# Patient Record
Sex: Male | Born: 1955
Health system: Southern US, Community
[De-identification: ages and names within clinical notes are randomized; demographics above are authoritative.]

## PROBLEM LIST (undated history)

## (undated) DIAGNOSIS — I2510 Atherosclerotic heart disease of native coronary artery without angina pectoris: Secondary | ICD-10-CM

## (undated) DIAGNOSIS — Z973 Presence of spectacles and contact lenses: Secondary | ICD-10-CM

## (undated) DIAGNOSIS — I509 Heart failure, unspecified: Secondary | ICD-10-CM

## (undated) DIAGNOSIS — Z95810 Presence of automatic (implantable) cardiac defibrillator: Secondary | ICD-10-CM

## (undated) DIAGNOSIS — E669 Obesity, unspecified: Secondary | ICD-10-CM

## (undated) DIAGNOSIS — E039 Hypothyroidism, unspecified: Secondary | ICD-10-CM

## (undated) DIAGNOSIS — I272 Pulmonary hypertension, unspecified: Secondary | ICD-10-CM

## (undated) DIAGNOSIS — J189 Pneumonia, unspecified organism: Secondary | ICD-10-CM

## (undated) DIAGNOSIS — I513 Intracardiac thrombosis, not elsewhere classified: Secondary | ICD-10-CM

## (undated) DIAGNOSIS — I5042 Chronic combined systolic (congestive) and diastolic (congestive) heart failure: Secondary | ICD-10-CM

## (undated) DIAGNOSIS — I472 Ventricular tachycardia: Secondary | ICD-10-CM

## (undated) DIAGNOSIS — K08109 Complete loss of teeth, unspecified cause, unspecified class: Secondary | ICD-10-CM

## (undated) DIAGNOSIS — Z972 Presence of dental prosthetic device (complete) (partial): Secondary | ICD-10-CM

## (undated) DIAGNOSIS — I255 Ischemic cardiomyopathy: Secondary | ICD-10-CM

## (undated) DIAGNOSIS — E785 Hyperlipidemia, unspecified: Secondary | ICD-10-CM

## (undated) DIAGNOSIS — K429 Umbilical hernia without obstruction or gangrene: Secondary | ICD-10-CM

## (undated) DIAGNOSIS — I482 Chronic atrial fibrillation, unspecified: Secondary | ICD-10-CM

## (undated) DIAGNOSIS — I4729 Other ventricular tachycardia: Secondary | ICD-10-CM

## (undated) DIAGNOSIS — I340 Nonrheumatic mitral (valve) insufficiency: Secondary | ICD-10-CM

## (undated) DIAGNOSIS — I447 Left bundle-branch block, unspecified: Secondary | ICD-10-CM

## (undated) HISTORY — PX: MULTIPLE TOOTH EXTRACTIONS: SHX2053

## (undated) HISTORY — PX: FRACTURE SURGERY: SHX138

---
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
## 2012-05-20 HISTORY — PX: CORONARY ARTERY BYPASS GRAFT: SHX141

## 2013-03-20 DIAGNOSIS — I5042 Chronic combined systolic (congestive) and diastolic (congestive) heart failure: Secondary | ICD-10-CM

## 2013-03-20 HISTORY — PX: CORONARY ARTERY BYPASS GRAFT: SHX141

## 2013-03-20 HISTORY — PX: CARDIAC CATHETERIZATION: SHX172

## 2013-03-20 HISTORY — DX: Chronic combined systolic (congestive) and diastolic (congestive) heart failure: I50.42

## 2014-03-01 ENCOUNTER — Emergency Department (HOSPITAL_COMMUNITY): Payer: Medicaid Other

## 2014-03-01 ENCOUNTER — Encounter (HOSPITAL_COMMUNITY): Payer: Self-pay | Admitting: Emergency Medicine

## 2014-03-01 ENCOUNTER — Inpatient Hospital Stay (HOSPITAL_COMMUNITY)
Admission: EM | Admit: 2014-03-01 | Discharge: 2014-03-06 | DRG: 292 | Disposition: A | Discharge status: Home / Self Care | Payer: Medicaid Other | Attending: Cardiovascular Disease | Admitting: Cardiovascular Disease

## 2014-03-01 DIAGNOSIS — I2510 Atherosclerotic heart disease of native coronary artery without angina pectoris: Secondary | ICD-10-CM | POA: Diagnosis present

## 2014-03-01 DIAGNOSIS — Z951 Presence of aortocoronary bypass graft: Secondary | ICD-10-CM

## 2014-03-01 DIAGNOSIS — I272 Other secondary pulmonary hypertension: Secondary | ICD-10-CM | POA: Diagnosis present

## 2014-03-01 DIAGNOSIS — I255 Ischemic cardiomyopathy: Secondary | ICD-10-CM | POA: Diagnosis present

## 2014-03-01 DIAGNOSIS — I5189 Other ill-defined heart diseases: Secondary | ICD-10-CM | POA: Diagnosis present

## 2014-03-01 DIAGNOSIS — I4729 Other ventricular tachycardia: Secondary | ICD-10-CM

## 2014-03-01 DIAGNOSIS — R001 Bradycardia, unspecified: Secondary | ICD-10-CM | POA: Diagnosis not present

## 2014-03-01 DIAGNOSIS — I959 Hypotension, unspecified: Secondary | ICD-10-CM | POA: Diagnosis not present

## 2014-03-01 DIAGNOSIS — I447 Left bundle-branch block, unspecified: Secondary | ICD-10-CM | POA: Diagnosis present

## 2014-03-01 DIAGNOSIS — Z7982 Long term (current) use of aspirin: Secondary | ICD-10-CM | POA: Diagnosis not present

## 2014-03-01 DIAGNOSIS — R011 Cardiac murmur, unspecified: Secondary | ICD-10-CM | POA: Diagnosis present

## 2014-03-01 DIAGNOSIS — I5043 Acute on chronic combined systolic (congestive) and diastolic (congestive) heart failure: Principal | ICD-10-CM | POA: Diagnosis present

## 2014-03-01 DIAGNOSIS — Z7901 Long term (current) use of anticoagulants: Secondary | ICD-10-CM

## 2014-03-01 DIAGNOSIS — I472 Ventricular tachycardia: Secondary | ICD-10-CM | POA: Diagnosis present

## 2014-03-01 DIAGNOSIS — F17210 Nicotine dependence, cigarettes, uncomplicated: Secondary | ICD-10-CM | POA: Diagnosis present

## 2014-03-01 DIAGNOSIS — I513 Intracardiac thrombosis, not elsewhere classified: Secondary | ICD-10-CM

## 2014-03-01 DIAGNOSIS — E785 Hyperlipidemia, unspecified: Secondary | ICD-10-CM

## 2014-03-01 DIAGNOSIS — Z720 Tobacco use: Secondary | ICD-10-CM | POA: Diagnosis present

## 2014-03-01 DIAGNOSIS — M7989 Other specified soft tissue disorders: Secondary | ICD-10-CM | POA: Diagnosis present

## 2014-03-01 DIAGNOSIS — R0602 Shortness of breath: Secondary | ICD-10-CM

## 2014-03-01 DIAGNOSIS — I509 Heart failure, unspecified: Secondary | ICD-10-CM

## 2014-03-01 DIAGNOSIS — I236 Thrombosis of atrium, auricular appendage, and ventricle as current complications following acute myocardial infarction: Secondary | ICD-10-CM

## 2014-03-01 DIAGNOSIS — I340 Nonrheumatic mitral (valve) insufficiency: Secondary | ICD-10-CM | POA: Diagnosis present

## 2014-03-01 HISTORY — DX: Left bundle-branch block, unspecified: I44.7

## 2014-03-01 HISTORY — DX: Intracardiac thrombosis, not elsewhere classified: I51.3

## 2014-03-01 HISTORY — DX: Pulmonary hypertension, unspecified: I27.20

## 2014-03-01 HISTORY — DX: Heart failure, unspecified: I50.9

## 2014-03-01 HISTORY — DX: Obesity, unspecified: E66.9

## 2014-03-01 HISTORY — DX: Chronic combined systolic (congestive) and diastolic (congestive) heart failure: I50.42

## 2014-03-01 HISTORY — DX: Hyperlipidemia, unspecified: E78.5

## 2014-03-01 HISTORY — DX: Atherosclerotic heart disease of native coronary artery without angina pectoris: I25.10

## 2014-03-01 HISTORY — DX: Ventricular tachycardia: I47.2

## 2014-03-01 HISTORY — DX: Other disorders of bilirubin metabolism: E80.6

## 2014-03-01 HISTORY — DX: Other ventricular tachycardia: I47.29

## 2014-03-01 HISTORY — DX: Ischemic cardiomyopathy: I25.5

## 2014-03-01 HISTORY — DX: Nonrheumatic mitral (valve) insufficiency: I34.0

## 2014-03-01 HISTORY — DX: Umbilical hernia without obstruction or gangrene: K42.9

## 2014-03-01 LAB — COMPREHENSIVE METABOLIC PANEL
ALBUMIN: 2.9 g/dL — AB (ref 3.5–5.2)
ALK PHOS: 104 U/L (ref 39–117)
ALT: 21 U/L (ref 0–53)
AST: 33 U/L (ref 0–37)
Anion gap: 10 (ref 5–15)
BUN: 17 mg/dL (ref 6–23)
CHLORIDE: 100 meq/L (ref 96–112)
CO2: 26 mEq/L (ref 19–32)
Calcium: 8.9 mg/dL (ref 8.4–10.5)
Creatinine, Ser: 0.91 mg/dL (ref 0.50–1.35)
GFR calc Af Amer: >90 mL/min (ref >90)
Glucose, Bld: 196 mg/dL — ABNORMAL HIGH (ref 70–99)
Potassium: 4.4 mEq/L (ref 3.7–5.3)
SODIUM: 136 meq/L — AB (ref 137–147)
Total Bilirubin: 1.7 mg/dL — ABNORMAL HIGH (ref 0.3–1.2)
Total Protein: 7.5 g/dL (ref 6.0–8.3)

## 2014-03-01 LAB — CBC
HCT: 46.8 % (ref 39.0–52.0)
HEMATOCRIT: 49 % (ref 39.0–52.0)
HEMOGLOBIN: 15.7 g/dL (ref 13.0–17.0)
Hemoglobin: 15.3 g/dL (ref 13.0–17.0)
MCH: 28.9 pg (ref 26.0–34.0)
MCH: 29.1 pg (ref 26.0–34.0)
MCHC: 32 g/dL (ref 30.0–36.0)
MCHC: 32.7 g/dL (ref 30.0–36.0)
MCV: 90.2 fL (ref 78.0–100.0)
MCV: 89 fL (ref 78.0–100.0)
PLATELETS: 142 10*3/uL — AB (ref 150–400)
Platelets: 145 10*3/uL — ABNORMAL LOW (ref 150–400)
RBC: 5.43 MIL/uL (ref 4.22–5.81)
RBC: 5.26 MIL/uL (ref 4.22–5.81)
RDW: 17.4 % — ABNORMAL HIGH (ref 11.5–15.5)
RDW: 17.2 % — AB (ref 11.5–15.5)
WBC: 8.4 10*3/uL (ref 4.0–10.5)
WBC: 9 10*3/uL (ref 4.0–10.5)

## 2014-03-01 LAB — CREATININE, SERUM
Creatinine, Ser: 0.97 mg/dL (ref 0.50–1.35)
GFR calc non Af Amer: 89 mL/min — ABNORMAL LOW (ref >90)

## 2014-03-01 LAB — I-STAT TROPONIN, ED: Troponin i, poc: 0.04 ng/mL (ref 0.00–0.08)

## 2014-03-01 LAB — TROPONIN I

## 2014-03-01 LAB — PRO B NATRIURETIC PEPTIDE: Pro B Natriuretic peptide (BNP): 1789 pg/mL — ABNORMAL HIGH (ref 0–125)

## 2014-03-01 LAB — POTASSIUM: Potassium: 3.6 mEq/L — ABNORMAL LOW (ref 3.7–5.3)

## 2014-03-01 MED ORDER — SODIUM CHLORIDE 0.9 % IV SOLN: 250.0000 mL | INTRAVENOUS | Status: DC | PRN

## 2014-03-01 MED ORDER — HEPARIN SODIUM (PORCINE) 5000 UNIT/ML IJ SOLN
5000.0000 [IU] | Freq: Three times a day (TID) | INTRAMUSCULAR | Status: DC
  Administered 2014-03-01 – 2014-03-03 (×8): 5000 [IU] via SUBCUTANEOUS
  Filled 2014-03-01 (×10): qty 1

## 2014-03-01 MED ORDER — ATORVASTATIN CALCIUM 40 MG PO TABS
40.0000 mg | ORAL_TABLET | Freq: Every day | ORAL | Status: DC
  Administered 2014-03-01 – 2014-03-05 (×5): 40 mg via ORAL
  Filled 2014-03-01 (×7): qty 1

## 2014-03-01 MED ORDER — DEXTROSE 5 % IV SOLN
5.0000 mg/h | INTRAVENOUS | Status: DC
  Administered 2014-03-01: 8 mg/h via INTRAVENOUS
  Administered 2014-03-02: 5 mg/h via INTRAVENOUS
  Filled 2014-03-01 (×5): qty 25

## 2014-03-01 MED ORDER — ACETAMINOPHEN 325 MG PO TABS: 650.0000 mg | ORAL_TABLET | ORAL | Status: DC | PRN

## 2014-03-01 MED ORDER — SODIUM CHLORIDE 0.9 % IJ SOLN: 3.0000 mL | INTRAMUSCULAR | Status: DC | PRN

## 2014-03-01 MED ORDER — LISINOPRIL 2.5 MG PO TABS
2.5000 mg | ORAL_TABLET | Freq: Every day | ORAL | Status: DC
  Administered 2014-03-01 – 2014-03-02 (×2): 2.5 mg via ORAL
  Filled 2014-03-01 (×4): qty 1

## 2014-03-01 MED ORDER — ONDANSETRON HCL 4 MG/2ML IJ SOLN: 4.0000 mg | Freq: Four times a day (QID) | INTRAMUSCULAR | Status: DC | PRN

## 2014-03-01 MED ORDER — CARVEDILOL 3.125 MG PO TABS
3.1250 mg | ORAL_TABLET | Freq: Two times a day (BID) | ORAL | Status: DC
  Administered 2014-03-01 – 2014-03-06 (×8): 3.125 mg via ORAL
  Filled 2014-03-01 (×14): qty 1

## 2014-03-01 MED ORDER — POTASSIUM CHLORIDE CRYS ER 20 MEQ PO TBCR
20.0000 meq | EXTENDED_RELEASE_TABLET | Freq: Two times a day (BID) | ORAL | Status: DC
  Administered 2014-03-01 – 2014-03-03 (×6): 20 meq via ORAL
  Filled 2014-03-01 (×8): qty 1

## 2014-03-01 MED ORDER — ASPIRIN EC 81 MG PO TBEC
81.0000 mg | DELAYED_RELEASE_TABLET | Freq: Every day | ORAL | Status: DC
  Administered 2014-03-01 – 2014-03-06 (×5): 81 mg via ORAL
  Filled 2014-03-01 (×7): qty 1

## 2014-03-01 MED ORDER — FUROSEMIDE 10 MG/ML IJ SOLN
80.0000 mg | Freq: Once | INTRAMUSCULAR | Status: AC
  Administered 2014-03-01: 80 mg via INTRAVENOUS
  Filled 2014-03-01: qty 8

## 2014-03-01 MED ORDER — SODIUM CHLORIDE 0.9 % IJ SOLN
3.0000 mL | Freq: Two times a day (BID) | INTRAMUSCULAR | Status: DC
  Administered 2014-03-01 – 2014-03-06 (×5): 3 mL via INTRAVENOUS

## 2014-03-01 NOTE — ED Notes (Signed)
Pt to vascular.

## 2014-03-01 NOTE — ED Notes (Signed)
Pt recently moved here from [place]. Hx of CHF and CABG. Pt has increase in SOB and bilateral leg swelling for the past 2-3 weeks. Denies any pain.

## 2014-03-01 NOTE — ED Provider Notes (Addendum)
CSN: [id number]     Arrival date & time 03/01/14  [id number] History   First MD Initiated Contact with Patient 03/01/14 [phone]     Chief Complaint  Patient presents with  . Leg Swelling  . Shortness of Breath      HPI Patient underwent coronary artery bypass grafting in [place] November 2014.  He presents now with worsening exertional shortness of breath and associated orthopnea as well as bilateral lower extremity edema.  He is not currently on any medications.  Shortly [place] his [place] and has not established care with a primary care physician or cardiologist.  He states over the past several weeks his breathing has become worse and his leg swelling has become worse.  He states last night he had to sleep upright secondary to shortness of breath.  His daughter reports that he sounds labored when speaking with him on the phone.  His symptoms are moderate to severe in severity.  No fevers or chills.  No productive cough.  No history of DVT or pulmonary embolism   Past Medical History  Diagnosis Date  . CHF (congestive heart failure)   . Obesity   . Pulmonary hypertension   . Hyperbilirubinemia    Past Surgical History  Procedure Laterality Date  . Coronary artery bypass graft     No family history on file. History  Substance Use Topics  . Smoking status: Current Some Day Smoker  . Smokeless tobacco: Not on file  . Alcohol Use: No    Review of Systems  All other systems reviewed and are negative.     Allergies  Review of patient's allergies indicates no known allergies.  Home Medications   Prior to Admission medications   Medication Sig Start Date End Date Taking? Authorizing Provider  aspirin EC 81 MG tablet Take 81 mg by mouth daily.   Yes Historical Provider, MD  Multiple Vitamins-Minerals (HAIR/SKIN/NAILS PO) Take 1 tablet by mouth daily.   Yes Historical Provider, MD  VITAMIN E PO Take 1 tablet by mouth daily.   Yes Historical Provider, MD   BP  131/88  Pulse 93  Temp(Src) 97.4 F (36.3 C) (Oral)  Resp 24  Ht 5\' 10"  (1.778 m)  Wt 220 lb (99.791 kg)  BMI 31.57 kg/m2  SpO2 97% Physical Exam  Nursing note and vitals reviewed. Constitutional: He is oriented to person, place, and time. He appears well-developed and well-nourished.  HENT:  Head: Normocephalic and atraumatic.  Eyes: EOM are normal.  Neck: Normal range of motion.  Cardiovascular: Normal rate, regular rhythm, normal heart sounds and intact distal pulses.   Pulmonary/Chest: Effort normal and breath sounds normal. No respiratory distress.  Abdominal: Soft. He exhibits no distension. There is no tenderness.  Easily reducible periumbilical hernia.  Musculoskeletal: Normal range of motion.  2+ pitting edema bilaterally.  Normal pulses in bilateral feet  Neurological: He is alert and oriented to person, place, and time.  Skin: Skin is warm and dry.  Psychiatric: He has a normal mood and affect. Judgment normal.    ED Course  Procedures (including critical care time) Labs Review Labs Reviewed  CBC - Abnormal; Notable for the following:    RDW 17.4 (*)    Platelets 142 (*)    All other components within normal limits  PRO B NATRIURETIC PEPTIDE - Abnormal; Notable for the following:    Pro B Natriuretic peptide (BNP) 1789.0 (*)    All other components within normal limits  COMPREHENSIVE METABOLIC PANEL - Abnormal; Notable for the following:    Sodium 136 (*)    Glucose, Bld 196 (*)    Albumin 2.9 (*)    Total Bilirubin 1.7 (*)    All other components within normal limits  TROPONIN I  Rosezena SensorI-STAT TROPOININ, ED    Imaging Review Dg Chest 2 View  03/01/2014   CLINICAL DATA:  58 year old male with acute shortness of breath and bilateral lower extremity swelling. Initial encounter. Personal history of 2014 CABG.  EXAM: CHEST  2 VIEW  COMPARISON:  None.  FINDINGS: Cardiomegaly. Small bilateral pleural effusions. Pulmonary vascular congestion with suggestion of Kerley  B-lines. No pneumothorax or consolidation. Large lung volumes. Visualized tracheal air column is within normal limits. No acute osseous abnormality identified.  IMPRESSION: Cardiomegaly with small pleural effusions and interstitial edema.   Electronically Signed   By: Augusto GambleLee  Hall M.D.   On: 03/01/2014 09:34     EKG Interpretation   Date/Time:  Tuesday March 01 2014 08:59:17 EDT Ventricular Rate:  95 PR Interval:  182 QRS Duration: 122 QT Interval:  416 QTC Calculation: 522 R Axis:   158 Text Interpretation:  Undetermined rhythm Septal infarct , age  undetermined Lateral infarct , age undetermined T wave abnormality,  consider inferior ischemia Abnormal ECG No old tracing to compare repeat  ecg necessary given artifact Confirmed by Lyndy Russman  MD, Caryn BeeKEVIN ([id number]) on  03/01/2014 9:26:19 AM       ECG interpretation 1017   Date: 03/01/2014  Rate: 81  Rhythm: normal sinus rhythm  QRS Axis: normal  Intervals: normal  ST/T Wave abnormalities: Nonspecific ST and T wave changes  Conduction Disutrbances:LBBB  Narrative Interpretation:   Old EKG Reviewed: no prior ecg available     MDM   Final diagnoses:  Acute on chronic congestive heart failure, unspecified congestive heart failure type    Paperwork in the patient's room demonstrate echocardiogram from November 2014 demonstrating severe hypokinesis and an EF of 19%.  There is also report of cirrhosis and ascites based on abdominal ultrasound.  Patient does not drink alcohol.  Patient continues to smoke cigarettes.  I suspect this is acute on chronic congestive heart failure.  Cardiology consult.    Lyanne CoKevin M Berneice Zettlemoyer, MD 03/01/14 1014  Lyanne CoKevin M Gray Maugeri, MD 03/01/14 1051

## 2014-03-01 NOTE — Progress Notes (Signed)
*  PRELIMINARY RESULTS* Vascular Ultrasound Lower extremity venous duplex has been completed.  Preliminary findings: no evidence of DVT  Farrel DemarkJill Eunice, RDMS, RVT  03/01/2014, 11:54 AM

## 2014-03-01 NOTE — Care Management Note (Addendum)
    Page 1 of 2   03/05/2014     10:23:49 AM CARE MANAGEMENT NOTE 03/05/2014  Patient:  Joseph Carson,Joseph Carson   Account Number:  [id number]  Date Initiated:  03/01/2014  Documentation initiated by:  Fuller Mandril  Subjective/Objective Assessment:   58 yo male presents with shortness of breath, orthopnea, lower extremity edema.     Action/Plan:   furosemide (LASIX) 250 mg in dextrose 5 % 250 mL (1 mg/mL) infusion//Access for disposition needs   Anticipated DC Date:  03/06/2014   Anticipated DC Plan:  [place]  CM consult  Medication Assistance  [place] Program      Choice offered to / List presented to:             Status of service:  Completed, signed off Medicare Important Message given?   (If response is "NO", the following Medicare IM given date fields will be blank) Date Medicare IM given:   Medicare IM given by:   Date Additional Medicare IM given:   Additional Medicare IM given by:    Discharge Disposition:  HOME/SELF CARE  Per UR Regulation:  Reviewed for med. necessity/level of care/duration of stay  If discussed at [place] of Stay Meetings, dates discussed:    Comments:  03/05/14 10:10 CM met with pt in room to give him [facility] letter and list of participating pharmacies.Pt verbalized understanding of MATCH parameters.  CM also gave pt [place] pamphlet and pt verbalized understanding he is to go to the CLINIC any weekday morning from 9-10 and ask for: AN APPOINTMENT OF FOR A PCP; AN APPOINTMENT WITH A NAVIGATOR FOR INSURANCE/MEDICAID; and AN APPOINTMENT FOR FOLLOW UP CARE.  No other CM needs were communicated.  Mariane Masters, BSN, [place] [phone].  03/04/14 [place], RN, BSN, [place] [phone] Hand-off to weekend NCM to provide [facility] letter on day of discharge.  03/01/14 [place], RN, BSN, [place] [phone] 58 yo male who moved from [place] about 10 months ago to be closer to family.  He works bending tubes for neon signs. He  has not had any insurance for several months and when he ran out of his medications in Feb, he never got any more.

## 2014-03-01 NOTE — ED Notes (Signed)
Pt remains in xray.

## 2014-03-01 NOTE — H&P (Signed)
Joseph Carson is an 59 y.o. male.   Chief Complaint:  Leg swelling HPI:   The patient is a 58 yo male who moved from [place] about 10 months ago to be closer to family.  He works bending tubes for neon signs.  He has not had any insurance for several months and when he ran out of his medications in Feb, he never got any more.  Has prior history of noncompliance as well.  He has a history of CAD with CABG x 3 in November 2014 (LIMA to the LAD, SVG to the ramus intermedius, SVG to the PDA), ischemic cardiomyopathy with an EF of 19% by echo 03/22/13,  CHF, obesity, pulmonary HTN, hyperbilirubinemia, left knee surgery.    His cath report 03/24/2013 from [facility]:  left main with 70% distal stenosis, LAD 100% stenosis after the origin of a widely patent first diagonal, 100% stenosis in the proximal left circumflex , 100 percent stenosis in the proximal right coronary artery.  There was also a large, widely patent vessel arising from the right coronary sinus that may represent a right ventricular branch supplying collateral circulation to the LAD.  He presents with shortness of breath, orthopnea, lower extremity edema.  He currently denies nausea, vomiting, fever, chest pain, dizziness, cough, congestion, abdominal pain, hematochezia, melena, claudication.   Past Medical History  Diagnosis Date  . CHF (congestive heart failure)   . Obesity   . Pulmonary hypertension   . Hyperbilirubinemia     Past Surgical History  Procedure Laterality Date  . Coronary artery bypass graft      No family history on file. Social History:  reports that he has been smoking.  He does not have any smokeless tobacco history on file. He reports that he does not drink alcohol or use illicit drugs.  Allergies: No Known Allergies   (Not in a hospital admission)  Results for orders placed during the hospital encounter of 03/01/14 (from the past 48 hour(s))  CBC     Status: Abnormal   Collection Time      03/01/14  9:14 AM      Result Value Ref Range   WBC 8.4  4.0 - 10.5 K/uL   RBC 5.43  4.22 - 5.81 MIL/uL   Hemoglobin 15.7  13.0 - 17.0 g/dL   HCT 49.0  39.0 - 52.0 %   MCV 90.2  78.0 - 100.0 fL   MCH 28.9  26.0 - 34.0 pg   MCHC 32.0  30.0 - 36.0 g/dL   RDW 17.4 (*) 11.5 - 15.5 %   Platelets 142 (*) 150 - 400 K/uL  PRO B NATRIURETIC PEPTIDE     Status: Abnormal   Collection Time    03/01/14  9:14 AM      Result Value Ref Range   Pro B Natriuretic peptide (BNP) 1789.0 (*) 0 - 125 pg/mL  TROPONIN I     Status: None   Collection Time    03/01/14  9:14 AM      Result Value Ref Range   Troponin I <0.30  <0.30 ng/mL   Comment:            Due to the release kinetics of cTnI,     a negative result within the first hours     of the onset of symptoms does not rule out     myocardial infarction with certainty.     If myocardial infarction is still suspected,  repeat the test at appropriate intervals.  COMPREHENSIVE METABOLIC PANEL     Status: Abnormal   Collection Time    03/01/14  9:14 AM      Result Value Ref Range   Sodium 136 (*) 137 - 147 mEq/L   Potassium 4.4  3.7 - 5.3 mEq/L   Chloride 100  96 - 112 mEq/L   CO2 26  19 - 32 mEq/L   Glucose, Bld 196 (*) 70 - 99 mg/dL   BUN 17  6 - 23 mg/dL   Creatinine, Ser 0.91  0.50 - 1.35 mg/dL   Calcium 8.9  8.4 - 10.5 mg/dL   Total Protein 7.5  6.0 - 8.3 g/dL   Albumin 2.9 (*) 3.5 - 5.2 g/dL   AST 33  0 - 37 U/L   ALT 21  0 - 53 U/L   Alkaline Phosphatase 104  39 - 117 U/L   Total Bilirubin 1.7 (*) 0.3 - 1.2 mg/dL   GFR calc non Af Amer >90  >90 mL/min   GFR calc Af Amer >90  >90 mL/min   Comment: (NOTE)     The eGFR has been calculated using the CKD EPI equation.     This calculation has not been validated in all clinical situations.     eGFR's persistently <90 mL/min signify possible Chronic Kidney     Disease.   Anion gap 10  5 - 15  I-STAT TROPOININ, ED     Status: None   Collection Time    03/01/14  9:30 AM       Result Value Ref Range   Troponin i, poc 0.04  0.00 - 0.08 ng/mL   Comment 3            Comment: Due to the release kinetics of cTnI,     a negative result within the first hours     of the onset of symptoms does not rule out     myocardial infarction with certainty.     If myocardial infarction is still suspected,     repeat the test at appropriate intervals.   Dg Chest 2 View  03/01/2014   CLINICAL DATA:  58-year-old male with acute shortness of breath and bilateral lower extremity swelling. Initial encounter. Personal history of 2014 CABG.  EXAM: CHEST  2 VIEW  COMPARISON:  None.  FINDINGS: Cardiomegaly. Small bilateral pleural effusions. Pulmonary vascular congestion with suggestion of Kerley B-lines. No pneumothorax or consolidation. Large lung volumes. Visualized tracheal air column is within normal limits. No acute osseous abnormality identified.  IMPRESSION: Cardiomegaly with small pleural effusions and interstitial edema.   Electronically Signed   By: Lee  Hall M.D.   On: 03/01/2014 09:34    Review of Systems  Constitutional: Negative for fever and diaphoresis.  HENT: Negative for congestion and sore throat.   Respiratory: Positive for shortness of breath. Negative for cough and wheezing.   Cardiovascular: Positive for orthopnea, leg swelling and PND. Negative for chest pain, palpitations and claudication.  Gastrointestinal: Negative for nausea, vomiting, abdominal pain, blood in stool and melena.  Genitourinary: Negative for hematuria.  Musculoskeletal: Negative for myalgias.  Neurological: Negative for dizziness.  All other systems reviewed and are negative.   Blood pressure 107/68, pulse 80, temperature 97.4 F (36.3 C), temperature source Oral, resp. rate 16, height 5' 10" (1.778 m), weight 220 lb (99.791 kg), SpO2 97.00%. Physical Exam  Nursing note and vitals reviewed. Constitutional: He is oriented to person, place, and time.   He appears well-developed and  well-nourished. No distress.  HENT:  Head: Normocephalic and atraumatic.  Mouth/Throat: No oropharyngeal exudate.  Eyes: EOM are normal. Pupils are equal, round, and reactive to light. No scleral icterus.  Neck: Normal range of motion. Neck supple. JVD present.  Cardiovascular: Normal rate, regular rhythm, S1 normal and S2 normal.   No murmur heard. Pulses:      Radial pulses are 2+ on the right side, and 2+ on the left side.       Dorsalis pedis pulses are 2+ on the right side, and 2+ on the left side.  No carotid bruits.  Respiratory: Effort normal. He has wheezes (right side). He has rales.  GI: Soft. Bowel sounds are normal. He exhibits distension. There is no tenderness.  Musculoskeletal: He exhibits edema.  Anasarca  Lymphadenopathy:    He has no cervical adenopathy.  Neurological: He is alert and oriented to person, place, and time. He exhibits normal muscle tone.  Skin: Skin is warm and dry.  Psychiatric: He has a normal mood and affect.     Assessment/Plan Principal Problem:   Acute on chronic combined systolic and diastolic heart failure Active Problems:   Coronary artery disease   Ischemic cardiomyopathy ef 19%   Tobacco abuse   Pulmonary hypertension   Status post coronary artery bypass grafting x3, November 2014  Plan:  Patient will be admitted to telemetry. He is likely 30-40 pounds overweight volume. We will start him on Lasix drip. We'll add low dose ACE inhibitor 2.5 mg. Coreg 3.125 twice daily.  Continue to monitor blood pressure. We'll also add twice daily potassium. Check 2-D echocardiogram. Consider adding spironolactone if blood pressure will tolerate.    Tarri Fuller, [place] 03/01/2014, 12:26 PM   Patient seen and examined. Agree with assessment and plan.  Joseph Carson is a 58 year old Caucasian male with a history of ischemic cardiomyopathy initially documented in November 2014.  At that time, and he had an echo Doppler study, which revealed an ejection  fraction of 19% , and due to the severe CAD with 70% left main stenosis, total occlusion of the LAD after the diagonal vessel, total occlusion of the circumflex and right coronary artery he underwent CABG revascularization surgery x3 with LIMA to the LAD, vein graft to the ramus intermediate, and vein graft to the PDA.  Approximately one month after his surgery he moved to [place] and consequently did not have cardiovascular followup.  He had initially taken medications but ran out of all his medications in March.  He had not yet established care locally.  For the past 3-4 weeks he has noticed progressive increasing lower extremity edema and also has noticed exertional shortness of breath.  He denies recurrent chest tightness.  He presented to the emergency room today and is found to be in volume overload with acute on chronic systolic and probable diastolic heart failure.  Physical examination reveals mild JVD elevation at 8 cm.  He had decreased breath sounds in his lungs bilaterally.  Rhythm was regular with a rate in the 65H with 1/6 systolic murmur and possible S3 gallop.  He does have a moderate size umbilical hernia.  He has 3-4+ lower extremity edema.  Laboratory is notable for an elevated pro BNP at 1789.  Renal function is normal with a BUN of 17, creatinine 0.91.  Initial troponin is negative.  He is not anemic.  ECG reveals sinus rhythm with borderline left bundle branch block.  He has poor anterior  R-wave progression.  Joseph Carson will be admitted.  We will start him on carvedilol 3.125 twice a day, as well as low-dose ACE inhibition.  He will most likely be need additional' blockade with spironolactone, but will not start this presently since his  blood pressure is low.  A 2-D echo Doppler study will be performed.  He will need reassessment of LV function following revascularization, and  will also need future reassessment of LV function following optimization of medical therapy for his  congestive heart myopathy.  If he continues to have poor LV function on optimal medical therapy, consideration for implantable cardio defibrillator will be necessary.  We will initiate statin therapy for his ischemic heart disease.   Troy Sine, MD, [facility] 03/01/2014 1:25 PM

## 2014-03-02 DIAGNOSIS — I059 Rheumatic mitral valve disease, unspecified: Secondary | ICD-10-CM

## 2014-03-02 LAB — LIPID PANEL
Cholesterol: 119 mg/dL (ref 0–200)
HDL: 33 mg/dL — AB (ref >39)
LDL Cholesterol: 73 mg/dL (ref 0–99)
Total CHOL/HDL Ratio: 3.6 RATIO
Triglycerides: 63 mg/dL (ref <150)
VLDL: 13 mg/dL (ref 0–40)

## 2014-03-02 LAB — BASIC METABOLIC PANEL
ANION GAP: 10 (ref 5–15)
BUN: 15 mg/dL (ref 6–23)
CHLORIDE: 100 meq/L (ref 96–112)
CO2: 34 meq/L — AB (ref 19–32)
CREATININE: 1.1 mg/dL (ref 0.50–1.35)
Calcium: 9.3 mg/dL (ref 8.4–10.5)
GFR calc Af Amer: 84 mL/min — ABNORMAL LOW (ref >90)
GFR calc non Af Amer: 72 mL/min — ABNORMAL LOW (ref >90)
GLUCOSE: 87 mg/dL (ref 70–99)
Potassium: 4.3 mEq/L (ref 3.7–5.3)
Sodium: 144 mEq/L (ref 137–147)

## 2014-03-02 MED ORDER — DEXTROSE 5 % IV SOLN
2.5000 mg/h | INTRAVENOUS | Status: DC
  Administered 2014-03-03: 2.5 mg/h via INTRAVENOUS
  Filled 2014-03-02: qty 25

## 2014-03-02 MED ORDER — SPIRONOLACTONE 12.5 MG HALF TABLET
12.5000 mg | ORAL_TABLET | Freq: Every day | ORAL | Status: DC
  Administered 2014-03-02: 12.5 mg via ORAL
  Filled 2014-03-02 (×2): qty 1

## 2014-03-02 NOTE — Progress Notes (Signed)
Patient ID: Joseph Carson, male   DOB: 01/12/1956, 58 y.o.   MRN: [id number]   SUBJECTIVE: Patient [place] CAB[place] in [place]14 [place], EF 19% by echo at that time.  Has not had followup since then with cardiology.  He was admitted yesterday with CHF exacerbation (dyspnea, orthopnea).  No chest pain.    Yesterday he was started on Lasix gtt at 8 mg/hr. He has diuresed very vigorously, weight is down 15 lbs.  Breathing is much better today.   Scheduled Meds: . aspirin EC  81 mg Oral Daily  . atorvastatin  40 mg Oral q1800  . carvedilol  3.125 mg Oral BID WC  . heparin  5,000 Units Subcutaneous 3 times per day  . lisinopril  2.5 mg Oral Daily  . potassium chloride  20 mEq Oral BID  . sodium chloride  3 mL Intravenous Q12H  . spironolactone  12.5 mg Oral Daily   Continuous Infusions: . furosemide (LASIX) infusion 8 mg/hr (03/01/14 1616)   PRN Meds:.sodium chloride, acetaminophen, ondansetron (ZOFRAN) IV, sodium chloride    Filed Vitals:   03/01/14 1513 03/01/14 2115 03/02/14 0113 03/02/14 0513  BP: 129/77 104/69 102/66 104/66  Pulse: 87 65 69 71  Temp:  97.4 F (36.3 C) 97.5 F (36.4 C) 97.6 F (36.4 C)  TempSrc:  Oral Oral Oral  Resp:  18 18 18   Height: 5\' 10"  (1.778 m)     Weight: 230 lb 13.2 oz (104.7 kg)   215 lb 8 oz (97.75 kg)  SpO2: 98% 95% 96% 93%    Intake/Output Summary (Last 24 hours) at 03/02/14 0840 Last data filed at 03/02/14 0815  Gross per 24 hour  Intake    400 ml  Output  [id number] ml  Net -14045 ml    LABS: Basic Metabolic Panel:  Recent Labs  19/14/7810/13/15 0914 03/01/14 1554 03/01/14 1851 03/02/14 0400  NA 136*  --   --  144  K 4.4  --  3.6* 4.3  CL 100  --   --  100  CO2 26  --   --  34*  GLUCOSE 196*  --   --  87  BUN 17  --   --  15  CREATININE 0.91 0.97  --  1.10  CALCIUM 8.9  --   --  9.3   Liver Function Tests:  Recent Labs  03/01/14 0914  AST 33  ALT 21  ALKPHOS 104  BILITOT 1.7*  PROT 7.5  ALBUMIN 2.9*   No results found for this  basename: LIPASE, AMYLASE,  in the last 72 hours CBC:  Recent Labs  03/01/14 0914 03/01/14 1554  WBC 8.4 9.0  HGB 15.7 15.3  HCT 49.0 46.8  MCV 90.2 89.0  PLT 142* 145*   Cardiac Enzymes:  Recent Labs  03/01/14 0914  TROPONINI <0.30   BNP: No components found with this basename: POCBNP,  D-Dimer: No results found for this basename: DDIMER,  in the last 72 hours Hemoglobin A1C: No results found for this basename: HGBA1C,  in the last 72 hours Fasting Lipid Panel:  Recent Labs  03/02/14 0400  CHOL 119  HDL 33*  LDLCALC 73  TRIG 63  CHOLHDL 3.6   Thyroid Function Tests: No results found for this basename: TSH, T4TOTAL, FREET3, T3FREE, THYROIDAB,  in the last 72 hours Anemia Panel: No results found for this basename: VITAMINB12, FOLATE, FERRITIN, TIBC, IRON, RETICCTPCT,  in the last 72 hours  RADIOLOGY: Dg Chest 2 View  03/01/2014   CLINICAL DATA:  58 year old male with acute shortness of breath and bilateral lower extremity swelling. Initial encounter. Personal history of 2014 CABG.  EXAM: CHEST  2 VIEW  COMPARISON:  None.  FINDINGS: Cardiomegaly. Small bilateral pleural effusions. Pulmonary vascular congestion with suggestion of Kerley B-lines. No pneumothorax or consolidation. Large lung volumes. Visualized tracheal air column is within normal limits. No acute osseous abnormality identified.  IMPRESSION: Cardiomegaly with small pleural effusions and interstitial edema.   Electronically Signed   By: Augusto GambleLee  Hall M.D.   On: 03/01/2014 09:34    PHYSICAL EXAM General: NAD Neck: JVP 9-10 cm, no thyromegaly or thyroid nodule.  Lungs: Clear to auscultation bilaterally with normal respiratory effort. CV: Nondisplaced PMI.  Heart regular S1/S2, soft S3, no murmur.  2+ edema to knees bilaterally.   Abdomen: Soft, nontender, no hepatosplenomegaly, no distention.  Neurologic: Alert and oriented x 3.  Psych: Normal affect. Extremities: No clubbing or cyanosis.   TELEMETRY:  Reviewed telemetry pt in NSR  ASSESSMENT AND PLAN: 58 yo with CAD s/p CABG 11/14 and ischemic cardiomyopathy presented with acute on chronic systolic CHF and massive volume overload.  1. Acute on chronic systolic CHF: EF 16%19% by echo in [place] in 11/14, no followup since then. Gradually built up massive fluid accumulation.  Diuresed very well yesterday, weight down 15 lbs.   - Would decrease Lasix to 5 mg/hr for a little slower diuresis.  However, he still needs fluid off.  - Continue Coreg and lisinopril at current doses.  - Add spironolactone 12.5 mg daily.  - Needs echo. - If EF remains low, will be candidate for CRT-D as he has LBBB QRS 135 msec.  Has not been on med therapy at home, so will likely need 3 months of med tx prior to repeat echo before device implantation.  2. CAD: s/p CABG.  No chest pain, doubt ACS.  Continue ASA, add back statin.  3. Smoking: Still 2-3 cigs/day, very heavy before that.  Encouraged him to stop completely.   Marca AnconaDalton Earleen Aoun 03/02/2014 8:45 AM

## 2014-03-02 NOTE — Progress Notes (Signed)
Patient Name: Joseph SiresMichael Carson Date of Encounter: 03/02/2014     Principal Problem:   Acute on chronic combined systolic and diastolic heart failure Active Problems:   Coronary artery disease   Ischemic cardiomyopathy ef 19%   Tobacco abuse   Pulmonary hypertension   Status post coronary artery bypass grafting x3, November 2014    SUBJECTIVE  Feeling much less SOB. No CP. LE still with massive volume overload.  CURRENT MEDS . aspirin EC  81 mg Oral Daily  . atorvastatin  40 mg Oral q1800  . carvedilol  3.125 mg Oral BID WC  . heparin  5,000 Units Subcutaneous 3 times per day  . lisinopril  2.5 mg Oral Daily  . potassium chloride  20 mEq Oral BID  . sodium chloride  3 mL Intravenous Q12H  . spironolactone  12.5 mg Oral Daily    OBJECTIVE  Filed Vitals:   03/01/14 1513 03/01/14 2115 03/02/14 0113 03/02/14 0513  BP: 129/77 104/69 102/66 104/66  Pulse: 87 65 69 71  Temp:  97.4 F (36.3 C) 97.5 F (36.4 C) 97.6 F (36.4 C)  TempSrc:  Oral Oral Oral  Resp:  18 18 18   Height: 5\' 10"  (1.778 m)     Weight: 230 lb 13.2 oz (104.7 kg)   215 lb 8 oz (97.75 kg)  SpO2: 98% 95% 96% 93%    Intake/Output Summary (Last 24 hours) at 03/02/14 1319 Last data filed at 03/02/14 1247  Gross per 24 hour  Intake    640 ml  Output  [id number] ml  Net -12455 ml   Filed Weights   03/01/14 0859 03/01/14 1513 03/02/14 0513  Weight: 220 lb (99.791 kg) 230 lb 13.2 oz (104.7 kg) 215 lb 8 oz (97.75 kg)    PHYSICAL EXAM  General: NAD  Neck: JVP 9-10 cm, no thyromegaly or thyroid nodule.  Lungs: Clear to auscultation bilaterally with normal respiratory effort.  CV: Nondisplaced PMI. Heart regular S1/S2, soft S3, no murmur. 2+ edema to knees bilaterally.  Abdomen: Soft, nontender, no hepatosplenomegaly, no distention.  Neurologic: Alert and oriented x 3.  Psych: Normal affect.  Extremities: No clubbing or cyanosis.    Accessory Clinical Findings  CBC  Recent Labs  03/01/14 0914  03/01/14 1554  WBC 8.4 9.0  HGB 15.7 15.3  HCT 49.0 46.8  MCV 90.2 89.0  PLT 142* 145*   Basic Metabolic Panel  Recent Labs  03/01/14 0914 03/01/14 1554 03/01/14 1851 03/02/14 0400  NA 136*  --   --  144  K 4.4  --  3.6* 4.3  CL 100  --   --  100  CO2 26  --   --  34*  GLUCOSE 196*  --   --  87  BUN 17  --   --  15  CREATININE 0.91 0.97  --  1.10  CALCIUM 8.9  --   --  9.3   Liver Function Tests  Recent Labs  03/01/14 0914  AST 33  ALT 21  ALKPHOS 104  BILITOT 1.7*  PROT 7.5  ALBUMIN 2.9*    Cardiac Enzymes  Recent Labs  03/01/14 0914  TROPONINI <0.30   Fasting Lipid Panel  Recent Labs  03/02/14 0400  CHOL 119  HDL 33*  LDLCALC 73  TRIG 63  CHOLHDL 3.6     TELE  LBBB, sinus. Freq PVCs  Radiology/Studies  Dg Chest 2 View  03/01/2014   CLINICAL DATA:  58 year old male with acute shortness  of breath and bilateral lower extremity swelling. Initial encounter. Personal history of 2014 CABG.  EXAM: CHEST  2 VIEW  COMPARISON:  None.  FINDINGS: Cardiomegaly. Small bilateral pleural effusions. Pulmonary vascular congestion with suggestion of Kerley B-lines. No pneumothorax or consolidation. Large lung volumes. Visualized tracheal air column is within normal limits. No acute osseous abnormality identified.  IMPRESSION: Cardiomegaly with small pleural effusions and interstitial edema.       2D ECHO 03/02/2014 LV EF: 20% Study Conclusions - Left ventricle: e/E&'>21 suggestive of elevated LV filling pressures. The cavity size was severely dilated. Systolic function was severely reduced. The estimated ejection fraction was 20%. Diffuse hypokinesis. Akinesis of the entireapical myocardium. - Ventricular septum: Septal motion showed paradox. - Mitral valve: There is a mobile density on the inferior wall near the vicinity of the papillary muscle in the 2 chamber view that is most consistent with ruptured papillary muscle although the location is lower  than typical location for a papillary muscle. Consider TEE or cardiac MRI for further evaluation. There was moderate regurgitation. - Right ventricle: The cavity size was mildly dilated. Wall thickness was normal. Systolic function was moderately reduced. - Pulmonic valve: There was mild regurgitation. - Pulmonary arteries: PA peak pressure: 37 mm Hg (S). - Pericardium, extracardiac: A trivial pericardial effusion was identified posterior to the heart. Impressions: - The right ventricular systolic pressure was increased consistent with mild pulmonary hypertension.     ASSESSMENT AND PLAN  58 yo with CAD s/p CABG 11/14 and ischemic cardiomyopathy presented with acute on chronic systolic CHF and massive volume overload.   1. Acute on chronic systolic CHF: EF 16%19% by echo in [place] in 11/14, no followup since then. Gradually built up massive fluid accumulation. No medications since 06/2013 due to financial hardship.  -- Placed on Lasix gtt at 8 mg/hr with vigorous diuresis. Then decreased to Lasix to 5 mg/hr for a little slower diuresis.  -- Net neg 15L. Weight 230lbs --> 215 lbs.  -- 2D ECHO today with EF 20%; e/E&'>21 suggestive of elevated LV filling pressures. Severe LV dilation. Diffuse hypokinesis. Akinesis of the entireapical myocardium. Ventricular septum with motion paradox. Mitral valve with mod MR and possibly papillary muscle rupture. Mild RV dilation. RV systolic function mod reduced. Mild RV dilation. Mild PV regurge. PA pressure 37 mm HG. Trivial pericardial effusion posterior to heart.  -- Continue Coreg 3.125mg  BID and lisinopril 2.5 mg and spironolactone 12.5 mg qd added yesterday.  -- Possibly candidate for CRT-D as he has LBBB QRS 135 msec. Has not been on med therapy at home, so will likely need 3 months of med tx prior to repeat echo before device implantation.   Possibly MV papillary muscle rupture- 2D ECHO with "Mitral valve: There is a mobile density on the inferior wall  near the vicinity of the papillary muscle in the 2 chamber view that is most consistent with ruptured papillary muscle although the location is lower than typical location for a papillary muscle. Consider TEE or cardiac MRI for further evaluation. There was moderate regurgitation." -- Will order TEE for Friday AM with Dr. Shirlee LatchMcLean   CAD: s/p CABG. No chest pain, doubt ACS. Continue ASA, add back statin.   Smoking: Still 2-3 cigs/day, very heavy before that. Encouraged him to stop completely.   LE edema- massively volume overloaded -- Dopplers neg for DVT   Dispo- has no insurance. From [place] originally, living with daughter in [place] for now. Ran out of meds in 06/2013 and  has not followed up with cardiology at all.  Will need assistance for heart medications. Will have care management see about getting an orange card.     Billy Fischer PA-C  Pager [phone]

## 2014-03-02 NOTE — Progress Notes (Signed)
Echo Lab  2D Echocardiogram completed.  Jun Osment L Jaiyana Canale, RDCS 03/02/2014 12:21 PM

## 2014-03-02 NOTE — Progress Notes (Signed)
Patient did well last night with his Lasix drip.  He had a 15lb weight loss from admission weight (230lb) to this mornings (215lb).    No breathing issues currently.  Will continue to monitor.

## 2014-03-02 NOTE — Progress Notes (Addendum)
Paged on-call cardiology about patient blood pressure trending down.  Blood pressure is currently 84/58 manually checked by two RNs.    Dr. Adolm JosephWhitlock ordered to discontinue the lasix drip for 6 hours due to trending down blood pressure and to restart the drip at 2.315mL/hr at the end of the 6 hours.  Will continue to monitor.

## 2014-03-02 NOTE — Progress Notes (Addendum)
Patient had 9bts vtach.  No signs and symptoms of distress.  Paged cardiology.  Will continue to monitor.  Dr. Adolm JosephWhitlock ordered stat Mag and BMP labs.  Will call MD back if potassium is <4 and if magnesium is <2.

## 2014-03-03 LAB — TSH: TSH: 13.44 u[IU]/mL — ABNORMAL HIGH (ref 0.350–4.500)

## 2014-03-03 LAB — BASIC METABOLIC PANEL
Anion gap: 11 (ref 5–15)
BUN: 13 mg/dL (ref 6–23)
CO2: 34 mEq/L — ABNORMAL HIGH (ref 19–32)
Calcium: 9.3 mg/dL (ref 8.4–10.5)
Chloride: 96 mEq/L (ref 96–112)
Creatinine, Ser: 0.99 mg/dL (ref 0.50–1.35)
GFR calc Af Amer: >90 mL/min (ref >90)
GFR, EST NON AFRICAN AMERICAN: 88 mL/min — AB (ref >90)
Glucose, Bld: 90 mg/dL (ref 70–99)
Potassium: 3.6 mEq/L — ABNORMAL LOW (ref 3.7–5.3)
SODIUM: 141 meq/L (ref 137–147)

## 2014-03-03 LAB — CBC
HCT: 45 % (ref 39.0–52.0)
Hemoglobin: 14.8 g/dL (ref 13.0–17.0)
MCH: 29.1 pg (ref 26.0–34.0)
MCHC: 32.9 g/dL (ref 30.0–36.0)
MCV: 88.4 fL (ref 78.0–100.0)
PLATELETS: 166 10*3/uL (ref 150–400)
RBC: 5.09 MIL/uL (ref 4.22–5.81)
RDW: 17 % — ABNORMAL HIGH (ref 11.5–15.5)
WBC: 6.8 10*3/uL (ref 4.0–10.5)

## 2014-03-03 LAB — MAGNESIUM: MAGNESIUM: 1.4 mg/dL — AB (ref 1.5–2.5)

## 2014-03-03 MED ORDER — MAGNESIUM SULFATE 40 MG/ML IJ SOLN
2.0000 g | Freq: Once | INTRAMUSCULAR | Status: AC
  Administered 2014-03-03: 2 g via INTRAVENOUS
  Filled 2014-03-03: qty 50

## 2014-03-03 MED ORDER — POTASSIUM CHLORIDE CRYS ER 20 MEQ PO TBCR
20.0000 meq | EXTENDED_RELEASE_TABLET | Freq: Once | ORAL | Status: AC
  Administered 2014-03-03: 20 meq via ORAL
  Filled 2014-03-03: qty 1

## 2014-03-03 MED ORDER — POTASSIUM CHLORIDE CRYS ER 20 MEQ PO TBCR
20.0000 meq | EXTENDED_RELEASE_TABLET | Freq: Once | ORAL | Status: AC
  Administered 2014-03-03: 20 meq via ORAL

## 2014-03-03 MED ORDER — LISINOPRIL 2.5 MG PO TABS
2.5000 mg | ORAL_TABLET | Freq: Two times a day (BID) | ORAL | Status: DC
  Administered 2014-03-04 – 2014-03-06 (×3): 2.5 mg via ORAL
  Filled 2014-03-03 (×9): qty 1

## 2014-03-03 MED ORDER — SPIRONOLACTONE 25 MG PO TABS
25.0000 mg | ORAL_TABLET | Freq: Every day | ORAL | Status: DC
  Administered 2014-03-03 – 2014-03-06 (×3): 25 mg via ORAL
  Filled 2014-03-03 (×5): qty 1

## 2014-03-03 NOTE — Progress Notes (Signed)
Pt BP 98/60 manual, pt in NAD. Lasix gtt re-started per order. Will continue to monitor. Huel Coventryosenberger, Glendora Clouatre A, RN

## 2014-03-03 NOTE — Progress Notes (Signed)
Pt Potassium 3.6 and Magnesium 1.4 Dr. Adolm JosephWhitlock paged. New order for 2g IVPB magnesium and 20mEQ PO potassium.

## 2014-03-03 NOTE — Progress Notes (Signed)
Lab called to verify if received order for STAT labs. Phlebotomist on the way to draw labs.

## 2014-03-03 NOTE — Progress Notes (Signed)
Patient ID: Joseph SiresMichael Haston, male   DOB: 1955/06/04, 58 y.o.   MRN: [id number]   SUBJECTIVE: Patient had CABG in 11/14 in [place], EF 19% by echo at that time.  Has not had followup since then with cardiology.  He was admitted yesterday with CHF exacerbation (dyspnea, orthopnea).  No chest pain.    Patient diuresed well again on Lasix 5 mg/hr.  He is down another 16 lbs.  Creatinine stable, continues to breathe well.   Echo with EF 20%, severe LV dilation, akinetic apex, ?ruptured papillary muscle with moderate MR, RV mildly dilated with moderately decreased systolic function.   Scheduled Meds: . aspirin EC  81 mg Oral Daily  . atorvastatin  40 mg Oral q1800  . carvedilol  3.125 mg Oral BID WC  . heparin  5,000 Units Subcutaneous 3 times per day  . lisinopril  2.5 mg Oral BID  . potassium chloride  20 mEq Oral BID  . potassium chloride  20 mEq Oral Once  . sodium chloride  3 mL Intravenous Q12H  . spironolactone  25 mg Oral Daily   Continuous Infusions: . furosemide (LASIX) infusion 2.5 mg/hr (03/03/14 0544)   PRN Meds:.sodium chloride, acetaminophen, ondansetron (ZOFRAN) IV, sodium chloride    Filed Vitals:   03/02/14 2305 03/03/14 0208 03/03/14 0212 03/03/14 0545  BP: 84/58 87/58 95/65  98/60  Pulse:  69  71  Temp:  97.8 F (36.6 C)  98.4 F (36.9 C)  TempSrc:  Oral  Oral  Resp:  17  19  Height:      Weight:    199 lb 1.2 oz (90.3 kg)  SpO2:  91% 90% 94%    Intake/Output Summary (Last 24 hours) at 03/03/14 0817 Last data filed at 03/03/14 0733  Gross per 24 hour  Intake   1140 ml  Output   8175 ml  Net  -7035 ml    LABS: Basic Metabolic Panel:  Recent Labs  59/93/5710/14/15 0400 03/03/14 0253  NA 144 141  K 4.3 3.6*  CL 100 96  CO2 34* 34*  GLUCOSE 87 90  BUN 15 13  CREATININE 1.10 0.99  CALCIUM 9.3 9.3  MG  --  1.4*   Liver Function Tests:  Recent Labs  03/01/14 0914  AST 33  ALT 21  ALKPHOS 104  BILITOT 1.7*  PROT 7.5  ALBUMIN 2.9*   No results found  for this basename: LIPASE, AMYLASE,  in the last 72 hours CBC:  Recent Labs  03/01/14 1554 03/03/14 0253  WBC 9.0 6.8  HGB 15.3 14.8  HCT 46.8 45.0  MCV 89.0 88.4  PLT 145* 166   Cardiac Enzymes:  Recent Labs  03/01/14 0914  TROPONINI <0.30   BNP: No components found with this basename: POCBNP,  D-Dimer: No results found for this basename: DDIMER,  in the last 72 hours Hemoglobin A1C: No results found for this basename: HGBA1C,  in the last 72 hours Fasting Lipid Panel:  Recent Labs  03/02/14 0400  CHOL 119  HDL 33*  LDLCALC 73  TRIG 63  CHOLHDL 3.6   Thyroid Function Tests:  Recent Labs  03/03/14 0253  TSH 13.440*   Anemia Panel: No results found for this basename: VITAMINB12, FOLATE, FERRITIN, TIBC, IRON, RETICCTPCT,  in the last 72 hours  RADIOLOGY: Dg Chest 2 View  03/01/2014   CLINICAL DATA:  58 year old male with acute shortness of breath and bilateral lower extremity swelling. Initial encounter. Personal history of 2014 CABG.  EXAM: CHEST  2 VIEW  COMPARISON:  None.  FINDINGS: Cardiomegaly. Small bilateral pleural effusions. Pulmonary vascular congestion with suggestion of Kerley B-lines. No pneumothorax or consolidation. Large lung volumes. Visualized tracheal air column is within normal limits. No acute osseous abnormality identified.  IMPRESSION: Cardiomegaly with small pleural effusions and interstitial edema.   Electronically Signed   By: Augusto GambleLee  Hall M.D.   On: 03/01/2014 09:34    PHYSICAL EXAM General: NAD Neck: JVP 8-9 cm, no thyromegaly or thyroid nodule.  Lungs: Clear to auscultation bilaterally with normal respiratory effort. CV: Nondisplaced PMI.  Heart regular S1/S2, soft S3, 1/6 HSM apex.  1+ edema to knees bilaterally.   Abdomen: Soft, nontender, no hepatosplenomegaly, no distention.  Neurologic: Alert and oriented x 3.  Psych: Normal affect. Extremities: No clubbing or cyanosis.   TELEMETRY: Reviewed telemetry pt in NSR  ASSESSMENT  AND PLAN: 58 yo with CAD s/p CABG 11/14 and ischemic cardiomyopathy presented with acute on chronic systolic CHF and massive volume overload.  1. Acute on chronic systolic CHF: EF 86%19% by echo in [place] in 11/14, no followup since then. Gradually built up massive fluid accumulation.  Continues to diurese well, weight down another 16 lbs.  EF 20% on echo with severe LV dilation and ?papillary muscle rupture with moderate MR, mildly dilated RV with moderately decreased systolic function.   He still has some volume overload but much improved.  - Continue Lasix gtt for probably one more day.   - Continue Coreg at current dose.  - Can increase lisinopril to 2.5 mg bid.  - Increase spironolactone to 25 mg daily.  -Will be candidate for CRT-D as he has LBBB QRS 135 msec.  Has not been on med therapy at home, so will likely need 3 months of med tx prior to repeat echo before device implantation.  2. CAD: s/p CABG.  No chest pain, doubt ACS.  Continue ASA, added back statin.  3. Smoking: Still 2-3 cigs/day, very heavy before that.  Encouraged him to stop completely.  4. MR: Echo concerning for ruptured papillary muscle with moderate MR.  Will evaluate tomorrow by TEE.   [name] 03/03/2014 8:17 AM

## 2014-03-04 ENCOUNTER — Encounter (HOSPITAL_COMMUNITY)
Admission: EM | Disposition: A | Discharge status: Home / Self Care | Payer: Self-pay | Source: Home / Self Care | Attending: Cardiovascular Disease

## 2014-03-04 ENCOUNTER — Encounter (HOSPITAL_COMMUNITY): Payer: Self-pay

## 2014-03-04 DIAGNOSIS — I341 Nonrheumatic mitral (valve) prolapse: Secondary | ICD-10-CM

## 2014-03-04 HISTORY — PX: TEE WITHOUT CARDIOVERSION: SHX5443

## 2014-03-04 LAB — PROTIME-INR
INR: 1.19 (ref 0.00–1.49)
Prothrombin Time: 15.3 seconds — ABNORMAL HIGH (ref 11.6–15.2)

## 2014-03-04 LAB — CBC
HEMATOCRIT: 45.1 % (ref 39.0–52.0)
Hemoglobin: 14.3 g/dL (ref 13.0–17.0)
MCH: 28.3 pg (ref 26.0–34.0)
MCHC: 31.7 g/dL (ref 30.0–36.0)
MCV: 89.3 fL (ref 78.0–100.0)
Platelets: 146 10*3/uL — ABNORMAL LOW (ref 150–400)
RBC: 5.05 MIL/uL (ref 4.22–5.81)
RDW: 17.2 % — AB (ref 11.5–15.5)
WBC: 6 10*3/uL (ref 4.0–10.5)

## 2014-03-04 LAB — BASIC METABOLIC PANEL
ANION GAP: 9 (ref 5–15)
BUN: 16 mg/dL (ref 6–23)
CALCIUM: 9.4 mg/dL (ref 8.4–10.5)
CO2: 34 mEq/L — ABNORMAL HIGH (ref 19–32)
CREATININE: 1.23 mg/dL (ref 0.50–1.35)
Chloride: 97 mEq/L (ref 96–112)
GFR calc Af Amer: 73 mL/min — ABNORMAL LOW (ref >90)
GFR calc non Af Amer: 63 mL/min — ABNORMAL LOW (ref >90)
Glucose, Bld: 110 mg/dL — ABNORMAL HIGH (ref 70–99)
Potassium: 5.1 mEq/L (ref 3.7–5.3)
Sodium: 140 mEq/L (ref 137–147)

## 2014-03-04 SURGERY — ECHOCARDIOGRAM, TRANSESOPHAGEAL
Anesthesia: Moderate Sedation

## 2014-03-04 MED ORDER — WARFARIN SODIUM 7.5 MG PO TABS
7.5000 mg | ORAL_TABLET | Freq: Once | ORAL | Status: AC
  Administered 2014-03-04: 7.5 mg via ORAL
  Filled 2014-03-04: qty 1

## 2014-03-04 MED ORDER — FENTANYL CITRATE 0.05 MG/ML IJ SOLN
INTRAMUSCULAR | Status: DC | PRN
  Administered 2014-03-04 (×2): 25 ug via INTRAVENOUS

## 2014-03-04 MED ORDER — MIDAZOLAM HCL 10 MG/2ML IJ SOLN
INTRAMUSCULAR | Status: DC | PRN
  Administered 2014-03-04: 1 mg via INTRAVENOUS
  Administered 2014-03-04 (×2): 2 mg via INTRAVENOUS

## 2014-03-04 MED ORDER — FUROSEMIDE 40 MG PO TABS
40.0000 mg | ORAL_TABLET | Freq: Every day | ORAL | Status: DC
  Administered 2014-03-04 – 2014-03-06 (×3): 40 mg via ORAL
  Filled 2014-03-04 (×4): qty 1

## 2014-03-04 MED ORDER — PATIENT'S GUIDE TO USING COUMADIN BOOK
Freq: Once | Status: AC
  Administered 2014-03-04: 1
  Filled 2014-03-04: qty 1

## 2014-03-04 MED ORDER — SODIUM CHLORIDE 0.9 % IV SOLN
INTRAVENOUS | Status: DC
  Administered 2014-03-04: 18:00:00 via INTRAVENOUS

## 2014-03-04 MED ORDER — ENOXAPARIN SODIUM 100 MG/ML ~~LOC~~ SOLN
1.0000 mg/kg | Freq: Two times a day (BID) | SUBCUTANEOUS | Status: DC
  Administered 2014-03-04 – 2014-03-06 (×5): 85 mg via SUBCUTANEOUS
  Filled 2014-03-04 (×8): qty 1

## 2014-03-04 MED ORDER — BUTAMBEN-TETRACAINE-BENZOCAINE 2-2-14 % EX AERO
INHALATION_SPRAY | CUTANEOUS | Status: DC | PRN
  Administered 2014-03-04: 2 via TOPICAL

## 2014-03-04 MED ORDER — WARFARIN - PHARMACIST DOSING INPATIENT: Freq: Every day | Status: DC

## 2014-03-04 NOTE — Progress Notes (Signed)
Left message for SW clinician to give daughter, Misty StanleyLisa at return call, at [phone].

## 2014-03-04 NOTE — Progress Notes (Signed)
  Echocardiogram Echocardiogram Transesophageal has been performed.  Joseph Carson, Joseph Carson 03/04/2014, 9:09 AM

## 2014-03-04 NOTE — Progress Notes (Signed)
Dr. Shirlee LatchMclean brought Dr. Kalman Jewelsrioutri to bedside to exam US/TEE results for 2nd opinion ie. Clot vs. Papillary muscle detachment present in atrium/smoke, need for anti-coagulation.

## 2014-03-04 NOTE — Progress Notes (Signed)
Pt BP 84/56 manual, HR in 70s. Pt asymptomatic, no dizziness, chest pain or shortness of breath. Order parameters to notify physician if BP less than 85 systolic. Pt on lasix gtt at 2.715ml/hr, pt with 4,625 ml urine output since 7AM on 10/15. Dr. Adolm JosephWhitlock notified ordered to continue lasix and notify if BP less than 80 systolic. Will continue to monitor. Huel Coventryosenberger, Markiah Janeway A, RN

## 2014-03-04 NOTE — Progress Notes (Signed)
Patient ID: Joseph SiresMichael Carson, male   DOB: 09/13/1955, 58 y.o.   MRN: [id number]   SUBJECTIVE: Patient had CABG in 11/14 in [place], EF 19% by echo at that time.  Has not had followup since then with cardiology.  He was admitted with CHF exacerbation (dyspnea, orthopnea).  No chest pain.    Patient diuresed well again on Lasix 5 mg/hr.  He is down another 8 lbs.  Creatinine mildly elevated from baseline, continues to breathe well.   Echo with EF 20%, severe LV dilation, akinetic apex, ?ruptured papillary muscle with moderate MR, RV mildly dilated with moderately decreased systolic function.   Scheduled Meds: . [facility][MAR HOLD] aspirin EC  81 mg Oral Daily  . [facility][MAR HOLD] atorvastatin  40 mg Oral q1800  . [facility][MAR HOLD] carvedilol  3.125 mg Oral BID WC  . furosemide  40 mg Oral Daily  . [MAR HOLD] heparin  5,000 Units Subcutaneous 3 times per day  . [MAR HOLD] lisinopril  2.5 mg Oral BID  . [MAR HOLD] sodium chloride  3 mL Intravenous Q12H  . [facility][MAR HOLD] spironolactone  25 mg Oral Daily   Continuous Infusions:   PRN Meds:.[MAR HOLD] sodium chloride, [MAR HOLD] acetaminophen, butamben-tetracaine-benzocaine, fentaNYL, midazolam, [MAR HOLD] ondansetron (ZOFRAN) IV, [MAR HOLD] sodium chloride    Filed Vitals:   03/04/14 0221 03/04/14 0651 03/04/14 0736 03/04/14 0815  BP: 84/56 98/71 98/72  108/79  Pulse:  71 70 77  Temp:  97.7 F (36.5 C) 97.9 F (36.6 C)   TempSrc:  Oral Oral   Resp:  16 21 18   Height:      Weight:  191 lb (86.637 kg)    SpO2:  97% 96% 100%    Intake/Output Summary (Last 24 hours) at 03/04/14 0824 Last data filed at 03/04/14 0731  Gross per 24 hour  Intake   1800 ml  Output   5650 ml  Net  -3850 ml    LABS: Basic Metabolic Panel:  Recent Labs  62/13/0810/14/15 0400 03/03/14 0253 03/04/14 0350  NA 144 141 140  K 4.3 3.6* 5.1  CL 100 96 97  CO2 34* 34* 34*  GLUCOSE 87 90 110*  BUN 15 13 16   CREATININE 1.10 0.99 1.23  CALCIUM 9.3 9.3 9.4  MG  --  1.4*  --    Liver  Function Tests:  Recent Labs  03/01/14 0914  AST 33  ALT 21  ALKPHOS 104  BILITOT 1.7*  PROT 7.5  ALBUMIN 2.9*   No results found for this basename: LIPASE, AMYLASE,  in the last 72 hours CBC:  Recent Labs  03/03/14 0253 03/04/14 0350  WBC 6.8 6.0  HGB 14.8 14.3  HCT 45.0 45.1  MCV 88.4 89.3  PLT 166 146*   Cardiac Enzymes:  Recent Labs  03/01/14 0914  TROPONINI <0.30   BNP: No components found with this basename: POCBNP,  D-Dimer: No results found for this basename: DDIMER,  in the last 72 hours Hemoglobin A1C: No results found for this basename: HGBA1C,  in the last 72 hours Fasting Lipid Panel:  Recent Labs  03/02/14 0400  CHOL 119  HDL 33*  LDLCALC 73  TRIG 63  CHOLHDL 3.6   Thyroid Function Tests:  Recent Labs  03/03/14 0253  TSH 13.440*   Anemia Panel: No results found for this basename: VITAMINB12, FOLATE, FERRITIN, TIBC, IRON, RETICCTPCT,  in the last 72 hours  RADIOLOGY: Dg Chest 2 View  03/01/2014   CLINICAL DATA:  58 year old male  with acute shortness of breath and bilateral lower extremity swelling. Initial encounter. Personal history of 2014 CABG.  EXAM: CHEST  2 VIEW  COMPARISON:  None.  FINDINGS: Cardiomegaly. Small bilateral pleural effusions. Pulmonary vascular congestion with suggestion of Kerley B-lines. No pneumothorax or consolidation. Large lung volumes. Visualized tracheal air column is within normal limits. No acute osseous abnormality identified.  IMPRESSION: Cardiomegaly with small pleural effusions and interstitial edema.   Electronically Signed   By: Augusto GambleLee  Hall M.D.   On: 03/01/2014 09:34    PHYSICAL EXAM General: NAD Neck: JVP 8-9 cm, no thyromegaly or thyroid nodule.  Lungs: Clear to auscultation bilaterally with normal respiratory effort. CV: Nondisplaced PMI.  Heart regular S1/S2, soft S3, 1/6 HSM apex.  Trace ankle edema bilaterally.   Abdomen: Soft, nontender, no hepatosplenomegaly, no distention.  Neurologic:  Alert and oriented x 3.  Psych: Normal affect. Extremities: No clubbing or cyanosis.   TELEMETRY: Reviewed telemetry pt in NSR  ASSESSMENT AND PLAN: 58 yo with CAD s/p CABG 11/14 and ischemic cardiomyopathy presented with acute on chronic systolic CHF and massive volume overload.  1. Acute on chronic systolic CHF: EF 16%19% by echo in [place] in 11/14, no followup since then. Gradually built up massive fluid accumulation.  Continues to diurese well, weight down another 8 lbs.  EF 20% on echo with severe LV dilation and ?papillary muscle rupture with moderate MR, mildly dilated RV with moderately decreased systolic function.   Volume status much improved.   - Transition to po Lasix and stop KCl.  - Continue Coreg, lisinopril, spironolactone at current doses.   - Will be candidate for CRT-D as he has LBBB QRS 135 msec.  Has not been on med therapy at home, so will likely need 3 months of med tx prior to repeat echo before device implantation.  2. CAD: s/p CABG.  No chest pain, doubt ACS.  Continue ASA, added back statin.  3. Smoking: Still 2-3 cigs/day, very heavy before that.  Encouraged him to stop completely.  4. MR: Echo concerning for ruptured papillary muscle with moderate MR.  Will evaluate today by TEE. 5. Disposition: Probably home tomorrow with followup in CHF clinic next week.   [name] 03/04/2014 8:24 AM

## 2014-03-04 NOTE — Progress Notes (Signed)
Per CM, prior to pt being discharged, he MUST have the MATCH NOTE, that the Case Manager will provide, so that pt will be able to get his discharge medications. 

## 2014-03-04 NOTE — Interval H&P Note (Signed)
History and Physical Interval Note:  03/04/2014 8:23 AM  Joseph SiresMichael Avellino  has presented today for surgery, with the diagnosis of poss papillary muscle rupture   The various methods of treatment have been discussed with the patient and family. After consideration of risks, benefits and other options for treatment, the patient has consented to  Procedure(s): TRANSESOPHAGEAL ECHOCARDIOGRAM (TEE) (N/A) as a surgical intervention .  The patient's history has been reviewed, patient examined, no change in status, stable for surgery.  I have reviewed the patient's chart and labs.  Questions were answered to the patient's satisfaction.     Givanni Staron Chesapeake EnergyMcLean

## 2014-03-04 NOTE — CV Procedure (Signed)
Procedure: TEE  Indication: ?Ruptured papillary muscle  Sedation: Versed 4 mg IV, Fentanyl 50 mcg IV  Findings: Please see echo section for full report.  Mildly dilated LV with severe systolic dysfunction, EF 15-20%.  The septum and anterior walls are akinetic.  The lateral wall is hypokinetic.  I do not think that there is a papillary muscle rupture.  There is a small mobile mass attached to the basal anterior/anteroseptal wall.  This area is akinetic.  I am concerned that this may be a small thrombus.  There is mild mitral regurgitation.  The aortic valve is trileaflet without stenosis or regurgitation.  The RV is mildly dilated with moderately decreased systolic function.  Negative bubble study.   Impression: Possible basal anterior/anteroseptal small mobile thrombus in an area of akinesis.  Will need anticoagulation, will bridge to therapeutic coumadin with Lovenox.   Marca AnconaDalton Jennell Janosik 03/04/2014 9:02 AM

## 2014-03-04 NOTE — Progress Notes (Signed)
ANTICOAGULATION CONSULT NOTE - Initial Consult  Pharmacy Consult for lovenox/warfarin  Indication: LV Thrombus  No Known Allergies  Patient Measurements: Height: 5\' 10"  (177.8 cm) Weight: 191 lb (86.637 kg) IBW/kg (Calculated) : 73   Vital Signs: Temp: 97.9 F (36.6 C) (10/16 0736) Temp Source: Oral (10/16 0736) BP: 94/69 mmHg (10/16 0910) Pulse Rate: 67 (10/16 0845)  Labs:  Recent Labs  03/01/14 1554 03/02/14 0400 03/03/14 0253 03/04/14 0350  HGB 15.3  --  14.8 14.3  HCT 46.8  --  45.0 45.1  PLT 145*  --  166 146*  CREATININE 0.97 1.10 0.99 1.23    Estimated Creatinine Clearance: 67.6 ml/min (by C-G formula based on Cr of 1.23).   Medical History: Past Medical History  Diagnosis Date  . Chronic combined systolic and diastolic heart failure November 2014  . Obesity   . Pulmonary hypertension   . Hyperbilirubinemia   . Ischemic cardiomyopathy November 2014    Ejection fraction 19% by echocardiogram November 2014  . Umbilical hernia   . CHF (congestive heart failure)    Assessment: 58 year old male s/p TEE this am found to have possible LV thrombus attached to the basal anterior/anteroseptal wall. No cardioversion done. New orders received to start lovenox and bridge to warfarin. Will order baseline INR as not on any anticoagulation prior to admission.  Goal of Therapy:  INR 2-3 Anti-Xa level 0.6-1 units/ml 4hrs after LMWH dose given Monitor platelets by anticoagulation protocol: Yes   Plan:  Lovenox 1mg /kg bid (85mg  q12h) - check cbc q72h while on lovenox Warfarin 7.5mg  tonight Daily INR Follow up on education   Sheppard CoilFrank Wilson PharmD., BCPS Clinical Pharmacist Pager [phone] 03/04/2014 10:44 AM

## 2014-03-04 NOTE — H&P (View-Only) (Signed)
Patient ID: Joseph SiresMichael Carson, male   DOB: 1955/06/04, 58 y.o.   MRN: [id number]   SUBJECTIVE: Patient [place] CABG in 11/14 in [place], EF 19% by echo at that time.  Has not had followup since then with cardiology.  He was admitted yesterday with CHF exacerbation (dyspnea, orthopnea).  No chest pain.    Patient diuresed well again on Lasix 5 mg/hr.  He is down another 16 lbs.  Creatinine stable, continues to breathe well.   Echo with EF 20%, severe LV dilation, akinetic apex, ?ruptured papillary muscle with moderate MR, RV mildly dilated with moderately decreased systolic function.   Scheduled Meds: . aspirin EC  81 mg Oral Daily  . atorvastatin  40 mg Oral q1800  . carvedilol  3.125 mg Oral BID WC  . heparin  5,000 Units Subcutaneous 3 times per day  . lisinopril  2.5 mg Oral BID  . potassium chloride  20 mEq Oral BID  . potassium chloride  20 mEq Oral Once  . sodium chloride  3 mL Intravenous Q12H  . spironolactone  25 mg Oral Daily   Continuous Infusions: . furosemide (LASIX) infusion 2.5 mg/hr (03/03/14 0544)   PRN Meds:.sodium chloride, acetaminophen, ondansetron (ZOFRAN) IV, sodium chloride    Filed Vitals:   03/02/14 2305 03/03/14 0208 03/03/14 0212 03/03/14 0545  BP: 84/58 87/58 95/65  98/60  Pulse:  69  71  Temp:  97.8 F (36.6 C)  98.4 F (36.9 C)  TempSrc:  Oral  Oral  Resp:  17  19  Height:      Weight:    199 lb 1.2 oz (90.3 kg)  SpO2:  91% 90% 94%    Intake/Output Summary (Last 24 hours) at 03/03/14 0817 Last data filed at 03/03/14 0733  Gross per 24 hour  Intake   1140 ml  Output   8175 ml  Net  -7035 ml    LABS: Basic Metabolic Panel:  Recent Labs  59/93/5710/14/15 0400 03/03/14 0253  NA 144 141  K 4.3 3.6*  CL 100 96  CO2 34* 34*  GLUCOSE 87 90  BUN 15 13  CREATININE 1.10 0.99  CALCIUM 9.3 9.3  MG  --  1.4*   Liver Function Tests:  Recent Labs  03/01/14 0914  AST 33  ALT 21  ALKPHOS 104  BILITOT 1.7*  PROT 7.5  ALBUMIN 2.9*   No results found  for this basename: LIPASE, AMYLASE,  in the last 72 hours CBC:  Recent Labs  03/01/14 1554 03/03/14 0253  WBC 9.0 6.8  HGB 15.3 14.8  HCT 46.8 45.0  MCV 89.0 88.4  PLT 145* 166   Cardiac Enzymes:  Recent Labs  03/01/14 0914  TROPONINI <0.30   BNP: No components found with this basename: POCBNP,  D-Dimer: No results found for this basename: DDIMER,  in the last 72 hours Hemoglobin A1C: No results found for this basename: HGBA1C,  in the last 72 hours Fasting Lipid Panel:  Recent Labs  03/02/14 0400  CHOL 119  HDL 33*  LDLCALC 73  TRIG 63  CHOLHDL 3.6   Thyroid Function Tests:  Recent Labs  03/03/14 0253  TSH 13.440*   Anemia Panel: No results found for this basename: VITAMINB12, FOLATE, FERRITIN, TIBC, IRON, RETICCTPCT,  in the last 72 hours  RADIOLOGY: Dg Chest 2 View  03/01/2014   CLINICAL DATA:  58 year old male with acute shortness of breath and bilateral lower extremity swelling. Initial encounter. Personal history of 2014 CABG.  EXAM: CHEST  2 VIEW  COMPARISON:  None.  FINDINGS: Cardiomegaly. Small bilateral pleural effusions. Pulmonary vascular congestion with suggestion of Kerley B-lines. No pneumothorax or consolidation. Large lung volumes. Visualized tracheal air column is within normal limits. No acute osseous abnormality identified.  IMPRESSION: Cardiomegaly with small pleural effusions and interstitial edema.   Electronically Signed   By: Lee  Hall M.D.   On: 03/01/2014 09:34    PHYSICAL EXAM General: NAD Neck: JVP 8-9 cm, no thyromegaly or thyroid nodule.  Lungs: Clear to auscultation bilaterally with normal respiratory effort. CV: Nondisplaced PMI.  Heart regular S1/S2, soft S3, 1/6 HSM apex.  1+ edema to knees bilaterally.   Abdomen: Soft, nontender, no hepatosplenomegaly, no distention.  Neurologic: Alert and oriented x 3.  Psych: Normal affect. Extremities: No clubbing or cyanosis.   TELEMETRY: Reviewed telemetry pt in NSR  ASSESSMENT  AND PLAN: 58 yo with CAD s/p CABG 11/14 and ischemic cardiomyopathy presented with acute on chronic systolic CHF and massive volume overload.  1. Acute on chronic systolic CHF: EF 19% by echo in [place] in 11/14, no followup since then. Gradually built up massive fluid accumulation.  Continues to diurese well, weight down another 16 lbs.  EF 20% on echo with severe LV dilation and ?papillary muscle rupture with moderate MR, mildly dilated RV with moderately decreased systolic function.   He still has some volume overload but much improved.  - Continue Lasix gtt for probably one more day.   - Continue Coreg at current dose.  - Can increase lisinopril to 2.5 mg bid.  - Increase spironolactone to 25 mg daily.  -Will be candidate for CRT-D as he has LBBB QRS 135 msec.  Has not been on med therapy at home, so will likely need 3 months of med tx prior to repeat echo before device implantation.  2. CAD: s/p CABG.  No chest pain, doubt ACS.  Continue ASA, added back statin.  3. Smoking: Still 2-3 cigs/day, very heavy before that.  Encouraged him to stop completely.  4. MR: Echo concerning for ruptured papillary muscle with moderate MR.  Will evaluate tomorrow by TEE.   [name] 03/03/2014 8:17 AM    

## 2014-03-04 NOTE — Plan of Care (Signed)
Problem: Phase I Progression Outcomes Goal: EF % per last Echo/documented,Core Reminder form on chart Outcome: Completed/Met Date Met:  03/04/14 EF 20% per echo on 03/02/14 Goal: Up in chair, BRP Outcome: Completed/Met Date Met:  03/04/14 Pt ad lib in room, steady on feet

## 2014-03-05 DIAGNOSIS — Z7901 Long term (current) use of anticoagulants: Secondary | ICD-10-CM

## 2014-03-05 DIAGNOSIS — I213 ST elevation (STEMI) myocardial infarction of unspecified site: Secondary | ICD-10-CM

## 2014-03-05 DIAGNOSIS — Z720 Tobacco use: Secondary | ICD-10-CM

## 2014-03-05 DIAGNOSIS — I255 Ischemic cardiomyopathy: Secondary | ICD-10-CM

## 2014-03-05 DIAGNOSIS — I270 Primary pulmonary hypertension: Secondary | ICD-10-CM

## 2014-03-05 DIAGNOSIS — I513 Intracardiac thrombosis, not elsewhere classified: Secondary | ICD-10-CM

## 2014-03-05 DIAGNOSIS — Z951 Presence of aortocoronary bypass graft: Secondary | ICD-10-CM

## 2014-03-05 LAB — CBC
HEMATOCRIT: 44.8 % (ref 39.0–52.0)
Hemoglobin: 14.3 g/dL (ref 13.0–17.0)
MCH: 28.7 pg (ref 26.0–34.0)
MCHC: 31.9 g/dL (ref 30.0–36.0)
MCV: 89.8 fL (ref 78.0–100.0)
Platelets: 150 10*3/uL (ref 150–400)
RBC: 4.99 MIL/uL (ref 4.22–5.81)
RDW: 17.1 % — ABNORMAL HIGH (ref 11.5–15.5)
WBC: 5.3 10*3/uL (ref 4.0–10.5)

## 2014-03-05 LAB — MAGNESIUM: Magnesium: 1.8 mg/dL (ref 1.5–2.5)

## 2014-03-05 LAB — BASIC METABOLIC PANEL
Anion gap: 9 (ref 5–15)
BUN: 17 mg/dL (ref 6–23)
CALCIUM: 9.2 mg/dL (ref 8.4–10.5)
CO2: 34 meq/L — AB (ref 19–32)
CREATININE: 1.12 mg/dL (ref 0.50–1.35)
Chloride: 96 mEq/L (ref 96–112)
GFR calc Af Amer: 82 mL/min — ABNORMAL LOW (ref >90)
GFR calc non Af Amer: 71 mL/min — ABNORMAL LOW (ref >90)
GLUCOSE: 90 mg/dL (ref 70–99)
Potassium: 4.5 mEq/L (ref 3.7–5.3)
Sodium: 139 mEq/L (ref 137–147)

## 2014-03-05 LAB — PROTIME-INR
INR: 1.28 (ref 0.00–1.49)
Prothrombin Time: 16.1 seconds — ABNORMAL HIGH (ref 11.6–15.2)

## 2014-03-05 LAB — PRO B NATRIURETIC PEPTIDE: Pro B Natriuretic peptide (BNP): 839.8 pg/mL — ABNORMAL HIGH (ref 0–125)

## 2014-03-05 MED ORDER — WARFARIN SODIUM 7.5 MG PO TABS
7.5000 mg | ORAL_TABLET | Freq: Once | ORAL | Status: AC
  Administered 2014-03-05: 7.5 mg via ORAL
  Filled 2014-03-05: qty 1

## 2014-03-05 NOTE — Progress Notes (Addendum)
Patient ID: Joseph Carson, male   DOB: 1955-11-10, 58 y.o.   MRN: [id number]    SUBJECTIVE: Patient had CABG in 11/14 in [place], EF 19% by echo at that time.  Has not had followup since then with cardiology.  He was admitted with CHF exacerbation (dyspnea, orthopnea).  No chest pain.    Patient diuresed well again on Lasix 5 mg/hr.  He is down another 6 lbs.  Creatinine mildly elevated from baseline, but stable continues to breathe well and starting to walk yesterday.   Scheduled Meds:  . aspirin EC  81 mg Oral Daily  . atorvastatin  40 mg Oral q1800  . carvedilol  3.125 mg Oral BID WC  . enoxaparin (LOVENOX) injection  1 mg/kg Subcutaneous BID  . furosemide  40 mg Oral Daily  . lisinopril  2.5 mg Oral BID  . sodium chloride  3 mL Intravenous Q12H  . spironolactone  25 mg Oral Daily  . Warfarin - Pharmacist Dosing Inpatient   Does not apply q1800   Continuous Infusions: . sodium chloride 20 mL/hr at 03/04/14 1741   PRN Meds:.sodium chloride, acetaminophen, ondansetron (ZOFRAN) IV, sodium chloride  Filed Vitals:   03/04/14 2117 03/05/14 0320 03/05/14 0459 03/05/14 0946  BP: 94/54 97/65 96/67  94/70  Pulse:  62 67 69  Temp:   97.3 F (36.3 C)   TempSrc:   Oral   Resp:   18   Height:      Weight:   185 lb 14.4 oz (84.324 kg)   SpO2:   100%     Intake/Output Summary (Last 24 hours) at 03/05/14 0952 Last data filed at 03/05/14 0828  Gross per 24 hour  Intake   1740 ml  Output   3800 ml  Net  -2060 ml   LABS: Basic Metabolic Panel:  Recent Labs  65/78/4610/15/15 0253 03/04/14 0350 03/05/14 0447  NA 141 140 139  K 3.6* 5.1 4.5  CL 96 97 96  CO2 34* 34* 34*  GLUCOSE 90 110* 90  BUN 13 16 17   CREATININE 0.99 1.23 1.12  CALCIUM 9.3 9.4 9.2  MG 1.4*  --  1.8    Recent Labs  03/04/14 0350 03/05/14 0447  WBC 6.0 5.3  HGB 14.3 14.3  HCT 45.1 44.8  MCV 89.3 89.8  PLT 146* 150    Recent Labs  03/03/14 0253  TSH 13.440*   RADIOLOGY: Dg Chest 2 View  03/01/2014    CLINICAL DATA:  58 year old male with acute shortness of breath and bilateral lower extremity swelling. Initial encounter. Personal history of 2014 CABG.  EXAM: CHEST  2 VIEW  COMPARISON:  None.  FINDINGS: Cardiomegaly. Small bilateral pleural effusions. Pulmonary vascular congestion with suggestion of Kerley B-lines. No pneumothorax or consolidation. Large lung volumes. Visualized tracheal air column is within normal limits. No acute osseous abnormality identified.  IMPRESSION: Cardiomegaly with small pleural effusions and interstitial edema.   Electronically Signed   By: Augusto GambleLee  Hall M.D.   On: 03/01/2014 09:34   PHYSICAL EXAM General: NAD Neck: JVP 8-9 cm, no thyromegaly or thyroid nodule.  Lungs: Clear to auscultation bilaterally with normal respiratory effort. CV: Nondisplaced PMI.  Heart regular S1/S2, soft S3, 1/6 HSM apex.  Trace ankle edema bilaterally.   Abdomen: Soft, nontender, no hepatosplenomegaly, no distention.  Neurologic: Alert and oriented x 3.  Psych: Normal affect. Extremities: No clubbing or cyanosis.   TELEMETRY: Reviewed telemetry pt in NSR   ASSESSMENT AND PLAN:  58 yo with  CAD s/p CABG 11/14 and ischemic cardiomyopathy presented with acute on chronic systolic CHF and massive volume overload.   1. Acute on chronic systolic CHF: EF 16%19% by echo in [place] in 11/14, no followup since then. Gradually built up massive fluid accumulation.  Continues to diurese well, weight down another 6 lbs (total 14 lbs in 2 days). Stable Crea.   TEE done yesterday for concern of ruptured papillary muscle:  Mildly dilated LV with severe systolic dysfunction, EF 15-20%. The septum and anterior walls are akinetic. The lateral wall is hypokinetic. I do not think that there is a papillary muscle rupture. There is a small mobile mass attached to the basal anterior/anteroseptal wall. This area is akinetic. I am concerned that this may be a small thrombus. There is mild mitral regurgitation. The aortic  valve is trileaflet without stenosis or regurgitation. The RV is mildly dilated with moderately decreased systolic function. Negative bubble study.  Impression: Possible basal anterior/anteroseptal small mobile thrombus in an area of akinesis. Will need anticoagulation, will bridge to therapeutic coumadin with Lovenox.   - Transition to po Lasix and stop KCl.  - Continue Coreg, lisinopril, spironolactone at current doses.   - Will be candidate for CRT-D as he has LBBB QRS 135 msec.  Has not been on med therapy at home, so will likely need 3 months of med tx prior to repeat echo before device implantation.  - started on Coumadin and lovenox for bridging, pharmacy dosing  2. CAD: s/p CABG.  No chest pain, doubt ACS.  Continue ASA, added back statin.   3. Smoking: Still 2-3 cigs/day, very heavy before that.  Encouraged him to stop completely.   4. MR: no evidence of papillary muscle rupture on TEE moderate MR.  Will evaluate today by TEE.  5. nsVT - 2 episodes, 6 beats and 16 beats, we will arrange for lifevest prior to the discharge, no room for uptitrating BB as BP low  6. Disposition: Probably home tomorrow with followup in CHF clinic next week.   Lars MassonELSON, Whitten Andreoni H 03/05/2014 9:52 AM

## 2014-03-05 NOTE — Progress Notes (Signed)
ANTICOAGULATION CONSULT NOTE - Follow Up Consult  Pharmacy Consult for Lovenox bridge to Coumadin Indication: LV thrombus  No Known Allergies  Patient Measurements: Height: 5\' 10"  (177.8 cm) Weight: 185 lb 14.4 oz (84.324 kg) (scale c) IBW/kg (Calculated) : 73 Heparin Dosing Weight:   Vital Signs: Temp: 97.8 F (36.6 C) (10/17 1314) Temp Source: Oral (10/17 1314) BP: 89/53 mmHg (10/17 1314) Pulse Rate: 66 (10/17 1314)  Labs:  Recent Labs  03/03/14 0253 03/04/14 0350 03/04/14 1129 03/05/14 0447  HGB 14.8 14.3  --  14.3  HCT 45.0 45.1  --  44.8  PLT 166 146*  --  150  LABPROT  --   --  15.3* 16.1*  INR  --   --  1.19 1.28  CREATININE 0.99 1.23  --  1.12    Estimated Creatinine Clearance: 74.2 ml/min (by C-G formula based on Cr of 1.12).   Medications:  Scheduled:  . aspirin EC  81 mg Oral Daily  . atorvastatin  40 mg Oral q1800  . carvedilol  3.125 mg Oral BID WC  . enoxaparin (LOVENOX) injection  1 mg/kg Subcutaneous BID  . furosemide  40 mg Oral Daily  . lisinopril  2.5 mg Oral BID  . sodium chloride  3 mL Intravenous Q12H  . spironolactone  25 mg Oral Daily  . Warfarin - Pharmacist Dosing Inpatient   Does not apply q1800    Assessment: 58yo male with (+)LV thrombus, on Lovenox bridge to Coumadin.  INR 1.28 this AM and CBC stable.  No bleeding problems noted.  Expected d/c home on 10/18, will need Coumadin education today.  Goal of Therapy:  INR 2-3 Monitor platelets by anticoagulation protocol: Yes   Plan:  1-  Continue Lovenox 1mg /kg SQ bid 2-  Coumadin 7.5mg  today 3-  F/U AM  Marisue HumbleKendra Tenise Stetler, PharmD Clinical Pharmacist [facility]

## 2014-03-05 NOTE — Progress Notes (Signed)
Pt with 16 beat NSVT. Pt asymptomatic. BP 97/65. Dr. Zachery ConchFriedman with cardiology notified. New order to add Magnesium to AM lab.

## 2014-03-06 ENCOUNTER — Encounter (HOSPITAL_COMMUNITY): Payer: Self-pay | Admitting: Physician Assistant

## 2014-03-06 DIAGNOSIS — I513 Intracardiac thrombosis, not elsewhere classified: Secondary | ICD-10-CM

## 2014-03-06 DIAGNOSIS — I472 Ventricular tachycardia: Secondary | ICD-10-CM

## 2014-03-06 DIAGNOSIS — E785 Hyperlipidemia, unspecified: Secondary | ICD-10-CM

## 2014-03-06 DIAGNOSIS — I447 Left bundle-branch block, unspecified: Secondary | ICD-10-CM

## 2014-03-06 DIAGNOSIS — I340 Nonrheumatic mitral (valve) insufficiency: Secondary | ICD-10-CM

## 2014-03-06 DIAGNOSIS — I4729 Other ventricular tachycardia: Secondary | ICD-10-CM

## 2014-03-06 LAB — PROTIME-INR
INR: 1.47 (ref 0.00–1.49)
PROTHROMBIN TIME: 18 s — AB (ref 11.6–15.2)

## 2014-03-06 MED ORDER — WARFARIN SODIUM 5 MG PO TABS
ORAL_TABLET | ORAL | Status: DC
Start: 2014-03-06 — End: 2014-03-10

## 2014-03-06 MED ORDER — CARVEDILOL 3.125 MG PO TABS: 3.1250 mg | ORAL_TABLET | Freq: Two times a day (BID) | ORAL | Status: DC

## 2014-03-06 MED ORDER — WARFARIN SODIUM 5 MG PO TABS: 5.0000 mg | ORAL_TABLET | Freq: Every day | ORAL | Status: DC

## 2014-03-06 MED ORDER — ENOXAPARIN SODIUM 100 MG/ML ~~LOC~~ SOLN: 1.0000 mg/kg | Freq: Two times a day (BID) | SUBCUTANEOUS | Status: DC

## 2014-03-06 MED ORDER — FUROSEMIDE 40 MG PO TABS: 40.0000 mg | ORAL_TABLET | Freq: Every day | ORAL | Status: DC

## 2014-03-06 MED ORDER — ATORVASTATIN CALCIUM 40 MG PO TABS: 40.0000 mg | ORAL_TABLET | Freq: Every day | ORAL | Status: DC

## 2014-03-06 MED ORDER — SPIRONOLACTONE 25 MG PO TABS: 25.0000 mg | ORAL_TABLET | Freq: Every day | ORAL | Status: DC

## 2014-03-06 MED ORDER — LISINOPRIL 2.5 MG PO TABS: 2.5000 mg | ORAL_TABLET | Freq: Two times a day (BID) | ORAL | Status: DC

## 2014-03-06 NOTE — Discharge Instructions (Signed)
You have 2 sets of prescriptions.  One to take to a local pharmacy for a one month supply under the Med Assist program [facility].  The other [facility]  You must call there to establish with a primary care provider as outlined by the case management nurse during your hospital stay.   There is only one prescription for Lovenox.  This medication will not likely be continued beyond the next week. If you cannot get your medications today at the pharmacy, make sure you call back to the case management nurse today for assistance.  Information on my medicine - Coumadin   (Warfarin)  This medication education was reviewed with me or my healthcare representative as part of my discharge preparation.  The pharmacist that spoke with me during my hospital stay was:  Renaee MundaHiatt, Astryd Pearcy P, [facility]  Why was Coumadin prescribed for you? Coumadin was prescribed for you because you have a blood clot or a medical condition that can cause an increased risk of forming blood clots. Blood clots can cause serious health problems by blocking the flow of blood to the heart, lung, or brain. Coumadin can prevent harmful blood clots from forming. As a reminder your indication for Coumadin is:   Select from menu  Left Ventricular clot  What test will check on my response to Coumadin? While on Coumadin (warfarin) you will need to have an INR test regularly to ensure that your dose is keeping you in the desired range. The INR (international normalized ratio) number is calculated from the result of the laboratory test called prothrombin time (PT).  If an INR APPOINTMENT HAS NOT ALREADY BEEN MADE FOR YOU please schedule an appointment to have this lab work done by your health care provider within 7 days. Your INR goal is usually a number between:  2 to 3 or your provider may give you a more narrow range like 2-2.5.  Ask your health care provider during an office visit what your goal INR  is.  What  do you need to  know  About  COUMADIN? Take Coumadin (warfarin) exactly as prescribed by your healthcare provider about the same time each day.  DO NOT stop taking without talking to the doctor who prescribed the medication.  Stopping without other blood clot prevention medication to take the place of Coumadin may increase your risk of developing a new clot or stroke.  Get refills before you run out.  What do you do if you miss a dose? If you miss a dose, take it as soon as you remember on the same day then continue your regularly scheduled regimen the next day.  Do not take two doses of Coumadin at the same time.  Important Safety Information A possible side effect of Coumadin (Warfarin) is an increased risk of bleeding. You should call your healthcare provider right away if you experience any of the following:   Bleeding from an injury or your nose that does not stop.   Unusual colored urine (red or dark brown) or unusual colored stools (red or black).   Unusual bruising for unknown reasons.   A serious fall or if you hit your head (even if there is no bleeding).  Some foods or medicines interact with Coumadin (warfarin) and might alter your response to warfarin. To help avoid this:   Eat a balanced diet, maintaining a consistent amount of Vitamin K.   Notify your provider about major  diet changes you plan to make.   Avoid alcohol or limit your intake to 1 drink for women and 2 drinks for men per day. (1 drink is 5 oz. wine, 12 oz. beer, or 1.5 oz. liquor.)  Make sure that ANY health care provider who prescribes medication for you knows that you are taking Coumadin (warfarin).  Also make sure the healthcare provider who is monitoring your Coumadin knows when you have started a new medication including herbals and non-prescription products.  Coumadin (Warfarin)  Major Drug Interactions  Increased Warfarin Effect Decreased Warfarin Effect  Alcohol (large quantities) Antibiotics  (esp. Septra/Bactrim, Flagyl, Cipro) Amiodarone (Cordarone) Aspirin (ASA) Cimetidine (Tagamet) Megestrol (Megace) NSAIDs (ibuprofen, naproxen, etc.) Piroxicam (Feldene) Propafenone (Rythmol SR) Propranolol (Inderal) Isoniazid (INH) Posaconazole (Noxafil) Barbiturates (Phenobarbital) Carbamazepine (Tegretol) Chlordiazepoxide (Librium) Cholestyramine (Questran) Griseofulvin Oral Contraceptives Rifampin Sucralfate (Carafate) Vitamin K   Coumadin (Warfarin) Major Herbal Interactions  Increased Warfarin Effect Decreased Warfarin Effect  Garlic Ginseng Ginkgo biloba Coenzyme Q10 Green tea St. Johns wort    Coumadin (Warfarin) FOOD Interactions  Eat a consistent number of servings per week of foods HIGH in Vitamin K (1 serving =  cup)  Collards (cooked, or boiled & drained) Kale (cooked, or boiled & drained) Mustard greens (cooked, or boiled & drained) Parsley *serving size only =  cup Spinach (cooked, or boiled & drained) Swiss chard (cooked, or boiled & drained) Turnip greens (cooked, or boiled & drained)  Eat a consistent number of servings per week of foods MEDIUM-HIGH in Vitamin K (1 serving = 1 cup)  Asparagus (cooked, or boiled & drained) Broccoli (cooked, boiled & drained, or raw & chopped) Brussel sprouts (cooked, or boiled & drained) *serving size only =  cup Lettuce, raw (green leaf, endive, romaine) Spinach, raw Turnip greens, raw & chopped   These websites have more information on Coumadin (warfarin):  http://www.king-russell.com/www.coumadin.com; https://www.hines.net/www.ahrq.gov/consumer/coumadin.htm;

## 2014-03-06 NOTE — Discharge Summary (Signed)
Discharge Summary   Patient ID: Joseph Carson, MRN: [id number], DOB/AGE: Jan 25, 1956 58 y.o.  Admit date: 03/01/2014 Discharge date: 03/06/2014   Primary Care Physician:  No PCP Per Patient   Primary Cardiologist/CHF:  Dr. Marca Anconaalton McLean   Reason for Admission:  Acute on Chronic Combined Systolic and Diastolic CHF   Primary Discharge Diagnoses:  Principal Problem:   Acute on chronic combined systolic and diastolic heart failure Active Problems:   Ischemic cardiomyopathy EF 15-20%   LV (left ventricular) mural thrombus   NSVT (nonsustained ventricular tachycardia)   Coronary artery disease   Mitral regurgitation   LBBB (left bundle branch block)   Tobacco abuse   HLD (hyperlipidemia)     Wt Readings from Last 3 Encounters:  03/06/14 181 lb 11.2 oz (82.419 kg)  03/06/14 181 lb 11.2 oz (82.419 kg)    Secondary Discharge Diagnoses:   Past Medical History  Diagnosis Date  . Chronic combined systolic and diastolic heart failure November 2014  . Obesity   . Pulmonary hypertension   . Hyperbilirubinemia   . Ischemic cardiomyopathy November 2014    EF 20% by echo 02/2014  . Umbilical hernia   . Ischemic cardiomyopathy     a. echo (10/15):  EF 20%, diff HK, apical AK, mobile density inf wall near papillary muscle (?ruptured) with mod MR, mod reduced RVSF, mild PI, PASP 37 mmHg  . CAD (coronary artery disease)     a. s/p CABG x 3 03/2013 (L-LAD, S-RI, S-PDA) in [place]  . LV (left ventricular) mural thrombus 03/06/2014    a. TEE (10/15):  EF 15-20%, septum and ant walls AK, lat wall HK, no papillary muscle rupture, probable small thrombus on ant/ant-septal wall, mild MR, mod decreased RVSF, neg bubble study  . NSVT (nonsustained ventricular tachycardia) 03/06/2014    a. Life Vest placed 03/06/14  . Mitral regurgitation 03/06/2014    mild by TEE 02/2014  . LBBB (left bundle branch block) 03/06/2014  . HLD (hyperlipidemia) 03/06/2014      Allergies:   No Known Allergies     Procedures Performed This Admission:    Transesophageal Echocardiogram   Hospital Course:  Joseph SiresMichael Carson is a 58 y.o. male with a hx of CAD s/p CABG in [place] in 03/2013, ischemic CM with EF 19%, systolic CHF, tobacco abuse, HL.  He moved to [place] recently to be close to family.  He presented to the hospital 03/01/2014 with symptoms of volume overload.  He has no insurance and had been off all of his medications.  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  He was admitted and placed on a Lasix drip.  He had massive diuresis and was neg 29.6 L at DC.  His weight also decreased by 39 lbs.  CHF medications were added back to his regimen.  Titration was limited by hypotension and bradycardia.  He was noted to have an EF of 20% on his echo.  There was concern for papillary muscle rupture and mod MR.  TEE demonstrated possible LV mural thrombus but no evidence of papillary muscle rupture and only mild MR.  He was started on Lovenox and Coumadin.  He was seen by CM and he was approved for the Med Assist program.  He will need a Rx for all his medications at DC x 30 days (he can take to any pharmacy).  He has been asked to FU with [facility] to get a PCP and further medication refills through  their pharmacy.  Tele did demonstrate NSVT.  He was therefore fitted with a LifeVest prior to DC.  He will need reassessment of his EF in 90 days (after resuming medical therapy for CHF).  If EF is still less than 35%, he will need referral to EP for possible CRT-D (LBBB, QRS 135 ms).  He was seen by Dr. Marca Ancona AM of 03/06/2014 and felt to be stable for DC to home.  He will have close FU in the Coumadin Clinic this week the CHF clinic later this week.  I will give him Coumadin 10 mg today, then continue at 5 mg QD until he has a FU INR.  Of note, he is waiting on the [employer] representative.  After his Life Vest is placed, he will be ready for DC to home.   Discharge Vitals:   Blood pressure  94/59, pulse 65, temperature 97.7 F (36.5 C), temperature source Oral, resp. rate 16, height 5\' 10"  (1.778 m), weight 181 lb 11.2 oz (82.419 kg), SpO2 97.00%.   Labs:   Recent Labs  03/04/14 0350 03/05/14 0447  WBC 6.0 5.3  HGB 14.3 14.3  HCT 45.1 44.8  MCV 89.3 89.8  PLT 146* 150     Recent Labs  03/04/14 0350 03/05/14 0447  NA 140 139  K 5.1 4.5  CL 97 96  CO2 34* 34*  BUN 16 17  CREATININE 1.23 1.12  CALCIUM 9.4 9.2    No results found for this basename: CKTOTAL, CKMB, TROPONINI,  in the last 72 hours  Lab Results  Component Value Date   CHOL 119 03/02/2014   HDL 33* 03/02/2014   LDLCALC 73 03/02/2014   TRIG 63 03/02/2014    No results found for this basename: DDIMER    Lab Results  Component Value Date   TSH 13.440* 03/03/2014     Recent Labs  03/04/14 1129 03/05/14 0447 03/06/14 0330  INR 1.19 1.28 1.47     Diagnostic Procedures and Studies:  Dg Chest 2 View   03/01/2014    IMPRESSION: Cardiomegaly with small pleural effusions and interstitial edema.   Electronically Signed   By: Augusto Gamble M.D.   On: 03/01/2014 09:34     2D Echocardiogram  Echo 03/02/14:  Study Conclusions  - Left ventricle: e/E&'>21 suggestive of elevated LV filling pressures. The cavity size was severely dilated. Systolic function was severely reduced. The estimated ejection fraction was 20%. Diffuse hypokinesis. Akinesis of the entireapical myocardium. - Ventricular septum: Septal motion showed paradox. - Mitral valve: There is a mobile density on the inferior wall near the vicinity of the papillary muscle in the 2 chamber view that is most consistent with ruptured papillary muscle although the location is lower than typical location for a papillary muscle. Consider TEE or cardiac MRI for further evaluation. There was moderate regurgitation. - Right ventricle: The cavity size was mildly dilated. Wall thickness was normal. Systolic function was moderately  reduced. - Pulmonic valve: There was mild regurgitation. - Pulmonary arteries: PA peak pressure: 37 mm Hg (S). - Pericardium, extracardiac: A trivial pericardial effusion was identified posterior to the heart.  Impressions:  - The right ventricular systolic pressure was increased consistent with mild pulmonary hypertension.   TEE 03/04/14:  Findings: Please see echo section for full report. Mildly dilated LV with severe systolic dysfunction, EF 15-20%. The septum and anterior walls are akinetic. The lateral wall is hypokinetic. I do not think that there is a papillary muscle rupture. There  is a small mobile mass attached to the basal anterior/anteroseptal wall. This area is akinetic. I am concerned that this may be a small thrombus. There is mild mitral regurgitation. The aortic valve is trileaflet without stenosis or regurgitation. The RV is mildly dilated with moderately decreased systolic function. Negative bubble study.  Impression: Possible basal anterior/anteroseptal small mobile thrombus in an area of akinesis. Will need anticoagulation, will bridge to therapeutic coumadin with Lovenox.   Disposition:   Pt is being discharged home today in good condition.  Follow-up Plans & Appointments  Follow-up Information   Follow up with [facility]    . (Left a Msg )    Contact information:   [address] [place] [address] [phone]      Follow up with Marca Anconaalton McLean, MD On 03/10/2014. (at 0940 in the [facility]--gate code 0500--bring meds)    Specialty:  Cardiology   Contact information:   [address] [place] [address] [phone]       Follow up with [facility] In 2 days. (the office will call to arrange a visit to check your coumadin)    Contact information:   [address] [place] [id number]       Follow up with [facility]. (home health  nurse)    Contact information:   [address] [place] [address] [phone]       Discharge Medications    Medication List    STOP taking these medications       VITAMIN E PO      TAKE these medications       aspirin EC 81 MG tablet  Take 81 mg by mouth daily.     atorvastatin 40 MG tablet  Commonly known as:  LIPITOR  Take 1 tablet (40 mg total) by mouth daily at 6 PM.     carvedilol 3.125 MG tablet  Commonly known as:  COREG  Take 1 tablet (3.125 mg total) by mouth 2 (two) times daily with a meal.     enoxaparin 100 MG/ML injection  Commonly known as:  LOVENOX  Inject 0.85 mLs (85 mg total) into the skin every 12 (twelve) hours.     furosemide 40 MG tablet  Commonly known as:  LASIX  Take 1 tablet (40 mg total) by mouth daily.     HAIR/SKIN/NAILS PO  Take 1 tablet by mouth daily.     lisinopril 2.5 MG tablet  Commonly known as:  PRINIVIL,ZESTRIL  Take 1 tablet (2.5 mg total) by mouth 2 (two) times daily.     spironolactone 25 MG tablet  Commonly known as:  ALDACTONE  Take 1 tablet (25 mg total) by mouth daily.     warfarin 5 MG tablet  Commonly known as:  COUMADIN  Take 2 tablets (10 mg) at 4 pm on 03/06/14, then take 1 tablet daily at 4 pm (starting 03/07/14)         Outstanding Labs/Studies  1. INR Tuesday 03/08/14   Duration of Discharge Encounter: Greater than 30 minutes including physician and PA time.  Signed, Tereso NewcomerScott Hanna Ra, PA-C   03/06/2014 8:27 AM

## 2014-03-06 NOTE — Progress Notes (Signed)
Primary cardiologist:  Dr. Nicki Guadalajara  CHF:  Dr. Marca Ancona   Subjective:    Patient had CABG in 11/14 in [place], EF 19% by echo at that time. Has not had followup since then with cardiology. He was admitted 03/01/2014 with CHF exacerbation (dyspnea, orthopnea).  Diuresed with Lasix gtt initially.  Weight down 39 lbs since admit.  He is feeling better.  Denies significant dyspnea with ambulation.    Objective:   Temp:  [97.7 F (36.5 C)-97.8 F (36.6 C)] 97.7 F (36.5 C) (10/18 0550) Pulse Rate:  [62-69] 65 (10/18 0550) Resp:  [15-16] 16 (10/18 0550) BP: (85-94)/(53-70) 94/59 mmHg (10/18 0550) SpO2:  [95 %-97 %] 97 % (10/18 0550) Weight:  [181 lb 11.2 oz (82.419 kg)] 181 lb 11.2 oz (82.419 kg) (10/18 0500) Last BM Date: 03/05/14  Filed Weights   03/04/14 0651 03/05/14 0459 03/06/14 0500  Weight: 191 lb (86.637 kg) 185 lb 14.4 oz (84.324 kg) 181 lb 11.2 oz (82.419 kg)    Intake/Output Summary (Last 24 hours) at 03/06/14 0717 Last data filed at 03/06/14 0548  Gross per 24 hour  Intake    840 ml  Output   3375 ml  Net  -2535 ml    Telemetry:  Sinus brady, HR 40-50s  Exam:  General:  NAD  HEENT:  JVP ~ 4-5 cm  Lungs:  CTA no rales  Cardiac:  RRR no S3 no murmur  Abdomen:  Soft, NT  Extremities:  No edema  Lab Results:  Basic Metabolic Panel:  Recent Labs Lab 03/02/14 0400 03/03/14 0253 03/04/14 0350 03/05/14 0447  NA 144 141 140 139  K 4.3 3.6* 5.1 4.5  CL 100 96 97 96  CO2 34* 34* 34* 34*  GLUCOSE 87 90 110* 90  BUN 15 13 16 17   CREATININE 1.10 0.99 1.23 1.12  CALCIUM 9.3 9.3 9.4 9.2  MG  --  1.4*  --  1.8    Liver Function Tests:  Recent Labs Lab 03/01/14 0914  AST 33  ALT 21  ALKPHOS 104  BILITOT 1.7*  PROT 7.5  ALBUMIN 2.9*    CBC:  Recent Labs Lab 03/03/14 0253 03/04/14 0350 03/05/14 0447  WBC 6.8 6.0 5.3  HGB 14.8 14.3 14.3  HCT 45.0 45.1 44.8  MCV 88.4 89.3 89.8  PLT 166 146* 150    Cardiac  Enzymes:  Recent Labs Lab 03/01/14 0914  TROPONINI <0.30    BNP:  Recent Labs  03/01/14 0914 03/05/14 0447  PROBNP 1789.0* 839.8*    Coagulation:  Recent Labs Lab 03/04/14 1129 03/05/14 0447 03/06/14 0330  INR 1.19 1.28 1.47     Medications:   Scheduled Medications: . aspirin EC  81 mg Oral Daily  . atorvastatin  40 mg Oral q1800  . carvedilol  3.125 mg Oral BID WC  . enoxaparin (LOVENOX) injection  1 mg/kg Subcutaneous BID  . furosemide  40 mg Oral Daily  . lisinopril  2.5 mg Oral BID  . sodium chloride  3 mL Intravenous Q12H  . spironolactone  25 mg Oral Daily  . Warfarin - Pharmacist Dosing Inpatient   Does not apply q1800     Infusions: . sodium chloride 20 mL/hr at 03/04/14 1741     PRN Medications:  sodium chloride, acetaminophen, ondansetron (ZOFRAN) IV, sodium chloride   Echo 03/02/14: Study Conclusions  - Left ventricle: e/E&'>21 suggestive of elevated LV filling pressures. The cavity size was severely dilated. Systolic function was severely  reduced. The estimated ejection fraction was 20%. Diffuse hypokinesis. Akinesis of the entireapical myocardium. - Ventricular septum: Septal motion showed paradox. - Mitral valve: There is a mobile density on the inferior wall near the vicinity of the papillary muscle in the 2 chamber view that is most consistent with ruptured papillary muscle although the location is lower than typical location for a papillary muscle. Consider TEE or cardiac MRI for further evaluation. There was moderate regurgitation. - Right ventricle: The cavity size was mildly dilated. Wall thickness was normal. Systolic function was moderately reduced. - Pulmonic valve: There was mild regurgitation. - Pulmonary arteries: PA peak pressure: 37 mm Hg (S). - Pericardium, extracardiac: A trivial pericardial effusion was identified posterior to the heart.  Impressions:  - The right ventricular systolic pressure was increased  consistent with mild pulmonary hypertension.   TEE 03/04/14: Findings: Please see echo section for full report. Mildly dilated LV with severe systolic dysfunction, EF 15-20%. The septum and anterior walls are akinetic. The lateral wall is hypokinetic. I do not think that there is a papillary muscle rupture. There is a small mobile mass attached to the basal anterior/anteroseptal wall. This area is akinetic. I am concerned that this may be a small thrombus. There is mild mitral regurgitation. The aortic valve is trileaflet without stenosis or regurgitation. The RV is mildly dilated with moderately decreased systolic function. Negative bubble study.  Impression: Possible basal anterior/anteroseptal small mobile thrombus in an area of akinesis. Will need anticoagulation, will bridge to therapeutic coumadin with Lovenox.    Assessment:   1.  Acute on Chronic Combined Systolic and Diastolic CHF - down 39 lbs since admit/neg 29.6L  2.  CAD s/p CABG x 3 03/2013 (L-LAD, S-RI, S-PDA)  3.  Ischemic CM with EF 19% (Echo 03/2013) - EF 20% by Echo this admission; Will be candidate for CRT-D as he has LBBB QRS 135 msec. Has not been on med therapy at home, so will likely need 3 months of med tx prior to repeat echo before device implantation  4.  Medical Non-Compliance (financial constraints)  5.  Mitral Regurgitation - Concern for ruptured papillary muscle on TTE with mod MR but mild MR by TEE (not like papillary muscle rupture.  6.  Possible basal anterior/anteroseptal small mobile thrombus in an area of akinesis on TEE - Coumadin/Lovenox started 03/04/14  7.  NSVT - LifeVest to be arranged.  8.  Tobacco Abuse  9.  Hyperlipidemia   Plan/Discussion:    He is much improved.  CHF appears to be compensated.  Clinically, he should be ready for DC to home.  He needs Lovenox bridging to Coumadin.  His INR today is 1.47.  He has been approved for MedAssist.  I have asked his RN to check with Case  Management regarding coverage for Lovenox.  He is a little hesitant to give himself an injection.  If he can get Lovenox for a reasonable cost and he can give himself the injection, he can likely go home today.  I will ask our office [facility] [address] To see him on Tuesday 10/20 to check his INR.    He already has FU with the [facility] clinic on 10/22 - Thursday.  BP and HR will not tolerate further titration of medications.    Continue ASA, Lipitor 40, Coreg 3.125 2x/day, Lovenox 85 2x/day, Lasix 40 daily, Lisinopril 2.5 2x/day, Spironolactone 25 daily, Coumadin at DC.  Anticipate DC after seen by physician this AM.  FU with  CHF this week as planned.   Luna GlasgowSigned,  Scott Weaver, PA-C   03/06/2014 7:17 AM Pager [phone]     Patient seen with PA, agree with the above note.  He will go home with the above med list.  He looks great today, euvolemic.  Will have coumadin clinic followup, followup in CHF clinic later this week.  He will get a Lifevest given NSVT with repeat echo in 3 months on medical therapy.   Marca AnconaDalton Tamey Wanek 03/06/2014 8:51 AM

## 2014-03-07 ENCOUNTER — Encounter (HOSPITAL_COMMUNITY): Payer: Self-pay | Admitting: Cardiology

## 2014-03-08 ENCOUNTER — Ambulatory Visit (INDEPENDENT_AMBULATORY_CARE_PROVIDER_SITE_OTHER): Payer: MEDICAID | Admitting: Pharmacist

## 2014-03-08 DIAGNOSIS — I513 Intracardiac thrombosis, not elsewhere classified: Secondary | ICD-10-CM

## 2014-03-08 DIAGNOSIS — I213 ST elevation (STEMI) myocardial infarction of unspecified site: Secondary | ICD-10-CM

## 2014-03-08 LAB — POCT INR: INR: 2.5

## 2014-03-09 ENCOUNTER — Encounter: Payer: Self-pay | Admitting: Internal Medicine

## 2014-03-09 ENCOUNTER — Ambulatory Visit: Payer: Medicaid Other | Attending: Internal Medicine | Admitting: Internal Medicine

## 2014-03-09 VITALS — BP 95/60 | HR 72 | Temp 97.7°F | Resp 16 | Ht 70.0 in | Wt 189.6 lb

## 2014-03-09 DIAGNOSIS — Z720 Tobacco use: Secondary | ICD-10-CM

## 2014-03-09 DIAGNOSIS — E785 Hyperlipidemia, unspecified: Secondary | ICD-10-CM | POA: Diagnosis not present

## 2014-03-09 DIAGNOSIS — F17210 Nicotine dependence, cigarettes, uncomplicated: Secondary | ICD-10-CM | POA: Diagnosis not present

## 2014-03-09 DIAGNOSIS — Z7982 Long term (current) use of aspirin: Secondary | ICD-10-CM | POA: Diagnosis not present

## 2014-03-09 DIAGNOSIS — I5043 Acute on chronic combined systolic (congestive) and diastolic (congestive) heart failure: Secondary | ICD-10-CM | POA: Insufficient documentation

## 2014-03-09 DIAGNOSIS — F1290 Cannabis use, unspecified, uncomplicated: Secondary | ICD-10-CM | POA: Insufficient documentation

## 2014-03-09 DIAGNOSIS — I213 ST elevation (STEMI) myocardial infarction of unspecified site: Secondary | ICD-10-CM | POA: Insufficient documentation

## 2014-03-09 DIAGNOSIS — I255 Ischemic cardiomyopathy: Secondary | ICD-10-CM | POA: Insufficient documentation

## 2014-03-09 DIAGNOSIS — Z7901 Long term (current) use of anticoagulants: Secondary | ICD-10-CM | POA: Diagnosis not present

## 2014-03-09 DIAGNOSIS — I2510 Atherosclerotic heart disease of native coronary artery without angina pectoris: Secondary | ICD-10-CM | POA: Insufficient documentation

## 2014-03-09 DIAGNOSIS — R7989 Other specified abnormal findings of blood chemistry: Secondary | ICD-10-CM | POA: Diagnosis not present

## 2014-03-09 DIAGNOSIS — I10 Essential (primary) hypertension: Secondary | ICD-10-CM | POA: Insufficient documentation

## 2014-03-09 DIAGNOSIS — Z951 Presence of aortocoronary bypass graft: Secondary | ICD-10-CM | POA: Diagnosis not present

## 2014-03-09 DIAGNOSIS — Z2821 Immunization not carried out because of patient refusal: Secondary | ICD-10-CM | POA: Diagnosis not present

## 2014-03-09 DIAGNOSIS — I513 Intracardiac thrombosis, not elsewhere classified: Secondary | ICD-10-CM

## 2014-03-09 NOTE — Progress Notes (Signed)
Pt comes in per HFU- pt was [facility] for increased sob with LE swelling without PCP or cardiologist Pt was treated for CHF and d/c'd on Lasix 40 mg tab, Spironolactone 25 mg tab,coreg 12.5 mg tab Pt is compliant with taking medications daily Denies SOB or chest pain,swelling Hypotensive 81/55 recheck manual 87/58 72 Pt has external pacer- will be following Cardiologist 03/10/14 @ Heart Failure clinic 2.5 yesterday, doses alternating 2.5 mg,5 mg taB Anticoagulation therapy managed by home nurse visit Denies bleeding or feeling dizzy with low BP

## 2014-03-09 NOTE — Progress Notes (Signed)
Patient Demographics  Joseph Carson, is a 58 y.o. male  ZOX:[id number]  WJX:[id number]  DOB - [birth year]  CC:  Chief Complaint  Patient presents with  . Hospitalization Follow-up  . Congestive Heart Failure  . Coronary Artery Disease  . Hypertension       HPI: Anderson Middlebrooks is a 58 y.o. male here today to establish medical care.Patient has history of CAD status post CABG, heart failure, ischemic cardiomyopathy, LV thrombus,, hyperlipidemia, recently hospitalized with symptoms of volume overload, EMR reviewed patient was treated with Lasix drip, since prior to the hospitalization patient then out of his medications which were recently added and titrated patient has borderline low blood pressure but is asymptomatic, denies any headache dizziness chest and shortness of breath, his recent echo showed EF of 20% patient has left life vest patient also has history of LV thrombus and is on Coumadin being followed at the Coumadin clinic, denies any bleeding, patient is already scheduled to follow with heart failure clinic tomorrow. Patient denies any orthopnea or PND. His manual blood pressure with adult cuff is 95/60, patient is asymptomatic. Patient has No headache, No chest pain, No abdominal pain - No Nausea, No new weakness tingling or numbness, No Cough - SOB.  No Known Allergies Past Medical History  Diagnosis Date  . Chronic combined systolic and diastolic heart failure November 2014  . Obesity   . Pulmonary hypertension   . Hyperbilirubinemia   . Ischemic cardiomyopathy November 2014    EF 20% by echo 02/2014  . Umbilical hernia   . Ischemic cardiomyopathy     a. echo (10/15):  EF 20%, diff HK, apical AK, mobile density inf wall near papillary muscle (?ruptured) with mod MR, mod reduced RVSF, mild PI, PASP 37 mmHg  . CAD (coronary artery disease)     a. s/p CABG x 3 03/2013 (L-LAD, S-RI, S-PDA) in [place]  . LV (left ventricular) mural thrombus 03/06/2014    a. TEE (10/15):   EF 15-20%, septum and ant walls AK, lat wall HK, no papillary muscle rupture, probable small thrombus on ant/ant-septal wall, mild MR, mod decreased RVSF, neg bubble study  . NSVT (nonsustained ventricular tachycardia) 03/06/2014    a. Life Vest placed 03/06/14  . Mitral regurgitation 03/06/2014    mild by TEE 02/2014  . LBBB (left bundle branch block) 03/06/2014  . HLD (hyperlipidemia) 03/06/2014   Current Outpatient Prescriptions on File Prior to Visit  Medication Sig Dispense Refill  . aspirin EC 81 MG tablet Take 81 mg by mouth daily.      Marland Kitchen atorvastatin (LIPITOR) 40 MG tablet Take 1 tablet (40 mg total) by mouth daily at 6 PM.  30 tablet  6  . carvedilol (COREG) 3.125 MG tablet Take 1 tablet (3.125 mg total) by mouth 2 (two) times daily with a meal.  60 tablet  6  . furosemide (LASIX) 40 MG tablet Take 1 tablet (40 mg total) by mouth daily.  30 tablet  0  . lisinopril (PRINIVIL,ZESTRIL) 2.5 MG tablet Take 1 tablet (2.5 mg total) by mouth 2 (two) times daily.  60 tablet  0  . spironolactone (ALDACTONE) 25 MG tablet Take 1 tablet (25 mg total) by mouth daily.  30 tablet  0  . warfarin (COUMADIN) 5 MG tablet Take 2 tablets (10 mg) at 4 pm on 03/06/14, then take 1 tablet daily at 4 pm (starting 03/07/14)  30 tablet  0  . enoxaparin (LOVENOX) 100 MG/ML injection Inject 0.85  mLs (85 mg total) into the skin every 12 (twelve) hours.  6 Syringe  1  . Multiple Vitamins-Minerals (HAIR/SKIN/NAILS PO) Take 1 tablet by mouth daily.       No current facility-administered medications on file prior to visit.   Family History  Problem Relation Age of Onset  . Diabetes Mother     Deceased  . Thyroid disease Mother    History   Social History  . Marital Status: Single    Spouse Name: N/A    Number of Children: N/A  . Years of Education: N/A   Occupational History  . Not on file.   Social History Main Topics  . Smoking status: Current Some Day Smoker -- 0.10 packs/day for 43 years    Types:  Cigarettes  . Smokeless tobacco: Never Used     Comment: admits to smoking 1cigarette/day  . Alcohol Use: No  . Drug Use: Yes    Special: Marijuana     Comment: 03/01/2014 "used last ~ 2 months ago"  . Sexual Activity: Not Currently   Other Topics Concern  . Not on file   Social History Narrative  . No narrative on file    Review of Systems: Constitutional: Negative for fever, chills, diaphoresis, activity change, appetite change and fatigue. HENT: Negative for ear pain, nosebleeds, congestion, facial swelling, rhinorrhea, neck pain, neck stiffness and ear discharge.  Eyes: Negative for pain, discharge, redness, itching and visual disturbance. Respiratory: Negative for cough, choking, chest tightness, shortness of breath, wheezing and stridor.  Cardiovascular: Negative for chest pain, palpitations and leg swelling. Gastrointestinal: Negative for abdominal distention. Genitourinary: Negative for dysuria, urgency, frequency, hematuria, flank pain, decreased urine volume, difficulty urinating and dyspareunia.  Musculoskeletal: Negative for back pain, joint swelling, arthralgia and gait problem. Neurological: Negative for dizziness, tremors, seizures, syncope, facial asymmetry, speech difficulty, weakness, light-headedness, numbness and headaches.  Hematological: Negative for adenopathy. Does not bruise/bleed easily. Psychiatric/Behavioral: Negative for hallucinations, behavioral problems, confusion, dysphoric mood, decreased concentration and agitation.    Objective:   Filed Vitals:   03/09/14 1045  BP: 95/60  Pulse:   Temp:   Resp:     Physical Exam: Constitutional: Patient appears well-developed and well-nourished. No distress. HENT: Normocephalic, atraumatic, External right and left ear normal. Oropharynx is clear and moist.  Eyes: Conjunctivae and EOM are normal. PERRLA, no scleral icterus. Neck: Normal ROM. Neck supple. No JVD. No tracheal deviation. No  thyromegaly. CVS: RRR, S1/S2 +, no murmurs, no gallops, no carotid bruit.  Pulmonary: Effort and breath sounds normal, no stridor, rhonchi, wheezes, rales.  Abdominal: Soft. BS +, no distension, tenderness, rebound or guarding.  Musculoskeletal: Normal range of motion. No edema and no tenderness.  Neuro: Alert. Normal reflexes, muscle tone coordination. No cranial nerve deficit. Skin: Skin is warm and dry. No rash noted. Not diaphoretic. No erythema. No pallor. Psychiatric: Normal mood and affect. Behavior, judgment, thought content normal.  Lab Results  Component Value Date   WBC 5.3 03/05/2014   HGB 14.3 03/05/2014   HCT 44.8 03/05/2014   MCV 89.8 03/05/2014   PLT 150 03/05/2014   Lab Results  Component Value Date   CREATININE 1.12 03/05/2014   BUN 17 03/05/2014   NA 139 03/05/2014   K 4.5 03/05/2014   CL 96 03/05/2014   CO2 34* 03/05/2014    No results found for this basename: HGBA1C   Lipid Panel     Component Value Date/Time   CHOL 119 03/02/2014 0400   TRIG  63 03/02/2014 0400   HDL 33* 03/02/2014 0400   CHOLHDL 3.6 03/02/2014 0400   VLDL 13 03/02/2014 0400   LDLCALC 73 03/02/2014 0400       Assessment and plan:   1. Ischemic cardiomyopathy EF 15-20%/Coronary artery disease /S/P CABG/5. Acute on chronic combined systolic and diastolic heart failure  Currently patient is on aspirin statin Coreg Lasix ACE inhibitor Aldactone. Patient is scheduled to follow up at  heart failure clinic tomorrow.  3. Tobacco abuse Advise patient to quit smoking.  4. HLD (hyperlipidemia) Currently patient is on Lipitor 40 mg, will do fasting lipid panel on the next visit.   6. LV (left ventricular) mural thrombus Currently patient is on Coumadin, denies any bleeding following up with Coumadin clinic.  7. Abnormal TSH We'll do full TFT panel on  the following visit      Health Maintenance Patient declines flu shot and pneumovax  Return in about 2 months (around  05/09/2014).   Doris CheadleADVANI, Teondra [place]burg, MD

## 2014-03-09 NOTE — Discharge Planning (Signed)
[facility]ist not able to see patient, [facility] orange card information will be sent to the address provided. °

## 2014-03-10 ENCOUNTER — Encounter: Payer: Self-pay | Admitting: Licensed Clinical Social Worker

## 2014-03-10 ENCOUNTER — Encounter (HOSPITAL_COMMUNITY): Payer: Self-pay

## 2014-03-10 ENCOUNTER — Ambulatory Visit (HOSPITAL_COMMUNITY)
Admit: 2014-03-10 | Discharge: 2014-03-10 | Disposition: A | Discharge status: Home / Self Care | Payer: Medicaid Other | Attending: Internal Medicine | Admitting: Internal Medicine

## 2014-03-10 VITALS — BP 102/56 | HR 78 | Wt 187.8 lb

## 2014-03-10 DIAGNOSIS — Z7901 Long term (current) use of anticoagulants: Secondary | ICD-10-CM | POA: Insufficient documentation

## 2014-03-10 DIAGNOSIS — I4729 Other ventricular tachycardia: Secondary | ICD-10-CM

## 2014-03-10 DIAGNOSIS — Z951 Presence of aortocoronary bypass graft: Secondary | ICD-10-CM | POA: Diagnosis not present

## 2014-03-10 DIAGNOSIS — Z720 Tobacco use: Secondary | ICD-10-CM

## 2014-03-10 DIAGNOSIS — I5022 Chronic systolic (congestive) heart failure: Secondary | ICD-10-CM | POA: Diagnosis present

## 2014-03-10 DIAGNOSIS — I255 Ischemic cardiomyopathy: Secondary | ICD-10-CM | POA: Diagnosis not present

## 2014-03-10 DIAGNOSIS — E785 Hyperlipidemia, unspecified: Secondary | ICD-10-CM | POA: Diagnosis not present

## 2014-03-10 DIAGNOSIS — I8290 Acute embolism and thrombosis of unspecified vein: Secondary | ICD-10-CM | POA: Insufficient documentation

## 2014-03-10 DIAGNOSIS — Z79899 Other long term (current) drug therapy: Secondary | ICD-10-CM | POA: Insufficient documentation

## 2014-03-10 DIAGNOSIS — Z7982 Long term (current) use of aspirin: Secondary | ICD-10-CM | POA: Insufficient documentation

## 2014-03-10 DIAGNOSIS — F17210 Nicotine dependence, cigarettes, uncomplicated: Secondary | ICD-10-CM | POA: Diagnosis not present

## 2014-03-10 DIAGNOSIS — I5043 Acute on chronic combined systolic (congestive) and diastolic (congestive) heart failure: Secondary | ICD-10-CM

## 2014-03-10 DIAGNOSIS — I2510 Atherosclerotic heart disease of native coronary artery without angina pectoris: Secondary | ICD-10-CM | POA: Insufficient documentation

## 2014-03-10 DIAGNOSIS — I472 Ventricular tachycardia: Secondary | ICD-10-CM | POA: Insufficient documentation

## 2014-03-10 LAB — BASIC METABOLIC PANEL
Anion gap: 10 (ref 5–15)
BUN: 18 mg/dL (ref 6–23)
CALCIUM: 9.8 mg/dL (ref 8.4–10.5)
CHLORIDE: 97 meq/L (ref 96–112)
CO2: 27 mEq/L (ref 19–32)
Creatinine, Ser: 1.03 mg/dL (ref 0.50–1.35)
GFR calc Af Amer: >90 mL/min (ref >90)
GFR calc non Af Amer: 78 mL/min — ABNORMAL LOW (ref >90)
Glucose, Bld: 72 mg/dL (ref 70–99)
Potassium: 5.1 mEq/L (ref 3.7–5.3)
SODIUM: 134 meq/L — AB (ref 137–147)

## 2014-03-10 LAB — PRO B NATRIURETIC PEPTIDE: PRO B NATRI PEPTIDE: 961.5 pg/mL — AB (ref 0–125)

## 2014-03-10 MED ORDER — CARVEDILOL 6.25 MG PO TABS: 6.2500 mg | ORAL_TABLET | Freq: Two times a day (BID) | ORAL | Status: DC

## 2014-03-10 NOTE — Progress Notes (Signed)
CSW referred to assist patient with questions regarding social security disability. CSW met with patient and daughter in the clinic. Patient reports he can no longer work. He stated that he has been doing a little work here and there since "I had heart surgery last November" but had to quit his full time position last Fall. Patient currently has no income and no insurance. Patient reports he met with social worker from [employer] who instructed on th application process. Patient was seen yesterday ay [facility] and has a pending application for the [employer] card to bridge the healthcare gap until disability processed. CSW discussed lengthy process and support services available if needed. Patient and daughter verbalize understanding of follow up needed and will contact CSW if furthter needs arise. CSW continues to be available as needed. Raquel Sarna, [place]

## 2014-03-10 NOTE — Patient Instructions (Signed)
Labs today  INCREASE Coreg to 6.25mg  twice a day  [facility], thwy will contact you to schedule orientation  Your physician recommends that you schedule a follow-up appointment in: 2 weeks  Your physician has requested that you have an echocardiogram. Echocardiography is a painless test that uses sound waves to create images of your heart. It provides your doctor with information about the size and shape of your heart and how well your heart's chambers and valves are working. This procedure takes approximately one hour. There are no restrictions for this procedure. DUE IN 3 MONTHS  Do the following things EVERYDAY: 1) Weigh yourself in the morning before breakfast. Write it down and keep it in a log. 2) Take your medicines as prescribed 3) Eat low salt foods-Limit salt (sodium) to 2000 mg per day.  4) Stay as active as you can everyday 5) Limit all fluids for the day to less than 2 liters 6)

## 2014-03-10 NOTE — Progress Notes (Signed)
Patient ID: Sherene SiresMichael Forstner, male   DOB: September 06, 1955, 58 y.o.   MRN: [id number] PCP: Dr. Orpah CobbAdvani  58 yo with history of CAD s/p CABG and ischemic cardiomyopathy presents for hospital followup.  In 11/14, he had CABG in [place].  Echo at that time showed EF "19%."  He then left [place] and moved to [place] to live with his daughter and did not have any further medical contact until earlier this month.  He was not taking any medications.  He gained about 50 lbs over this period.  He became severely short of breath gradually along with significant orthopnea.  No chest pain.  He finally came to the ER and was admitted with acute on chronic systolic CHF and massive volume overload.  EF was 20% by echo with small LV thrombus seen on TEE.  RV was mildly dilated with moderately decreased systolic function.  Patient was diuresed with IV Lasix and started back on cardiac meds.  He lost almost 50 lbs in the hospital.    Since going home, patient has done well.  Weight has remained stable.  He is taking all his medications.  No lightheadedness.  No orthopnea/PND. No chest pain.  He is walking without exertional dyspnea and has been able to get around stores with no problems.  NSVT was seen in the hospital so he is wearing a Lifevest.    ECG: NSR, IVCD (135 msec)  Labs (10/15): K 5.1, creatinine 1.03  PMH: 1. CAD: CABG x 3 11/14 in [place], LIMA-LAD/SVG-ramus/SVG-PDA.   2. Ischemic cardiomyopathy: 11/14 echo [facility]([place]) with EF 19%.  Echo (10/15) with EF 20%, severe diffuse hypokinesis with akinesis of apex, mild MR, mildly dilated RV with moderately decreased systolic function.  TEE (10/15) with EF 15-20%, septal and anterior akinesis, basal anterior/basal anteroseptal small thrombus attached to an akinetic segment, mild MR.  3. LV thrombus: Diagnosed by TEE 10/15.  4. NSVT  SH: Smoker (down to 1 cigarette/day), [place], [place] from [place] 2015.    FH: CAD  ROS: All systems reviewed and negative  except as per HPI.   Current Outpatient Prescriptions  Medication Sig Dispense Refill  . aspirin EC 81 MG tablet Take 81 mg by mouth daily.      Marland Kitchen. atorvastatin (LIPITOR) 40 MG tablet Take 1 tablet (40 mg total) by mouth daily at 6 PM.  30 tablet  6  . carvedilol (COREG) 6.25 MG tablet Take 1 tablet (6.25 mg total) by mouth 2 (two) times daily with a meal.  60 tablet  6  . furosemide (LASIX) 40 MG tablet Take 1 tablet (40 mg total) by mouth daily.  30 tablet  0  . lisinopril (PRINIVIL,ZESTRIL) 2.5 MG tablet Take 1 tablet (2.5 mg total) by mouth 2 (two) times daily.  60 tablet  0  . Multiple Vitamins-Minerals (HAIR/SKIN/NAILS PO) Take 1 tablet by mouth daily.      Marland Kitchen. spironolactone (ALDACTONE) 25 MG tablet Take 1 tablet (25 mg total) by mouth daily.  30 tablet  0  . warfarin (COUMADIN) 5 MG tablet Take 2 tablets (10 mg) at 4 pm on 03/06/14, then take 1 tablet daily at 4 pm (starting 03/07/14) or as adirected       No current facility-administered medications for this encounter.    BP 102/56  Pulse 78  Wt 187 lb 12.8 oz (85.186 kg)  SpO2 100% General: NAD Neck: JVP 8 cm, no thyromegaly or thyroid nodule.  Lungs: Clear to  auscultation bilaterally with normal respiratory effort. CV: Nondisplaced PMI.  Heart regular S1/S2, no S3/S4, no murmur.  No peripheral edema.  No carotid bruit.  Normal pedal pulses.  Abdomen: Soft, nontender, no hepatosplenomegaly, no distention.  Skin: Intact without lesions or rashes.  Neurologic: Alert and oriented x 3.  Psych: Normal affect. Extremities: No clubbing or cyanosis.  HEENT: Normal.   Assessment/Plan: 1. CAD: No chest pain.  S/p CABG 11/14.   - Continue ASA 81 and statin.  Will likely stop ASA eventually as he is on warfarin as well now.  2. Hyperlipidemia: He is back on statin.  Goal LDL < 70.  Will check lipids/LFTs in 2 months.  3. Chronic systolic CHF: Recent admission with massive volume overload.  He was off all meds.  EF 20% with moderately  decreased RV systolic function.  Currently NYHA class II with probably mild residual volume overload after almost 50 lb diuresis.  - Continue Lasix 40 mg daily for now.  - BMET/BNP today.  - Continue current lisinopril and spironolactone.  - Carefully increase Coreg to 6.25 mg bid.  - He will need repeat echo in 3 months on medical therapy (had not been on any meds prior to last admission).  If EF < 35%, will need ICD.  Given IVCD with QRS 125 msec, would consider CRT.  4. NSVT: Noted in hospital.  Coreg started and he has a Lifevest which he will wear until followup echo and decision on ICD.  5. LV thrombus: Small thrombus noted on the akinetic basal anterior/anteroseptal wall.  Initially thought to be ruptured papillary muscle but by TEE looked like thrombus.  He is now on coumadin.  6. Smoking: He is working on cessation, down to 1/day. I encouraged him to stop entirely.  7. I am going to have him talk to the social worker for help getting insurance and will refer him to cardiac rehab.   Marca AnconaDalton Lourene Hoston 03/10/2014

## 2014-03-11 ENCOUNTER — Telehealth: Payer: Self-pay | Admitting: Cardiovascular Disease

## 2014-03-11 ENCOUNTER — Ambulatory Visit (INDEPENDENT_AMBULATORY_CARE_PROVIDER_SITE_OTHER): Payer: MEDICAID | Admitting: Interventional Cardiology

## 2014-03-11 DIAGNOSIS — I513 Intracardiac thrombosis, not elsewhere classified: Secondary | ICD-10-CM

## 2014-03-11 DIAGNOSIS — I213 ST elevation (STEMI) myocardial infarction of unspecified site: Secondary | ICD-10-CM

## 2014-03-11 LAB — POCT INR: INR: 2.5

## 2014-03-12 ENCOUNTER — Telehealth: Payer: Self-pay | Admitting: Physician Assistant

## 2014-03-12 NOTE — Telephone Encounter (Signed)
I spoke to [place] [facility].  INr on 10/20 was 2.5 and the patient took 2.5 mg of Coumadin yesterday. Today the patient's INR is 2.5 as well and he took 2.5 mg .   The instructions from the coumadin clinic are 5mg  S, M, W, F, and 2.5mg  T, TH, Sat.  I went over this with Mr. Allison Quarryrendt on the phone today.   Wilburt FinlayHAGER, Chaney Maclaren PAC

## 2014-03-16 ENCOUNTER — Ambulatory Visit (INDEPENDENT_AMBULATORY_CARE_PROVIDER_SITE_OTHER): Payer: MEDICAID | Admitting: Pharmacist

## 2014-03-16 DIAGNOSIS — I213 ST elevation (STEMI) myocardial infarction of unspecified site: Secondary | ICD-10-CM

## 2014-03-16 DIAGNOSIS — I513 Intracardiac thrombosis, not elsewhere classified: Secondary | ICD-10-CM

## 2014-03-16 LAB — POCT INR: INR: 2.2

## 2014-03-17 ENCOUNTER — Telehealth (HOSPITAL_COMMUNITY): Payer: Self-pay | Admitting: *Deleted

## 2014-03-23 ENCOUNTER — Ambulatory Visit (INDEPENDENT_AMBULATORY_CARE_PROVIDER_SITE_OTHER): Payer: MEDICAID | Admitting: Cardiovascular Disease

## 2014-03-23 DIAGNOSIS — I513 Intracardiac thrombosis, not elsewhere classified: Secondary | ICD-10-CM

## 2014-03-23 DIAGNOSIS — I213 ST elevation (STEMI) myocardial infarction of unspecified site: Secondary | ICD-10-CM

## 2014-03-23 LAB — POCT INR: INR: 1.5

## 2014-03-25 ENCOUNTER — Encounter (HOSPITAL_COMMUNITY): Payer: Self-pay

## 2014-03-25 ENCOUNTER — Ambulatory Visit: Payer: Self-pay | Attending: Internal Medicine

## 2014-03-25 ENCOUNTER — Ambulatory Visit (HOSPITAL_COMMUNITY)
Admission: RE | Admit: 2014-03-25 | Discharge: 2014-03-25 | Disposition: A | Discharge status: Home / Self Care | Payer: Medicaid Other | Source: Ambulatory Visit | Attending: Cardiology | Admitting: Cardiology

## 2014-03-25 VITALS — BP 92/58 | HR 64 | Wt 181.0 lb

## 2014-03-25 DIAGNOSIS — I2510 Atherosclerotic heart disease of native coronary artery without angina pectoris: Secondary | ICD-10-CM | POA: Diagnosis present

## 2014-03-25 DIAGNOSIS — Z951 Presence of aortocoronary bypass graft: Secondary | ICD-10-CM | POA: Diagnosis not present

## 2014-03-25 DIAGNOSIS — F17210 Nicotine dependence, cigarettes, uncomplicated: Secondary | ICD-10-CM | POA: Insufficient documentation

## 2014-03-25 DIAGNOSIS — I213 ST elevation (STEMI) myocardial infarction of unspecified site: Secondary | ICD-10-CM | POA: Insufficient documentation

## 2014-03-25 DIAGNOSIS — E785 Hyperlipidemia, unspecified: Secondary | ICD-10-CM | POA: Insufficient documentation

## 2014-03-25 DIAGNOSIS — I472 Ventricular tachycardia: Secondary | ICD-10-CM | POA: Diagnosis not present

## 2014-03-25 DIAGNOSIS — I5022 Chronic systolic (congestive) heart failure: Secondary | ICD-10-CM | POA: Diagnosis not present

## 2014-03-25 DIAGNOSIS — Z720 Tobacco use: Secondary | ICD-10-CM

## 2014-03-25 LAB — BASIC METABOLIC PANEL
ANION GAP: 14 (ref 5–15)
BUN: 26 mg/dL — ABNORMAL HIGH (ref 6–23)
CALCIUM: 10.1 mg/dL (ref 8.4–10.5)
CO2: 28 meq/L (ref 19–32)
CREATININE: 1.09 mg/dL (ref 0.50–1.35)
Chloride: 92 mEq/L — ABNORMAL LOW (ref 96–112)
GFR calc Af Amer: 85 mL/min — ABNORMAL LOW (ref >90)
GFR calc non Af Amer: 73 mL/min — ABNORMAL LOW (ref >90)
Glucose, Bld: 69 mg/dL — ABNORMAL LOW (ref 70–99)
Potassium: 5 mEq/L (ref 3.7–5.3)
Sodium: 134 mEq/L — ABNORMAL LOW (ref 137–147)

## 2014-03-25 MED ORDER — LISINOPRIL 2.5 MG PO TABS: ORAL_TABLET | ORAL | Status: DC

## 2014-03-25 NOTE — Patient Instructions (Addendum)
Labs today and again in 10 days (BMET) STOP Asprin INCREASE Lisinopril to 2.5mg  in the AM and 5 mg in the PM  Your physician has requested that you have an echocardiogram. Echocardiography is a painless test that uses sound waves to create images of your heart. It provides your doctor with information about the size and shape of your heart and how well your heart's chambers and valves are working. This procedure takes approximately one hour. There are no restrictions for this procedure.  Your physician recommends that you schedule a follow-up appointment in: 1 month with NP  Do the following things EVERYDAY: 1) Weigh yourself in the morning before breakfast. Write it down and keep it in a log. 2) Take your medicines as prescribed 3) Eat low salt foods-Limit salt (sodium) to 2000 mg per day.  4) Stay as active as you can everyday 5) Limit all fluids for the day to less than 2 liters 6)

## 2014-03-27 NOTE — Progress Notes (Signed)
Patient ID: Joseph SiresMichael Carson, male   DOB: 04-06-1956, 58 y.o.   MRN: [id number] PCP: Dr. Orpah CobbAdvani  58 yo with history of CAD s/p CABG and ischemic cardiomyopathy presents for hospital followup.  In 11/14, [place] in [place]  Echo at that time showed EF "19%."  He then left [place] and moved to [place] to live with his daughter and did not have any further medical contact until earlier this month.  He was not taking any medications.  He gained about 50 lbs over this period.  He became severely short of breath gradually along with significant orthopnea.  No chest pain.  He finally came to the ER and was admitted with acute on chronic systolic CHF and massive volume overload.  EF was 20% by echo with small LV thrombus seen on TEE.  RV was mildly dilated with moderately decreased systolic function.  Patient was diuresed with IV Lasix and started back on cardiac meds.  He lost almost 50 lbs in the hospital.    Patient continues to do well.  Weight is down 6 more lbs.  He is taking all his medications.  No lightheadedness.  No orthopnea/PND. No chest pain.  He is walking without exertional dyspnea and has been able to get around stores with no problems.  NSVT was seen in the hospital so he is wearing a Lifevest.    ECG: NSR, IVCD (135 msec)  Labs (10/15): K 5.1, creatinine 1.03, BNP 962  PMH: 1. CAD: CAB[place] x 3 11/14 [place], LIMA-LAD/SVG-ramus/SVG-PDA.   2. Ischemic cardiomyopathy: 11/14 echo [facility]([place]) with EF 19%.  Echo (10/15) with EF 20%, severe diffuse hypokinesis with akinesis of apex, mild MR, mildly dilated RV with moderately decreased systolic function.  TEE (10/15) with EF 15-20%, septal and anterior akinesis, basal anterior/basal anteroseptal small thrombus attached to an akinetic segment, mild MR.  3. LV thrombus: Diagnosed by TEE 10/15.  4. NSVT  SH: Smoker (down to 1 cigarette/day), [place], [place] from [place].    FH: CAD  ROS: All systems reviewed and negative except  as per HPI.   Current Outpatient Prescriptions  Medication Sig Dispense Refill  . atorvastatin (LIPITOR) 40 MG tablet Take 1 tablet (40 mg total) by mouth daily at 6 PM. 30 tablet 6  . carvedilol (COREG) 6.25 MG tablet Take 1 tablet (6.25 mg total) by mouth 2 (two) times daily with a meal. 60 tablet 6  . furosemide (LASIX) 40 MG tablet Take 1 tablet (40 mg total) by mouth daily. 30 tablet 0  . lisinopril (PRINIVIL,ZESTRIL) 2.5 MG tablet Take 2.5 mg in the AM and 5 mg in the PM 90 tablet 0  . Multiple Vitamins-Minerals (HAIR/SKIN/NAILS PO) Take 1 tablet by mouth daily.    Marland Kitchen. spironolactone (ALDACTONE) 25 MG tablet Take 1 tablet (25 mg total) by mouth daily. 30 tablet 0  . warfarin (COUMADIN) 5 MG tablet Take 2 tablets (10 mg) at 4 pm on 03/06/14, then take 1 tablet daily at 4 pm (starting 03/07/14) or as adirected     No current facility-administered medications for this encounter.    BP 92/58 mmHg  Pulse 64  Wt 181 lb (82.101 kg)  SpO2 97% General: NAD Neck: JVP 7 cm, no thyromegaly or thyroid nodule.  Lungs: Clear to auscultation bilaterally with normal respiratory effort. CV: Nondisplaced PMI.  Heart regular S1/S2, no S3/S4, no murmur.  No peripheral edema.  No carotid bruit.  Normal pedal pulses.  Abdomen: Soft, nontender, no hepatosplenomegaly, no distention.  Skin: Intact without lesions or rashes.  Neurologic: Alert and oriented x 3.  Psych: Normal affect. Extremities: No clubbing or cyanosis.  HEENT: Normal.   Assessment/Plan: 1. CAD: No chest pain.  S/p CABG 11/14.   - Continue statin.  Can stop ASA with stable CAD and use of warfarin.  2. Hyperlipidemia: He is back on statin.  Goal LDL < 70.  Will check lipids/LFTs in 1/15.  3. Chronic systolic CHF: Recent admission with massive volume overload.  He was off all meds.  EF 20% with moderately decreased RV systolic function.  Currently NYHA class II, looks euvolemic.  - Continue Lasix 40 mg daily for now.  - Increase  lisinopril to 2.5 qam, 5 qpm. - BMET today and repeat in 2 wks.  - Continue current spironolactone and Coreg.  - He will need repeat echo in 1/15 on medical therapy (had not been on any meds prior to last admission).  If EF < 35%, will need ICD.  Given IVCD with QRS 135 msec, would consider CRT.  4. NSVT: Noted in hospital.  Coreg started and he has a Lifevest which he will wear until followup echo and decision on ICD.  5. LV thrombus: Small thrombus noted on the akinetic basal anterior/anteroseptal wall.  Initially thought to be ruptured papillary muscle but by TEE looked like thrombus.  He is now on coumadin.  6. Smoking: He is working on cessation, down to 1/day. I encouraged him to stop entirely.   Followup 1 month with NP.  [name] 03/27/2014

## 2014-03-31 ENCOUNTER — Ambulatory Visit (INDEPENDENT_AMBULATORY_CARE_PROVIDER_SITE_OTHER): Payer: MEDICAID | Admitting: Pharmacist

## 2014-03-31 ENCOUNTER — Telehealth (HOSPITAL_COMMUNITY): Payer: Self-pay | Admitting: Vascular Surgery

## 2014-03-31 DIAGNOSIS — I513 Intracardiac thrombosis, not elsewhere classified: Secondary | ICD-10-CM

## 2014-03-31 DIAGNOSIS — I213 ST elevation (STEMI) myocardial infarction of unspecified site: Secondary | ICD-10-CM

## 2014-03-31 LAB — POCT INR: INR: 2

## 2014-03-31 NOTE — Telephone Encounter (Signed)
Will forward to Dr. Shirlee LatchMcLean to ask for his input since he recently saw patient last Friday with med changes.

## 2014-03-31 NOTE — Telephone Encounter (Signed)
[facility].Marland Kitchen.  VITALS BP 74/42 HR 56 no weight gain, coughing up a little sputum .Marland Kitchen. No crackling in lungs..Marland Kitchen

## 2014-04-01 ENCOUNTER — Telehealth (HOSPITAL_COMMUNITY): Payer: Self-pay

## 2014-04-01 ENCOUNTER — Ambulatory Visit (HOSPITAL_COMMUNITY)
Admission: RE | Admit: 2014-04-01 | Discharge: 2014-04-01 | Disposition: A | Discharge status: Home / Self Care | Payer: Medicaid Other | Source: Ambulatory Visit | Attending: Internal Medicine | Admitting: Internal Medicine

## 2014-04-01 ENCOUNTER — Ambulatory Visit (HOSPITAL_BASED_OUTPATIENT_CLINIC_OR_DEPARTMENT_OTHER): Payer: Medicaid Other | Admitting: Internal Medicine

## 2014-04-01 VITALS — BP 93/52 | HR 87 | Temp 98.3°F | Resp 24

## 2014-04-01 DIAGNOSIS — R05 Cough: Secondary | ICD-10-CM | POA: Diagnosis not present

## 2014-04-01 DIAGNOSIS — R079 Chest pain, unspecified: Secondary | ICD-10-CM | POA: Insufficient documentation

## 2014-04-01 DIAGNOSIS — R059 Cough, unspecified: Secondary | ICD-10-CM

## 2014-04-01 DIAGNOSIS — I5022 Chronic systolic (congestive) heart failure: Secondary | ICD-10-CM

## 2014-04-01 DIAGNOSIS — R042 Hemoptysis: Secondary | ICD-10-CM

## 2014-04-01 MED ORDER — BENZONATATE 100 MG PO CAPS: 100.0000 mg | ORAL_CAPSULE | Freq: Three times a day (TID) | ORAL | Status: DC | PRN

## 2014-04-01 MED ORDER — LISINOPRIL 2.5 MG PO TABS: 2.5000 mg | ORAL_TABLET | Freq: Two times a day (BID) | ORAL | Status: DC

## 2014-04-01 MED ORDER — AMOXICILLIN-POT CLAVULANATE 875-125 MG PO TABS: 1.0000 | ORAL_TABLET | Freq: Two times a day (BID) | ORAL | Status: DC

## 2014-04-01 NOTE — Telephone Encounter (Signed)
patienit's daughter called c/o patient having blood tinged sputum that he is coughing up today.  Verified aspirin was stopped, correct coumadin dose, stable INR at 2.0.  Weight at 178lb which is stable for him, short of breath, no edema noted.  Per Ulla PotashAli Cosgrove NP-C, advised to go to primary MD or urgent care/primary care facility for possible bronchitis/pneumonia/respiratory illness.  Also instructed to resume previous dose of lisinopril at 2.5mg  BID for dizziness and low BP 70/40s.  Aware and agreeable.  Joseph FilterBradley, Joseph Carson

## 2014-04-01 NOTE — Progress Notes (Signed)
Patient walked in with daughter c/o cough for 4 days  States began with brown sputum but last PM began having blood-tinged sputum Patient brought in tissues to show blood-tinged sputum States he's had some chills and fatigue, unable to sleep due to cough Denies fever, sore throat, or body aches States he smokes 1 cig/day but hasn't smoked in last 3 days External defibrillator present  BP 93/52.  P 87 R 24 T 98.3 oral SPO2 95%  Patient is audibly wheezing and SHOB Wheezes noted in both upper lobes Diminished breath sounds noted at both bases Crackles noted RML  Per PCP:  Chest x-ray Augmentin 875 mg 1 tab po bid for 10 days #20 with no refills Tessalon 100 mg 1 cap po tid prn cough #30 with no refills [facility]  Agree with RN's assessment and intervention  Doris Cheadleeepak Advani, MD

## 2014-04-01 NOTE — Patient Instructions (Signed)
Cough, Adult  A cough is a reflex that helps clear your throat and airways. It can help heal the body or may be a reaction to an irritated airway. A cough may only last 2 or 3 weeks (acute) or may last more than 8 weeks (chronic).  CAUSES Acute cough:  Viral or bacterial infections. Chronic cough:  Infections.  Allergies.  Asthma.  Post-nasal drip.  Smoking.  Heartburn or acid reflux.  Some medicines.  Chronic lung problems (COPD).  Cancer. SYMPTOMS   Cough.  Fever.  Chest pain.  Increased breathing rate.  High-pitched whistling sound when breathing (wheezing).  Colored mucus that you cough up (sputum). TREATMENT   A bacterial cough may be treated with antibiotic medicine.  A viral cough must run its course and will not respond to antibiotics.  Your caregiver may recommend other treatments if you have a chronic cough. HOME CARE INSTRUCTIONS   Only take over-the-counter or prescription medicines for pain, discomfort, or fever as directed by your caregiver. Use cough suppressants only as directed by your caregiver.  Use a cold steam vaporizer or humidifier in your bedroom or home to help loosen secretions.  Sleep in a semi-upright position if your cough is worse at night.  Rest as needed.  Stop smoking if you smoke. SEEK IMMEDIATE MEDICAL CARE IF:   You have pus in your sputum.  Your cough starts to worsen.  You cannot control your cough with suppressants and are losing sleep.  You begin coughing up blood.  You have difficulty breathing.  You develop pain which is getting worse or is uncontrolled with medicine.  You have a fever. MAKE SURE YOU:   Understand these instructions.  Will watch your condition.  Will get help right away if you are not doing well or get worse. Document Released: 11/02/2010 Document Revised: 07/29/2011 Document Reviewed: 11/02/2010 [facility] Patient Information 2015 [place], [place]. This information is not intended  to replace advice given to you by your health care provider. Make sure you discuss any questions you have with your health care provider. Hemoptysis Hemoptysis, which means coughing up blood, can be a sign of a minor problem or a serious medical condition. The blood that is coughed up may come from the lungs and airways. Coughed-up blood can also come from bleeding that occurs outside the lungs and airways. Blood can drain into the windpipe during a severe nosebleed or when blood is vomited from the stomach. Because hemoptysis can be a sign of something serious, a medical evaluation is required. For some people with hemoptysis, no definite cause is ever identified. CAUSES  The most common cause of hemoptysis is bronchitis. Some other common causes include:   A ruptured blood vessel caused by coughing or an infection.   A medical condition that causes damage to the large air passageways (bronchiectasis).   A blood clot in the lungs (pulmonary embolism).   Pneumonia.   Tuberculosis.   Breathing in a small foreign object.   Cancer. For some people with hemoptysis, no definite cause is ever identified.  HOME CARE INSTRUCTIONS  Only take over-the-counter or prescription medicines as directed by your caregiver. Do not use cough suppressants unless your caregiver approves.  If your caregiver prescribes antibiotic medicines, take them as directed. Finish them even if you start to feel better.  Do not smoke. Also avoid secondhand smoke.  Follow up with your caregiver as directed. SEEK IMMEDIATE MEDICAL CARE IF:   You cough up bloody mucus for longer than  a week.  You have a blood-producing cough that is severe or getting worse.  You have a blood-producing cough thatcomes and goes over time.  You develop problems with your breathing.   You vomit blood.  You develop bloody or black-colored stools.  You have chest pain.   You develop night sweats.  You feel faint or pass  out.   You have a fever or persistent symptoms for more than 2-3 days.  You have a fever and your symptoms suddenly get worse. MAKE SURE YOU:  Understand these instructions.  Will watch your condition.  Will get help right away if you are not doing well or get worse. Document Released: 07/15/2001 Document Revised: 04/22/2012 Document Reviewed: 02/21/2012 [facility] Patient Information 2015 [place], [place]. This information is not intended to replace advice given to you by your health care provider. Make sure you discuss any questions you have with your health care provider.

## 2014-04-05 ENCOUNTER — Other Ambulatory Visit (HOSPITAL_COMMUNITY): Payer: Self-pay

## 2014-04-11 ENCOUNTER — Ambulatory Visit (INDEPENDENT_AMBULATORY_CARE_PROVIDER_SITE_OTHER): Payer: MEDICAID | Admitting: Pharmacist

## 2014-04-11 DIAGNOSIS — I213 ST elevation (STEMI) myocardial infarction of unspecified site: Secondary | ICD-10-CM

## 2014-04-11 DIAGNOSIS — I513 Intracardiac thrombosis, not elsewhere classified: Secondary | ICD-10-CM

## 2014-04-11 LAB — POCT INR: INR: 1.7

## 2014-04-12 ENCOUNTER — Ambulatory Visit (HOSPITAL_COMMUNITY)
Admission: RE | Admit: 2014-04-12 | Discharge: 2014-04-12 | Disposition: A | Discharge status: Home / Self Care | Payer: Medicaid Other | Source: Ambulatory Visit | Attending: Cardiology | Admitting: Cardiology

## 2014-04-12 DIAGNOSIS — I5022 Chronic systolic (congestive) heart failure: Secondary | ICD-10-CM | POA: Diagnosis present

## 2014-04-12 LAB — BASIC METABOLIC PANEL
Anion gap: 13 (ref 5–15)
BUN: 24 mg/dL — AB (ref 6–23)
CHLORIDE: 94 meq/L — AB (ref 96–112)
CO2: 28 meq/L (ref 19–32)
Calcium: 9.9 mg/dL (ref 8.4–10.5)
Creatinine, Ser: 1.01 mg/dL (ref 0.50–1.35)
GFR calc Af Amer: >90 mL/min (ref >90)
GFR, EST NON AFRICAN AMERICAN: 80 mL/min — AB (ref >90)
Glucose, Bld: 91 mg/dL (ref 70–99)
POTASSIUM: 4.5 meq/L (ref 3.7–5.3)
Sodium: 135 mEq/L — ABNORMAL LOW (ref 137–147)

## 2014-04-18 ENCOUNTER — Telehealth (HOSPITAL_COMMUNITY): Payer: Self-pay | Admitting: Vascular Surgery

## 2014-04-18 NOTE — Telephone Encounter (Signed)
pt tele monitoring.Marland Kitchen. Pt flags everyday due to his low bp.. [facility] would like tochange his limits so he is not flagged everyday.. Please advise

## 2014-04-19 NOTE — Telephone Encounter (Signed)
Error

## 2014-04-20 ENCOUNTER — Ambulatory Visit (INDEPENDENT_AMBULATORY_CARE_PROVIDER_SITE_OTHER): Payer: MEDICAID | Admitting: Interventional Cardiology

## 2014-04-20 DIAGNOSIS — I213 ST elevation (STEMI) myocardial infarction of unspecified site: Secondary | ICD-10-CM

## 2014-04-20 DIAGNOSIS — I513 Intracardiac thrombosis, not elsewhere classified: Secondary | ICD-10-CM

## 2014-04-20 LAB — POCT INR: INR: 1.4

## 2014-04-20 NOTE — Telephone Encounter (Signed)
Have attempted to call Bethann Berkshirerisha on 11/30 multiple times and again today and have been unable to reach her, pt's BP usually runs 90s/50s

## 2014-04-25 ENCOUNTER — Ambulatory Visit (HOSPITAL_COMMUNITY)
Admission: RE | Admit: 2014-04-25 | Discharge: 2014-04-25 | Disposition: A | Discharge status: Home / Self Care | Payer: Medicaid Other | Source: Ambulatory Visit | Attending: Internal Medicine | Admitting: Internal Medicine

## 2014-04-25 ENCOUNTER — Encounter: Payer: Self-pay | Admitting: Licensed Clinical Social Worker

## 2014-04-25 ENCOUNTER — Encounter (HOSPITAL_COMMUNITY): Payer: Self-pay

## 2014-04-25 VITALS — BP 102/54 | HR 76 | Resp 18 | Wt 182.0 lb

## 2014-04-25 DIAGNOSIS — I240 Acute coronary thrombosis not resulting in myocardial infarction: Secondary | ICD-10-CM | POA: Diagnosis not present

## 2014-04-25 DIAGNOSIS — F17210 Nicotine dependence, cigarettes, uncomplicated: Secondary | ICD-10-CM | POA: Diagnosis not present

## 2014-04-25 DIAGNOSIS — I255 Ischemic cardiomyopathy: Secondary | ICD-10-CM | POA: Diagnosis not present

## 2014-04-25 DIAGNOSIS — I4729 Other ventricular tachycardia: Secondary | ICD-10-CM

## 2014-04-25 DIAGNOSIS — I472 Ventricular tachycardia: Secondary | ICD-10-CM | POA: Diagnosis not present

## 2014-04-25 DIAGNOSIS — Z951 Presence of aortocoronary bypass graft: Secondary | ICD-10-CM | POA: Insufficient documentation

## 2014-04-25 DIAGNOSIS — E785 Hyperlipidemia, unspecified: Secondary | ICD-10-CM | POA: Diagnosis not present

## 2014-04-25 DIAGNOSIS — I2510 Atherosclerotic heart disease of native coronary artery without angina pectoris: Secondary | ICD-10-CM | POA: Insufficient documentation

## 2014-04-25 DIAGNOSIS — Z7901 Long term (current) use of anticoagulants: Secondary | ICD-10-CM | POA: Diagnosis not present

## 2014-04-25 DIAGNOSIS — I5022 Chronic systolic (congestive) heart failure: Secondary | ICD-10-CM | POA: Insufficient documentation

## 2014-04-25 DIAGNOSIS — Z79899 Other long term (current) drug therapy: Secondary | ICD-10-CM | POA: Diagnosis not present

## 2014-04-25 DIAGNOSIS — I213 ST elevation (STEMI) myocardial infarction of unspecified site: Secondary | ICD-10-CM

## 2014-04-25 DIAGNOSIS — I513 Intracardiac thrombosis, not elsewhere classified: Secondary | ICD-10-CM

## 2014-04-25 MED ORDER — LISINOPRIL 2.5 MG PO TABS: ORAL_TABLET | ORAL | Status: DC

## 2014-04-25 MED ORDER — LISINOPRIL 5 MG PO TABS: 2.5000 mg | ORAL_TABLET | Freq: Two times a day (BID) | ORAL | Status: DC

## 2014-04-25 NOTE — Patient Instructions (Addendum)
Increase Lisinopril to 1 (2.5 mg) in am and 2 tablets (5mg ) in pm.  Follow up in 2 weeks.  Do the following things EVERYDAY: 1) Weigh yourself in the morning before breakfast. Write it down and keep it in a log. 2) Take your medicines as prescribed 3) Eat low salt foods-Limit salt (sodium) to 2000 mg per day.  4) Stay as active as you can everyday 5) Limit all fluids for the day to less than 2 liters

## 2014-04-25 NOTE — Progress Notes (Addendum)
Patient ID: Joseph Carson, male   DOB: 01-23-1956, 58 y.o.   MRN: [id number] PCP: Dr. Orpah Carson  58 yo with history of CAD s/p CABG and ischemic cardiomyopathy presents for hospital followup.  In 11[place]14, [place]  Echo at that time showed EF "19%."  He then left [place] and moved to [place] to live with his daughter and did not have any further medical contact until earlier this month.  He was not taking any medications.  He gained about 50 lbs over this period.  He became severely short of breath gradually along with significant orthopnea.  No chest pain.  He finally came to the ER and was admitted with acute on chronic systolic CHF and massive volume overload.  EF was 20% by echo with small LV thrombus seen on TEE.  RV was mildly dilated with moderately decreased systolic function.  Diuresed with IV Lasix and started back on cardiac meds.  He lost almost 50 lbs in the hospital.    He returns for follow up. Last visit lisinopril was increased to 2.5 mg in am and 5 mg in pm however he was confused about the change and continued 2.5 mg twice a day. Denies SOB/PND/Orthopnea. No chest pain. Able to walk up inclines. Weight at home 179-180 pounds.Following low salt diet.  Smoking 1 cigarette per day. He continues to wear life vest. No alarms. Medicaid pending. Taking all mediciatons. [facility] following.   Labs (10/15): K 5.1, creatinine 1.03, BNP 962 Labs 04/12/14: K 4.5 Creatinine 1.01   PMH: 1. CAD: [place] x 3 [place]14 [place], LIMA-LAD/SVG-ramus/SVG-PDA.   2. Ischemic cardiomyopathy: 11/14 echo [facility]([place]) with EF 19%.  Echo (10/15) with EF 20%, severe diffuse hypokinesis with akinesis of apex, mild MR, mildly dilated RV with moderately decreased systolic function.  TEE (10/15) with EF 15-20%, septal and anterior akinesis, basal anterior/basal anteroseptal small thrombus attached to an akinetic segment, mild MR.  3. LV thrombus: Diagnosed by TEE 10/15.  4. NSVT  SH: Smoker (down to 1 cigarette/day), [place].    FH: CAD  ROS: All systems reviewed and negative except as per HPI.   Current Outpatient Prescriptions  Medication Sig Dispense Refill  . atorvastatin (LIPITOR) 40 MG tablet Take 1 tablet (40 mg total) by mouth daily at 6 PM. 30 tablet 6  . benzonatate (TESSALON) 100 MG capsule Take 1 capsule (100 mg total) by mouth 3 (three) times daily as needed for cough. 30 capsule 0  . carvedilol (COREG) 6.25 MG tablet Take 1 tablet (6.25 mg total) by mouth 2 (two) times daily with a meal. 60 tablet 6  . furosemide (LASIX) 40 MG tablet Take 1 tablet (40 mg total) by mouth daily. 30 tablet 0  . lisinopril (PRINIVIL,ZESTRIL) 5 MG tablet Take 0.5 tablets (2.5 mg total) by mouth 2 (two) times daily. 45 tablet 3  . Multiple Vitamins-Minerals (HAIR/SKIN/NAILS PO) Take 1 tablet by mouth daily.    Marland Kitchen. spironolactone (ALDACTONE) 25 MG tablet Take 1 tablet (25 mg total) by mouth daily. 30 tablet 0  . warfarin (COUMADIN) 5 MG tablet Take 2 tablets (10 mg) at 4 pm on 03/06/14, then take 1 tablet daily at 4 pm (starting 03/07/14) or as adirected     No current facility-administered medications for this encounter.    BP 102/54 mmHg  Pulse 76  Resp 18  Wt 182 lb (82.555 kg)  SpO2 95% General: NAD Neck: JVP  5-6 cm, no thyromegaly or thyroid nodule.  Lungs: Clear to auscultation bilaterally with normal respiratory effort. CV: Nondisplaced PMI.  Heart regular S1/S2, no S3/S4, no murmur.  No peripheral edema.  No carotid bruit.  Normal pedal pulses.  Abdomen: Soft, nontender, no hepatosplenomegaly, no distention.  Skin: Intact without lesions or rashes.  Neurologic: Alert and oriented x 3.  Psych: Normal affect. Extremities: No clubbing or cyanosis.  HEENT: Normal.   Assessment/Plan: 1. CAD: No chest pain.  S/p CABG 11/14.   - Continue statin.  On BB. No aspirin as he is on coumadin. .  2. Hyperlipidemia: He is back on statin.  Goal LDL < 70.  Will check  lipids/LFTs in 1/16.  3. Chronic systolic CHF: ICM. ECHO 02/2014  EF 20% with moderately decreased RV systolic function.   NYHA class II, volume status stable. Continue Lasix 40 mg daily and 25 mg spironolactone. -Continue coreg 6.25 mg twice a day.  -Continue lisinopril 2.5 mg in am and increase pm to 5 mg.  Lisinopril bottle was relabeled to reflect the change.    - He will need repeat echo in 1/16 on medical therapy (had not been on any meds prior to last admission).  If EF < 35%, will need ICD.  Given IVCD with QRS 135 msec, would consider CRT.  4. NSVT: Noted in hospital.  On Coreg. Continue LifeVest. Repeat ECHO in January. IF EF remains low will need to refer to EP .  5. LV thrombus: Small thrombus noted on the akinetic basal anterior/anteroseptal wall.  Initially thought to be ruptured papillary muscle but by TEE looked like thrombus.  On coumadin . INR per [facility].   6. Smoking: Current. Declines smoking cessation options.    Follow up 2 weeks with BMET.   [name],[name]  04/25/2014  Patient seen and examined with Tonye BecketAmy [name], NP. We discussed all aspects of the encounter. I agree with the assessment and plan as stated above. Making slow progress. Volume status looks good. No ischemic symptoms. On coumadin for small LV thrombus. Agree with titrating lisinopril slowly as BP tolerates.  Still smoking but working on trying to cut down. Will need echo early next year.   Joseph Bensimhon,MD 3:10 PM

## 2014-04-25 NOTE — Progress Notes (Signed)
CSW referred to assist patient with insurance and disability. CSW met with patient in the clinic. Patient reports he has no insurance and currently unable to work due to health. Patient spoke about pending SSD application as well as a pending medicaid application. He states that he was hospitalized in early Fall and made applications for both during that time. He has had requests from both applications for further information and reports he has provided needed info. CSW discussed orange card application to provide immediate assistance and patient appears to already have orange card and primary care needs thru [facility]. Patient verbalizes understanding of follow up and will return call to CSW if further assistance needed with follow up. CSW will continue to be available as needed. Raquel Sarna, [place]

## 2014-04-27 ENCOUNTER — Ambulatory Visit (INDEPENDENT_AMBULATORY_CARE_PROVIDER_SITE_OTHER): Payer: MEDICAID | Admitting: *Deleted

## 2014-04-27 DIAGNOSIS — I213 ST elevation (STEMI) myocardial infarction of unspecified site: Secondary | ICD-10-CM

## 2014-04-27 DIAGNOSIS — I513 Intracardiac thrombosis, not elsewhere classified: Secondary | ICD-10-CM

## 2014-04-27 LAB — POCT INR: INR: 2.2

## 2014-05-04 ENCOUNTER — Other Ambulatory Visit (HOSPITAL_COMMUNITY): Payer: Self-pay

## 2014-05-04 ENCOUNTER — Telehealth (HOSPITAL_COMMUNITY): Payer: Self-pay | Admitting: Vascular Surgery

## 2014-05-04 NOTE — Telephone Encounter (Signed)
[facility]urse from [facility] called ... Pt is finished with his 60 days... Nurse want to know if pt needs to recert or be d/c? PLEASE ADVISE

## 2014-05-05 NOTE — Telephone Encounter (Signed)
[facility] Raynelle FanningJulie RN that she [facility].  We will start him with our paramedicine program through our HF clinic.  Susie paramedic aware and will reach out to patient for initial appointment in his home or possibly join us for his next clinic appointment on 12/22.  Ave FilterBradley, Megan Genevea

## 2014-05-10 ENCOUNTER — Encounter (HOSPITAL_COMMUNITY): Payer: Self-pay

## 2014-05-10 ENCOUNTER — Ambulatory Visit (HOSPITAL_COMMUNITY)
Admission: RE | Admit: 2014-05-10 | Discharge: 2014-05-10 | Disposition: A | Discharge status: Home / Self Care | Payer: Medicaid Other | Source: Ambulatory Visit | Attending: Internal Medicine | Admitting: Internal Medicine

## 2014-05-10 VITALS — BP 107/50 | HR 67 | Resp 18 | Wt 185.2 lb

## 2014-05-10 DIAGNOSIS — I255 Ischemic cardiomyopathy: Secondary | ICD-10-CM | POA: Insufficient documentation

## 2014-05-10 DIAGNOSIS — Z951 Presence of aortocoronary bypass graft: Secondary | ICD-10-CM | POA: Insufficient documentation

## 2014-05-10 DIAGNOSIS — I240 Acute coronary thrombosis not resulting in myocardial infarction: Secondary | ICD-10-CM | POA: Diagnosis not present

## 2014-05-10 DIAGNOSIS — Z7901 Long term (current) use of anticoagulants: Secondary | ICD-10-CM | POA: Diagnosis not present

## 2014-05-10 DIAGNOSIS — Z79899 Other long term (current) drug therapy: Secondary | ICD-10-CM | POA: Diagnosis not present

## 2014-05-10 DIAGNOSIS — I513 Intracardiac thrombosis, not elsewhere classified: Secondary | ICD-10-CM

## 2014-05-10 DIAGNOSIS — I472 Ventricular tachycardia: Secondary | ICD-10-CM | POA: Insufficient documentation

## 2014-05-10 DIAGNOSIS — I447 Left bundle-branch block, unspecified: Secondary | ICD-10-CM

## 2014-05-10 DIAGNOSIS — I213 ST elevation (STEMI) myocardial infarction of unspecified site: Secondary | ICD-10-CM

## 2014-05-10 DIAGNOSIS — I5022 Chronic systolic (congestive) heart failure: Secondary | ICD-10-CM | POA: Diagnosis present

## 2014-05-10 DIAGNOSIS — E785 Hyperlipidemia, unspecified: Secondary | ICD-10-CM | POA: Diagnosis not present

## 2014-05-10 DIAGNOSIS — I2510 Atherosclerotic heart disease of native coronary artery without angina pectoris: Secondary | ICD-10-CM | POA: Diagnosis not present

## 2014-05-10 DIAGNOSIS — F17210 Nicotine dependence, cigarettes, uncomplicated: Secondary | ICD-10-CM | POA: Diagnosis not present

## 2014-05-10 DIAGNOSIS — Z720 Tobacco use: Secondary | ICD-10-CM

## 2014-05-10 LAB — BASIC METABOLIC PANEL
ANION GAP: 6 (ref 5–15)
BUN: 23 mg/dL (ref 6–23)
CHLORIDE: 100 meq/L (ref 96–112)
CO2: 28 mmol/L (ref 19–32)
Calcium: 9.2 mg/dL (ref 8.4–10.5)
Creatinine, Ser: 1.05 mg/dL (ref 0.50–1.35)
GFR calc Af Amer: 89 mL/min — ABNORMAL LOW (ref >90)
GFR calc non Af Amer: 76 mL/min — ABNORMAL LOW (ref >90)
Glucose, Bld: 75 mg/dL (ref 70–99)
Potassium: 4.1 mmol/L (ref 3.5–5.1)
SODIUM: 134 mmol/L — AB (ref 135–145)

## 2014-05-10 NOTE — Patient Instructions (Signed)
Doing great.  Have a wonderful Christmas and a Happy New Year.  Call any issues.  Follow up in 1 month with Echo  Do the following things EVERYDAY: 1) Weigh yourself in the morning before breakfast. Write it down and keep it in a log. 2) Take your medicines as prescribed 3) Eat low salt foods-Limit salt (sodium) to 2000 mg per day.  4) Stay as active as you can everyday 5) Limit all fluids for the day to less than 2 liters 6)

## 2014-05-10 NOTE — Progress Notes (Signed)
Patient ID: Joseph Carson, male   DOB: [birth year], 58 y.o.   MRN: [id number] PCP: Dr. Orpah CobbAdvani  58 yo with history of CAD s/p CABG and ischemic cardiomyopathy. In 11[place]14, [place]  Echo at that time showed EF "19%."  [place] then left [place] and moved to [place] to live with his daughter and did not have any further medical contact until late 2015.  He was not taking any medications.  He gained about 50 lbs over this period.  He became severely short of breath gradually along with significant orthopnea.  No chest pain.  He finally came to the ER and was admitted with acute on chronic systolic CHF and massive volume overload.  EF was 20% by echo with small LV thrombus seen on TEE.  RV was mildly dilated with moderately decreased systolic function.  Diuresed with IV Lasix and started back on cardiac meds.  He lost almost 50 lbs in the hospital.    Follow up for Heart Failure: Last visit increased pm lisinopril to 5 mg which he tolerated. Overall doing well. Denies SOB, orthopnea, PND or CP. Able to walk 3/4 mile with no issues and able to go up inclines. Weight at home 187-190 lbs. Following a low salt and drinking slightly more than 2L a day. Smoking 1 cigarette a day. Wearing LifeVest with no alarms. Medicaid and SS pending. Taking medications as prescribed. Dizziness with position changes.   Labs (10/15): K 5.1, creatinine 1.03, BNP 962 Labs 04/12/14: K 4.5 Creatinine 1.01   PMH: 1. CAD: CAB[place] 3 [place]14 [place], LIMA-LAD/SVG-ramus/SVG-PDA.   2. Ischemic cardiomyopathy: 11/14 echo [facility]([place]) with EF 19%.  Echo (10/15) with EF 20%, severe diffuse hypokinesis with akinesis of apex, mild MR, mildly dilated RV with moderately decreased systolic function.  TEE (10/15) with EF 15-20%, septal and anterior akinesis, basal anterior/basal anteroseptal small thrombus attached to an akinetic segment, mild MR.  3. LV thrombus: Diagnosed by TEE 10/15.  4. NSVT  SH: Smoker (down to 1 cigarette/day), [place], [place].    FH: CAD  ROS: All systems reviewed and negative except as per HPI.   Current Outpatient Prescriptions  Medication Sig Dispense Refill  . atorvastatin (LIPITOR) 40 MG tablet Take 1 tablet (40 mg total) by mouth daily at 6 PM. 30 tablet 6  . benzonatate (TESSALON) 100 MG capsule Take 1 capsule (100 mg total) by mouth 3 (three) times daily as needed for cough. 30 capsule 0  . carvedilol (COREG) 6.25 MG tablet Take 1 tablet (6.25 mg total) by mouth 2 (two) times daily with a meal. 60 tablet 6  . furosemide (LASIX) 40 MG tablet Take 1 tablet (40 mg total) by mouth daily. 30 tablet 0  . lisinopril (PRINIVIL,ZESTRIL) 2.5 MG tablet Take 1 tablet (2.5 mg) in am and 2 tablets (5mg ) in pm 90 tablet 3  . Multiple Vitamins-Minerals (HAIR/SKIN/NAILS PO) Take 1 tablet by mouth daily.    Marland Kitchen. spironolactone (ALDACTONE) 25 MG tablet Take 1 tablet (25 mg total) by mouth daily. 30 tablet 0  . warfarin (COUMADIN) 5 MG tablet Take 2 tablets (10 mg) at 4 pm on 03/06/14, then take 1 tablet daily at 4 pm (starting 03/07/14) or as adirected     No current facility-administered medications for this encounter.    Filed Vitals:   05/10/14 0828  BP: 107/50  Pulse: 67  Resp: 18  Weight: 185 lb 4 oz (84.029  kg)  SpO2: 97%   General: NAD Neck: JVP 5-6 cm, no thyromegaly or thyroid nodule.  Lungs: Clear to auscultation bilaterally with normal respiratory effort. CV: Nondisplaced PMI.  Heart regular S1/S2, no S3/S4, no murmur.  No peripheral edema.  No carotid bruit.  Normal pedal pulses. LifeVest Abdomen: Soft, nontender, no hepatosplenomegaly, no distention.  Skin: Intact without lesions or rashes.  Neurologic: Alert and oriented x 3.  Psych: Normal affect. Extremities: No clubbing or cyanosis.  HEENT: Normal.   Assessment/Plan: 1. CAD: No chest pain.  S/p CABG 11/14.   - Continue statin.  On BB. No aspirin as he is on coumadin. .  2. Hyperlipidemia: He is back on  statin.  Goal LDL < 70.  Will check lipids/LFTs in 1/16.  3. Chronic systolic CHF: ICM. ECHO 02/2014  EF 20% with moderately decreased RV systolic function.   - NYHA class II symptoms and volume status stable. Continue Lasix 40 mg daily and 25 mg spironolactone. Check BMET today.  -Continue coreg and lisinopril at current doses. Will not titrate with dizziness and soft BP.     - He will need repeat echo in 1/16 on medical therapy (had not been on any meds prior to last admission).  If EF < 35%, will need ICD.  Given IVCD with QRS 135 msec, would consider CRT. Pending Medicaid.  4. NSVT: Noted in hospital.  On Coreg. Continue LifeVest, no alarms. Repeat ECHO in January. IF EF remains low will need to refer to EP .  5. LV thrombus: Small thrombus noted on the akinetic basal anterior/anteroseptal wall.  Initially thought to be ruptured papillary muscle but by TEE looked like thrombus.  On coumadin . INR per [facility].   6. Smoking: Current. Smoking 1 cigarette a day and is working on stopping completely.    F/U 1 month with Echo Aundria RudCosgrove, Senya Hinzman B NP-C  05/10/2014

## 2014-05-11 ENCOUNTER — Ambulatory Visit (INDEPENDENT_AMBULATORY_CARE_PROVIDER_SITE_OTHER): Payer: MEDICAID | Admitting: Internal Medicine

## 2014-05-11 DIAGNOSIS — I213 ST elevation (STEMI) myocardial infarction of unspecified site: Secondary | ICD-10-CM

## 2014-05-11 DIAGNOSIS — I513 Intracardiac thrombosis, not elsewhere classified: Secondary | ICD-10-CM

## 2014-05-11 LAB — POCT INR: INR: 2.1

## 2014-05-19 NOTE — Addendum Note (Signed)
Encounter addended by: Dolores Pattyaniel R Bensimhon, MD on: 05/19/2014  3:11 PM<BR>     Documentation filed: Follow-up Section, LOS Section, Problem List, Notes Section

## 2014-05-20 HISTORY — PX: INSERT / REPLACE / REMOVE PACEMAKER: SUR710

## 2014-05-31 ENCOUNTER — Ambulatory Visit (INDEPENDENT_AMBULATORY_CARE_PROVIDER_SITE_OTHER): Payer: Self-pay | Admitting: *Deleted

## 2014-05-31 DIAGNOSIS — Z5181 Encounter for therapeutic drug level monitoring: Secondary | ICD-10-CM

## 2014-05-31 DIAGNOSIS — I213 ST elevation (STEMI) myocardial infarction of unspecified site: Secondary | ICD-10-CM

## 2014-05-31 DIAGNOSIS — I513 Intracardiac thrombosis, not elsewhere classified: Secondary | ICD-10-CM

## 2014-05-31 LAB — POCT INR: INR: 1.7

## 2014-06-15 ENCOUNTER — Ambulatory Visit: Payer: Self-pay

## 2014-06-16 ENCOUNTER — Ambulatory Visit (HOSPITAL_COMMUNITY)
Admission: RE | Admit: 2014-06-16 | Discharge: 2014-06-16 | Disposition: A | Discharge status: Home / Self Care | Payer: Medicaid Other | Source: Ambulatory Visit | Attending: Cardiology | Admitting: Cardiology

## 2014-06-16 ENCOUNTER — Encounter (HOSPITAL_COMMUNITY): Payer: Self-pay

## 2014-06-16 ENCOUNTER — Ambulatory Visit (INDEPENDENT_AMBULATORY_CARE_PROVIDER_SITE_OTHER): Payer: Self-pay | Admitting: *Deleted

## 2014-06-16 ENCOUNTER — Ambulatory Visit (HOSPITAL_BASED_OUTPATIENT_CLINIC_OR_DEPARTMENT_OTHER)
Admission: RE | Admit: 2014-06-16 | Discharge: 2014-06-16 | Disposition: A | Discharge status: Home / Self Care | Payer: Medicaid Other | Source: Ambulatory Visit | Attending: Cardiology | Admitting: Cardiology

## 2014-06-16 VITALS — BP 114/64 | HR 65 | Resp 18 | Wt 186.5 lb

## 2014-06-16 DIAGNOSIS — I513 Intracardiac thrombosis, not elsewhere classified: Secondary | ICD-10-CM

## 2014-06-16 DIAGNOSIS — I270 Primary pulmonary hypertension: Secondary | ICD-10-CM | POA: Diagnosis not present

## 2014-06-16 DIAGNOSIS — Z5181 Encounter for therapeutic drug level monitoring: Secondary | ICD-10-CM

## 2014-06-16 DIAGNOSIS — I472 Ventricular tachycardia: Secondary | ICD-10-CM | POA: Insufficient documentation

## 2014-06-16 DIAGNOSIS — Z951 Presence of aortocoronary bypass graft: Secondary | ICD-10-CM | POA: Diagnosis not present

## 2014-06-16 DIAGNOSIS — Z720 Tobacco use: Secondary | ICD-10-CM | POA: Insufficient documentation

## 2014-06-16 DIAGNOSIS — Z7901 Long term (current) use of anticoagulants: Secondary | ICD-10-CM | POA: Insufficient documentation

## 2014-06-16 DIAGNOSIS — I5043 Acute on chronic combined systolic (congestive) and diastolic (congestive) heart failure: Secondary | ICD-10-CM

## 2014-06-16 DIAGNOSIS — I440 Atrioventricular block, first degree: Secondary | ICD-10-CM | POA: Diagnosis not present

## 2014-06-16 DIAGNOSIS — I4729 Other ventricular tachycardia: Secondary | ICD-10-CM

## 2014-06-16 DIAGNOSIS — I255 Ischemic cardiomyopathy: Secondary | ICD-10-CM | POA: Insufficient documentation

## 2014-06-16 DIAGNOSIS — E785 Hyperlipidemia, unspecified: Secondary | ICD-10-CM | POA: Insufficient documentation

## 2014-06-16 DIAGNOSIS — Z79899 Other long term (current) drug therapy: Secondary | ICD-10-CM | POA: Insufficient documentation

## 2014-06-16 DIAGNOSIS — I213 ST elevation (STEMI) myocardial infarction of unspecified site: Secondary | ICD-10-CM

## 2014-06-16 DIAGNOSIS — I5022 Chronic systolic (congestive) heart failure: Secondary | ICD-10-CM | POA: Insufficient documentation

## 2014-06-16 DIAGNOSIS — I509 Heart failure, unspecified: Secondary | ICD-10-CM

## 2014-06-16 DIAGNOSIS — I2510 Atherosclerotic heart disease of native coronary artery without angina pectoris: Secondary | ICD-10-CM | POA: Insufficient documentation

## 2014-06-16 LAB — POCT INR: INR: 1.9

## 2014-06-16 MED ORDER — CARVEDILOL 6.25 MG PO TABS: 9.3750 mg | ORAL_TABLET | Freq: Two times a day (BID) | ORAL | Status: DC

## 2014-06-16 MED ORDER — LISINOPRIL 5 MG PO TABS: 5.0000 mg | ORAL_TABLET | Freq: Two times a day (BID) | ORAL | Status: DC

## 2014-06-16 NOTE — Progress Notes (Signed)
  Echocardiogram 2D Echocardiogram has been performed.  Joseph Carson, Joseph Carson 06/16/2014, 1:30 PM

## 2014-06-16 NOTE — Progress Notes (Signed)
Patient ID: Joseph Carson, male   DOB: Mar 13, 1956, 59 y.o.   MRN: [id number] PCP: Dr. Orpah CobbAdvani  59 yo with history of CAD s/p CABG and ischemic cardiomyopathy. In 11[place]14, [place].  Echo at that time showed EF "19%."  He then left [place] and moved to [place] to live with his daughter and did not have any further medical contact until late 2015.  He was not taking any medications.  He gained about 50 lbs over this period.  He became severely short of breath gradually along with significant orthopnea.  No chest pain.  He finally came to the ER and was admitted with acute on chronic systolic CHF and massive volume overload.  EF was 20% by echo with small LV thrombus seen on TEE.  RV was mildly dilated with moderately decreased systolic function.  Diuresed with IV Lasix and started back on cardiac meds.  He lost almost 50 lbs in the hospital.    Patient has been doing well.  He can walk a mile without dyspnea.  He can climb a flight of steps with only mild dyspnea. No chest pain.  No orthopnea/PND.  Smoking 2-3 cigarettes a day. Wearing LifeVest with no alarms. Medicaid and disability pending. Taking medications as prescribed. Occasional dizziness with position changes.   Echo was done today, EF remains low at 20% with severe LV dilation.    ECG: NSR, IVCD 150 msec, 1st degree AV block  Labs (10/15): K 5.1, creatinine 1.03, BNP 962 Labs (04/12/14): K 4.5 Creatinine 1.01  Labs (12/15): K 4.1, creatinine 1.05  PMH: 1. CAD: CAB[place] x 3 [place]14 [place], LIMA-LAD/SVG-ramus/SVG-PDA.   2. Ischemic cardiomyopathy: 11/14 echo [facility]([place]) with EF 19%.  Echo (10/15) with EF 20%, severe diffuse hypokinesis with akinesis of apex, mild MR, mildly dilated RV with moderately decreased systolic function.  TEE (10/15) with EF 15-20%, septal and anterior akinesis, basal anterior/basal anteroseptal small thrombus attached to an akinetic segment, mild MR.  Echo (1/16) with EF 20%, severe LV dilation, normal RV size with moderately  decreased RV systolic function, no LV thrombus noted. 3. LV thrombus: Diagnosed by TEE 10/15. Not seen on 1/16 echo.  4. NSVT  SH: Smoker (down to 2-3 cigarette/day), [place], [place].    FH: CAD  ROS: All systems reviewed and negative except as per HPI.   Current Outpatient Prescriptions  Medication Sig Dispense Refill  . atorvastatin (LIPITOR) 40 MG tablet Take 1 tablet (40 mg total) by mouth daily at 6 PM. 30 tablet 6  . carvedilol (COREG) 6.25 MG tablet Take 1.5 tablets (9.375 mg total) by mouth 2 (two) times daily with a meal. 75 tablet 6  . furosemide (LASIX) 40 MG tablet Take 1 tablet (40 mg total) by mouth daily. 30 tablet 0  . lisinopril (PRINIVIL,ZESTRIL) 5 MG tablet Take 1 tablet (5 mg total) by mouth 2 (two) times daily. 60 tablet 3  . Multiple Vitamins-Minerals (HAIR/SKIN/NAILS PO) Take 1 tablet by mouth daily.    Marland Kitchen. spironolactone (ALDACTONE) 25 MG tablet Take 1 tablet (25 mg total) by mouth daily. 30 tablet 0  . warfarin (COUMADIN) 5 MG tablet Take 2 tablets (10 mg) at 4 pm on 03/06/14, then take 1 tablet daily at 4 pm (starting 03/07/14) or as adirected     No current facility-administered medications for this encounter.    Filed Vitals:   06/16/14 0922  BP: 114/64  Pulse: 65  Resp:  18  Weight: 186 lb 8 oz (84.596 kg)  SpO2: 95%   General: NAD Neck: JVP 5-6 cm, no thyromegaly or thyroid nodule.  Lungs: Clear to auscultation bilaterally with normal respiratory effort. CV: Nondisplaced PMI.  Heart regular S1/S2, no S3/S4, no murmur.  No peripheral edema.  No carotid bruit.  Normal pedal pulses. LifeVest Abdomen: Soft, nontender, no hepatosplenomegaly, no distention.  Skin: Intact without lesions or rashes.  Neurologic: Alert and oriented x 3.  Psych: Normal affect. Extremities: No clubbing or cyanosis.  HEENT: Normal.   Assessment/Plan: 1. CAD: No chest pain.  S/p CABG 11/14.   - Continue statin.  On BB. No  aspirin as he is on coumadin.  2. Hyperlipidemia: He is back on statin.  Goal LDL < 70.  Will check lipids/LFTs today.  3. Chronic systolic CHF: Ischemic cardiomyopathy. Echo was done today and I reviewed it.  EF remains 20% with severe LV dilation and moderate RV systolic dysfunction.  ECG shows IVCD at 150 msec.  NYHA class II symptoms and volume status stable.  - Continue Lasix 40 mg daily and 25 mg spironolactone. Check BMET today.  - Increase lisinopril to 5 mg bid.  - Increase Coreg to 9.375 mg bid.      - I will refer him to EP for ICD, would consider CRT given IVCD.  Prefer [employer] for heart failure diagnostics.  He should be getting Medicaid set up soon.  4. NSVT: Noted in hospital.  On Coreg. Continue LifeVest until ICD, no alarms.  5. LV thrombus: Small thrombus noted on the akinetic basal anterior/anteroseptal wall.  Initially thought to be ruptured papillary muscle but by TEE looked like thrombus.  He is now on coumadin.  Given persistent severely dilated LV with EF 20%, will continue coumadin.   6. Smoking: Current. Smoking 2-3 cigarettes a day and is working on stopping completely.   Followup in 6 wks  Marca Ancona 06/16/2014

## 2014-06-16 NOTE — Patient Instructions (Signed)
INCREASE Lisinopril to 5mg  twice daily.  INCREASE Carvedilol (Coreg) to 9.375 (1.5 tablets) twice daily.  We will refer you to an electrophysiologist for evaluation of possible ICD (Internal Cardiac Defibrillator) placement.  Follow up 1 week for lab work.  Follow up 6 weeks for routine appointment.  Do the following things EVERYDAY: 1) Weigh yourself in the morning before breakfast. Write it down and keep it in a log. 2) Take your medicines as prescribed 3) Eat low salt foods-Limit salt (sodium) to 2000 mg per day.  4) Stay as active as you can everyday 5) Limit all fluids for the day to less than 2 liters

## 2014-06-17 NOTE — Addendum Note (Signed)
Encounter addended by: Ihor GullyAskia M Alaysia Lightle, NT on: 06/17/2014  9:18 AM<BR>     Documentation filed: Charges VN

## 2014-06-22 ENCOUNTER — Other Ambulatory Visit (HOSPITAL_COMMUNITY): Payer: Self-pay

## 2014-06-23 ENCOUNTER — Ambulatory Visit (HOSPITAL_COMMUNITY)
Admission: RE | Admit: 2014-06-23 | Discharge: 2014-06-23 | Disposition: A | Discharge status: Home / Self Care | Payer: Medicaid Other | Source: Ambulatory Visit | Attending: Cardiology | Admitting: Cardiology

## 2014-06-23 DIAGNOSIS — I5022 Chronic systolic (congestive) heart failure: Secondary | ICD-10-CM | POA: Diagnosis present

## 2014-06-23 LAB — BASIC METABOLIC PANEL
ANION GAP: 10 (ref 5–15)
BUN: 24 mg/dL — ABNORMAL HIGH (ref 6–23)
CO2: 27 mmol/L (ref 19–32)
CREATININE: 1.11 mg/dL (ref 0.50–1.35)
Calcium: 9.8 mg/dL (ref 8.4–10.5)
Chloride: 100 mmol/L (ref 96–112)
GFR calc Af Amer: 83 mL/min — ABNORMAL LOW (ref >90)
GFR, EST NON AFRICAN AMERICAN: 71 mL/min — AB (ref >90)
Glucose, Bld: 91 mg/dL (ref 70–99)
Potassium: 4.5 mmol/L (ref 3.5–5.1)
SODIUM: 137 mmol/L (ref 135–145)

## 2014-06-30 ENCOUNTER — Ambulatory Visit (INDEPENDENT_AMBULATORY_CARE_PROVIDER_SITE_OTHER): Payer: Medicaid Other | Admitting: *Deleted

## 2014-06-30 DIAGNOSIS — I513 Intracardiac thrombosis, not elsewhere classified: Secondary | ICD-10-CM

## 2014-06-30 DIAGNOSIS — Z5181 Encounter for therapeutic drug level monitoring: Secondary | ICD-10-CM

## 2014-06-30 DIAGNOSIS — I213 ST elevation (STEMI) myocardial infarction of unspecified site: Secondary | ICD-10-CM

## 2014-06-30 LAB — POCT INR: INR: 1.7

## 2014-07-05 ENCOUNTER — Ambulatory Visit (INDEPENDENT_AMBULATORY_CARE_PROVIDER_SITE_OTHER): Payer: Medicaid Other | Admitting: Internal Medicine

## 2014-07-05 ENCOUNTER — Encounter: Payer: Self-pay | Admitting: Internal Medicine

## 2014-07-05 VITALS — BP 108/64 | HR 62 | Ht 70.0 in | Wt 190.0 lb

## 2014-07-05 DIAGNOSIS — I5022 Chronic systolic (congestive) heart failure: Secondary | ICD-10-CM

## 2014-07-05 DIAGNOSIS — I255 Ischemic cardiomyopathy: Secondary | ICD-10-CM

## 2014-07-05 DIAGNOSIS — I340 Nonrheumatic mitral (valve) insufficiency: Secondary | ICD-10-CM

## 2014-07-05 NOTE — Patient Instructions (Addendum)
   Your physician has recommended that you have a defibrillator inserted. An implantable cardioverter defibrillator (ICD) is a small device that is placed in your chest or, in rare cases, your abdomen. This device uses electrical pulses or shocks to help control life-threatening, irregular heartbeats that could lead the heart to suddenly stop beating (sudden cardiac arrest). Leads are attached to the ICD that goes into your heart. This is done in the hospital and usually requires an overnight stay. Please see the instruction sheet given to you today for more information.  Please call when you get your Medicaid and I will schedule your procedure(Bi-V ICD MDT)

## 2014-07-06 NOTE — Progress Notes (Signed)
HPI [name] is referred today by Joseph Carson for consideration for ICD implantation. He is a very pleasant 59 year old man with chronic systolic heart failure despite maximal medical therapy. He recently moved from [place] to live closer to his family. He has class III heart failure symptoms. He has left bundle branch block and first degree AV block with a QRS duration of 150 ms, and a PR interval of 230 ms. He has been treated with medical therapy with a diuretic, and ACE inhibitor, and a beta blocker. His blood pressures have been stable. He denies noncompliance. He has never had syncope. His ejection fraction is 15% by echo. His heart failure history dates back to 2014 when he underwent bypass surgery. Since then, his ejection fraction has been between 15 and 20% by echo. He was hospitalized several months ago with acute on chronic systolic heart failure and was diuresed nearly 50 pounds. No Known Allergies   Current Outpatient Prescriptions  Medication Sig Dispense Refill  . atorvastatin (LIPITOR) 40 MG tablet Take 1 tablet (40 mg total) by mouth daily at 6 PM. 30 tablet 6  . carvedilol (COREG) 6.25 MG tablet Take 1.5 tablets (9.375 mg total) by mouth 2 (two) times daily with a meal. 75 tablet 6  . furosemide (LASIX) 40 MG tablet Take 1 tablet (40 mg total) by mouth daily. 30 tablet 0  . lisinopril (PRINIVIL,ZESTRIL) 5 MG tablet Take 1 tablet (5 mg total) by mouth 2 (two) times daily. 60 tablet 3  . Multiple Vitamins-Minerals (HAIR/SKIN/NAILS PO) Take 1 tablet by mouth daily.    Marland Kitchen. spironolactone (ALDACTONE) 25 MG tablet Take 1 tablet (25 mg total) by mouth daily. 30 tablet 0  . warfarin (COUMADIN) 5 MG tablet Take as directed by the coumadin clinic     No current facility-administered medications for this visit.     Past Medical History  Diagnosis Date  . Chronic combined systolic and diastolic heart failure November 2014  . Obesity   . Pulmonary hypertension   .  Hyperbilirubinemia   . Ischemic cardiomyopathy November 2014    EF 20% by echo 02/2014  . Umbilical hernia   . Ischemic cardiomyopathy     a. echo (10/15):  EF 20%, diff HK, apical AK, mobile density inf wall near papillary muscle (?ruptured) with mod MR, mod reduced RVSF, mild PI, PASP 37 mmHg  . CAD (coronary artery disease)     a. s/p CABG x 3 03/2013 (L-LAD, S-RI, S-PDA) in [place]  . LV (left ventricular) mural thrombus 03/06/2014    a. TEE (10/15):  EF 15-20%, septum and ant walls AK, lat wall HK, no papillary muscle rupture, probable small thrombus on ant/ant-septal wall, mild MR, mod decreased RVSF, neg bubble study  . NSVT (nonsustained ventricular tachycardia) 03/06/2014    a. Life Vest placed 03/06/14  . Mitral regurgitation 03/06/2014    mild by TEE 02/2014  . LBBB (left bundle branch block) 03/06/2014  . HLD (hyperlipidemia) 03/06/2014    ROS:   All systems reviewed and negative except as noted in the HPI.   Past Surgical History  Procedure Laterality Date  . Patella fracture surgery Left 1972  . Cardiac catheterization  03/2013  . Coronary artery bypass graft  03/2013    LIMA to the LAD, SVG to the ramus, SVG to the PDA  . Tee without cardioversion N/A 03/04/2014    Procedure: TRANSESOPHAGEAL ECHOCARDIOGRAM (TEE);  Surgeon: Joseph Moralealton S McLean, MD;  Location[facility] END[facility]COPY;  Service: Cardiovascular;  Laterality: N/A;     Family History  Problem Relation Age of Onset  . Diabetes Mother     Deceased  . Thyroid disease Mother      History   Social History  . Marital Status: Single    Spouse Name: N/A  . Number of Children: N/A  . Years of Education: N/A   Occupational History  . Not on file.   Social History Main Topics  . Smoking status: Current Some Day Smoker -- 0.10 packs/day for 43 years    Types: Cigarettes  . Smokeless tobacco: Never Used     Comment: admits to smoking 1cigarette/day  . Alcohol Use: No  . Drug Use: Yes    Special: Marijuana      Comment: 03/01/2014 "used last ~ 2 months ago"  . Sexual Activity: Not Currently   Other Topics Concern  . Not on file   Social History Narrative     BP 108/64 mmHg  Pulse 62  Ht  (1.778 m)  Wt 190 lb (86.183 kg)  BMI 27.26 kg/m2  Physical Exam:  Well appearing middle-aged man, NAD HEENT: Unremarkable Neck:  7 cm JVD, no thyromegally Back:  No CVA tenderness Lungs:  Clear, with no wheezes, rales, or rhonchi. HEART:  Regular rate rhythm, no murmurs, no rubs, no clicks Abd:  soft, positive bowel sounds, no organomegally, no rebound, no guarding Ext:  2 plus pulses, no edema, no cyanosis, no clubbing Skin:  No rashes no nodules Neuro:  CN II through XII intact, motor grossly intact  EKG - normal sinus rhythm with first-degree AV block and left bundle branch block  Assess/Plan:

## 2014-07-06 NOTE — Assessment & Plan Note (Signed)
The patient has no anginal symptoms. His blood pressure and heart rate are well-controlled. No change in medical therapy.

## 2014-07-06 NOTE — Assessment & Plan Note (Signed)
I suspect this is mostly related to annular dilatation secondary to advanced heart failure. Will be followed and treated medically.

## 2014-07-06 NOTE — Assessment & Plan Note (Signed)
His symptoms are class III. We have discussed the treatment options with the patient. The risk, goals, benefits, and expectations of biventricular ICD insertion have been reviewed with the patient, and he wishes to proceed.

## 2014-07-12 ENCOUNTER — Telehealth: Payer: Self-pay | Admitting: Internal Medicine

## 2014-07-12 NOTE — Telephone Encounter (Signed)
Pt was to call back to schedule implant when he gets approved for Medicaid, he has and ready to schedule, would like a Thursday, number is [phone] R

## 2014-07-14 ENCOUNTER — Ambulatory Visit (INDEPENDENT_AMBULATORY_CARE_PROVIDER_SITE_OTHER): Payer: Medicaid Other

## 2014-07-14 DIAGNOSIS — I213 ST elevation (STEMI) myocardial infarction of unspecified site: Secondary | ICD-10-CM

## 2014-07-14 DIAGNOSIS — Z5181 Encounter for therapeutic drug level monitoring: Secondary | ICD-10-CM

## 2014-07-14 DIAGNOSIS — I513 Intracardiac thrombosis, not elsewhere classified: Secondary | ICD-10-CM

## 2014-07-14 LAB — POCT INR: INR: 2.8

## 2014-07-18 NOTE — Telephone Encounter (Signed)
F/u   Pt stated he is ready to schedule his implant. Please call pt.

## 2014-07-19 NOTE — Telephone Encounter (Signed)
Spoke with patient and he is going to have his pre-op labs 4/7 and procedure 4/14

## 2014-07-20 ENCOUNTER — Encounter: Payer: Self-pay | Admitting: *Deleted

## 2014-07-20 ENCOUNTER — Other Ambulatory Visit: Payer: Self-pay | Admitting: *Deleted

## 2014-07-20 DIAGNOSIS — I5022 Chronic systolic (congestive) heart failure: Secondary | ICD-10-CM

## 2014-07-20 NOTE — Telephone Encounter (Signed)
Scheduled for 09/01/14 at 9:30  Check in at 7:30

## 2014-07-28 ENCOUNTER — Ambulatory Visit (HOSPITAL_COMMUNITY)
Admission: RE | Admit: 2014-07-28 | Discharge: 2014-07-28 | Disposition: A | Discharge status: Home / Self Care | Payer: Medicaid Other | Source: Ambulatory Visit | Attending: Internal Medicine | Admitting: Internal Medicine

## 2014-07-28 VITALS — BP 98/42 | HR 56 | Wt 191.4 lb

## 2014-07-28 DIAGNOSIS — E785 Hyperlipidemia, unspecified: Secondary | ICD-10-CM | POA: Insufficient documentation

## 2014-07-28 DIAGNOSIS — I5022 Chronic systolic (congestive) heart failure: Secondary | ICD-10-CM | POA: Insufficient documentation

## 2014-07-28 DIAGNOSIS — I4729 Other ventricular tachycardia: Secondary | ICD-10-CM

## 2014-07-28 DIAGNOSIS — I255 Ischemic cardiomyopathy: Secondary | ICD-10-CM | POA: Diagnosis not present

## 2014-07-28 DIAGNOSIS — I240 Acute coronary thrombosis not resulting in myocardial infarction: Secondary | ICD-10-CM | POA: Insufficient documentation

## 2014-07-28 DIAGNOSIS — Z79899 Other long term (current) drug therapy: Secondary | ICD-10-CM | POA: Insufficient documentation

## 2014-07-28 DIAGNOSIS — Z7901 Long term (current) use of anticoagulants: Secondary | ICD-10-CM | POA: Insufficient documentation

## 2014-07-28 DIAGNOSIS — I2510 Atherosclerotic heart disease of native coronary artery without angina pectoris: Secondary | ICD-10-CM | POA: Insufficient documentation

## 2014-07-28 DIAGNOSIS — I472 Ventricular tachycardia: Secondary | ICD-10-CM | POA: Insufficient documentation

## 2014-07-28 DIAGNOSIS — I513 Intracardiac thrombosis, not elsewhere classified: Secondary | ICD-10-CM

## 2014-07-28 DIAGNOSIS — Z951 Presence of aortocoronary bypass graft: Secondary | ICD-10-CM | POA: Insufficient documentation

## 2014-07-28 DIAGNOSIS — I447 Left bundle-branch block, unspecified: Secondary | ICD-10-CM

## 2014-07-28 DIAGNOSIS — I213 ST elevation (STEMI) myocardial infarction of unspecified site: Secondary | ICD-10-CM

## 2014-07-28 DIAGNOSIS — F17210 Nicotine dependence, cigarettes, uncomplicated: Secondary | ICD-10-CM | POA: Diagnosis not present

## 2014-07-28 DIAGNOSIS — Z720 Tobacco use: Secondary | ICD-10-CM

## 2014-07-28 NOTE — Progress Notes (Signed)
Patient ID: Joseph Carson, male   DOB: 1955-11-04, 59 y.o.   MRN: [id number] PCP: Dr. Orpah CobbAdvani  59 yo with history of CAD s/p CABG and ischemic cardiomyopathy. In 11/14, he had CABG in [place].  Echo at that time showed EF "19%."  He then left [place] and moved to [place] to live with his daughter and did not have any further medical contact until late 2015.  He was not taking any medications.  He gained about 50 lbs over this period.  He became severely short of breath gradually along with significant orthopnea.  No chest pain.  He finally came to the ER and was admitted with acute on chronic systolic CHF and massive volume overload.  EF was 20% by echo with small LV thrombus seen on TEE.  RV was mildly dilated with moderately decreased systolic function.  Diuresed with IV Lasix and started back on cardiac meds.  He lost almost 50 lbs in the hospital.    He returns for follow up. Last visit carvedilol and lisinopril was increased. Overall feels ok. Denies SOB/PND/Orthopnea. Smoking 4 cigarettes per day. Wearing LifeVest with no alarms. Medicaid approved.  Taking medications as prescribed. Occasional dizziness with position changes. No bleeding problems.  Plans for ICD April 2016.   Echo 06/16/2014.  EF remains low at 20% with severe LV dilation.      Labs (10/15): K 5.1, creatinine 1.03, BNP 962 Labs (04/12/14): K 4.5 Creatinine 1.01  Labs (12/15): K 4.1, creatinine 1.05  PMH: 1. CAD: CABG x 3 11/14 in [place], LIMA-LAD/SVG-ramus/SVG-PDA.   2. Ischemic cardiomyopathy: 11/14 echo [facility]([place]) with EF 19%.  Echo (10/15) with EF 20%, severe diffuse hypokinesis with akinesis of apex, mild MR, mildly dilated RV with moderately decreased systolic function.  TEE (10/15) with EF 15-20%, septal and anterior akinesis, basal anterior/basal anteroseptal small thrombus attached to an akinetic segment, mild MR.  Echo (1/16) with EF 20%, severe LV dilation, normal RV size with moderately decreased RV systolic function, no LV thrombus  noted. 3. LV thrombus: Diagnosed by TEE 10/15. Not seen on 1/16 echo.  4. NSVT  SH: Smoker (down to 2-3 cigarette/day), [place], moved from [place] in 2015.    FH: CAD  ROS: All systems reviewed and negative except as per HPI.   Current Outpatient Prescriptions  Medication Sig Dispense Refill  . atorvastatin (LIPITOR) 40 MG tablet Take 1 tablet (40 mg total) by mouth daily at 6 PM. 30 tablet 6  . carvedilol (COREG) 6.25 MG tablet Take 1.5 tablets (9.375 mg total) by mouth 2 (two) times daily with a meal. 75 tablet 6  . furosemide (LASIX) 40 MG tablet Take 1 tablet (40 mg total) by mouth daily. 30 tablet 0  . lisinopril (PRINIVIL,ZESTRIL) 5 MG tablet Take 1 tablet (5 mg total) by mouth 2 (two) times daily. 60 tablet 3  . Multiple Vitamins-Minerals (HAIR/SKIN/NAILS PO) Take 1 tablet by mouth daily.    Marland Kitchen. spironolactone (ALDACTONE) 25 MG tablet Take 1 tablet (25 mg total) by mouth daily. 30 tablet 0  . warfarin (COUMADIN) 5 MG tablet Take as directed by the coumadin clinic     No current facility-administered medications for this encounter.    Filed Vitals:   07/28/14 0955  BP: 98/42  Pulse: 56  Weight: 191 lb 6.4 oz (86.818 kg)  SpO2: 98%   General: NAD Neck: JVP 5-6 cm, no thyromegaly or thyroid nodule.  Lungs: Clear to auscultation bilaterally with normal respiratory effort.  CV: Nondisplaced PMI.  Heart regular S1/S2, no S3/S4, no murmur.  No peripheral edema.  No carotid bruit.  Normal pedal pulses. LifeVest on Abdomen: Soft, nontender, no hepatosplenomegaly, no distention.  Skin: Intact without lesions or rashes.  Neurologic: Alert and oriented x 3.  Psych: Normal affect. Extremities: No clubbing or cyanosis.  HEENT: Normal.   Assessment/Plan: 1. CAD: No evidence of ischemia.  S/P CABG 11/14.   - Continue statin.  On BB. No aspirin as he is on coumadin.  2. Hyperlipidemia: He is back on statin.  Goal LDL < 70.  Will check lipids/LFTs today.   3. Chronic systolic CHF: Ischemic cardiomyopathy. Echo 05/2014 as done today and I reviewed it.  EF remains 20% with severe LV dilation and moderate RV systolic dysfunction.  ECG shows IVCD at 150 msec.  NYHA class II symptoms and volume status stable. No med titration today as BP is soft.  - Continue Lasix 40 mg daily and 25 mg spironolactone. Check BMET today.  - Continue  lisinopril to 5 mg bid.  - Continue Coreg to 9.375 mg bid.      -He was evaluated by EP with plans for ICD in April.   Prefer [employer] for heart failure diagnostics.  He should be getting Medicaid set up soon.  4. NSVT: Noted in hospital.  On Coreg. Continue LifeVest until ICD, no alarms.  5. LV thrombus: Small thrombus noted on the akinetic basal anterior/anteroseptal wall.  Initially thought to be ruptured papillary muscle but by TEE looked like thrombus.  He is now on coumadin.  Given persistent severely dilated LV with EF 20%, will continue coumadin.   6. Smoking: Current. Smoking 4 cigarettes a day. Encouraged to stop.    Follow up in 6 wks  Cherree Conerly 07/28/2014

## 2014-07-28 NOTE — Patient Instructions (Signed)
Follow up 2 months.  Do the following things EVERYDAY: 1) Weigh yourself in the morning before breakfast. Write it down and keep it in a log. 2) Take your medicines as prescribed 3) Eat low salt foods-Limit salt (sodium) to 2000 mg per day.  4) Stay as active as you can everyday 5) Limit all fluids for the day to less than 2 liters  

## 2014-08-04 ENCOUNTER — Ambulatory Visit (INDEPENDENT_AMBULATORY_CARE_PROVIDER_SITE_OTHER): Payer: Medicaid Other

## 2014-08-04 DIAGNOSIS — I213 ST elevation (STEMI) myocardial infarction of unspecified site: Secondary | ICD-10-CM

## 2014-08-04 DIAGNOSIS — I513 Intracardiac thrombosis, not elsewhere classified: Secondary | ICD-10-CM

## 2014-08-04 DIAGNOSIS — Z5181 Encounter for therapeutic drug level monitoring: Secondary | ICD-10-CM

## 2014-08-04 LAB — POCT INR: INR: 1.6

## 2014-08-18 ENCOUNTER — Ambulatory Visit (INDEPENDENT_AMBULATORY_CARE_PROVIDER_SITE_OTHER): Payer: Medicaid Other | Admitting: Surgery

## 2014-08-18 DIAGNOSIS — Z5181 Encounter for therapeutic drug level monitoring: Secondary | ICD-10-CM

## 2014-08-18 DIAGNOSIS — I213 ST elevation (STEMI) myocardial infarction of unspecified site: Secondary | ICD-10-CM

## 2014-08-18 DIAGNOSIS — I513 Intracardiac thrombosis, not elsewhere classified: Secondary | ICD-10-CM

## 2014-08-18 LAB — POCT INR: INR: 2.4

## 2014-08-25 ENCOUNTER — Other Ambulatory Visit (INDEPENDENT_AMBULATORY_CARE_PROVIDER_SITE_OTHER): Payer: Medicaid Other | Admitting: *Deleted

## 2014-08-25 ENCOUNTER — Ambulatory Visit (INDEPENDENT_AMBULATORY_CARE_PROVIDER_SITE_OTHER): Payer: Medicaid Other | Admitting: *Deleted

## 2014-08-25 DIAGNOSIS — I213 ST elevation (STEMI) myocardial infarction of unspecified site: Secondary | ICD-10-CM | POA: Diagnosis not present

## 2014-08-25 DIAGNOSIS — I513 Intracardiac thrombosis, not elsewhere classified: Secondary | ICD-10-CM

## 2014-08-25 DIAGNOSIS — Z5181 Encounter for therapeutic drug level monitoring: Secondary | ICD-10-CM | POA: Diagnosis not present

## 2014-08-25 DIAGNOSIS — I5022 Chronic systolic (congestive) heart failure: Secondary | ICD-10-CM

## 2014-08-25 LAB — BASIC METABOLIC PANEL
BUN: 30 mg/dL — AB (ref 6–23)
CHLORIDE: 101 meq/L (ref 96–112)
CO2: 29 mEq/L (ref 19–32)
Calcium: 9.6 mg/dL (ref 8.4–10.5)
Creatinine, Ser: 1.27 mg/dL (ref 0.40–1.50)
GFR: 61.68 mL/min (ref >60.00)
Glucose, Bld: 90 mg/dL (ref 70–99)
Potassium: 3.9 mEq/L (ref 3.5–5.1)
Sodium: 135 mEq/L (ref 135–145)

## 2014-08-25 LAB — CBC WITH DIFFERENTIAL/PLATELET
Basophils Absolute: 0.1 10*3/uL (ref 0.0–0.1)
Basophils Relative: 0.7 % (ref 0.0–3.0)
Eosinophils Absolute: 0.2 10*3/uL (ref 0.0–0.7)
Eosinophils Relative: 3 % (ref 0.0–5.0)
HCT: 40 % (ref 39.0–52.0)
Hemoglobin: 13.9 g/dL (ref 13.0–17.0)
LYMPHS ABS: 1.3 10*3/uL (ref 0.7–4.0)
LYMPHS PCT: 16.3 % (ref 12.0–46.0)
MCHC: 34.7 g/dL (ref 30.0–36.0)
MCV: 94.4 fl (ref 78.0–100.0)
MONOS PCT: 7.7 % (ref 3.0–12.0)
Monocytes Absolute: 0.6 10*3/uL (ref 0.1–1.0)
Neutro Abs: 5.6 10*3/uL (ref 1.4–7.7)
Neutrophils Relative %: 72.3 % (ref 43.0–77.0)
PLATELETS: 201 10*3/uL (ref 150.0–400.0)
RBC: 4.24 Mil/uL (ref 4.22–5.81)
RDW: 12.9 % (ref 11.5–15.5)
WBC: 7.7 10*3/uL (ref 4.0–10.5)

## 2014-08-25 LAB — POCT INR: INR: 2.4

## 2014-08-31 DIAGNOSIS — F17210 Nicotine dependence, cigarettes, uncomplicated: Secondary | ICD-10-CM | POA: Diagnosis not present

## 2014-08-31 DIAGNOSIS — I2510 Atherosclerotic heart disease of native coronary artery without angina pectoris: Secondary | ICD-10-CM | POA: Diagnosis not present

## 2014-08-31 DIAGNOSIS — E669 Obesity, unspecified: Secondary | ICD-10-CM | POA: Diagnosis not present

## 2014-08-31 DIAGNOSIS — Z7901 Long term (current) use of anticoagulants: Secondary | ICD-10-CM | POA: Diagnosis not present

## 2014-08-31 DIAGNOSIS — I447 Left bundle-branch block, unspecified: Secondary | ICD-10-CM | POA: Diagnosis not present

## 2014-08-31 DIAGNOSIS — I440 Atrioventricular block, first degree: Secondary | ICD-10-CM | POA: Diagnosis not present

## 2014-08-31 DIAGNOSIS — I5022 Chronic systolic (congestive) heart failure: Secondary | ICD-10-CM | POA: Diagnosis not present

## 2014-08-31 DIAGNOSIS — I255 Ischemic cardiomyopathy: Secondary | ICD-10-CM | POA: Diagnosis present

## 2014-08-31 DIAGNOSIS — I272 Other secondary pulmonary hypertension: Secondary | ICD-10-CM | POA: Diagnosis not present

## 2014-08-31 DIAGNOSIS — I340 Nonrheumatic mitral (valve) insufficiency: Secondary | ICD-10-CM | POA: Diagnosis not present

## 2014-08-31 DIAGNOSIS — Z6826 Body mass index (BMI) 26.0-26.9, adult: Secondary | ICD-10-CM | POA: Diagnosis not present

## 2014-08-31 DIAGNOSIS — Z79899 Other long term (current) drug therapy: Secondary | ICD-10-CM | POA: Diagnosis not present

## 2014-08-31 DIAGNOSIS — Z951 Presence of aortocoronary bypass graft: Secondary | ICD-10-CM | POA: Diagnosis not present

## 2014-08-31 DIAGNOSIS — E785 Hyperlipidemia, unspecified: Secondary | ICD-10-CM | POA: Diagnosis not present

## 2014-08-31 MED ORDER — MUPIROCIN 2 % EX OINT
1.0000 "application " | TOPICAL_OINTMENT | Freq: Once | CUTANEOUS | Status: AC
  Administered 2014-09-01: 1 via TOPICAL
  Filled 2014-08-31: qty 22

## 2014-08-31 MED ORDER — CHLORHEXIDINE GLUCONATE 4 % EX LIQD
60.0000 mL | Freq: Once | CUTANEOUS | Status: DC
  Filled 2014-08-31: qty 60

## 2014-08-31 MED ORDER — CEFAZOLIN SODIUM-DEXTROSE 2-3 GM-% IV SOLR: 2.0000 g | INTRAVENOUS | Status: DC

## 2014-08-31 MED ORDER — SODIUM CHLORIDE 0.9 % IV SOLN: 250.0000 mL | INTRAVENOUS | Status: DC

## 2014-08-31 MED ORDER — SODIUM CHLORIDE 0.9 % IJ SOLN: 3.0000 mL | Freq: Two times a day (BID) | INTRAMUSCULAR | Status: DC

## 2014-08-31 MED ORDER — SODIUM CHLORIDE 0.9 % IV SOLN
INTRAVENOUS | Status: DC
  Administered 2014-09-01: 08:00:00 via INTRAVENOUS

## 2014-08-31 MED ORDER — SODIUM CHLORIDE 0.9 % IJ SOLN: 3.0000 mL | INTRAMUSCULAR | Status: DC | PRN

## 2014-08-31 MED ORDER — SODIUM CHLORIDE 0.9 % IR SOLN
80.0000 mg | Status: DC
  Filled 2014-08-31: qty 2

## 2014-09-01 ENCOUNTER — Encounter (HOSPITAL_COMMUNITY)
Admission: RE | Disposition: A | Discharge status: Home / Self Care | Payer: Self-pay | Source: Ambulatory Visit | Attending: Internal Medicine

## 2014-09-01 ENCOUNTER — Encounter (HOSPITAL_COMMUNITY): Payer: Self-pay | Admitting: General Practice

## 2014-09-01 ENCOUNTER — Ambulatory Visit (HOSPITAL_COMMUNITY)
Admission: RE | Admit: 2014-09-01 | Discharge: 2014-09-02 | Disposition: A | Discharge status: Home / Self Care | Payer: Medicaid Other | Source: Ambulatory Visit | Attending: Internal Medicine | Admitting: Internal Medicine

## 2014-09-01 DIAGNOSIS — I2510 Atherosclerotic heart disease of native coronary artery without angina pectoris: Secondary | ICD-10-CM | POA: Diagnosis not present

## 2014-09-01 DIAGNOSIS — F17210 Nicotine dependence, cigarettes, uncomplicated: Secondary | ICD-10-CM | POA: Insufficient documentation

## 2014-09-01 DIAGNOSIS — E785 Hyperlipidemia, unspecified: Secondary | ICD-10-CM | POA: Insufficient documentation

## 2014-09-01 DIAGNOSIS — Z79899 Other long term (current) drug therapy: Secondary | ICD-10-CM | POA: Insufficient documentation

## 2014-09-01 DIAGNOSIS — Z951 Presence of aortocoronary bypass graft: Secondary | ICD-10-CM | POA: Insufficient documentation

## 2014-09-01 DIAGNOSIS — Z6826 Body mass index (BMI) 26.0-26.9, adult: Secondary | ICD-10-CM | POA: Insufficient documentation

## 2014-09-01 DIAGNOSIS — E669 Obesity, unspecified: Secondary | ICD-10-CM | POA: Insufficient documentation

## 2014-09-01 DIAGNOSIS — I340 Nonrheumatic mitral (valve) insufficiency: Secondary | ICD-10-CM | POA: Insufficient documentation

## 2014-09-01 DIAGNOSIS — Z95810 Presence of automatic (implantable) cardiac defibrillator: Secondary | ICD-10-CM | POA: Diagnosis present

## 2014-09-01 DIAGNOSIS — I509 Heart failure, unspecified: Secondary | ICD-10-CM | POA: Diagnosis not present

## 2014-09-01 DIAGNOSIS — I272 Other secondary pulmonary hypertension: Secondary | ICD-10-CM | POA: Insufficient documentation

## 2014-09-01 DIAGNOSIS — I447 Left bundle-branch block, unspecified: Secondary | ICD-10-CM | POA: Insufficient documentation

## 2014-09-01 DIAGNOSIS — I255 Ischemic cardiomyopathy: Secondary | ICD-10-CM | POA: Insufficient documentation

## 2014-09-01 DIAGNOSIS — I5022 Chronic systolic (congestive) heart failure: Secondary | ICD-10-CM | POA: Insufficient documentation

## 2014-09-01 DIAGNOSIS — I440 Atrioventricular block, first degree: Secondary | ICD-10-CM | POA: Insufficient documentation

## 2014-09-01 DIAGNOSIS — Z7901 Long term (current) use of anticoagulants: Secondary | ICD-10-CM | POA: Insufficient documentation

## 2014-09-01 HISTORY — DX: Presence of automatic (implantable) cardiac defibrillator: Z95.810

## 2014-09-01 HISTORY — PX: BI-VENTRICULAR IMPLANTABLE CARDIOVERTER DEFIBRILLATOR: SHX5459

## 2014-09-01 LAB — PROTIME-INR
INR: 1.57 — ABNORMAL HIGH (ref 0.00–1.49)
Prothrombin Time: 18.9 seconds — ABNORMAL HIGH (ref 11.6–15.2)

## 2014-09-01 LAB — SURGICAL PCR SCREEN
MRSA, PCR: NEGATIVE
Staphylococcus aureus: NEGATIVE

## 2014-09-01 SURGERY — BI-VENTRICULAR IMPLANTABLE CARDIOVERTER DEFIBRILLATOR  (CRT-D)

## 2014-09-01 MED ORDER — WARFARIN SODIUM 5 MG PO TABS
5.0000 mg | ORAL_TABLET | ORAL | Status: DC
  Filled 2014-09-01: qty 1

## 2014-09-01 MED ORDER — ATORVASTATIN CALCIUM 40 MG PO TABS
40.0000 mg | ORAL_TABLET | Freq: Every day | ORAL | Status: DC
  Administered 2014-09-01: 40 mg via ORAL
  Filled 2014-09-01 (×2): qty 1

## 2014-09-01 MED ORDER — LIDOCAINE HCL (PF) 1 % IJ SOLN
INTRAMUSCULAR | Status: AC
  Filled 2014-09-01: qty 60

## 2014-09-01 MED ORDER — WARFARIN SODIUM 7.5 MG PO TABS
7.5000 mg | ORAL_TABLET | ORAL | Status: AC
  Administered 2014-09-01: 7.5 mg via ORAL
  Filled 2014-09-01: qty 1

## 2014-09-01 MED ORDER — MIDAZOLAM HCL 5 MG/5ML IJ SOLN
INTRAMUSCULAR | Status: AC
Start: 2014-09-01 — End: 2014-09-01
  Filled 2014-09-01: qty 5

## 2014-09-01 MED ORDER — HEPARIN (PORCINE) IN NACL 2-0.9 UNIT/ML-% IJ SOLN
INTRAMUSCULAR | Status: AC
  Filled 2014-09-01: qty 500

## 2014-09-01 MED ORDER — LISINOPRIL 5 MG PO TABS
5.0000 mg | ORAL_TABLET | Freq: Two times a day (BID) | ORAL | Status: DC
  Administered 2014-09-01 – 2014-09-02 (×2): 5 mg via ORAL
  Filled 2014-09-01 (×3): qty 1

## 2014-09-01 MED ORDER — MIDAZOLAM HCL 5 MG/5ML IJ SOLN
INTRAMUSCULAR | Status: AC
  Filled 2014-09-01: qty 5

## 2014-09-01 MED ORDER — WARFARIN - PHYSICIAN DOSING INPATIENT
Freq: Every day | Status: DC
  Administered 2014-09-01: 18:00:00

## 2014-09-01 MED ORDER — CEFAZOLIN SODIUM 1-5 GM-% IV SOLN
1.0000 g | Freq: Four times a day (QID) | INTRAVENOUS | Status: AC
  Administered 2014-09-01 – 2014-09-02 (×3): 1 g via INTRAVENOUS
  Filled 2014-09-01 (×3): qty 50

## 2014-09-01 MED ORDER — FENTANYL CITRATE 0.05 MG/ML IJ SOLN
INTRAMUSCULAR | Status: AC
  Filled 2014-09-01: qty 2

## 2014-09-01 MED ORDER — MUPIROCIN 2 % EX OINT
TOPICAL_OINTMENT | CUTANEOUS | Status: AC
  Administered 2014-09-01: 1 via TOPICAL
  Filled 2014-09-01: qty 22

## 2014-09-01 MED ORDER — CEFAZOLIN SODIUM-DEXTROSE 2-3 GM-% IV SOLR
INTRAVENOUS | Status: AC
Start: 2014-09-01 — End: 2014-09-01
  Filled 2014-09-01: qty 50

## 2014-09-01 MED ORDER — FUROSEMIDE 40 MG PO TABS
40.0000 mg | ORAL_TABLET | Freq: Every day | ORAL | Status: DC
  Administered 2014-09-01 – 2014-09-02 (×2): 40 mg via ORAL
  Filled 2014-09-01 (×2): qty 1

## 2014-09-01 MED ORDER — CARVEDILOL 6.25 MG PO TABS
9.3750 mg | ORAL_TABLET | Freq: Two times a day (BID) | ORAL | Status: DC
  Administered 2014-09-01 – 2014-09-02 (×2): 9.375 mg via ORAL
  Filled 2014-09-01 (×4): qty 1

## 2014-09-01 MED ORDER — ACETAMINOPHEN 325 MG PO TABS
325.0000 mg | ORAL_TABLET | ORAL | Status: DC | PRN
Start: 2014-09-01 — End: 2014-09-02

## 2014-09-01 MED ORDER — ONDANSETRON HCL 4 MG/2ML IJ SOLN: 4.0000 mg | Freq: Four times a day (QID) | INTRAMUSCULAR | Status: DC | PRN

## 2014-09-01 MED ORDER — SPIRONOLACTONE 25 MG PO TABS
25.0000 mg | ORAL_TABLET | Freq: Every day | ORAL | Status: DC
  Administered 2014-09-01 – 2014-09-02 (×2): 25 mg via ORAL
  Filled 2014-09-01 (×2): qty 1

## 2014-09-01 MED ORDER — WARFARIN SODIUM 5 MG PO TABS: 5.0000 mg | ORAL_TABLET | Freq: Every day | ORAL | Status: DC

## 2014-09-01 NOTE — Interval H&P Note (Signed)
History and Physical Interval Note:  09/01/2014 8:57 AM  Joseph Carson  has presented today for surgery, with the diagnosis of cm  The various methods of treatment have been discussed with the patient and family. After consideration of risks, benefits and other options for treatment, the patient has consented to  Procedure(s): BI-VENTRICULAR IMPLANTABLE CARDIOVERTER DEFIBRILLATOR  (CRT-D) (N/A) as a surgical intervention .  The patient's history has been reviewed, patient examined, no change in status, stable for surgery.  I have reviewed the patient's chart and labs.  Questions were answered to the patient's satisfaction.     Leonia ReevesGregg Cai Flott,M.D.

## 2014-09-01 NOTE — H&P (Signed)
  ICD Criteria  Current LVEF:15% ;Obtained > or = 1 month ago and < or = 3 months ago.  NYHA Functional Classification: Class III  Heart Failure History:  Yes, Duration of heart failure since onset is > 9 months  Non-Ischemic Dilated Cardiomyopathy History:  No.  Atrial Fibrillation/Atrial Flutter:  No.  Ventricular Tachycardia History:  No.  Cardiac Arrest History:  No  History of Syndromes with Risk of Sudden Death:  No.  Previous ICD:  No.  Electrophysiology Study: No.  Prior MI: Yes, Most recent MI timeframe is > 40 days.  PPM: No.  OSA:  No  Patient Life Expectancy of >=1 year: Yes.  Anticoagulation Therapy:  Patient is NOT on anticoagulation therapy.   Beta Blocker Therapy:  Yes.   Ace Inhibitor/ARB Therapy:  Yes.

## 2014-09-01 NOTE — H&P (Signed)
HPI [name] is referred today by Dr. Shirlee LatchMcLean for consideration for ICD implantation. He is a very pleasant 59 year old man with chronic systolic heart failure despite maximal medical therapy. He recently moved from [place] to live closer to his family. He has class III heart failure symptoms. He has left bundle branch block and first degree AV block with a QRS duration of 150 ms, and a PR interval of 230 ms. He has been treated with medical therapy with a diuretic, and ACE inhibitor, and a beta blocker. His blood pressures have been stable. He denies noncompliance. He has never had syncope. His ejection fraction is 15% by echo. His heart failure history dates back to 2014 when he underwent bypass surgery. Since then, his ejection fraction has been between 15 and 20% by echo. He was hospitalized several months ago with acute on chronic systolic heart failure and was diuresed nearly 50 pounds. No Known Allergies   Current Outpatient Prescriptions  Medication Sig Dispense Refill  . atorvastatin (LIPITOR) 40 MG tablet Take 1 tablet (40 mg total) by mouth daily at 6 PM. 30 tablet 6  . carvedilol (COREG) 6.25 MG tablet Take 1.5 tablets (9.375 mg total) by mouth 2 (two) times daily with a meal. 75 tablet 6  . furosemide (LASIX) 40 MG tablet Take 1 tablet (40 mg total) by mouth daily. 30 tablet 0  . lisinopril (PRINIVIL,ZESTRIL) 5 MG tablet Take 1 tablet (5 mg total) by mouth 2 (two) times daily. 60 tablet 3  . Multiple Vitamins-Minerals (HAIR/SKIN/NAILS PO) Take 1 tablet by mouth daily.    Marland Kitchen. spironolactone (ALDACTONE) 25 MG tablet Take 1 tablet (25 mg total) by mouth daily. 30 tablet 0  . warfarin (COUMADIN) 5 MG tablet Take as directed by the coumadin clinic     No current facility-administered medications for this visit.     Past Medical History  Diagnosis Date  . Chronic combined systolic and diastolic heart failure November 2014  . Obesity    . Pulmonary hypertension   . Hyperbilirubinemia   . Ischemic cardiomyopathy November 2014    EF 20% by echo 02/2014  . Umbilical hernia   . Ischemic cardiomyopathy     a. echo (10/15): EF 20%, diff HK, apical AK, mobile density inf wall near papillary muscle (?ruptured) with mod MR, mod reduced RVSF, mild PI, PASP 37 mmHg  . CAD (coronary artery disease)     a. s/p CABG x 3 03/2013 (L-LAD, S-RI, S-PDA) in [place]  . LV (left ventricular) mural thrombus 03/06/2014    a. TEE (10/15): EF 15-20%, septum and ant walls AK, lat wall HK, no papillary muscle rupture, probable small thrombus on ant/ant-septal wall, mild MR, mod decreased RVSF, neg bubble study  . NSVT (nonsustained ventricular tachycardia) 03/06/2014    a. Life Vest placed 03/06/14  . Mitral regurgitation 03/06/2014    mild by TEE 02/2014  . LBBB (left bundle branch block) 03/06/2014  . HLD (hyperlipidemia) 03/06/2014    ROS:  All systems reviewed and negative except as noted in the HPI.   Past Surgical History  Procedure Laterality Date  . Patella fracture surgery Left 1972  . Cardiac catheterization  03/2013  . Coronary artery bypass graft  03/2013    LIMA to the LAD, SVG to the ramus, SVG to the PDA  . Tee without cardioversion N/A 03/04/2014    Procedure: TRANSESOPHAGEAL ECHOCARDIOGRAM (TEE); Surgeon: Laurey Moralealton S McLean, MD; [facility]; Service: Cardiovascular; Laterality: N/A;     Family  History  Problem Relation Age of Onset  . Diabetes Mother     Deceased  . Thyroid disease Mother      History   Social History  . Marital Status: Single    Spouse Name: N/A  . Number of Children: N/A  . Years of Education: N/A   Occupational History  . Not on file.   Social History Main Topics  . Smoking status: Current Some Day Smoker -- 0.10 packs/day for 43 years     Types: Cigarettes  . Smokeless tobacco: Never Used     Comment: admits to smoking 1cigarette/day  . Alcohol Use: No  . Drug Use: Yes    Special: Marijuana     Comment: 03/01/2014 "used last ~ 2 months ago"  . Sexual Activity: Not Currently   Other Topics Concern  . Not on file   Social History Narrative     BP 108/64 mmHg  Pulse 62  Ht  (1.778 m)  Wt 190 lb (86.183 kg)  BMI 27.26 kg/m2  Physical Exam:  Well appearing middle-aged man, NAD HEENT: Unremarkable Neck: 7 cm JVD, no thyromegally Back: No CVA tenderness Lungs: Clear, with no wheezes, rales, or rhonchi. HEART: Regular rate rhythm, no murmurs, no rubs, no clicks Abd: soft, positive bowel sounds, no organomegally, no rebound, no guarding Ext: 2 plus pulses, no edema, no cyanosis, no clubbing Skin: No rashes no nodules Neuro: CN II through XII intact, motor grossly intact  EKG - normal sinus rhythm with first-degree AV block and left bundle branch block  Assess/Plan:            Chronic systolic CHF (congestive heart failure) - Joseph Maw, MD at 07/06/2014 8:29 AM     Status: Written Related Problem: Chronic systolic CHF (congestive heart failure)   Expand All Collapse All   His symptoms are class III. We have discussed the treatment options with the patient. The risk, goals, benefits, and expectations of biventricular ICD insertion have been reviewed with the patient, and he wishes to proceed.            Ischemic cardiomyopathy EF 15-20% - Joseph Maw, MD at 07/06/2014 8:29 AM     Status: Written Related Problem: Ischemic cardiomyopathy EF 15-20%   Expand All Collapse All   The patient has no anginal symptoms. His blood pressure and heart rate are well-controlled. No change in medical therapy.            Mitral regurgitation - Joseph Maw, MD at 07/06/2014 8:30 AM     Status: Written Related Problem: Mitral regurgitation    Expand All Collapse All   I suspect this is mostly related to annular dilatation secondary to advanced heart failure. Will be followed and treated medically       Leandria Thier,M.D.

## 2014-09-01 NOTE — Discharge Instructions (Addendum)
Supplemental Discharge Instructions for  Pacemaker/Defibrillator Patients  Activity No heavy lifting or vigorous activity with your left arm for 6 to 8 weeks.  Do not raise your left arm above your head for one week.  Gradually raise your affected arm as drawn below.           __       09-05-14                   09-06-14                  09-07-14               09-08-14  NO DRIVING for   1 week  ; you may begin driving on  [id number]   .  WOUND CARE - Keep the wound area clean and dry.  Do not get this area wet for one week. No showers for one week; you may shower on   09-08-14  . - The tape/steri-strips on your wound will fall off; do not pull them off.  No bandage is needed on the site.  DO  NOT apply any creams, oils, or ointments to the wound area. - If you notice any drainage or discharge from the wound, any swelling or bruising at the site, or you develop a fever > 101? F after you are discharged home, call the office at once.  Special Instructions - You are still able to use cellular telephones; use the ear opposite the side where you have your pacemaker/defibrillator.  Avoid carrying your cellular phone near your device. - When traveling through airports, show security personnel your identification card to avoid being screened in the metal detectors.  Ask the security personnel to use the hand wand. - Avoid arc welding equipment, MRI testing (magnetic resonance imaging), TENS units (transcutaneous nerve stimulators).  Call the office for questions about other devices. - Avoid electrical appliances that are in poor condition or are not properly grounded. - Microwave ovens are safe to be near or to operate.  Additional information for defibrillator patients should your device go off: - If your device goes off ONCE and you feel fine afterward, notify the device clinic nurses. - If your device goes off ONCE and you do not feel well afterward, call 911. - If your device goes off TWICE,  call 911. - If your device goes off THREE times in one day, call 911.  DO NOT DRIVE YOURSELF OR A FAMILY MEMBER WITH A DEFIBRILLATOR TO THE HOSPITAL--CALL 911.  Information on my medicine - Coumadin   (Warfarin)  This medication education was reviewed with me or my healthcare representative as part of my discharge preparation.   Why was Coumadin prescribed for you? Coumadin was prescribed for you because you have a blood clot or a medical condition that can cause an increased risk of forming blood clots. Blood clots can cause serious health problems by blocking the flow of blood to the heart, lung, or brain. Coumadin can prevent harmful blood clots from forming. As a reminder your indication for Coumadin is:   Blood Clotting Disorder, left ventricular mural thrombus  What test will check on my response to Coumadin? While on Coumadin (warfarin) you will need to have an INR test regularly to ensure that your dose is keeping you in the desired range. The INR (international normalized ratio) number is calculated from the result of the laboratory test called prothrombin time (PT).  If an  INR APPOINTMENT HAS NOT ALREADY BEEN MADE FOR YOU please schedule an appointment to have this lab work done by your health care provider within 7 days. Your INR goal is usually a number between:  2 to 3 or your provider may give you a more narrow range like 2-2.5.  Ask your health care provider during an office visit what your goal INR is.  What  do you need to  know  About  COUMADIN? Take Coumadin (warfarin) exactly as prescribed by your healthcare provider about the same time each day.  DO NOT stop taking without talking to the doctor who prescribed the medication.  Stopping without other blood clot prevention medication to take the place of Coumadin may increase your risk of developing a new clot or stroke.  Get refills before you run out.  What do you do if you miss a dose? If you miss a dose, take it as soon  as you remember on the same day then continue your regularly scheduled regimen the next day.  Do not take two doses of Coumadin at the same time.  Important Safety Information A possible side effect of Coumadin (Warfarin) is an increased risk of bleeding. You should call your healthcare provider right away if you experience any of the following: ? Bleeding from an injury or your nose that does not stop. ? Unusual colored urine (red or dark brown) or unusual colored stools (red or black). ? Unusual bruising for unknown reasons. ? A serious fall or if you hit your head (even if there is no bleeding).  Some foods or medicines interact with Coumadin (warfarin) and might alter your response to warfarin. To help avoid this: ? Eat a balanced diet, maintaining a consistent amount of Vitamin K. ? Notify your provider about major diet changes you plan to make. ? Avoid alcohol or limit your intake to 1 drink for women and 2 drinks for men per day. (1 drink is 5 oz. wine, 12 oz. beer, or 1.5 oz. liquor.)  Make sure that ANY health care provider who prescribes medication for you knows that you are taking Coumadin (warfarin).  Also make sure the healthcare provider who is monitoring your Coumadin knows when you have started a new medication including herbals and non-prescription products.  Coumadin (Warfarin)  Major Drug Interactions  Increased Warfarin Effect Decreased Warfarin Effect  Alcohol (large quantities) Antibiotics (esp. Septra/Bactrim, Flagyl, Cipro) Amiodarone (Cordarone) Aspirin (ASA) Cimetidine (Tagamet) Megestrol (Megace) NSAIDs (ibuprofen, naproxen, etc.) Piroxicam (Feldene) Propafenone (Rythmol SR) Propranolol (Inderal) Isoniazid (INH) Posaconazole (Noxafil) Barbiturates (Phenobarbital) Carbamazepine (Tegretol) Chlordiazepoxide (Librium) Cholestyramine (Questran) Griseofulvin Oral Contraceptives Rifampin Sucralfate (Carafate) Vitamin K   Coumadin (Warfarin) Major Herbal  Interactions  Increased Warfarin Effect Decreased Warfarin Effect  Garlic Ginseng Ginkgo biloba Coenzyme Q10 Green tea St. Johns wort    Coumadin (Warfarin) FOOD Interactions  Eat a consistent number of servings per week of foods HIGH in Vitamin K (1 serving =  cup)  Collards (cooked, or boiled & drained) Kale (cooked, or boiled & drained) Mustard greens (cooked, or boiled & drained) Parsley *serving size only =  cup Spinach (cooked, or boiled & drained) Swiss chard (cooked, or boiled & drained) Turnip greens (cooked, or boiled & drained)  Eat a consistent number of servings per week of foods MEDIUM-HIGH in Vitamin K (1 serving = 1 cup)  Asparagus (cooked, or boiled & drained) Broccoli (cooked, boiled & drained, or raw & chopped) Brussel sprouts (cooked, or boiled & drained) *serving size only =  cup Lettuce, raw (green leaf, endive, romaine) Spinach, raw Turnip greens, raw & chopped   These websites have more information on Coumadin (warfarin):  FailFactory.se; VeganReport.com.au;

## 2014-09-01 NOTE — Progress Notes (Signed)
Orthopedic Tech Progress Note Patient Details:  Sherene SiresMichael Spark 1955-11-17 [id number] Patient already has arm sling. Patient ID: Sherene SiresMichael Mayall, male   DOB: 1955-11-17, 59 y.o.   MRN: [id number]   Jennye MoccasinHughes, Syaire Saber Craig 09/01/2014, 4:28 PM

## 2014-09-01 NOTE — CV Procedure (Signed)
SURGEON:  Lewayne Bunting, MD      PREPROCEDURE DIAGNOSES:   1.Ischemic cardiomyopathy EF 15%.   2. New York Heart Association class III, heart failure chronically.   3. Left bundle-branch block.      POSTPROCEDURE DIAGNOSES:   1. Ischemic cardiomyopathy.   2. New York Heart Association class III heart failure chronically.   3. Left bundle-branch block.      PROCEDURES:    1. Biventricular ICD implantation.  2. Defibrillation threshold testing 3. Venography of the coronary sinus 4. Venography of the left upper extremity    INTRODUCTION:  Joseph Carson is a 59 y.o. male with an ischemic CM (EF 15%), NYHA Class III CHF, and LBBB QRS morophology. At this time, he meets MADIT II/ SCD-HeFT criteria for ICD implantation for primary prevention of sudden death.  Given LBBB, the patient may also be expected to benefit from resynchronization therapy. The patient has been treated with an optimal medical regimen but continues to have a depressed ejection fraction and NYHA Class III CHF symptoms.  he therefore  presents today for a biventricular ICD implantation.      DESCRIPTION OF PROCEDURE:  Informed written consent was obtained and the   patient was brought to the electrophysiology lab in the fasting state. The patient was adequately sedated with intravenous Versed and Fentanyl as outlined in the nursing report.  The patient's left chest was prepped and draped in the usual sterile fashion by the EP lab staff.  The skin overlying the left deltopectoral region was infiltrated with lidocaine for local analgesia.  A 6-cm incision was made over the left deltopectoral region.  A left subcutaneous defibrillator pocket was fashioned using a combination of sharp and blunt dissection.  Electrocautery was used to assure hemostasis.   Left Upper extremity Venography:  After initial difficulty in obtaining venous access, a venogram of the left upper extremity was performed which revealed a moderate sized left  axillary vein which emptied into a moderate sized left subclavian vein.  The veins were displaced caudally.  RA/RV Lead Placement: The left axillary vein was cannulated with fluoroscopic visualization.  Through the left axillary vein, a Medtronic [id number] (serial # [id number]  ) right atrial lead and a Medtronic [id number] (serial number [id number]) right ventricular defibrillator lead were advanced with fluoroscopic visualization into the right atrial appendage and right ventricular apical septal positions respectively.  Initial atrial lead P-waves measured 2.2 mV with an impedance of 725 ohms and a threshold of 0.9 volts at 0.5 milliseconds.  The right ventricular lead R-wave measured 8 mV with impedance of 476 ohms and a threshold of 0.6 volts at 0.5 milliseconds.   LV Lead Placement:  A Medtronic guide was advanced through the left axillary vein into the low lateral right atrium. A 6 french hexapolar EP catheter was introduced through the Medtronic guide and used to cannulate the coronary sinus. A coronary sinus nonselective venogram was performed by hand injection of nonionic contrast. This demonstrated a bifurcated coronary sinus with a sheperds crook lateral vein.  A 0.014 angioplasty guide wire was introduced through the Medtronic Guide and advanced into the lateral vein. A Medtronic(serial number [id number]) lead was advanced through the coronary sinus lateral vein and into the distal lateral branch. This was approximately two-thirds from the base to the apex in a very lateral  position. In this location with 4- RV coil bipolar configuration, the left ventricular lead R-waves measured 3 mV with impedance of 680 ohms and a  threshold of 1 volt at 0.5 Milliseconds with no diaphragmatic stimulation observed when pacing at 10 volts output. The medtronic guide was therefore removed.  All three leads were secured to the pectoralis fascia using #2 silk suture over the suture sleeves. The pocket then irrigated with copious  gentamicin solution.   Device Placement: The leads were then  connected to a [employer]onic Viva Quad (serial  Number [id number]) biventricular ICD.  The defibrillator was placed into the  pocket.  The pocket was then closed in 2 layers with 2.0 Vicryl suture  for the subcutaneous and subcuticular layers.  Steri-Strips and a  sterile dressing were then applied.   DFT Testing: Defibrillation Threshold testing was then performed. Ventricular fibrillation was induced with a T shock.  Adequate sensing of ventricular  fibrillation was observed with minimal dropout with a programmed sensitivity of 1.610mV.  The patient was successfully defibrillated to sinus rhythm with a single 15 joules shock delivered from the device with an impedance of 63 ohms in a duration of 4.5 seconds.  The patient remained in sinus rhythm thereafter.  There were no early apparent complications.   CONCLUSIONS:   1. Ischemic cardiomyopathy with Left bundle-branch block and chronic New York Heart Association class III heart failure.   2. Successful biventricular ICD implantation.   3. DFT less than or equal to 20 joules.   4. No early apparent complications.   Lewayne BuntingGregg Taylor, MD  11:25 AM 09/01/2014

## 2014-09-01 NOTE — Discharge Summary (Signed)
ELECTROPHYSIOLOGY PROCEDURE DISCHARGE SUMMARY    Patient ID: Joseph SiresMichael Rickenbach,  MRN: [id number], DOB/AGE: November 14, 1955 59 y.o.  Admit date: 09/01/2014 Discharge date: 09/02/2014  Primary Care Physician: Doris CheadleADVANI, DEEPAK, MD Primary Cardiologist: Shirlee LatchMcLean Electrophysiologist: Ladona Ridgelaylor  Primary Discharge Diagnosis:  Ischemic cardiomyopathy, congestive heart failure, and LBBB status post CRTD implant this admission  Secondary Discharge Diagnosis:  1.  CAD s/p CABG 2.  Obesity 3.  Pulmonary hypertension 4.  Hyperlipidemia 5.  NSVT  No Known Allergies   Procedures This Admission:  1.  Implantation of a MDT CRTD on 09-01-14 by Dr Ladona Ridgelaylor.  See op note for full details.  DFT's were successful at 20 J.  There were no immediate post procedure complications. 2.  CXR on 09-02-14 demonstrated no pneumothorax status post device implantation.   Brief HPI: Joseph SiresMichael Madonia is a 59 y.o. male was referred to electrophysiology in the outpatient setting for consideration of CRTD implantation.  Past medical history includes ischemic cardiomyopathy, congestive heart failure, and LBBB.  The patient has persistent LV dysfunction despite guideline directed therapy.  Risks, benefits, and alternatives to ICD implantation were reviewed with the patient who wished to proceed.   Hospital Course:  The patient was admitted and underwent implantation of a MDT CRTD with details as outlined above. He was monitored on telemetry overnight which demonstrated pacing and sensing appropriately .  Left chest was without hematoma or ecchymosis.  Old blood on steri-strips.  The device was interrogated and found to be functioning normally.  CXR was obtained and demonstrated no pneumothorax status post device implantation.  Wound care, arm mobility, and restrictions were reviewed with the patient.  The patient was examined and considered stable for discharge to home.   The patient's discharge medications include an ACE-I (Lisinopril)  and beta blocker (Carvedilol).   Physical Exam: Filed Vitals:   09/01/14 2100 09/02/14 0225 09/02/14 0500 09/02/14 0530  BP: 101/58 95/54  110/45  Pulse: 57 51  50  Temp: 97.6 F (36.4 C) 97.7 F (36.5 C)  97.8 F (36.6 C)  TempSrc: Oral Oral  Oral  Resp: 16 17  18   Height:      Weight:   192 lb 14.4 oz (87.499 kg)   SpO2:  95%  96%    GEN- The patient is well appearing, alert and oriented x 3 today.   HEENT: normocephalic, atraumatic; sclera clear, conjunctiva pink; hearing intact; oropharynx clear;  Lungs- Clear to ausculation bilaterally, normal work of breathing.  No wheezes, rales, rhonchi Heart- Regular rate and rhythm, no murmurs, rubs or gallops, PMI not laterally displaced GI- soft, non-tender, non-distended, bowel sounds present, no hepatosplenomegaly Extremities- no clubbing, cyanosis, or edema; DP/PT/radial pulses 2+ bilaterally Skin- warm and dry, no rash or lesion, left chest without hematoma/ecchymosis, small amount of old blood on steri strips  Psych- euthymic mood, full affect    Labs:   Lab Results  Component Value Date   WBC 7.7 08/25/2014   HGB 13.9 08/25/2014   HCT 40.0 08/25/2014   MCV 94.4 08/25/2014   PLT 201.0 08/25/2014   No results for input(s): NA, K, CL, CO2, BUN, CREATININE, CALCIUM, PROT, BILITOT, ALKPHOS, ALT, AST, GLUCOSE in the last 168 hours.  Invalid input(s): LABALBU  Discharge Medications:    Medication List    TAKE these medications        acetaminophen 325 MG tablet  Commonly known as:  TYLENOL  Take 1-2 tablets (325-650 mg total) by mouth every 4 (four) hours as  needed for mild pain.     atorvastatin 40 MG tablet  Commonly known as:  LIPITOR  Take 1 tablet (40 mg total) by mouth daily at 6 PM.     carvedilol 6.25 MG tablet  Commonly known as:  COREG  Take 1.5 tablets (9.375 mg total) by mouth 2 (two) times daily with a meal.     furosemide 40 MG tablet  Commonly known as:  LASIX  Take 1 tablet (40 mg total) by  mouth daily.     HAIR/SKIN/NAILS PO  Take 1 tablet by mouth daily.     lisinopril 5 MG tablet  Commonly known as:  PRINIVIL,ZESTRIL  Take 1 tablet (5 mg total) by mouth 2 (two) times daily.     spironolactone 25 MG tablet  Commonly known as:  ALDACTONE  Take 1 tablet (25 mg total) by mouth daily.     warfarin 5 MG tablet  Commonly known as:  COUMADIN  Take 5-7.5 mg by mouth daily at 6 PM. 1.5 tablets on Sunday and Thursday and 1 tablet all other days.        Disposition:   Follow-up Information    Follow up with [facility] On 09/08/2014.   Why:  at 10AM for wound check; coumadin clinic visit at 11:15AM   Contact information:   [address] [place] [address]       Follow up with Marily Lente, NP On 10/19/2014.   Specialty:  Nurse Practitioner   Why:  at 1:30PM   Contact information:   [address] [place] [address] [phone]       Duration of Discharge Encounter: Greater than 30 minutes including physician time.  Signed, Gypsy Balsam, NP 09/02/2014 8:09 AM   EP Attending  Patient seen and examined. Agree with above.   Leonia Reeves.D.

## 2014-09-02 ENCOUNTER — Ambulatory Visit (HOSPITAL_COMMUNITY): Payer: Medicaid Other

## 2014-09-02 DIAGNOSIS — I255 Ischemic cardiomyopathy: Secondary | ICD-10-CM | POA: Diagnosis not present

## 2014-09-02 DIAGNOSIS — I447 Left bundle-branch block, unspecified: Secondary | ICD-10-CM | POA: Diagnosis not present

## 2014-09-02 DIAGNOSIS — I2510 Atherosclerotic heart disease of native coronary artery without angina pectoris: Secondary | ICD-10-CM | POA: Diagnosis not present

## 2014-09-02 DIAGNOSIS — I5022 Chronic systolic (congestive) heart failure: Secondary | ICD-10-CM | POA: Diagnosis not present

## 2014-09-02 LAB — PROTIME-INR
INR: 1.94 — ABNORMAL HIGH (ref 0.00–1.49)
PROTHROMBIN TIME: 22.4 s — AB (ref 11.6–15.2)

## 2014-09-02 MED ORDER — ACETAMINOPHEN 325 MG PO TABS: 325.0000 mg | ORAL_TABLET | ORAL | Status: AC | PRN

## 2014-09-02 MED ORDER — LISINOPRIL 5 MG PO TABS: 5.0000 mg | ORAL_TABLET | Freq: Two times a day (BID) | ORAL | Status: DC

## 2014-09-02 NOTE — Plan of Care (Signed)
Problem: Discharge Progression Outcomes Goal: Pain controlled with appropriate interventions Outcome: Not Applicable Date Met:  75/88/32 Denies pain

## 2014-09-02 NOTE — Progress Notes (Signed)
Showed ICD video and used teach back for understanding.  Pt stated understanding of video info and written discharge instructions, including f/u appointment, progression of activity, and site care.  Denies pain.

## 2014-09-02 NOTE — Progress Notes (Signed)
At 1215 pt d/c off floor via w/c by NT to awaiting transport after d/c instructions given by charge nurse.  Amanda PeaNellie Coretha Creswell, Charity fundraiserN.

## 2014-09-07 ENCOUNTER — Ambulatory Visit: Payer: Medicaid Other

## 2014-09-08 ENCOUNTER — Ambulatory Visit (INDEPENDENT_AMBULATORY_CARE_PROVIDER_SITE_OTHER): Payer: Medicaid Other | Admitting: *Deleted

## 2014-09-08 ENCOUNTER — Encounter: Payer: Self-pay | Admitting: Internal Medicine

## 2014-09-08 DIAGNOSIS — Z95810 Presence of automatic (implantable) cardiac defibrillator: Secondary | ICD-10-CM | POA: Diagnosis not present

## 2014-09-08 DIAGNOSIS — I513 Intracardiac thrombosis, not elsewhere classified: Secondary | ICD-10-CM

## 2014-09-08 DIAGNOSIS — I5022 Chronic systolic (congestive) heart failure: Secondary | ICD-10-CM | POA: Diagnosis not present

## 2014-09-08 DIAGNOSIS — I447 Left bundle-branch block, unspecified: Secondary | ICD-10-CM

## 2014-09-08 DIAGNOSIS — Z5181 Encounter for therapeutic drug level monitoring: Secondary | ICD-10-CM | POA: Diagnosis not present

## 2014-09-08 DIAGNOSIS — I255 Ischemic cardiomyopathy: Secondary | ICD-10-CM | POA: Diagnosis not present

## 2014-09-08 DIAGNOSIS — I213 ST elevation (STEMI) myocardial infarction of unspecified site: Secondary | ICD-10-CM | POA: Diagnosis not present

## 2014-09-08 LAB — MDC_IDC_ENUM_SESS_TYPE_INCLINIC
Battery Voltage: 3.03 V
Brady Statistic AP VS Percent: 0.43 %
Brady Statistic AS VP Percent: 84.28 %
Brady Statistic AS VS Percent: 3.7 %
Brady Statistic RV Percent Paced: 94.83 %
Date Time Interrogation Session: 20160421110158
HighPow Impedance: 190 Ohm
HighPow Impedance: 69 Ohm
Lead Channel Impedance Value: 418 Ohm
Lead Channel Impedance Value: 532 Ohm
Lead Channel Pacing Threshold Amplitude: 0.5 V
Lead Channel Pacing Threshold Pulse Width: 0.4 ms
Lead Channel Sensing Intrinsic Amplitude: 0.5 mV
Lead Channel Sensing Intrinsic Amplitude: 0.75 mV
Lead Channel Sensing Intrinsic Amplitude: 10.875 mV
Lead Channel Sensing Intrinsic Amplitude: 15.125 mV
Lead Channel Setting Pacing Pulse Width: 0.03 ms
Lead Channel Setting Sensing Sensitivity: 0.3 mV
MDC IDC MSMT BATTERY REMAINING LONGEVITY: 76 mo
MDC IDC SET LEADCHNL LV PACING AMPLITUDE: 3 V
MDC IDC SET LEADCHNL LV PACING PULSEWIDTH: 0.8 ms
MDC IDC SET LEADCHNL RA PACING AMPLITUDE: 3.5 V
MDC IDC SET LEADCHNL RV PACING AMPLITUDE: 0.5 V
MDC IDC SET ZONE DETECTION INTERVAL: 350 ms
MDC IDC SET ZONE DETECTION INTERVAL: 360 ms
MDC IDC SET ZONE DETECTION INTERVAL: 450 ms
MDC IDC STAT BRADY AP VP PERCENT: 11.6 %
MDC IDC STAT BRADY RA PERCENT PACED: 12.03 %
Zone Setting Detection Interval: 300 ms

## 2014-09-08 LAB — POCT INR: INR: 2.9

## 2014-09-08 NOTE — Progress Notes (Signed)
CRT-D wound check appointment. Steri-strips removed. Wound without redness or edema. Incision edges approximated, wound well healed. Normal device function. Thresholds, sensing, and impedances consistent with implant measurements. Device programmed at appropriate extra safety margin until 3 month visit---RV output intentionally programmed per GT. Histogram distribution appropriate for patient and level of activity. No mode switches or ventricular arrhythmias noted. Patient educated about wound care, arm mobility, lifting restrictions, shock plan. ROV w/ AS 10/19/14.

## 2014-09-29 ENCOUNTER — Ambulatory Visit (INDEPENDENT_AMBULATORY_CARE_PROVIDER_SITE_OTHER): Payer: Medicaid Other | Admitting: *Deleted

## 2014-09-29 DIAGNOSIS — I513 Intracardiac thrombosis, not elsewhere classified: Secondary | ICD-10-CM

## 2014-09-29 DIAGNOSIS — I213 ST elevation (STEMI) myocardial infarction of unspecified site: Secondary | ICD-10-CM

## 2014-09-29 DIAGNOSIS — Z5181 Encounter for therapeutic drug level monitoring: Secondary | ICD-10-CM | POA: Diagnosis not present

## 2014-09-29 LAB — POCT INR: INR: 2.2

## 2014-10-18 ENCOUNTER — Encounter: Payer: Self-pay | Admitting: Nurse Practitioner

## 2014-10-19 ENCOUNTER — Encounter: Payer: Self-pay | Admitting: Nurse Practitioner

## 2014-10-19 ENCOUNTER — Ambulatory Visit (INDEPENDENT_AMBULATORY_CARE_PROVIDER_SITE_OTHER): Payer: Medicaid Other | Admitting: Nurse Practitioner

## 2014-10-19 VITALS — BP 92/64 | HR 58 | Ht 70.0 in | Wt 199.0 lb

## 2014-10-19 DIAGNOSIS — I213 ST elevation (STEMI) myocardial infarction of unspecified site: Secondary | ICD-10-CM

## 2014-10-19 DIAGNOSIS — Z720 Tobacco use: Secondary | ICD-10-CM | POA: Diagnosis not present

## 2014-10-19 DIAGNOSIS — I255 Ischemic cardiomyopathy: Secondary | ICD-10-CM | POA: Diagnosis not present

## 2014-10-19 DIAGNOSIS — I5022 Chronic systolic (congestive) heart failure: Secondary | ICD-10-CM

## 2014-10-19 DIAGNOSIS — I447 Left bundle-branch block, unspecified: Secondary | ICD-10-CM

## 2014-10-19 DIAGNOSIS — I513 Intracardiac thrombosis, not elsewhere classified: Secondary | ICD-10-CM

## 2014-10-19 LAB — BASIC METABOLIC PANEL
BUN: 30 mg/dL — ABNORMAL HIGH (ref 6–23)
CALCIUM: 10 mg/dL (ref 8.4–10.5)
CO2: 31 mEq/L (ref 19–32)
Chloride: 99 mEq/L (ref 96–112)
Creatinine, Ser: 1.17 mg/dL (ref 0.40–1.50)
GFR: 67.77 mL/min (ref >60.00)
GLUCOSE: 82 mg/dL (ref 70–99)
POTASSIUM: 4.2 meq/L (ref 3.5–5.1)
Sodium: 134 mEq/L — ABNORMAL LOW (ref 135–145)

## 2014-10-19 NOTE — Progress Notes (Signed)
Electrophysiology Office Note Date: 10/19/2014  ID:  Joseph Carson, DOB 1955-09-28, MRN [id number]  PCP: Doris CheadleADVANI, DEEPAK, MD Primary Cardiologist: Shirlee LatchMcLean Electrophysiologist: Ladona Ridgelaylor  CC: 6 week CRTD follow up  Joseph SiresMichael Carson is a 59 y.o. male is seen today for Dr Ladona Ridgelaylor.  He presents today for routine electrophysiology followup.  He underwent MDT CRTD implantation 08/2014 for ICM, CHF, LBBB. Since last being seen in our clinic, the patient reports doing very well. He denies chest pain, palpitations, dyspnea, PND, orthopnea, nausea, vomiting, dizziness, syncope, edema, weight gain, or early satiety.  He has not had ICD shocks. He remains active caring for his 59 year old grandson and has no real functional limitations. He feels that his energy level is improved post CRTD implant.   Device History: MDT CRTD implanted 08/2014 for ischemic cardiomyopathy, LBBB, and CHF History of appropriate therapy: No History of AAD therapy: No   Past Medical History  Diagnosis Date  . Chronic combined systolic and diastolic heart failure November 2014  . Obesity   . Pulmonary hypertension   . Hyperbilirubinemia   . Umbilical hernia   . Ischemic cardiomyopathy     a. echo (10/15):  EF 20%, diff HK, apical AK, mobile density inf wall near papillary muscle (?ruptured) with mod MR, mod reduced RVSF, mild PI, PASP 37 mmHg b. s/p CRTD   . CAD (coronary artery disease)     a. s/p CABG x 3 03/2013 (L-LAD, S-RI, S-PDA) in [place]  . LV (left ventricular) mural thrombus      a. TEE (10/15):  EF 15-20%, septum and ant walls AK, lat wall HK, no papillary muscle rupture, probable small thrombus on ant/ant-septal wall, mild MR, mod decreased RVSF, neg bubble study  . NSVT (nonsustained ventricular tachycardia)         . Mitral regurgitation      mild by TEE 02/2014  . LBBB (left bundle branch block)    . HLD (hyperlipidemia)     Past Surgical History  Procedure Laterality Date  . Patella fracture surgery Left  1972  . Cardiac catheterization  03/2013  . Coronary artery bypass graft  03/2013    LIMA to the LAD, SVG to the ramus, SVG to the PDA  . Tee without cardioversion N/A 03/04/2014    Procedure: TRANSESOPHAGEAL ECHOCARDIOGRAM (TEE);  Surgeon: Laurey Moralealton S McLean, MD;  [facility];  Service: Cardiovascular;  Laterality: N/A;  . Bi-ventricular implantable cardioverter defibrillator N/A 09/01/2014    MDT CRTD implanted by Dr Ladona Ridgelaylor    Current Outpatient Prescriptions  Medication Sig Dispense Refill  . acetaminophen (TYLENOL) 325 MG tablet Take 1-2 tablets (325-650 mg total) by mouth every 4 (four) hours as needed for mild pain.    Marland Kitchen. atorvastatin (LIPITOR) 40 MG tablet Take 1 tablet (40 mg total) by mouth daily at 6 PM. 30 tablet 6  . carvedilol (COREG) 6.25 MG tablet Take 1.5 tablets (9.375 mg total) by mouth 2 (two) times daily with a meal. 75 tablet 6  . furosemide (LASIX) 40 MG tablet Take 1 tablet (40 mg total) by mouth daily. 30 tablet 0  . lisinopril (PRINIVIL,ZESTRIL) 5 MG tablet Take 1 tablet (5 mg total) by mouth 2 (two) times daily. 60 tablet 3  . Multiple Vitamins-Minerals (HAIR/SKIN/NAILS PO) Take 1 tablet by mouth daily.    Marland Kitchen. spironolactone (ALDACTONE) 25 MG tablet Take 1 tablet (25 mg total) by mouth daily. 30 tablet 0  . warfarin (COUMADIN) 5 MG tablet Take 5-7.5 mg  by mouth daily at 6 PM. 1.5 tablets on Sunday and Thursday and 1 tablet all other days.     No current facility-administered medications for this visit.    Allergies:   Review of patient's allergies indicates no known allergies.   Social History: History   Social History  . Marital Status: Single    Spouse Name: N/A  . Number of Children: N/A  . Years of Education: N/A   Occupational History  . Not on file.   Social History Main Topics  . Smoking status: Current Some Day Smoker -- 0.10 packs/day for 43 years    Types: Cigarettes  . Smokeless tobacco: Never Used     Comment: admits to smoking  1cigarette/day  . Alcohol Use: No  . Drug Use: Yes    Special: Marijuana     Comment: 03/01/2014 "used last ~ 2 months ago"  . Sexual Activity: Not Currently   Other Topics Concern  . Not on file   Social History Narrative    Family History: Family History  Problem Relation Age of Onset  . Diabetes Mother     Deceased  . Thyroid disease Mother     Review of Systems: All other systems reviewed and are otherwise negative except as noted above.   Physical Exam: VS:  BP 92/64 mmHg  Pulse 58  Ht  (1.778 m)  Wt 199 lb (90.266 kg)  BMI 28.55 kg/m2 , BMI Body mass index is 28.55 kg/(m^2).  GEN- The patient is well appearing, alert and oriented x 3 today.   HEENT: normocephalic, atraumatic; sclera clear, conjunctiva pink; hearing intact; oropharynx clear; neck supple, no JVP Lymph- no cervical lymphadenopathy Lungs- Clear to ausculation bilaterally, normal work of breathing.  No wheezes, rales, rhonchi Heart- Regular rate and rhythm (paced) GI- soft, non-tender, non-distended, bowel sounds present Extremities- no clubbing, cyanosis, or edema; DP/PT/radial pulses 2+ bilaterally MS- no significant deformity or atrophy Skin- warm and dry, no rash or lesion; ICD pocket well healed Psych- euthymic mood, full affect Neuro- strength and sensation are intact  ICD interrogation- reviewed in detail today,  See PACEART report  EKG:  EKG is ordered today. The ekg ordered today shows sinus rhythm with ventricular pacing, QRS 154  Recent Labs: 03/01/2014: ALT 21 03/03/2014: TSH 13.440* 03/05/2014: Magnesium 1.8 03/10/2014: Pro B Natriuretic peptide (BNP) 961.5* 08/25/2014: BUN 30*; Creatinine 1.27; Hemoglobin 13.9; Platelets 201.0; Potassium 3.9; Sodium 135   Wt Readings from Last 3 Encounters:  10/19/14 199 lb (90.266 kg)  09/02/14 192 lb 14.4 oz (87.499 kg)  07/28/14 191 lb 6.4 oz (86.818 kg)     Other studies Reviewed: Additional studies/ records that were reviewed  today include: hospital records  Assessment and Plan:  1.  Chronic systolic dysfunction Normal ICD function See Pace Art report No changes today Euvolemic on exam Will need repeat echo 6 months post CRTD implant (02/2015) BMET today  2.  CAD No recent ischemic symptoms Continue statin/BB  3.  LV thrombus Continue Warfarin  4.  Tobacco abuse Cessation advised - he is down to 1/2 ppd   Current medicines are reviewed at length with the patient today.   The patient does not have concerns regarding his medicines.  The following changes were made today:  none  Labs/ tests ordered today include: BMET  Disposition:   Follow up with Dr Ladona Ridgel in 6 weeks, follow up with Dr Shirlee Latch in HF clinic next available (was due 09/2014).  Will need repeat echo  6 months post CRTD implant (02/2015)   Signed, Gypsy Balsam, NP 10/19/2014 2:00 PM  [facility] [address] [place] [address] [phone] (office) [phone] (fax)

## 2014-10-19 NOTE — Patient Instructions (Signed)
Medication Instructions:  Your physician recommends that you continue on your current medications as directed. Please refer to the Current Medication list given to you today.   Labwork: Bmet today   Testing/Procedures: None   Follow-Up: Your physician recommends that you schedule a follow-up appointment in: 6 weeks with Dr.Taylor  Your physician recommends that you schedule a follow-up appointment next available with the CHF clinic   Any Other Special Instructions Will Be Listed Below (If Applicable).

## 2014-10-23 LAB — CUP PACEART INCLINIC DEVICE CHECK
Lead Channel Setting Pacing Amplitude: 3 V
Lead Channel Setting Pacing Pulse Width: 0.03 ms
Lead Channel Setting Sensing Sensitivity: 0.3 mV
MDC IDC SESS DTM: 20160605111802
MDC IDC SET LEADCHNL LV PACING PULSEWIDTH: 0.8 ms
MDC IDC SET LEADCHNL RA PACING AMPLITUDE: 3.5 V
MDC IDC SET LEADCHNL RV PACING AMPLITUDE: 0.5 V
Zone Setting Detection Interval: 300 ms
Zone Setting Detection Interval: 350 ms
Zone Setting Detection Interval: 360 ms
Zone Setting Detection Interval: 450 ms

## 2014-10-26 ENCOUNTER — Other Ambulatory Visit: Payer: Self-pay

## 2014-10-26 MED ORDER — ATORVASTATIN CALCIUM 40 MG PO TABS: 40.0000 mg | ORAL_TABLET | Freq: Every day | ORAL | Status: DC

## 2014-10-27 ENCOUNTER — Ambulatory Visit (INDEPENDENT_AMBULATORY_CARE_PROVIDER_SITE_OTHER): Payer: Medicaid Other | Admitting: *Deleted

## 2014-10-27 ENCOUNTER — Other Ambulatory Visit: Payer: Self-pay

## 2014-10-27 DIAGNOSIS — I213 ST elevation (STEMI) myocardial infarction of unspecified site: Secondary | ICD-10-CM | POA: Diagnosis not present

## 2014-10-27 DIAGNOSIS — I513 Intracardiac thrombosis, not elsewhere classified: Secondary | ICD-10-CM

## 2014-10-27 DIAGNOSIS — Z5181 Encounter for therapeutic drug level monitoring: Secondary | ICD-10-CM | POA: Diagnosis not present

## 2014-10-27 LAB — POCT INR: INR: 2.7

## 2014-10-27 MED ORDER — SPIRONOLACTONE 25 MG PO TABS: 25.0000 mg | ORAL_TABLET | Freq: Every day | ORAL | Status: DC

## 2014-10-27 MED ORDER — WARFARIN SODIUM 5 MG PO TABS: ORAL_TABLET | ORAL | Status: DC

## 2014-10-27 MED ORDER — ATORVASTATIN CALCIUM 40 MG PO TABS: 40.0000 mg | ORAL_TABLET | Freq: Every day | ORAL | Status: DC

## 2014-10-27 MED ORDER — FUROSEMIDE 40 MG PO TABS: 40.0000 mg | ORAL_TABLET | Freq: Every day | ORAL | Status: DC

## 2014-10-27 NOTE — Telephone Encounter (Signed)
Per note 6.1.16 

## 2014-11-07 ENCOUNTER — Encounter (HOSPITAL_COMMUNITY): Payer: Self-pay

## 2014-11-07 ENCOUNTER — Ambulatory Visit (HOSPITAL_COMMUNITY)
Admission: RE | Admit: 2014-11-07 | Discharge: 2014-11-07 | Disposition: A | Discharge status: Home / Self Care | Payer: Medicaid Other | Source: Ambulatory Visit | Attending: Cardiology | Admitting: Cardiology

## 2014-11-07 VITALS — BP 110/62 | HR 62 | Wt 203.5 lb

## 2014-11-07 DIAGNOSIS — I5022 Chronic systolic (congestive) heart failure: Secondary | ICD-10-CM | POA: Insufficient documentation

## 2014-11-07 DIAGNOSIS — I82890 Acute embolism and thrombosis of other specified veins: Secondary | ICD-10-CM | POA: Diagnosis not present

## 2014-11-07 DIAGNOSIS — I471 Supraventricular tachycardia: Secondary | ICD-10-CM | POA: Diagnosis not present

## 2014-11-07 DIAGNOSIS — E785 Hyperlipidemia, unspecified: Secondary | ICD-10-CM | POA: Diagnosis not present

## 2014-11-07 DIAGNOSIS — I2510 Atherosclerotic heart disease of native coronary artery without angina pectoris: Secondary | ICD-10-CM | POA: Diagnosis not present

## 2014-11-07 DIAGNOSIS — Z7901 Long term (current) use of anticoagulants: Secondary | ICD-10-CM | POA: Insufficient documentation

## 2014-11-07 DIAGNOSIS — I5043 Acute on chronic combined systolic (congestive) and diastolic (congestive) heart failure: Secondary | ICD-10-CM

## 2014-11-07 DIAGNOSIS — Z951 Presence of aortocoronary bypass graft: Secondary | ICD-10-CM | POA: Diagnosis not present

## 2014-11-07 DIAGNOSIS — Z720 Tobacco use: Secondary | ICD-10-CM | POA: Diagnosis not present

## 2014-11-07 LAB — BASIC METABOLIC PANEL
Anion gap: 8 (ref 5–15)
BUN: 23 mg/dL — AB (ref 6–20)
CALCIUM: 9.8 mg/dL (ref 8.9–10.3)
CO2: 28 mmol/L (ref 22–32)
Chloride: 103 mmol/L (ref 101–111)
Creatinine, Ser: 1.13 mg/dL (ref 0.61–1.24)
GFR calc Af Amer: >60 mL/min (ref >60)
Glucose, Bld: 92 mg/dL (ref 65–99)
Potassium: 4.3 mmol/L (ref 3.5–5.1)
Sodium: 139 mmol/L (ref 135–145)

## 2014-11-07 LAB — LIPID PANEL
CHOL/HDL RATIO: 2.6 ratio
Cholesterol: 126 mg/dL (ref 0–200)
HDL: 49 mg/dL (ref >40)
LDL CALC: 62 mg/dL (ref 0–99)
Triglycerides: 76 mg/dL (ref <150)
VLDL: 15 mg/dL (ref 0–40)

## 2014-11-07 MED ORDER — CARVEDILOL 6.25 MG PO TABS: 12.5000 mg | ORAL_TABLET | Freq: Two times a day (BID) | ORAL | Status: DC

## 2014-11-07 NOTE — Progress Notes (Signed)
Patient ID: Joseph Carson, male   DOB: 25-May-1955, 59 y.o.   MRN: [id number] PCP: Dr. Orpah Cobb  59 yo with history of CAD s/p CABG and ischemic cardiomyopathy. In 11/14, he had CABG in [place].  Echo at that time showed EF "19%."  He then left [place] and moved to [place] to live with his daughter and did not have any further medical contact until late 2015.  He was not taking any medications.  He gained about 50 lbs over this period.  He became severely short of breath gradually along with significant orthopnea.  No chest pain.  He finally came to the ER and was admitted with acute on chronic systolic CHF and massive volume overload.  EF was 20% by echo with small LV thrombus seen on TEE.  RV was mildly dilated with moderately decreased systolic function.  Diuresed with IV Lasix and started back on cardiac meds.  He lost almost 50 lbs in the hospital.    He returns for follow up. Overall feels good. Denies SOB/PND/Orthopnea. Can walk about a mile prior to getting SOB. Smoking 1/2 pack a day.   Taking medications as prescribed. Occasional dizziness with position changes. No bleeding problems. Said he had some hair loss from several of his medication but is taking OTC hair and nails supplement and it has filled back in. Successful Medtronic CRT-D implantation 08/2014, feels like energy level has improved. Last check 10/19/2014. Has not had any ICD shocks.  Echo 06/16/2014:  EF remains low at 20% with severe LV dilation.    Labs (10/15): K 5.1, creatinine 1.03, BNP 962 Labs (04/12/14): K 4.5 Creatinine 1.01  Labs (12/15): K 4.1, creatinine 1.05 Labs (08/25/14): K 3.9, Creatinine 1.27 Labs (10/18/13): K 4.2, Creatinine 1.17  PMH: 1. CAD: CABG x 3 11/14 in [place], LIMA-LAD/SVG-ramus/SVG-PDA.   2. Ischemic cardiomyopathy: 11/14 echo [facility]) with EF 19%.  Echo (10/15) with EF 20%, severe diffuse hypokinesis with akinesis of apex, mild MR, mildly dilated RV with moderately decreased systolic function.  TEE (10/15) with EF 15-20%,  septal and anterior akinesis, basal anterior/basal anteroseptal small thrombus attached to an akinetic segment, mild MR.   - Echo (1/16) with EF 20%, severe LV dilation, normal RV size with moderately decreased RV systolic function, no LV thrombus noted. - Medtronic CRT-D 4/16.  3. LV thrombus: Diagnosed by TEE 10/15. Not seen on 1/16 echo.  4. NSVT  SH: Smoker (back up to 1/2 ppd from 1/4 ppd last visit), lives with daughter in [place], moved from [place] in 2015.    FH: CAD  ROS: All systems reviewed and negative except as per HPI.   Current Outpatient Prescriptions  Medication Sig Dispense Refill  . acetaminophen (TYLENOL) 325 MG tablet Take 1-2 tablets (325-650 mg total) by mouth every 4 (four) hours as needed for mild pain.    Marland Kitchen atorvastatin (LIPITOR) 40 MG tablet Take 1 tablet (40 mg total) by mouth daily at 6 PM. 30 tablet 6  . carvedilol (COREG) 6.25 MG tablet Take 1.5 tablets (9.375 mg total) by mouth 2 (two) times daily with a meal. 75 tablet 6  . furosemide (LASIX) 40 MG tablet Take 1 tablet (40 mg total) by mouth daily. 30 tablet 6  . lisinopril (PRINIVIL,ZESTRIL) 5 MG tablet Take 1 tablet (5 mg total) by mouth 2 (two) times daily. 60 tablet 3  . Multiple Vitamins-Minerals (HAIR/SKIN/NAILS PO) Take 1 tablet by mouth daily.    Marland Kitchen spironolactone (ALDACTONE) 25 MG tablet Take 1 tablet (  25 mg total) by mouth daily. 30 tablet 6  . warfarin (COUMADIN) 5 MG tablet Take as directed by coumadin clinic 40 tablet 3   No current facility-administered medications for this encounter.    There were no vitals filed for this visit. General: NAD Neck: JVP 5-6 cm, no thyromegaly or thyroid nodule.  Lungs: Scattered light wheezes that clear with coughing. Normal respiratory effort. CV: Nondisplaced PMI.  Heart regular S1/S2, no S3/S4, no murmur.  No peripheral edema.  No carotid bruit.  Normal pedal pulses. LifeVest on Abdomen: Soft, nontender, no hepatosplenomegaly, no distention.  Skin:  Intact without lesions or rashes.  Neurologic: Alert and oriented x 3.  Psych: Normal affect. Extremities: No clubbing or cyanosis.  HEENT: Normal.   Assessment/Plan: 1. CAD: No chest pain.  S/P CABG 11/14.   - Continue statin.  On BB. No aspirin as he is on coumadin.  2. Hyperlipidemia: Cont statin.  Goal LDL < 70.  Check lipids today.  3. Chronic systolic CHF: Ischemic cardiomyopathy. Echo 05/2014 with EF 20%, severe LV dilation, and moderate RV systolic dysfunction. Now s/p Medtronic CRT-D.  NYHA class II symptoms and volume status stable.  - Continue Lasix 40 mg daily and 25 mg spironolactone.   - Continue lisinopril 5 mg bid.  - Increase Coreg to 12.5 mg bid.      4. NSVT: Noted in hospital.  On Coreg. ICD in place. 5. LV thrombus: Small thrombus noted on the akinetic basal anterior/anteroseptal wall.  Initially thought to be ruptured papillary muscle but by TEE looked like thrombus.  He is now on coumadin, check CBC today.  Given persistent severely dilated LV with EF 20%, will continue coumadin.   6. Smoking: Current. Smoking 1/2 ppd. Stressed the importance of stopping. Going to try nicotine patch, then medication if that doesn't work.  Follow up in 3 months. Labs today.  Jennifer Quintos [facility] 11/07/2014   Patient seen with PA, agree with the above note.  Doing well s/p CRT-D placement.  NYHA class II symptoms, not volume overloaded.  Increase Coreg to 12.5 mg bid.  Check lipids today.   Marca Ancona 11/07/2014

## 2014-11-07 NOTE — Patient Instructions (Signed)
INCREASE Carvedilol to 12.5mg  (2 tablets) twice a day  LABS today (lipids cbc bmet)  FOLLOW UP in 3 MONTHS.

## 2014-11-08 ENCOUNTER — Encounter: Payer: Self-pay | Admitting: Internal Medicine

## 2014-11-09 ENCOUNTER — Encounter (HOSPITAL_COMMUNITY): Payer: Self-pay | Admitting: *Deleted

## 2014-11-17 DIAGNOSIS — Z0271 Encounter for disability determination: Secondary | ICD-10-CM

## 2014-11-24 ENCOUNTER — Encounter: Payer: Self-pay | Admitting: Internal Medicine

## 2014-11-24 ENCOUNTER — Ambulatory Visit (INDEPENDENT_AMBULATORY_CARE_PROVIDER_SITE_OTHER): Payer: Medicaid Other | Admitting: Internal Medicine

## 2014-11-24 VITALS — BP 104/64 | HR 60 | Ht 70.0 in | Wt 207.8 lb

## 2014-11-24 DIAGNOSIS — I255 Ischemic cardiomyopathy: Secondary | ICD-10-CM | POA: Diagnosis not present

## 2014-11-24 DIAGNOSIS — I5022 Chronic systolic (congestive) heart failure: Secondary | ICD-10-CM

## 2014-11-24 DIAGNOSIS — Z95810 Presence of automatic (implantable) cardiac defibrillator: Secondary | ICD-10-CM

## 2014-11-24 DIAGNOSIS — I447 Left bundle-branch block, unspecified: Secondary | ICD-10-CM | POA: Diagnosis not present

## 2014-11-24 LAB — CUP PACEART INCLINIC DEVICE CHECK
Battery Remaining Longevity: 95 mo
Battery Voltage: 3.03 V
Brady Statistic AP VS Percent: 1.87 %
Brady Statistic AS VP Percent: 71.65 %
Brady Statistic AS VS Percent: 3.85 %
Brady Statistic RA Percent Paced: 24.5 %
Brady Statistic RV Percent Paced: 90.25 %
HIGH POWER IMPEDANCE MEASURED VALUE: 171 Ohm
HighPow Impedance: 78 Ohm
Lead Channel Impedance Value: 418 Ohm
Lead Channel Impedance Value: 532 Ohm
Lead Channel Pacing Threshold Amplitude: 0.75 V
Lead Channel Pacing Threshold Pulse Width: 0.4 ms
Lead Channel Sensing Intrinsic Amplitude: 0.75 mV
Lead Channel Sensing Intrinsic Amplitude: 11.125 mV
Lead Channel Sensing Intrinsic Amplitude: 12.625 mV
Lead Channel Setting Pacing Amplitude: 2.5 V
Lead Channel Setting Sensing Sensitivity: 0.3 mV
MDC IDC MSMT LEADCHNL RA SENSING INTR AMPL: 0.75 mV
MDC IDC SESS DTM: 20160707095049
MDC IDC SET LEADCHNL LV PACING PULSEWIDTH: 0.8 ms
MDC IDC SET LEADCHNL RA PACING AMPLITUDE: 1.5 V
MDC IDC SET LEADCHNL RV PACING AMPLITUDE: 0.5 V
MDC IDC SET LEADCHNL RV PACING PULSEWIDTH: 0.03 ms
MDC IDC SET ZONE DETECTION INTERVAL: 360 ms
MDC IDC SET ZONE DETECTION INTERVAL: 450 ms
MDC IDC STAT BRADY AP VP PERCENT: 22.63 %
Zone Setting Detection Interval: 300 ms
Zone Setting Detection Interval: 350 ms

## 2014-11-24 NOTE — Progress Notes (Signed)
HPI Mr. Allison Quarryrendt returns today for followup. He has longstanding chronic systolic heart failure, and LBBB and underwent Biv ICD implant 3 months ago. He has done well. He has not been quite as active due to the summer heat. He has had no edema, syncope or ICD shocks. No chest pain. His CHF is class 2.  No Known Allergies   Current Outpatient Prescriptions  Medication Sig Dispense Refill  . acetaminophen (TYLENOL) 325 MG tablet Take 1-2 tablets (325-650 mg total) by mouth every 4 (four) hours as needed for mild pain.    Marland Kitchen. atorvastatin (LIPITOR) 40 MG tablet Take 1 tablet (40 mg total) by mouth daily at 6 PM. 30 tablet 6  . carvedilol (COREG) 6.25 MG tablet Take 2 tablets (12.5 mg total) by mouth 2 (two) times daily with a meal. 120 tablet 6  . furosemide (LASIX) 40 MG tablet Take 1 tablet (40 mg total) by mouth daily. 30 tablet 6  . lisinopril (PRINIVIL,ZESTRIL) 5 MG tablet Take 1 tablet (5 mg total) by mouth 2 (two) times daily. 60 tablet 3  . Multiple Vitamins-Minerals (HAIR/SKIN/NAILS PO) Take 1 tablet by mouth daily.    Marland Kitchen. spironolactone (ALDACTONE) 25 MG tablet Take 1 tablet (25 mg total) by mouth daily. 30 tablet 6  . warfarin (COUMADIN) 5 MG tablet Take as directed by coumadin clinic 40 tablet 3   No current facility-administered medications for this visit.     Past Medical History  Diagnosis Date  . Chronic combined systolic and diastolic heart failure November 2014  . Obesity   . Pulmonary hypertension   . Hyperbilirubinemia   . Umbilical hernia   . Ischemic cardiomyopathy     a. echo (10/15):  EF 20%, diff HK, apical AK, mobile density inf wall near papillary muscle (?ruptured) with mod MR, mod reduced RVSF, mild PI, PASP 37 mmHg b. s/p CRTD   . CAD (coronary artery disease)     a. s/p CABG x 3 03/2013 (L-LAD, S-RI, S-PDA) in [place]  . LV (left ventricular) mural thrombus      a. TEE (10/15):  EF 15-20%, septum and ant walls AK, lat wall HK, no papillary muscle rupture,  probable small thrombus on ant/ant-septal wall, mild MR, mod decreased RVSF, neg bubble study  . NSVT (nonsustained ventricular tachycardia)         . Mitral regurgitation      mild by TEE 02/2014  . LBBB (left bundle branch block)    . HLD (hyperlipidemia)      ROS:   All systems reviewed and negative except as noted in the HPI.   Past Surgical History  Procedure Laterality Date  . Patella fracture surgery Left 1972  . Cardiac catheterization  03/2013  . Coronary artery bypass graft  03/2013    LIMA to the LAD, SVG to the ramus, SVG to the PDA  . Tee without cardioversion N/A 03/04/2014    Procedure: TRANSESOPHAGEAL ECHOCARDIOGRAM (TEE);  Surgeon: Laurey Moralealton S McLean, MD;  [facility];  Service: Cardiovascular;  Laterality: N/A;  . Bi-ventricular implantable cardioverter defibrillator N/A 09/01/2014    MDT CRTD implanted by Dr Ladona Ridgelaylor     Family History  Problem Relation Age of Onset  . Diabetes Mother     Deceased  . Thyroid disease Mother      History   Social History  . Marital Status: Single    Spouse Name: N/A  . Number of Children: N/A  . Years of  Education: N/A   Occupational History  . Not on file.   Social History Main Topics  . Smoking status: Current Some Day Smoker -- 0.10 packs/day for 43 years    Types: Cigarettes  . Smokeless tobacco: Never Used     Comment: admits to smoking 1cigarette/day  . Alcohol Use: No  . Drug Use: Yes    Special: Marijuana     Comment: 03/01/2014 "used last ~ 2 months ago"  . Sexual Activity: Not Currently   Other Topics Concern  . Not on file   Social History Narrative     There were no vitals taken for this visit.  Physical Exam:  Well appearing middle-aged man, NAD HEENT: Unremarkable Neck:  7 cm JVD, no thyromegally Back:  No CVA tenderness Lungs:  Clear, with no wheezes, rales, or rhonchi. HEART:  Regular rate rhythm, no murmurs, no rubs, no clicks Abd:  soft, positive bowel sounds, no  organomegally, no rebound, no guarding Ext:  2 plus pulses, no edema, no cyanosis, no clubbing Skin:  No rashes no nodules Neuro:  CN II through XII intact, motor grossly intact  EKG - normal sinus rhythm with ventricular pacing  Assess/Plan:

## 2014-11-24 NOTE — Assessment & Plan Note (Signed)
He denies anginal symptoms. No change in meds.  

## 2014-11-24 NOTE — Patient Instructions (Signed)
Medication Instructions:  Your physician recommends that you continue on your current medications as directed. Please refer to the Current Medication list given to you today.  Labwork: None ordered  Testing/Procedures: None ordered  Follow-Up: Remote monitoring is used to monitor your Pacemaker of ICD from home. This monitoring reduces the number of office visits required to check your device to one time per year. It allows us to keep an eye on the functioning of your device to ensure it is working properly. You are scheduled for a device check from home on 02/23/15. You may send your transmission at any time that day. If you have a wireless device, the transmission will be sent automatically. After your physician reviews your transmission, you will receive a postcard with your next transmission date.  Your physician wants you to follow-up in: 9 months with Dr. Ladona Ridgelaylor.  You will receive a reminder letter in the mail two months in advance. If you don't receive a letter, please call our office to schedule the follow-up appointment.   Thank you for choosing [facility]!!

## 2014-11-24 NOTE — Assessment & Plan Note (Signed)
His medtronic BiV ICD is working normally. He is pacing subthreshold in the RV as at implant we saw that his QRS widened with RV apical pacing. Will recheck in several months.

## 2014-11-24 NOTE — Assessment & Plan Note (Signed)
He has severe LV dysfunction His symptoms are class 2. He will continue his current meds. I have asked the patient to maintain a low sodium diet and increase his physical activity.

## 2014-11-30 ENCOUNTER — Encounter: Payer: Self-pay | Admitting: Cardiology

## 2014-12-01 ENCOUNTER — Ambulatory Visit (INDEPENDENT_AMBULATORY_CARE_PROVIDER_SITE_OTHER): Payer: Medicaid Other | Admitting: Pharmacist

## 2014-12-01 DIAGNOSIS — I213 ST elevation (STEMI) myocardial infarction of unspecified site: Secondary | ICD-10-CM | POA: Diagnosis not present

## 2014-12-01 DIAGNOSIS — I513 Intracardiac thrombosis, not elsewhere classified: Secondary | ICD-10-CM

## 2014-12-01 DIAGNOSIS — Z5181 Encounter for therapeutic drug level monitoring: Secondary | ICD-10-CM

## 2014-12-01 LAB — POCT INR: INR: 3.7

## 2014-12-29 ENCOUNTER — Ambulatory Visit (INDEPENDENT_AMBULATORY_CARE_PROVIDER_SITE_OTHER): Payer: Medicaid Other | Admitting: *Deleted

## 2014-12-29 DIAGNOSIS — Z5181 Encounter for therapeutic drug level monitoring: Secondary | ICD-10-CM

## 2014-12-29 DIAGNOSIS — I213 ST elevation (STEMI) myocardial infarction of unspecified site: Secondary | ICD-10-CM | POA: Diagnosis not present

## 2014-12-29 DIAGNOSIS — I513 Intracardiac thrombosis, not elsewhere classified: Secondary | ICD-10-CM

## 2014-12-29 LAB — POCT INR: INR: 2.8

## 2015-01-03 ENCOUNTER — Telehealth (HOSPITAL_COMMUNITY): Payer: Self-pay | Admitting: Vascular Surgery

## 2015-01-03 ENCOUNTER — Other Ambulatory Visit (HOSPITAL_COMMUNITY): Payer: Self-pay | Admitting: *Deleted

## 2015-01-03 DIAGNOSIS — I5022 Chronic systolic (congestive) heart failure: Secondary | ICD-10-CM

## 2015-01-03 MED ORDER — LISINOPRIL 5 MG PO TABS: 5.0000 mg | ORAL_TABLET | Freq: Two times a day (BID) | ORAL | Status: DC

## 2015-01-03 NOTE — Telephone Encounter (Signed)
Pt need refill Lisinpril sent to [facility].. Please advise

## 2015-01-03 NOTE — Telephone Encounter (Signed)
Open in error

## 2015-01-26 ENCOUNTER — Ambulatory Visit (INDEPENDENT_AMBULATORY_CARE_PROVIDER_SITE_OTHER): Payer: Medicaid Other

## 2015-01-26 DIAGNOSIS — I213 ST elevation (STEMI) myocardial infarction of unspecified site: Secondary | ICD-10-CM | POA: Diagnosis not present

## 2015-01-26 DIAGNOSIS — Z5181 Encounter for therapeutic drug level monitoring: Secondary | ICD-10-CM | POA: Diagnosis not present

## 2015-01-26 DIAGNOSIS — I513 Intracardiac thrombosis, not elsewhere classified: Secondary | ICD-10-CM

## 2015-01-26 LAB — POCT INR: INR: 3.8

## 2015-02-17 ENCOUNTER — Ambulatory Visit (INDEPENDENT_AMBULATORY_CARE_PROVIDER_SITE_OTHER): Payer: Medicaid Other

## 2015-02-17 DIAGNOSIS — Z5181 Encounter for therapeutic drug level monitoring: Secondary | ICD-10-CM

## 2015-02-17 DIAGNOSIS — I213 ST elevation (STEMI) myocardial infarction of unspecified site: Secondary | ICD-10-CM | POA: Diagnosis not present

## 2015-02-17 DIAGNOSIS — I513 Intracardiac thrombosis, not elsewhere classified: Secondary | ICD-10-CM

## 2015-02-17 LAB — POCT INR: INR: 2.2

## 2015-02-23 ENCOUNTER — Ambulatory Visit (INDEPENDENT_AMBULATORY_CARE_PROVIDER_SITE_OTHER): Payer: Medicaid Other | Admitting: *Deleted

## 2015-02-23 DIAGNOSIS — I255 Ischemic cardiomyopathy: Secondary | ICD-10-CM

## 2015-02-23 DIAGNOSIS — I5022 Chronic systolic (congestive) heart failure: Secondary | ICD-10-CM | POA: Diagnosis not present

## 2015-02-23 NOTE — Progress Notes (Signed)
Remote ICD transmission.   

## 2015-02-24 LAB — CUP PACEART REMOTE DEVICE CHECK
Battery Voltage: 3 V
Brady Statistic AP VP Percent: 26.26 %
Brady Statistic AS VP Percent: 66.67 %
Brady Statistic AS VS Percent: 5.89 %
Brady Statistic RA Percent Paced: 27.44 %
Brady Statistic RV Percent Paced: 87.86 %
Date Time Interrogation Session: 20161006120945
HIGH POWER IMPEDANCE MEASURED VALUE: 70 Ohm
Lead Channel Impedance Value: 342 Ohm
Lead Channel Impedance Value: 361 Ohm
Lead Channel Impedance Value: 399 Ohm
Lead Channel Impedance Value: 456 Ohm
Lead Channel Impedance Value: 532 Ohm
Lead Channel Impedance Value: 703 Ohm
Lead Channel Impedance Value: 703 Ohm
Lead Channel Impedance Value: 703 Ohm
Lead Channel Impedance Value: 779 Ohm
Lead Channel Pacing Threshold Amplitude: 0.625 V
Lead Channel Pacing Threshold Pulse Width: 0.4 ms
Lead Channel Sensing Intrinsic Amplitude: 0.75 mV
Lead Channel Setting Pacing Amplitude: 0.5 V
Lead Channel Setting Pacing Amplitude: 1.5 V
Lead Channel Setting Pacing Amplitude: 2.5 V
Lead Channel Setting Pacing Pulse Width: 0.03 ms
Lead Channel Setting Pacing Pulse Width: 0.8 ms
Lead Channel Setting Sensing Sensitivity: 0.3 mV
MDC IDC MSMT BATTERY REMAINING LONGEVITY: 88 mo
MDC IDC MSMT LEADCHNL LV IMPEDANCE VALUE: 456 Ohm
MDC IDC MSMT LEADCHNL LV IMPEDANCE VALUE: 456 Ohm
MDC IDC MSMT LEADCHNL LV IMPEDANCE VALUE: 779 Ohm
MDC IDC MSMT LEADCHNL RA IMPEDANCE VALUE: 475 Ohm
MDC IDC MSMT LEADCHNL RA SENSING INTR AMPL: 0.75 mV
MDC IDC MSMT LEADCHNL RV SENSING INTR AMPL: 17.75 mV
MDC IDC MSMT LEADCHNL RV SENSING INTR AMPL: 17.75 mV
MDC IDC SET ZONE DETECTION INTERVAL: 300 ms
MDC IDC SET ZONE DETECTION INTERVAL: 350 ms
MDC IDC STAT BRADY AP VS PERCENT: 1.18 %
Zone Setting Detection Interval: 360 ms
Zone Setting Detection Interval: 450 ms

## 2015-03-01 ENCOUNTER — Encounter: Payer: Self-pay | Admitting: Cardiology

## 2015-03-09 IMAGING — DX DG CHEST 2V
2 series · 2 of 2 positions shown · non-contrast
Comparison: 03/01/2014

CLINICAL DATA: Episodic chest pain for 5 days, productive cough.

EXAM:
CHEST  2 VIEW

[chest pa]
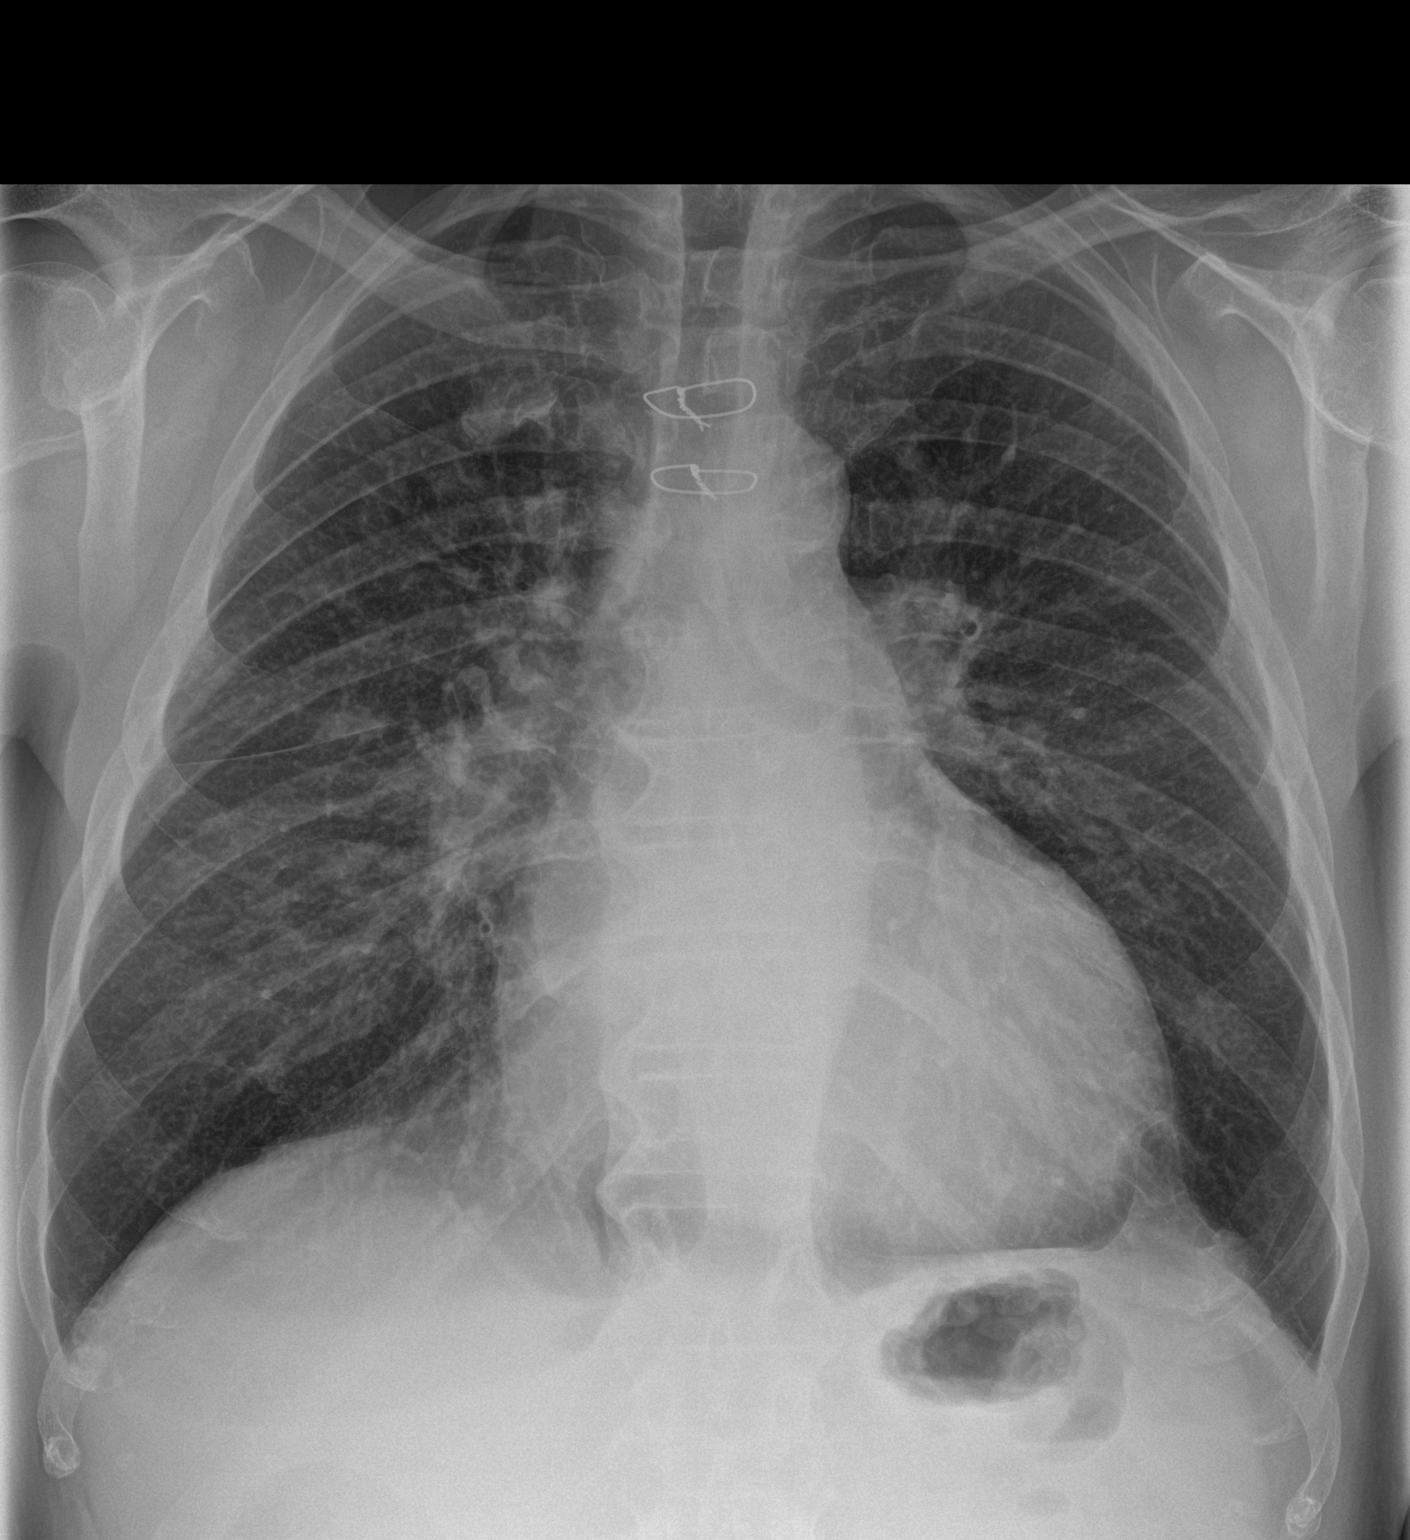

[chest lat]
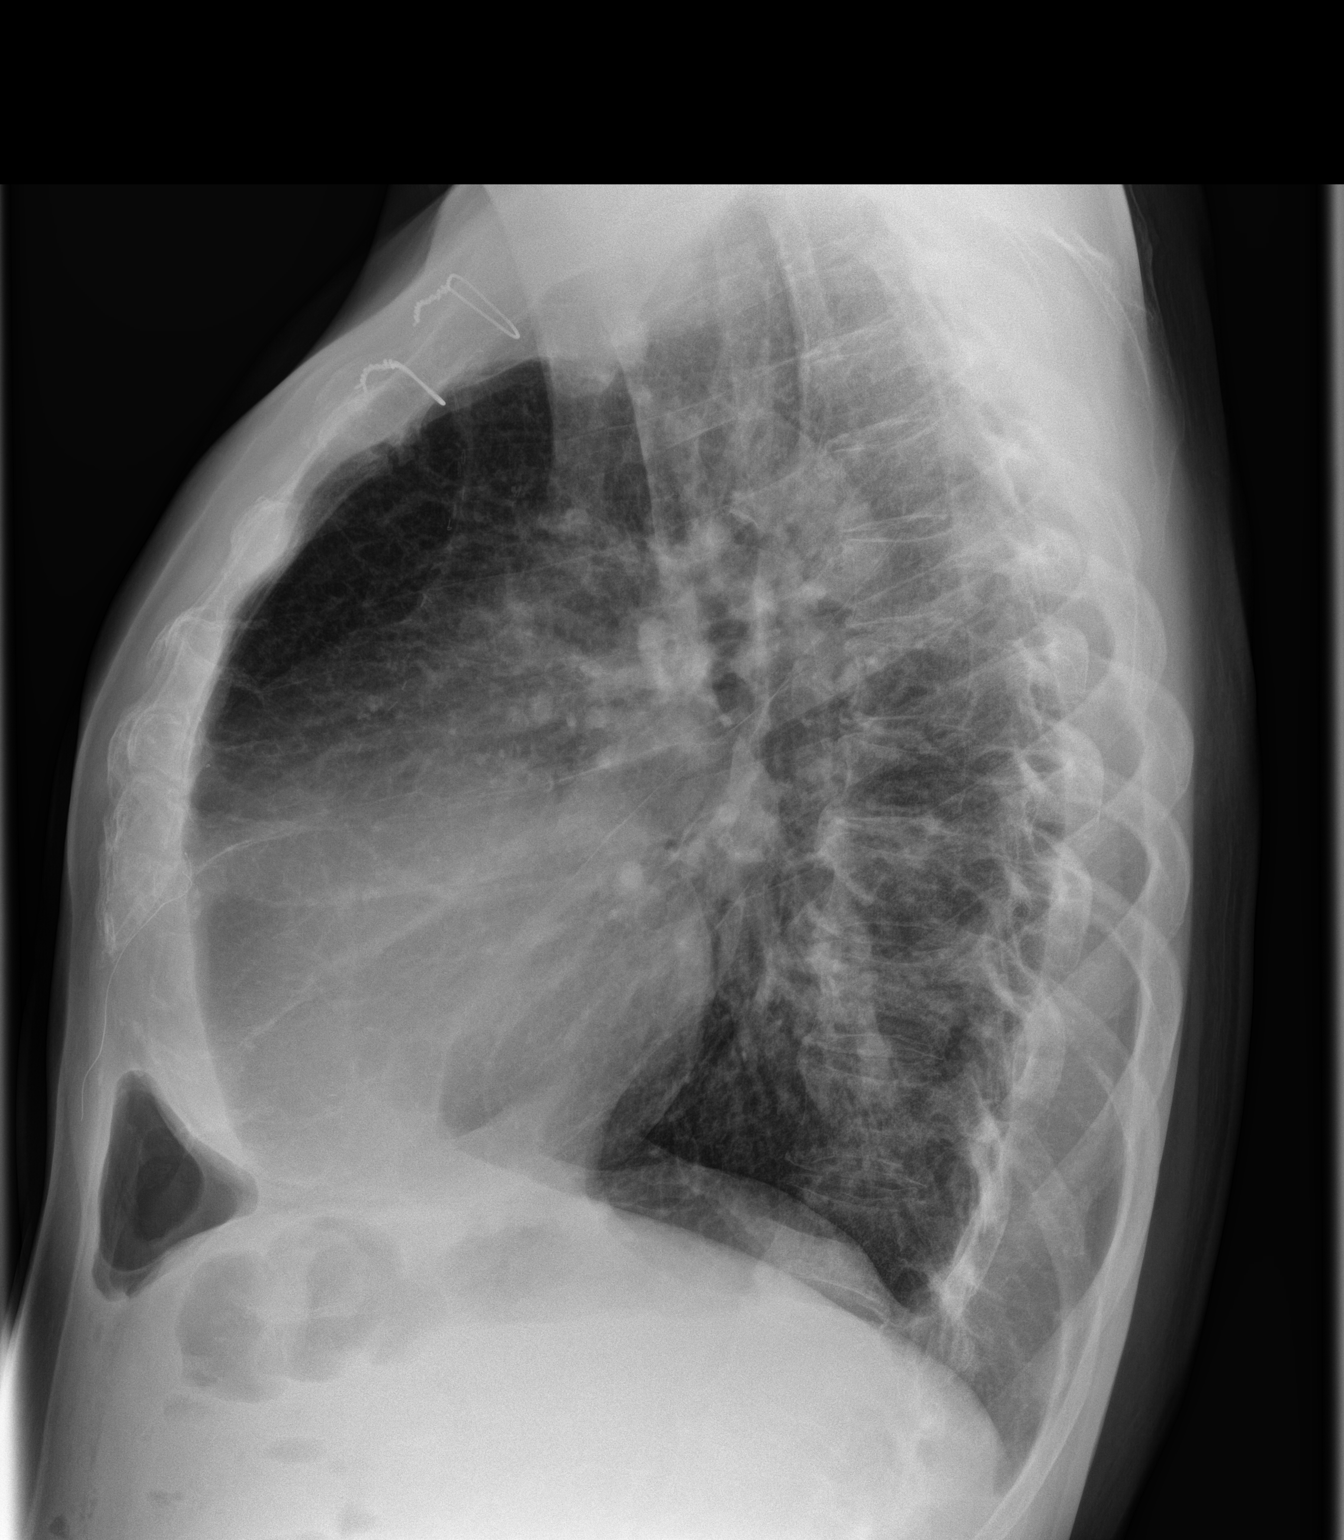

[2 of 2 positions shown; findings below may reference images not displayed]

FINDINGS: Cardiomegaly reidentified with evidence of median sternotomy. Mildly
prominent interstitial markings with Kerley B-lines peripherally are
again noted. No alveolar consolidation. Hyperinflation may suggest
emphysema. No pleural effusion.
IMPRESSION: Borderline interstitial edema superimposed on emphysema.

## 2015-03-13 ENCOUNTER — Encounter: Payer: Self-pay | Admitting: Internal Medicine

## 2015-03-15 ENCOUNTER — Encounter: Payer: Self-pay | Admitting: Cardiology

## 2015-03-17 ENCOUNTER — Ambulatory Visit (INDEPENDENT_AMBULATORY_CARE_PROVIDER_SITE_OTHER): Payer: Medicaid Other | Admitting: *Deleted

## 2015-03-17 DIAGNOSIS — Z5181 Encounter for therapeutic drug level monitoring: Secondary | ICD-10-CM

## 2015-03-17 DIAGNOSIS — I213 ST elevation (STEMI) myocardial infarction of unspecified site: Secondary | ICD-10-CM | POA: Diagnosis not present

## 2015-03-17 DIAGNOSIS — I513 Intracardiac thrombosis, not elsewhere classified: Secondary | ICD-10-CM

## 2015-03-17 LAB — POCT INR: INR: 2.5

## 2015-04-07 ENCOUNTER — Other Ambulatory Visit: Payer: Self-pay | Admitting: Internal Medicine

## 2015-04-07 ENCOUNTER — Other Ambulatory Visit: Payer: Self-pay | Admitting: *Deleted

## 2015-04-07 MED ORDER — WARFARIN SODIUM 5 MG PO TABS: ORAL_TABLET | ORAL | Status: DC

## 2015-04-07 NOTE — Telephone Encounter (Signed)
Request fulfilled-Please refer to Warfarin refill for details

## 2015-04-07 NOTE — Telephone Encounter (Signed)
Pt requesting a refill on Warfarin, please advise

## 2015-04-12 ENCOUNTER — Ambulatory Visit (INDEPENDENT_AMBULATORY_CARE_PROVIDER_SITE_OTHER): Payer: Medicaid Other | Admitting: *Deleted

## 2015-04-12 DIAGNOSIS — Z5181 Encounter for therapeutic drug level monitoring: Secondary | ICD-10-CM | POA: Diagnosis not present

## 2015-04-12 DIAGNOSIS — I213 ST elevation (STEMI) myocardial infarction of unspecified site: Secondary | ICD-10-CM | POA: Diagnosis not present

## 2015-04-12 DIAGNOSIS — I513 Intracardiac thrombosis, not elsewhere classified: Secondary | ICD-10-CM

## 2015-04-12 LAB — POCT INR: INR: 2.9

## 2015-04-24 IMAGING — CR DG CHEST 1V
1 series · 1 of 1 positions shown · non-contrast
Comparison: PA and lateral chest x-ray April 01, 2014

CLINICAL DATA: History of CHF, coronary artery disease, previous
bypass; smoker.

EXAM:
CHEST - 1 VIEW

[dxr chest 1 viewap or pa]
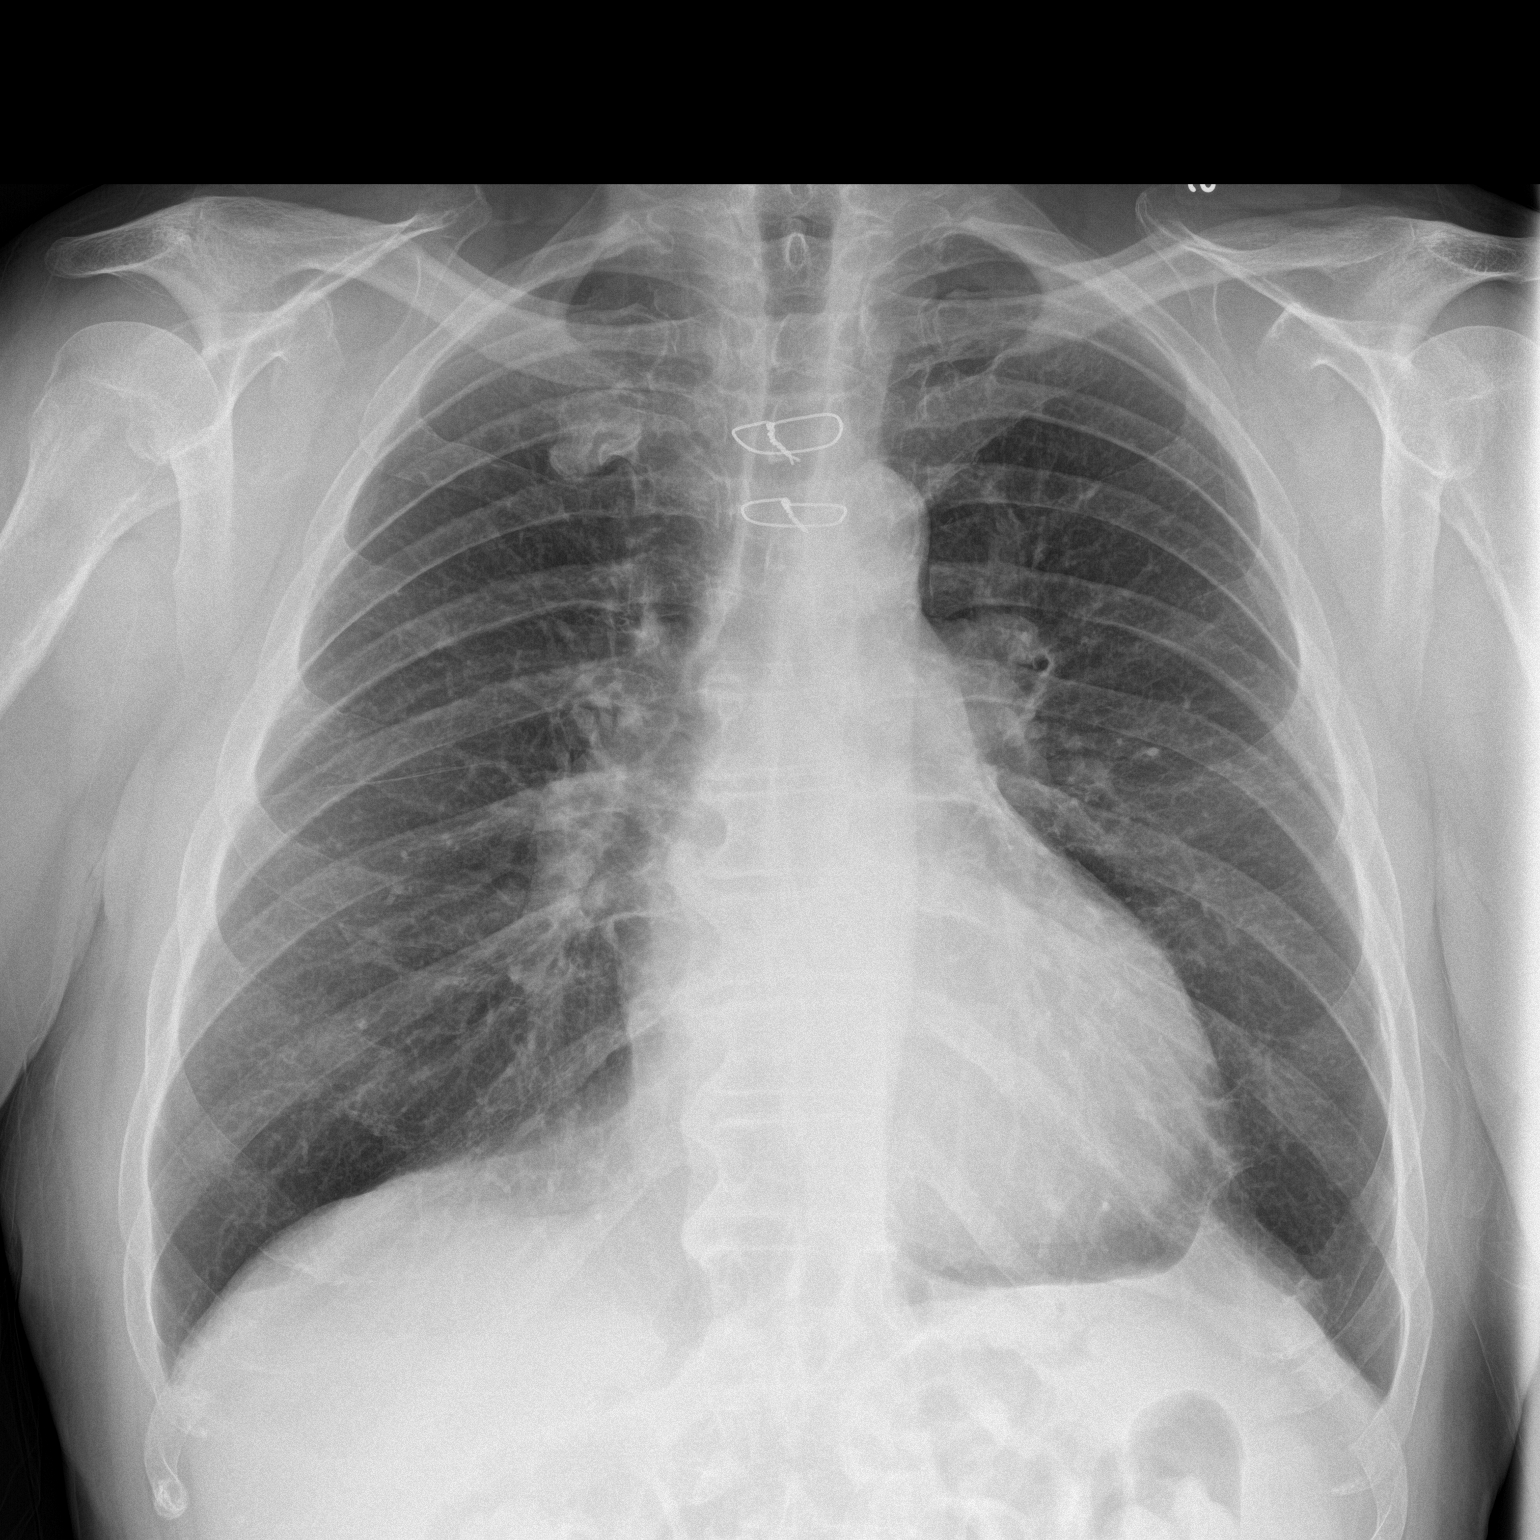

[1 of 1 positions shown; findings below may reference images not displayed]

FINDINGS: The lungs are mildly hyperinflated. There is no focal infiltrate.
The cardiac silhouette is normal in size. The pulmonary vascularity
is not engorged. There is no pleural effusion. The mediastinum is
normal in width. There are 2 sternal wires demonstrated. The bony
thorax is unremarkable.
IMPRESSION: COPD.  There is no active cardiopulmonary disease.

## 2015-05-03 ENCOUNTER — Other Ambulatory Visit (HOSPITAL_COMMUNITY): Payer: Self-pay | Admitting: *Deleted

## 2015-05-03 DIAGNOSIS — I5022 Chronic systolic (congestive) heart failure: Secondary | ICD-10-CM

## 2015-05-03 MED ORDER — LISINOPRIL 5 MG PO TABS: 5.0000 mg | ORAL_TABLET | Freq: Two times a day (BID) | ORAL | Status: DC

## 2015-05-25 ENCOUNTER — Ambulatory Visit (INDEPENDENT_AMBULATORY_CARE_PROVIDER_SITE_OTHER): Payer: Medicaid Other | Admitting: *Deleted

## 2015-05-25 DIAGNOSIS — I5022 Chronic systolic (congestive) heart failure: Secondary | ICD-10-CM | POA: Diagnosis not present

## 2015-05-25 DIAGNOSIS — I255 Ischemic cardiomyopathy: Secondary | ICD-10-CM

## 2015-05-26 ENCOUNTER — Other Ambulatory Visit: Payer: Self-pay | Admitting: *Deleted

## 2015-05-26 ENCOUNTER — Ambulatory Visit (INDEPENDENT_AMBULATORY_CARE_PROVIDER_SITE_OTHER): Payer: Medicaid Other | Admitting: *Deleted

## 2015-05-26 DIAGNOSIS — I5043 Acute on chronic combined systolic (congestive) and diastolic (congestive) heart failure: Secondary | ICD-10-CM

## 2015-05-26 DIAGNOSIS — I213 ST elevation (STEMI) myocardial infarction of unspecified site: Secondary | ICD-10-CM | POA: Diagnosis not present

## 2015-05-26 DIAGNOSIS — I513 Intracardiac thrombosis, not elsewhere classified: Secondary | ICD-10-CM

## 2015-05-26 DIAGNOSIS — Z5181 Encounter for therapeutic drug level monitoring: Secondary | ICD-10-CM

## 2015-05-26 LAB — POCT INR: INR: 2

## 2015-05-26 NOTE — Telephone Encounter (Signed)
Please review for refill, looks like these meds are followed in chf clinic. Thanks.

## 2015-05-26 NOTE — Telephone Encounter (Signed)
Please review for refill.  

## 2015-05-26 NOTE — Telephone Encounter (Signed)
Can these be refilled under Dr Ladona Ridgelaylor? Looks like these medications are followed by the chf clinic however I have sent this request twice today to the chf clinic today and it was denied both times. Please advise. Thanks, MI

## 2015-05-26 NOTE — Progress Notes (Signed)
Remote ICD transmission.   

## 2015-05-31 NOTE — Telephone Encounter (Signed)
Please advise 

## 2015-06-01 NOTE — Telephone Encounter (Signed)
Please advise on requested refills. It looks like they were ordered under Dr Ladona Ridgelaylor inadvertently at one point in time, but are actually managed in the chf clinic. Thanks, MI

## 2015-06-05 LAB — CUP PACEART REMOTE DEVICE CHECK
Battery Remaining Longevity: 88 mo
Battery Voltage: 2.99 V
Brady Statistic AP VP Percent: 24.61 %
HighPow Impedance: 74 Ohm
Implantable Lead Implant Date: 20160414
Implantable Lead Implant Date: 20160414
Implantable Lead Location: 753858
Implantable Lead Location: 753860
Implantable Lead Model: 4398
Implantable Lead Model: 5076
Lead Channel Impedance Value: 418 Ohm
Lead Channel Impedance Value: 456 Ohm
Lead Channel Impedance Value: 760 Ohm
Lead Channel Impedance Value: 760 Ohm
Lead Channel Impedance Value: 817 Ohm
Lead Channel Pacing Threshold Pulse Width: 0.4 ms
Lead Channel Sensing Intrinsic Amplitude: 0.75 mV
Lead Channel Sensing Intrinsic Amplitude: 0.75 mV
Lead Channel Setting Pacing Amplitude: 1.75 V
Lead Channel Setting Pacing Pulse Width: 0.03 ms
Lead Channel Setting Sensing Sensitivity: 0.3 mV
MDC IDC LEAD IMPLANT DT: 20160414
MDC IDC LEAD LOCATION: 753859
MDC IDC LEAD MODEL: 6935
MDC IDC MSMT LEADCHNL LV IMPEDANCE VALUE: 456 Ohm
MDC IDC MSMT LEADCHNL LV IMPEDANCE VALUE: 475 Ohm
MDC IDC MSMT LEADCHNL LV IMPEDANCE VALUE: 513 Ohm
MDC IDC MSMT LEADCHNL LV IMPEDANCE VALUE: 551 Ohm
MDC IDC MSMT LEADCHNL LV IMPEDANCE VALUE: 779 Ohm
MDC IDC MSMT LEADCHNL LV IMPEDANCE VALUE: 817 Ohm
MDC IDC MSMT LEADCHNL RA IMPEDANCE VALUE: 513 Ohm
MDC IDC MSMT LEADCHNL RA PACING THRESHOLD AMPLITUDE: 0.875 V
MDC IDC MSMT LEADCHNL RV IMPEDANCE VALUE: 342 Ohm
MDC IDC MSMT LEADCHNL RV SENSING INTR AMPL: 11 mV
MDC IDC MSMT LEADCHNL RV SENSING INTR AMPL: 11 mV
MDC IDC SESS DTM: 20170105062203
MDC IDC SET LEADCHNL LV PACING AMPLITUDE: 2.5 V
MDC IDC SET LEADCHNL LV PACING PULSEWIDTH: 0.8 ms
MDC IDC SET LEADCHNL RV PACING AMPLITUDE: 0.5 V
MDC IDC STAT BRADY AP VS PERCENT: 0.59 %
MDC IDC STAT BRADY AS VP PERCENT: 69.3 %
MDC IDC STAT BRADY AS VS PERCENT: 5.49 %
MDC IDC STAT BRADY RA PERCENT PACED: 25.2 %
MDC IDC STAT BRADY RV PERCENT PACED: 89.37 %

## 2015-06-05 NOTE — Telephone Encounter (Signed)
Please advise. Thanks, MI 

## 2015-06-06 ENCOUNTER — Other Ambulatory Visit: Payer: Self-pay | Admitting: Internal Medicine

## 2015-06-06 ENCOUNTER — Other Ambulatory Visit: Payer: Self-pay | Admitting: Physician Assistant

## 2015-06-06 ENCOUNTER — Other Ambulatory Visit: Payer: Self-pay | Admitting: Cardiology

## 2015-06-07 ENCOUNTER — Encounter: Payer: Self-pay | Admitting: Cardiology

## 2015-06-21 ENCOUNTER — Encounter: Payer: Self-pay | Admitting: Cardiology

## 2015-07-04 ENCOUNTER — Other Ambulatory Visit: Payer: Self-pay | Admitting: Internal Medicine

## 2015-07-07 ENCOUNTER — Ambulatory Visit (INDEPENDENT_AMBULATORY_CARE_PROVIDER_SITE_OTHER): Payer: Medicaid Other | Admitting: *Deleted

## 2015-07-07 DIAGNOSIS — Z5181 Encounter for therapeutic drug level monitoring: Secondary | ICD-10-CM

## 2015-07-07 DIAGNOSIS — I513 Intracardiac thrombosis, not elsewhere classified: Secondary | ICD-10-CM

## 2015-07-07 DIAGNOSIS — I213 ST elevation (STEMI) myocardial infarction of unspecified site: Secondary | ICD-10-CM

## 2015-07-07 LAB — POCT INR: INR: 2.3

## 2015-08-01 ENCOUNTER — Other Ambulatory Visit: Payer: Self-pay | Admitting: Cardiology

## 2015-08-01 ENCOUNTER — Other Ambulatory Visit: Payer: Self-pay | Admitting: Internal Medicine

## 2015-08-22 ENCOUNTER — Emergency Department (HOSPITAL_COMMUNITY): Payer: Medicaid Other

## 2015-08-22 ENCOUNTER — Observation Stay (HOSPITAL_COMMUNITY)
Admission: EM | Admit: 2015-08-22 | Discharge: 2015-08-23 | Disposition: A | Discharge status: Home / Self Care | Payer: Medicaid Other | Attending: Internal Medicine | Admitting: Internal Medicine

## 2015-08-22 ENCOUNTER — Ambulatory Visit (INDEPENDENT_AMBULATORY_CARE_PROVIDER_SITE_OTHER): Payer: Medicaid Other | Admitting: Pharmacist

## 2015-08-22 ENCOUNTER — Encounter (HOSPITAL_COMMUNITY): Payer: Self-pay | Admitting: Emergency Medicine

## 2015-08-22 DIAGNOSIS — Z5181 Encounter for therapeutic drug level monitoring: Secondary | ICD-10-CM | POA: Diagnosis not present

## 2015-08-22 DIAGNOSIS — Z95810 Presence of automatic (implantable) cardiac defibrillator: Secondary | ICD-10-CM

## 2015-08-22 DIAGNOSIS — I110 Hypertensive heart disease with heart failure: Secondary | ICD-10-CM | POA: Diagnosis not present

## 2015-08-22 DIAGNOSIS — T45515A Adverse effect of anticoagulants, initial encounter: Secondary | ICD-10-CM | POA: Insufficient documentation

## 2015-08-22 DIAGNOSIS — I4891 Unspecified atrial fibrillation: Secondary | ICD-10-CM

## 2015-08-22 DIAGNOSIS — I213 ST elevation (STEMI) myocardial infarction of unspecified site: Secondary | ICD-10-CM

## 2015-08-22 DIAGNOSIS — Z955 Presence of coronary angioplasty implant and graft: Secondary | ICD-10-CM | POA: Diagnosis not present

## 2015-08-22 DIAGNOSIS — Z950 Presence of cardiac pacemaker: Secondary | ICD-10-CM | POA: Insufficient documentation

## 2015-08-22 DIAGNOSIS — R791 Abnormal coagulation profile: Secondary | ICD-10-CM | POA: Diagnosis not present

## 2015-08-22 DIAGNOSIS — I255 Ischemic cardiomyopathy: Secondary | ICD-10-CM | POA: Diagnosis not present

## 2015-08-22 DIAGNOSIS — D6832 Hemorrhagic disorder due to extrinsic circulating anticoagulants: Secondary | ICD-10-CM | POA: Diagnosis present

## 2015-08-22 DIAGNOSIS — N179 Acute kidney failure, unspecified: Secondary | ICD-10-CM | POA: Insufficient documentation

## 2015-08-22 DIAGNOSIS — Z951 Presence of aortocoronary bypass graft: Secondary | ICD-10-CM

## 2015-08-22 DIAGNOSIS — I2510 Atherosclerotic heart disease of native coronary artery without angina pectoris: Secondary | ICD-10-CM | POA: Insufficient documentation

## 2015-08-22 DIAGNOSIS — F17200 Nicotine dependence, unspecified, uncomplicated: Secondary | ICD-10-CM | POA: Diagnosis not present

## 2015-08-22 DIAGNOSIS — Z7901 Long term (current) use of anticoagulants: Secondary | ICD-10-CM | POA: Diagnosis not present

## 2015-08-22 DIAGNOSIS — J189 Pneumonia, unspecified organism: Secondary | ICD-10-CM | POA: Diagnosis present

## 2015-08-22 DIAGNOSIS — I5022 Chronic systolic (congestive) heart failure: Secondary | ICD-10-CM | POA: Diagnosis present

## 2015-08-22 DIAGNOSIS — I513 Intracardiac thrombosis, not elsewhere classified: Secondary | ICD-10-CM

## 2015-08-22 HISTORY — DX: Pneumonia, unspecified organism: J18.9

## 2015-08-22 HISTORY — DX: Atherosclerotic heart disease of native coronary artery without angina pectoris: I25.10

## 2015-08-22 HISTORY — DX: Chronic atrial fibrillation, unspecified: I48.20

## 2015-08-22 LAB — BASIC METABOLIC PANEL
Anion gap: 13 (ref 5–15)
BUN: 32 mg/dL — ABNORMAL HIGH (ref 6–20)
CO2: 21 mmol/L — ABNORMAL LOW (ref 22–32)
Calcium: 9.2 mg/dL (ref 8.9–10.3)
Chloride: 101 mmol/L (ref 101–111)
Creatinine, Ser: 1.51 mg/dL — ABNORMAL HIGH (ref 0.61–1.24)
GFR calc Af Amer: 56 mL/min — ABNORMAL LOW (ref >60)
GFR calc non Af Amer: 49 mL/min — ABNORMAL LOW (ref >60)
Glucose, Bld: 104 mg/dL — ABNORMAL HIGH (ref 65–99)
Potassium: 4.7 mmol/L (ref 3.5–5.1)
Sodium: 135 mmol/L (ref 135–145)

## 2015-08-22 LAB — URINALYSIS, ROUTINE W REFLEX MICROSCOPIC
Glucose, UA: NEGATIVE mg/dL
Hgb urine dipstick: NEGATIVE
Ketones, ur: 15 mg/dL — AB
Leukocytes, UA: NEGATIVE
Nitrite: NEGATIVE
Protein, ur: NEGATIVE mg/dL
Specific Gravity, Urine: 1.023 (ref 1.005–1.030)
pH: 5 (ref 5.0–8.0)

## 2015-08-22 LAB — I-STAT CHEM 8, ED
BUN: 35 mg/dL — ABNORMAL HIGH (ref 6–20)
Calcium, Ion: 1.11 mmol/L — ABNORMAL LOW (ref 1.13–1.30)
Chloride: 99 mmol/L — ABNORMAL LOW (ref 101–111)
Creatinine, Ser: 1.3 mg/dL — ABNORMAL HIGH (ref 0.61–1.24)
Glucose, Bld: 104 mg/dL — ABNORMAL HIGH (ref 65–99)
HCT: 44 % (ref 39.0–52.0)
Hemoglobin: 15 g/dL (ref 13.0–17.0)
Potassium: 4.5 mmol/L (ref 3.5–5.1)
Sodium: 134 mmol/L — ABNORMAL LOW (ref 135–145)
TCO2: 23 mmol/L (ref 0–100)

## 2015-08-22 LAB — CBC WITH DIFFERENTIAL/PLATELET
Basophils Absolute: 0 10*3/uL (ref 0.0–0.1)
Basophils Relative: 0 %
Eosinophils Absolute: 0.1 10*3/uL (ref 0.0–0.7)
Eosinophils Relative: 1 %
HCT: 39.9 % (ref 39.0–52.0)
Hemoglobin: 13.9 g/dL (ref 13.0–17.0)
Lymphocytes Relative: 12 %
Lymphs Abs: 1.6 10*3/uL (ref 0.7–4.0)
MCH: 30.9 pg (ref 26.0–34.0)
MCHC: 34.8 g/dL (ref 30.0–36.0)
MCV: 88.7 fL (ref 78.0–100.0)
Monocytes Absolute: 1.1 10*3/uL — ABNORMAL HIGH (ref 0.1–1.0)
Monocytes Relative: 9 %
Neutro Abs: 10 10*3/uL — ABNORMAL HIGH (ref 1.7–7.7)
Neutrophils Relative %: 78 %
Platelets: 271 10*3/uL (ref 150–400)
RBC: 4.5 MIL/uL (ref 4.22–5.81)
RDW: 13.6 % (ref 11.5–15.5)
WBC: 12.8 10*3/uL — ABNORMAL HIGH (ref 4.0–10.5)

## 2015-08-22 LAB — I-STAT CG4 LACTIC ACID, ED: Lactic Acid, Venous: 1.74 mmol/L (ref 0.5–2.0)

## 2015-08-22 LAB — INFLUENZA PANEL BY PCR (TYPE A & B)
H1N1 flu by pcr: NOT DETECTED
INFLBPCR: NEGATIVE
Influenza A By PCR: NEGATIVE

## 2015-08-22 LAB — PROTIME-INR
INR: 9.14 — AB (ref 0.00–1.49)
INR: 9.59 (ref 0.00–1.49)
PROTHROMBIN TIME: 70.7 s — AB (ref 11.6–15.2)
Prothrombin Time: 73.3 seconds — ABNORMAL HIGH (ref 11.6–15.2)

## 2015-08-22 LAB — STREP PNEUMONIAE URINARY ANTIGEN: Strep Pneumo Urinary Antigen: NEGATIVE

## 2015-08-22 LAB — POCT INR: INR: >8

## 2015-08-22 LAB — PROCALCITONIN: Procalcitonin: <0.1 ng/mL

## 2015-08-22 MED ORDER — IPRATROPIUM-ALBUTEROL 0.5-2.5 (3) MG/3ML IN SOLN
3.0000 mL | Freq: Once | RESPIRATORY_TRACT | Status: AC
Start: 2015-08-22 — End: 2015-08-22
  Administered 2015-08-22: 3 mL via RESPIRATORY_TRACT
  Filled 2015-08-22: qty 3

## 2015-08-22 MED ORDER — ATORVASTATIN CALCIUM 40 MG PO TABS
40.0000 mg | ORAL_TABLET | Freq: Every day | ORAL | Status: DC
  Administered 2015-08-22: 40 mg via ORAL
  Filled 2015-08-22: qty 1

## 2015-08-22 MED ORDER — ONDANSETRON HCL 4 MG/2ML IJ SOLN: 4.0000 mg | Freq: Four times a day (QID) | INTRAMUSCULAR | Status: DC | PRN

## 2015-08-22 MED ORDER — DEXTROSE 5 % IV SOLN
1.0000 g | Freq: Once | INTRAVENOUS | Status: AC
  Administered 2015-08-22: 1 g via INTRAVENOUS
  Filled 2015-08-22: qty 10

## 2015-08-22 MED ORDER — GUAIFENESIN 100 MG/5ML PO SOLN
5.0000 mL | ORAL | Status: DC | PRN
  Administered 2015-08-23: 100 mg via ORAL
  Filled 2015-08-22: qty 5

## 2015-08-22 MED ORDER — IPRATROPIUM-ALBUTEROL 0.5-2.5 (3) MG/3ML IN SOLN: 3.0000 mL | RESPIRATORY_TRACT | Status: DC | PRN

## 2015-08-22 MED ORDER — SODIUM CHLORIDE 0.9 % IV SOLN: INTRAVENOUS | Status: DC

## 2015-08-22 MED ORDER — DEXTROSE 5 % IV SOLN
500.0000 mg | INTRAVENOUS | Status: DC
  Filled 2015-08-22: qty 500

## 2015-08-22 MED ORDER — SODIUM CHLORIDE 0.9 % IV BOLUS (SEPSIS)
1000.0000 mL | Freq: Once | INTRAVENOUS | Status: AC
  Administered 2015-08-22: 1000 mL via INTRAVENOUS

## 2015-08-22 MED ORDER — SODIUM CHLORIDE 0.9 % IV BOLUS (SEPSIS)
500.0000 mL | Freq: Once | INTRAVENOUS | Status: AC
  Administered 2015-08-22: 500 mL via INTRAVENOUS

## 2015-08-22 MED ORDER — SODIUM CHLORIDE 0.9 % IV SOLN
INTRAVENOUS | Status: DC
  Administered 2015-08-22: 15:00:00 via INTRAVENOUS

## 2015-08-22 MED ORDER — SODIUM CHLORIDE 0.9 % IV SOLN
INTRAVENOUS | Status: DC
  Administered 2015-08-22: 16:00:00 via INTRAVENOUS

## 2015-08-22 MED ORDER — ONDANSETRON HCL 4 MG PO TABS: 4.0000 mg | ORAL_TABLET | Freq: Four times a day (QID) | ORAL | Status: DC | PRN

## 2015-08-22 MED ORDER — CEFTRIAXONE SODIUM 1 G IJ SOLR
1.0000 g | INTRAMUSCULAR | Status: DC
  Filled 2015-08-22: qty 10

## 2015-08-22 MED ORDER — DEXTROSE 5 % IV SOLN
500.0000 mg | Freq: Once | INTRAVENOUS | Status: AC
  Administered 2015-08-22: 500 mg via INTRAVENOUS
  Filled 2015-08-22: qty 500

## 2015-08-22 MED ORDER — PHYTONADIONE 5 MG PO TABS
2.5000 mg | ORAL_TABLET | Freq: Once | ORAL | Status: AC
  Administered 2015-08-22: 2.5 mg via ORAL
  Filled 2015-08-22: qty 1

## 2015-08-22 MED ORDER — DEXTROSE 5 % IV SOLN: 1.0000 g | INTRAVENOUS | Status: DC

## 2015-08-22 MED ORDER — DEXTROSE 5 % IV SOLN: 500.0000 mg | INTRAVENOUS | Status: DC

## 2015-08-22 MED ORDER — WARFARIN - PHARMACIST DOSING INPATIENT: Freq: Every day | Status: DC

## 2015-08-22 NOTE — ED Notes (Signed)
Dr Yetta BarreJones MD at bedside.

## 2015-08-22 NOTE — ED Notes (Signed)
Attempted to call report

## 2015-08-22 NOTE — ED Notes (Signed)
Dr. Yetta BarreJones, admitting, made aware of INR

## 2015-08-22 NOTE — H&P (Signed)
Date: 08/22/2015               Patient Name:  Joseph Carson MRN: [id number]  DOB: 1956/05/11 Age / Sex: 60 y.o., male   PCP: No primary care provider on file.         Medical Service: Internal Medicine Teaching Service         Attending Physician: Dr. Raeford RazorStephen Kohut, MD    First Contact: Smitty KnudsenSarah Street, MS4 Pager: [phone]  Second Contact: Dr. Beckie Saltsivet Pager: [phone]       After Hours (After 5p/  First Contact Pager: [phone]  weekends / holidays): Second Contact Pager: [phone]   Chief Complaint: Cough  History of Present Illness: [name] is a 60 y.o. male w/ PMHx of CAD s/p CABG, systolic CHF w/ EF of 20% w/ AICD, atrial fibrillation on Coumadin, HTN, presents to the ED w/ complaints of productive cough and subjective fevers. Patient states he has had these symptoms for the past 7 days and things are not improving. He also admits to recent SOB, abdominal discomfort and diarrhea. He does admit to recent sick contacts (children). Patient denies dizziness, lightheadedness, vomiting, chest pain, PND, or orthopnea. Patient also states he has had very little po intake for the past week or so as well due to lack of appetite. He went to the Coumadin clinic today and was told his INR was 8.   In the ED, patient was noted to be with mildly decreased SpO2 to 94%, CXR with scattered infiltrates. No fever on exam, mild leukocytosis, normal lactic acid. Also with mild AKI.   Patient smokes and has for many many years, does not drink, no recreational drug use.    Meds: Current Facility-Administered Medications  Medication Dose Route Frequency Provider Last Rate Last Dose  . 0.9 %  sodium chloride infusion   Intravenous Continuous Raeford RazorStephen Kohut, MD      . azithromycin [facility](ZITHROMAX) 500 mg in dextrose 5 % 250 mL IVPB  500 mg Intravenous Once Raeford RazorStephen Kohut, MD      . cefTRIAXone (ROCEPHIN) 1 g in dextrose 5 % 50 mL IVPB  1 g Intravenous Once Raeford RazorStephen Kohut, MD       Current  Outpatient Prescriptions  Medication Sig Dispense Refill  . acetaminophen (TYLENOL) 325 MG tablet Take 650 mg by mouth every 6 (six) hours as needed for mild pain.    Marland Kitchen. atorvastatin (LIPITOR) 40 MG tablet Take 40 mg by mouth daily.    . Biotin w/ Vitamins C & E (HAIR/SKIN/NAILS PO) Take 1 tablet by mouth daily.    . carvedilol (COREG) 6.25 MG tablet Take 6.25 mg by mouth 2 (two) times daily with a meal.    . furosemide (LASIX) 40 MG tablet Take 40 mg by mouth daily.    Marland Kitchen. lisinopril (PRINIVIL,ZESTRIL) 5 MG tablet Take 5 mg by mouth 2 (two) times daily.    . Pseudoephedrine-APAP-DM (DAYQUIL PO) Take 15 mLs by mouth every 8 (eight) hours as needed (body pains).    Marland Kitchen. spironolactone (ALDACTONE) 25 MG tablet Take 25 mg by mouth daily.    Marland Kitchen. warfarin (COUMADIN) 5 MG tablet Take 5 mg by mouth See admin instructions. Pt takes 5mg  everyday except sundays - pt takes 7.5mg  Sundays      Allergies: Allergies as of 08/22/2015  . (No Known Allergies)   Past Medical History  Diagnosis Date  . Pacemaker   . Coronary artery disease    History reviewed. No pertinent  past surgical history. History reviewed. No pertinent family history. Social History   Social History  . Marital Status: Single    Spouse Name: N/A  . Number of Children: N/A  . Years of Education: N/A   Occupational History  . Not on file.   Social History Main Topics  . Smoking status: Current Every Day Smoker  . Smokeless tobacco: Not on file  . Alcohol Use: No  . Drug Use: No  . Sexual Activity: Not on file   Other Topics Concern  . Not on file   Social History Narrative  . No narrative on file    Review of Systems: General: Positive for fatigue, subjective fever, and decreased appetite. Denies diaphoresis.  Respiratory: Positive for SOB, cough, Denies wheezing.   Cardiovascular: Denies chest pain and palpitations.  Gastrointestinal: Positive for nausea, abdominal pain, and diarrhea. Denies vomiting, constipation,  blood in stool and abdominal distention.  Genitourinary: Denies dysuria, urgency, frequency, hematuria, and flank pain. Endocrine: Denies hot or cold intolerance, polyuria, and polydipsia. Musculoskeletal: Denies myalgias, back pain, joint swelling, arthralgias and gait problem.  Skin: Denies pallor, rash and wounds.  Neurological: Denies dizziness, seizures, syncope, weakness, lightheadedness, numbness and headaches.  Psychiatric/Behavioral: Denies mood changes, confusion, nervousness, sleep disturbance and agitation.   Physical Exam: Blood pressure 109/67, pulse 65, temperature 97.8 F (36.6 C), temperature source Oral, resp. rate 18, SpO2 93 %.  General: White male, alert, cooperative, NAD. HEENT: PERRL, EOMI. Moist mucus membranes Neck: Full range of motion without pain, supple, no lymphadenopathy or carotid bruits Lungs: Air entry equal bilaterally, rhonchi and wheezes throughout. No rales.  Heart: RRR, no murmurs, gallops, or rubs Abdomen: Soft, non-tender, non-distended, BS + Extremities: No cyanosis, clubbing, or edema Neurologic: Alert & oriented x3, cranial nerves II-XII intact, strength grossly intact, sensation intact to light touch   Lab results: Basic Metabolic Panel:  Recent Labs  16/10/96 1214 08/22/15 1215  NA 135 134*  K 4.7 4.5  CL 101 99*  CO2 21*  --   GLUCOSE 104* 104*  BUN 32* 35*  CREATININE 1.51* 1.30*  CALCIUM 9.2  --    CBC:  Recent Labs  08/22/15 1214 08/22/15 1215  WBC 12.8*  --   NEUTROABS 10.0*  --   HGB 13.9 15.0  HCT 39.9 44.0  MCV 88.7  --   PLT 271  --    Urinalysis:  Recent Labs  08/22/15 1422  COLORURINE AMBER*  LABSPEC 1.023  PHURINE 5.0  GLUCOSEU NEGATIVE  HGBUR NEGATIVE  BILIRUBINUR SMALL*  KETONESUR 15*  PROTEINUR NEGATIVE  NITRITE NEGATIVE  LEUKOCYTESUR NEGATIVE    Imaging results:  Dg Chest 2 View  08/22/2015  CLINICAL DATA:  Short of breath for 2 weeks. Cardiac surgery 3 years ago. EXAM: CHEST  2 VIEW  COMPARISON:  None. FINDINGS: Normal heart size. Left subclavian AICD device. Ill-defined patchy and nodular densities in the lingula. Lungs otherwise clear. No pneumothorax. IMPRESSION: Patchy and nodular opacities are present in the lingula. Followup PA and lateral chest X-ray is recommended in 3-4 weeks following trial of antibiotic therapy to ensure resolution and exclude underlying malignancy. Electronically Signed   By: Jolaine Click M.D.   On: 08/22/2015 12:51    Other results: EKG: V-paced  Assessment & Plan by Problem:  60 y.o. male w/ PMHx of CAD s/p CABG (several years ago), systolic CHF w/ EF of 20% w/ AICD, atrial fibrillation on Coumadin, HTN, admitted for CAP.   Community Acquired Pneumonia: Patient with  SOB, subjective fevers, chills, productive cough, abdominal discomfort, mild nausea, and diarrhea. Has had sick contacts. Symptoms present for the past 1 week. On exam, mildly hypotensive (baseline low BP), leukocytosis, no fever. CXR significant for patchy infiltrates. Lungs with scattered wheezes and rhonchi. Looks comfortable on exam, SpO2 94% on RA. Started on Rocephin and Azithromycin in the ED. Given NS, total of 1.5L. Flu negative.  -Admit to med-surg -Continue Rocephin + Azithro -Urine legionella, strep pneumo Ag -PCT; suspect this is likely a viral etiology given GI symptoms -Given NS in ED, hold for now given low EF (20%) -Duonebs q4h prn -Given long h/o smoking and wheezes on exam, will likely need pulmonary outpatient  Atrial Fibrillation with Supratherapeutic INR: On Coumadin. INR 9.4 today, likely from limited po intake given recent illness. No evidence of bleeding. Does have old ecchymosis on left upper abdomen. No epistaxis, hematuria, or melena.  -Will give 2.5 mg po Vitamin K.  -Hold Coumadin. Consult pharmacy to follow -Repeat INR in AM  AKI: Pre-renal, related to illness. Poor po intake, low BP. Diarrhea.  -Holding medications as below.  -IVF as mentioned.  Caution given low EF  CHF: Patient with ischemic cardiomyopathy (s/p CABG x3), EF 20%. Has another chart that needs to be merged. Follows with Dr. Shirlee Latch and Ladona Ridgel. ICD in place.  -Holding Coreg, ACEI, Spironolactone, and Lasix for now given BP in the 70-80's in the ED. Can likely restart in the AM.  -Continue Lipitor  DVT/PE PPx: Coumadin as above  Dispo: Disposition is deferred at this time, awaiting improvement of current medical problems. Anticipated discharge in approximately 1-2 day(s).   The patient does not have a current PCP (No primary care provider on file.) and does need an [facility] hospital follow-up appointment after discharge.  The patient does not have transportation limitations that hinder transportation to clinic appointments.  Signed: Courtney Paris, MD 08/22/2015, 3:05 PM

## 2015-08-22 NOTE — H&P (Signed)
Date: 08/22/2015               Patient Name:  Joseph Carson MRN: [id number]  DOB: June 16, 1955 Age / Sex: 60 y.o., male   PCP: No primary care provider on file.              Medical Service: Internal Medicine Teaching Service              Attending Physician: Dr. Rogelia Boga    First Contact: Smitty Knudsen, MS4 Pager: [phone]  Second Contact: Dr. Beckie Salts Pager: [phone]  Third Contact Dr.  Pager: [phone]       After Hours (After 5p/  First Contact Pager: [phone]  weekends / holidays): Second Contact Pager: [phone]   Chief Complaint: Productive cough  History of Present Illness: Joseph Carson is a 60 y.o man with a history of a-fib, CHF, s/p CABG~2013, who presents with a 5 day history of productive cough, shortness of breath, diarrhea and intermittent low-grade fever.  He also had an INR reading of 8 at the coumadin clinic on 4/3.  With regard to the cough, it usually produces white sputum and is worse at night.  He has coughed so much that his diaphragm and ribs are sore from all the coughing, but he denies any other chest pain other than what he feels when he coughs.  His grandchildren were sick a couple of weeks ago, and then he started feeling sick with an illness similar to theirs 5 days ago. The kids eventually got better, but his cough has persisted leading him to come to the hospital today.  He has had intermittent low-grade fevers that range from 101 to 103.   The diarrhea started about 3 days ago, and is completely liquid, and occurs about two times a day.  He has not had much appetite but he has been able to drink fluids.  He also has felt so badly that he has not wanted to smoke cigarettes at all this week.  He denies hemoptysis, dyspnea at rest, orthopnea, weight gain, edema, n/v, headaches, or epistaxis.    With regard to his supratherapeutic warfarin, he did not take his dose today.  He has not ever had a problem with this before, and attributes his INR of 8 to his decreased PO  intake over the past week.    In the ED his INR was 9 and a chest X-ray showed ill-defined patchy infiltrates and nodular densities in the lingula.  He was started on IV azithromycin and ceftriaxone and given a 500 mL fluid bolus.  He also received 1 dose of Duoneb.     Meds: Current Facility-Administered Medications  Medication Dose Route Frequency Provider Last Rate Last Dose  . 0.9 %  sodium chloride infusion   Intravenous Continuous Raeford Razor, MD      . azithromycin [facility]) 500 mg in dextrose 5 % 250 mL IVPB  500 mg Intravenous Once Raeford Razor, MD      . cefTRIAXone (ROCEPHIN) 1 g in dextrose 5 % 50 mL IVPB  1 g Intravenous Once Raeford Razor, MD       Current Outpatient Prescriptions  Medication Sig Dispense Refill  . acetaminophen (TYLENOL) 325 MG tablet Take 650 mg by mouth every 6 (six) hours as needed for mild pain.    Marland Kitchen atorvastatin (LIPITOR) 40 MG tablet Take 40 mg by mouth daily.    . Biotin w/ Vitamins C & E (HAIR/SKIN/NAILS PO) Take 1 tablet by mouth daily.    Marland Kitchen  carvedilol (COREG) 6.25 MG tablet Take 6.25 mg by mouth 2 (two) times daily with a meal.    . furosemide (LASIX) 40 MG tablet Take 40 mg by mouth daily.    Marland Kitchen. lisinopril (PRINIVIL,ZESTRIL) 5 MG tablet Take 5 mg by mouth 2 (two) times daily.    . Pseudoephedrine-APAP-DM (DAYQUIL PO) Take 15 mLs by mouth every 8 (eight) hours as needed (body pains).    Marland Kitchen. spironolactone (ALDACTONE) 25 MG tablet Take 25 mg by mouth daily.    Marland Kitchen. warfarin (COUMADIN) 5 MG tablet Take 5 mg by mouth See admin instructions. Pt takes 5mg  everyday except sundays - pt takes 7.5mg  Sundays      Allergies: Allergies as of 08/22/2015  . (No Known Allergies)   Past Medical History  Diagnosis Date  . Pacemaker   . Coronary artery disease    History reviewed. No pertinent past surgical history. History reviewed. No pertinent family history. Social History   Social History  . Marital Status: Single    Spouse Name: N/A  . Number of  Children: N/A  . Years of Education: N/A   Occupational History  . Not on file.   Social History Main Topics  . Smoking status: Current Every Day Smoker  . Smokeless tobacco: Not on file  . Alcohol Use: No  . Drug Use: No  . Sexual Activity: Not on file   Other Topics Concern  . Not on file   Social History Narrative  . No narrative on file    Review of Systems: Pertinent items noted in HPI and remainder of comprehensive ROS otherwise negative.  Physical Exam: Blood pressure 109/67, pulse 65, temperature 97.8 F (36.6 C), temperature source Oral, resp. rate 18, SpO2 93 %. General appearance: alert, cooperative, no distress and moderately obese Head: Normocephalic, without obvious abnormality Neck: no adenopathy and supple, symmetrical, trachea midline Lungs: rhonchi diffuse and wheezes diffusely throughout Chest wall: no tenderness Heart: regular rate and rhythm, S1, S2 normal, no murmur, click, rub or gallop and no LE edema Abdomen: soft, non-tender; bowel sounds normal; no masses,  no organomegaly and large contusion over the RUQ Pulses: 2+ and symmetric Skin: Skin color, texture, turgor normal. No rashes or lesions or no petichiae, bleeding or bruises other than abdominal bruise Lymph nodes: Cervical, supraclavicular, and axillary nodes normal.  Lab results:  @LABTEST @  Imaging results:  Dg Chest 2 View  08/22/2015  CLINICAL DATA:  Short of breath for 2 weeks. Cardiac surgery 3 years ago. EXAM: CHEST  2 VIEW COMPARISON:  None. FINDINGS: Normal heart size. Left subclavian AICD device. Ill-defined patchy and nodular densities in the lingula. Lungs otherwise clear. No pneumothorax. IMPRESSION: Patchy and nodular opacities are present in the lingula. Followup PA and lateral chest X-ray is recommended in 3-4 weeks following trial of antibiotic therapy to ensure resolution and exclude underlying malignancy. Electronically Signed   By: Jolaine ClickArthur  Hoss M.D.   On: 08/22/2015 12:51     Other results:   Assessment & Plan by Problem: Principal Problem:   CAP (community acquired pneumonia) Active Problems:   Warfarin-induced coagulopathy (HCC)   Heart failure, chronic systolic [facility](HCC)  Mr. Allison Quarryrendt is a 60 y.o. Man with a history of a-fib, CHF, s/p CABG 2013, who presents in stable condition with productive cough, dyspnea on exertion, diffuse rales and wheezes, and patchy pulmonary infiltrates in the setting of sick contacts, consistent with community acquired pneumonia.    Community Acquired Pneumonia Pt complains of 5-day history of productive  cough, diarrhea, shortness of breath and fatigue.  Lung exam is significant for diffuse rales and wheezes. Vitals significant for mild hypotension, likely due to decreased PO intake.  Currently afebrile.  Given known sick contacts and lack of high fever and consolidation on CXR, this is likely a viral etiology, but we cannot rule out atypical bacterial causes of this community acquired pneumonia.  Will admit to observation unit and will start IV empiric antibiotics and fluids.  Likely will be discharged tomorrow.   --Received IV fluid bolus in ED (1500 mL).  Will continue MIVF, NS at 100 ml/h for 24 hours. --Received 1 g IV ceftriaxone and 500 mg IV azithromycin in ED --Schedule ceftriaxone 1g, IV for 4/4 at 1400 --Schedule azithromycin 500 mg, IV for 4/4 at 1400 --Check legionella and strep pneumo urinary antigen --Influenza panel --Check CBC and BMET in AM --Droplet precautions --Oxygen via [place] if O2 sat <92%  Supratherapeutic warfarin Patient reports having an INR of 8 yesterday in coumadin clinic.  Attributes to decreased PO intake.  Denies any current bleeding or bruising.  INR in ED was 9. --Hold warfarin until INR is therapeutic   --Pharmacy consult --Vitamin K, 2.5 mg, daily --Recheck INR in AM --Continue to check for signs of bleeding  History of CHF Patient currently denies weight gain, edema, or orthopnea.  Exam is  not concerning for volume overload.   --Continue home atorvastatin 40 mg daily --Follow daily weights, I/O  FEN/GI -Heart healthy diet -MIVF NS 100 ml/h for 24 hours  Anticoagulation: Not indicated at this time   This is a Psychologist, occupational Note.  The care of the patient was discussed with Dr. Ilsa Iha and the assessment and plan was formulated with their assistance.  Please see their note for official documentation of the patient encounter.   Signed: Dayton Scrape Reymond Maynez, Med Student 08/22/2015, 3:09 PM

## 2015-08-22 NOTE — ED Notes (Signed)
Pt sts fever, cough, and diarrhea x 1 week; pt sts generalized weakness and be hypotensive

## 2015-08-22 NOTE — ED Notes (Signed)
Patient given a mask to wear. 

## 2015-08-22 NOTE — ED Provider Notes (Signed)
CSN: [id number]     Arrival date & time 08/22/15  1053 History   First MD Initiated Contact with Patient 08/22/15 1110     Chief Complaint  Patient presents with  . Fever  . Cough  . Hypotension     (Consider location/radiation/quality/duration/timing/severity/associated sxs/prior Treatment) HPI   60yM with generalized fatigue. Onset about a week ago. Progressively worsening. Associated with nonbloody diarrhea. No urinary complaints. Cough productive for whitish sputum. Upper abdominal soreness with coughing. Mild dyspnea. Subjective fever. Multiple grandchildren with recent respiratory symptoms.   Past Medical History  Diagnosis Date  . Pacemaker   . Coronary artery disease    History reviewed. No pertinent past surgical history. History reviewed. No pertinent family history. Social History  Substance Use Topics  . Smoking status: Current Every Day Smoker  . Smokeless tobacco: None  . Alcohol Use: No    Review of Systems  All systems reviewed and negative, other than as noted in HPI.   Allergies  Review of patient's allergies indicates no known allergies.  Home Medications   Prior to Admission medications   Not on File   BP 81/57 mmHg  Pulse 73  Temp(Src) 97.8 F (36.6 C) (Oral)  Resp 18  SpO2 95% Physical Exam  Constitutional: He appears well-developed and well-nourished. No distress.  HENT:  Head: Normocephalic and atraumatic.  Eyes: Conjunctivae are normal. Right eye exhibits no discharge. Left eye exhibits no discharge.  Neck: Neck supple.  Cardiovascular: Normal rate, regular rhythm and normal heart sounds.  Exam reveals no gallop and no friction rub.   No murmur heard. Pulmonary/Chest: Effort normal. He has no wheezes. He exhibits no tenderness.  Abdominal: Soft. He exhibits no distension. There is no tenderness.  Musculoskeletal: He exhibits edema. He exhibits no tenderness.  Mild LE edema, R>L which pt reports is chronic  Neurological: He is alert.   Skin: Skin is warm and dry.  Psychiatric: He has a normal mood and affect. His behavior is normal. Thought content normal.  Nursing note and vitals reviewed.   ED Course  Procedures (including critical care time) Labs Review Labs Reviewed - No data to display  Imaging Review Dg Chest 2 View  08/22/2015  CLINICAL DATA:  Short of breath for 2 weeks. Cardiac surgery 3 years ago. EXAM: CHEST  2 VIEW COMPARISON:  None. FINDINGS: Normal heart size. Left subclavian AICD device. Ill-defined patchy and nodular densities in the lingula. Lungs otherwise clear. No pneumothorax. IMPRESSION: Patchy and nodular opacities are present in the lingula. Followup PA and lateral chest X-ray is recommended in 3-4 weeks following trial of antibiotic therapy to ensure resolution and exclude underlying malignancy. Electronically Signed   By: Jolaine Click M.D.   On: 08/22/2015 12:51   I have personally reviewed and evaluated these images and lab results as part of my medical decision-making.   EKG Interpretation   Date/Time:  Tuesday August 22 2015 11:45:06 EDT Ventricular Rate:  65 PR Interval:  240 QRS Duration: 141 QT Interval:  477 QTC Calculation: 496 R Axis:   14 Text Interpretation:  Ventricular-paced rhythm No further analysis  attempted due to paced rhythm ED PHYSICIAN INTERPRETATION AVAILABLE IN  [facility] Confirmed by TEST, Record ([id number]) on 08/23/2015 6:56:46 AM      MDM   Final diagnoses:  CAP (community acquired pneumonia)    60yM with cough, dyspnea, subjective fever and generalized weakness. All likely 2/2 CAP. Hypotensive on arrival, but doesn't appear toxic. Alert and appropriate. BP responding  to IVF. Rocephin/azithromycin ordered.  While in ED he reports coumadin clinic called him to let him know that his INR 8 or 9. No overt bleeding. H/H fine. Will repeat.   Raeford RazorStephen Dionna Wiedemann, MD 08/28/15 [phone]

## 2015-08-22 NOTE — ED Provider Notes (Signed)
Went to evaluate patient after vitals noted. At this time, still not in room.   Raeford RazorStephen Zyeir Dymek, MD 08/22/15 1120

## 2015-08-22 NOTE — Progress Notes (Signed)
ANTICOAGULATION CONSULT NOTE - Initial Consult  Pharmacy Consult for Warfarin Indication: atrial fibrillation  No Known Allergies  Patient Measurements:   Heparin Dosing Weight:   Vital Signs: Temp: 97.8 F (36.6 C) (04/04 1101) Temp Source: Oral (04/04 1101) BP: 112/64 mmHg (04/04 1645) Pulse Rate: 62 (04/04 1645)  Labs:  Recent Labs  08/22/15 1214 08/22/15 1215 08/22/15 1435  HGB 13.9 15.0  --   HCT 39.9 44.0  --   PLT 271  --   --   LABPROT  --   --  73.3*  INR  --   --  9.59*  CREATININE 1.51* 1.30*  --     CrCl cannot be calculated (Unknown ideal weight.).   Medical History: Past Medical History  Diagnosis Date  . Pacemaker   . Coronary artery disease     Assessment: 60-yo male with history of A-Fib, Heart Failure presents after 5 days of productive cough, shortness of breath, diarrhea and low-grade fever.  Pharmacy consulted to dose warfarin for his A-Fib.  INR at presentation is SUPRAtherapeutic (9.59) with CBC wnl and no signs of bleeding.  PTA warfarin dose is 5 mg daily, except for 7.5 mg on Sundays.  He has not taken his dose today and admits decreased PO intake over the previous week due to illness.  Goal of Therapy:  INR 2-3 Monitor platelets by anticoagulation protocol: Yes   Plan:  --HOLD dose of warfarin on 4/4 --Give Vitamin K 2.5 mg PO x 1 --Monitor INR and CBC daily  Kathlynn Gratedam Winson Eichorn 08/22/2015,5:02 PM

## 2015-08-23 DIAGNOSIS — J189 Pneumonia, unspecified organism: Secondary | ICD-10-CM | POA: Diagnosis not present

## 2015-08-23 LAB — CBC
HCT: 33.9 % — ABNORMAL LOW (ref 39.0–52.0)
Hemoglobin: 11.1 g/dL — ABNORMAL LOW (ref 13.0–17.0)
MCH: 29.4 pg (ref 26.0–34.0)
MCHC: 32.7 g/dL (ref 30.0–36.0)
MCV: 89.9 fL (ref 78.0–100.0)
Platelets: 262 10*3/uL (ref 150–400)
RBC: 3.77 MIL/uL — AB (ref 4.22–5.81)
RDW: 13.5 % (ref 11.5–15.5)
WBC: 11.3 10*3/uL — ABNORMAL HIGH (ref 4.0–10.5)

## 2015-08-23 LAB — BASIC METABOLIC PANEL
ANION GAP: 9 (ref 5–15)
BUN: 22 mg/dL — ABNORMAL HIGH (ref 6–20)
CALCIUM: 8.4 mg/dL — AB (ref 8.9–10.3)
CO2: 24 mmol/L (ref 22–32)
Chloride: 102 mmol/L (ref 101–111)
Creatinine, Ser: 1.23 mg/dL (ref 0.61–1.24)
Glucose, Bld: 97 mg/dL (ref 65–99)
Potassium: 4.6 mmol/L (ref 3.5–5.1)
Sodium: 135 mmol/L (ref 135–145)

## 2015-08-23 LAB — PROTIME-INR
INR: 5.1 (ref 0.00–1.49)
Prothrombin Time: 45.3 seconds — ABNORMAL HIGH (ref 11.6–15.2)

## 2015-08-23 MED ORDER — WARFARIN SODIUM 5 MG PO TABS: 5.0000 mg | ORAL_TABLET | ORAL | Status: DC

## 2015-08-23 NOTE — Discharge Summary (Signed)
Name: Joseph Carson MRN: [id number] DOB: [birth year] 60 y.o. PCP: No Pcp Per Patient  Date of Admission: 08/22/2015 11:06 AM Date of Discharge: 08/23/2015 Attending Physician: Burns Spain, MD  Discharge Diagnosis:  Principal Problem:   CAP (community acquired pneumonia) Active Problems:   Warfarin-induced coagulopathy (HCC)   Heart failure, chronic systolic (HCC)  Discharge Medications:   Medication List    STOP taking these medications        acetaminophen 325 MG tablet  Commonly known as:  TYLENOL      TAKE these medications        atorvastatin 40 MG tablet  Commonly known as:  LIPITOR  Take 40 mg by mouth daily.     carvedilol 6.25 MG tablet  Commonly known as:  COREG  Take 6.25 mg by mouth 2 (two) times daily with a meal.     DAYQUIL PO  Take 15 mLs by mouth every 8 (eight) hours as needed (body pains).     furosemide 40 MG tablet  Commonly known as:  LASIX  Take 40 mg by mouth daily.     HAIR/SKIN/NAILS PO  Take 1 tablet by mouth daily.     lisinopril 5 MG tablet  Commonly known as:  PRINIVIL,ZESTRIL  Take 5 mg by mouth 2 (two) times daily.     spironolactone 25 MG tablet  Commonly known as:  ALDACTONE  Take 25 mg by mouth daily.     warfarin 5 MG tablet  Commonly known as:  COUMADIN  Take 1 tablet (5 mg total) by mouth See admin instructions. Pt takes  everyday except sundays - pt takes 7.5mg  Sundays  Start taking on:  08/28/2015        Disposition and follow-up:   [name] was discharged from [facility] in Good condition.  At the hospital follow up visit please address:  1.  SOB, cough: Presumed viral infection. Still improving? NEEDS PFT's given smoking history and wheeze.   CHF: Restarted home medications? EF 20%.   Low BP: Baseline low. In the 90's. Asymptomatic.   2.  Labs / imaging needed at time of follow-up: BMP (AKI during admission)  3.  Pending labs/ test needing follow-up:  None  Follow-up Appointments: Follow-up Information    Follow up with Gara Kroner, MD On 08/28/2015.   Specialty:  Internal Medicine   Why:  2:45 PM   Contact information:   [address] [place] [address] [phone]       Discharge Instructions: Discharge Instructions    Diet - low sodium heart healthy    Complete by:  As directed      Increase activity slowly    Complete by:  As directed            Procedures Performed:  Dg Chest 2 View  08/22/2015  CLINICAL DATA:  Short of breath for 2 weeks. Cardiac surgery 3 years ago. EXAM: CHEST  2 VIEW COMPARISON:  None. FINDINGS: Normal heart size. Left subclavian AICD device. Ill-defined patchy and nodular densities in the lingula. Lungs otherwise clear. No pneumothorax. IMPRESSION: Patchy and nodular opacities are present in the lingula. Followup PA and lateral chest X-ray is recommended in 3-4 weeks following trial of antibiotic therapy to ensure resolution and exclude underlying malignancy. Electronically Signed   By: Jolaine Click M.D.   On: 08/22/2015 12:51   Admission HPI: [name] is a 60 y.o. male w/ PMHx of CAD s/p  CABG, systolic CHF w/ EF of 20% w/ AICD, atrial fibrillation on Coumadin, HTN, presents to the ED w/ complaints of productive cough and subjective fevers. Patient states he has had these symptoms for the past 7 days and things are not improving. He also admits to recent SOB, abdominal discomfort and diarrhea. He does admit to recent sick contacts (children). Patient denies dizziness, lightheadedness, vomiting, chest pain, PND, or orthopnea. Patient also states he has had very little po intake for the past week or so as well due to lack of appetite. He went to the Coumadin clinic today and was told his INR was 8.   In the ED, patient was noted to be with mildly decreased SpO2 to 94%, CXR with scattered infiltrates. No fever on exam, mild leukocytosis, normal lactic acid. Also with mild AKI.   Patient  smokes and has for many many years, does not drink, no recreational drug use.   Hospital Course by problem list: [name] is a 60 y.o. Man with a history of CHF s/p CABG in 2013, tobacco abuse, and A-fib, who presented to the [facility] ED on 08/22/2015 with a 5-day history of productive cough, intermittent fever, shortness of breath, and history of sick contacts suggestive of pneumonia. He complained of diarrhea that accompanied these other symptoms He also reported an INR of 8.0 from his coumadin clinic appointment on 08/21/2015.   Suspected Viral Pneumonia Patient's vitals were stable on admission, and his exam on admission was significant for diffuse course rales and expiratory wheezes. A CXR done in the ED showed patchy infiltrates and 1-2 pulmonary nodules. A CBC showed a mild leukocytosis. He was admitted to the floor for IV antibiotics and further testing. He was started on empiric CAP therapy including ceftriaxone 1 g, IV, and azithromycin 500 mg IV and given a bolus of 1500 mL of NS. Further work-up was negative for flu, Legionella, and pneumococcus. His procalcitonin was <0.1, suggesting the likelihood of a bacterial cause of pneumonia was small. Antibiotics were discontinued after the initial dose. On 08/23/2015, the patient reported feeling better, vitals remained stable, and his WBC count decreased. He was discharged from the hospital with return precautions.   Supratherapeutic warfarin Patient attributed his high INR to his decreased PO intake over the past few days. In the ED his INR was 9.6. Warfarin was held and he was given one 2.5 mg dose of vitamin K. On the morning of 4/5, his INR had decreased to 5.1. He was told by the warfarin clinic to continue to hold warfarin until his return appointment on Monday, 4/10.   Congestive heart failure Patient denied weight gain or edema. No signs of CHF exacerbation were apparent on exam. Given his decreased PO intake and slightly  decreased BP on admission, likely due to being volume down, furosemide and spironolactone were held. Atorvastatin was continued. He will restart all home medications at discharge.   Discharge Vitals:   BP 116/58 mmHg  Pulse 67  Temp(Src) 97.8 F (36.6 C) (Oral)  Resp 18  Ht  (1.778 m)  Wt 210 lb 4.8 oz (95.391 kg)  BMI 30.17 kg/m2  SpO2 98%  Discharge Labs:  Results for orders placed or performed during the hospital encounter of 08/22/15 (from the past 24 hour(s))  Urinalysis, Routine w reflex microscopic (not at [facility])     Status: Abnormal   Collection Time: 08/22/15  2:22 PM  Result Value Ref Range   Color, Urine AMBER (A) YELLOW  APPearance HAZY (A) CLEAR   Specific Gravity, Urine 1.023 1.005 - 1.030   pH 5.0 5.0 - 8.0   Glucose, UA NEGATIVE NEGATIVE mg/dL   Hgb urine dipstick NEGATIVE NEGATIVE   Bilirubin Urine SMALL (A) NEGATIVE   Ketones, ur 15 (A) NEGATIVE mg/dL   Protein, ur NEGATIVE NEGATIVE mg/dL   Nitrite NEGATIVE NEGATIVE   Leukocytes, UA NEGATIVE NEGATIVE  Strep pneumoniae urinary antigen     Status: None   Collection Time: 08/22/15  2:22 PM  Result Value Ref Range   Strep Pneumo Urinary Antigen NEGATIVE NEGATIVE  Protime-INR     Status: Abnormal   Collection Time: 08/22/15  2:35 PM  Result Value Ref Range   Prothrombin Time 73.3 (H) 11.6 - 15.2 seconds   INR 9.59 (HH) 0.00 - 1.49  I-Stat CG4 Lactic Acid, ED     Status: None   Collection Time: 08/22/15  4:25 PM  Result Value Ref Range   Lactic Acid, Venous 1.74 0.5 - 2.0 mmol/L  Influenza panel by PCR (type A & B, H1N1)     Status: None   Collection Time: 08/22/15  4:52 PM  Result Value Ref Range   Influenza A By PCR NEGATIVE NEGATIVE   Influenza B By PCR NEGATIVE NEGATIVE   H1N1 flu by pcr NOT DETECTED NOT DETECTED  Procalcitonin - Baseline     Status: None   Collection Time: 08/22/15  8:43 PM  Result Value Ref Range   Procalcitonin <0.10 ng/mL  Basic metabolic panel     Status: Abnormal    Collection Time: 08/23/15  3:33 AM  Result Value Ref Range   Sodium 135 135 - 145 mmol/L   Potassium 4.6 3.5 - 5.1 mmol/L   Chloride 102 101 - 111 mmol/L   CO2 24 22 - 32 mmol/L   Glucose, Bld 97 65 - 99 mg/dL   BUN 22 (H) 6 - 20 mg/dL   Creatinine, Ser 2.951.23 0.61 - 1.24 mg/dL   Calcium 8.4 (L) 8.9 - 10.3 mg/dL   GFR calc non Af Amer >60 >60 mL/min   GFR calc Af Amer >60 >60 mL/min   Anion gap 9 5 - 15  CBC     Status: Abnormal   Collection Time: 08/23/15  3:33 AM  Result Value Ref Range   WBC 11.3 (H) 4.0 - 10.5 K/uL   RBC 3.77 (L) 4.22 - 5.81 MIL/uL   Hemoglobin 11.1 (L) 13.0 - 17.0 g/dL   HCT 62.133.9 (L) 30.839.0 - 65.752.0 %   MCV 89.9 78.0 - 100.0 fL   MCH 29.4 26.0 - 34.0 pg   MCHC 32.7 30.0 - 36.0 g/dL   RDW 84.613.5 96.211.5 - 95.215.5 %   Platelets 262 150 - 400 K/uL  Protime-INR     Status: Abnormal   Collection Time: 08/23/15  3:33 AM  Result Value Ref Range   Prothrombin Time 45.3 (H) 11.6 - 15.2 seconds   INR 5.10 (HH) 0.00 - 1.49    Signed: Courtney ParisEden W Nimisha Rathel, MD 08/23/2015, 2:00 PM    Services Ordered on Discharge: None Equipment Ordered on Discharge: None

## 2015-08-23 NOTE — Progress Notes (Signed)
ANTICOAGULATION CONSULT NOTE - Follow Up Consult  Pharmacy Consult for Coumadin Indication: atrial fibrillation  No Known Allergies  Patient Measurements: Height: 5\' 10"  (177.8 cm) Weight: 210 lb 4.8 oz (95.391 kg) (scale c) IBW/kg (Calculated) : 73 Heparin Dosing Weight:   Vital Signs: Temp: 98.4 F (36.9 C) (04/05 0432) Temp Source: Oral (04/05 0432) BP: 113/59 mmHg (04/05 0432) Pulse Rate: 69 (04/05 0432)  Labs:  Recent Labs  08/22/15 1214 08/22/15 1215 08/22/15 1435 08/23/15 0333  HGB 13.9 15.0  --  11.1*  HCT 39.9 44.0  --  33.9*  PLT 271  --   --  262  LABPROT  --   --  73.3* 45.3*  INR  --   --  9.59* 5.10*  CREATININE 1.51* 1.30*  --  1.23    Estimated Creatinine Clearance: 74.1 mL/min (by C-G formula based on Cr of 1.23).   Medications:  Scheduled:  . atorvastatin  40 mg Oral q1800  . Warfarin - Pharmacist Dosing Inpatient   Does not apply q1800    Assessment: 60yo male with URI, admitted with supratherapeutic INR and received Vit K 2.5mg  po.  INR this AM much improved but remains elevated.  Hg 11.1, pltc wnl, no bleeding.  Goal of Therapy:  INR 2-3 Monitor platelets by anticoagulation protocol: Yes   Plan:  No Coumadin today Continue daily INR Watch for s/s of bleeding  Keri Tavella P 08/23/2015,10:11 AM

## 2015-08-23 NOTE — Progress Notes (Signed)
Subjective: Doing well today, no distress. Breathing improved. Still with cough.   Objective: Vital signs in last 24 hours: Filed Vitals:   08/22/15 1815 08/22/15 1854 08/22/15 2257 08/23/15 0432  BP: 104/59 121/61 102/50 113/59  Pulse: 59 58 60 69  Temp:  98.1 F (36.7 C) 98.8 F (37.1 C) 98.4 F (36.9 C)  TempSrc:  Oral Oral Oral  Resp: 17 18 16 18   Height:  5\' 10"  (1.778 m)    Weight:  213 lb 3.2 oz (96.707 kg)  210 lb 4.8 oz (95.391 kg)  SpO2: 95% 98%  95%   Weight change:   Intake/Output Summary (Last 24 hours) at 08/23/15 1033 Last data filed at 08/23/15 0848  Gross per 24 hour  Intake   1860 ml  Output    900 ml  Net    960 ml   General: White male, alert, cooperative, NAD. HEENT: PERRL, EOMI. Moist mucus membranes Neck: Full range of motion without pain, supple, no lymphadenopathy or carotid bruits Lungs: Air entry equal bilaterally, rhonchi and wheezes throughout. No rales.  Heart: RRR, no murmurs, gallops, or rubs Abdomen: Soft, non-tender, non-distended, BS + Extremities: No cyanosis, clubbing, or edema Neurologic: Alert & oriented x3, cranial nerves II-XII intact, strength grossly intact, sensation intact to light touch  Lab Results: Basic Metabolic Panel:  Recent Labs Lab 08/22/15 1214 08/22/15 1215 08/23/15 0333  NA 135 134* 135  K 4.7 4.5 4.6  CL 101 99* 102  CO2 21*  --  24  GLUCOSE 104* 104* 97  BUN 32* 35* 22*  CREATININE 1.51* 1.30* 1.23  CALCIUM 9.2  --  8.4*   CBC:  Recent Labs Lab 08/22/15 1214 08/22/15 1215 08/23/15 0333  WBC 12.8*  --  11.3*  NEUTROABS 10.0*  --   --   HGB 13.9 15.0 11.1*  HCT 39.9 44.0 33.9*  MCV 88.7  --  89.9  PLT 271  --  262   Coagulation:  Recent Labs Lab 08/22/15 1435 08/23/15 0333  LABPROT 73.3* 45.3*  INR 9.59* 5.10*   Urinalysis:  Recent Labs Lab 08/22/15 1422  COLORURINE AMBER*  LABSPEC 1.023  PHURINE 5.0  GLUCOSEU NEGATIVE  HGBUR NEGATIVE  BILIRUBINUR SMALL*  KETONESUR 15*    PROTEINUR NEGATIVE  NITRITE NEGATIVE  LEUKOCYTESUR NEGATIVE   Studies/Results: Dg Chest 2 View  08/22/2015  CLINICAL DATA:  Short of breath for 2 weeks. Cardiac surgery 3 years ago. EXAM: CHEST  2 VIEW COMPARISON:  None. FINDINGS: Normal heart size. Left subclavian AICD device. Ill-defined patchy and nodular densities in the lingula. Lungs otherwise clear. No pneumothorax. IMPRESSION: Patchy and nodular opacities are present in the lingula. Followup PA and lateral chest X-ray is recommended in 3-4 weeks following trial of antibiotic therapy to ensure resolution and exclude underlying malignancy. Electronically Signed   By: Jolaine ClickArthur  Hoss M.D.   On: 08/22/2015 12:51   Medications: I have reviewed the patient's current medications. Scheduled Meds: . atorvastatin  40 mg Oral q1800  . Warfarin - Pharmacist Dosing Inpatient   Does not apply q1800   Continuous Infusions:  PRN Meds:.guaiFENesin, ipratropium-albuterol, ondansetron **OR** ondansetron (ZOFRAN) IV   Assessment/Plan: 60 y.o. male w/ PMHx of CAD s/p CABG (several years ago), systolic CHF w/ EF of 20% w/ AICD, atrial fibrillation on Coumadin, HTN, admitted for cough and SOB.   Viral Pneumonia: Patient with small scattered ?infiltrates vs nodules on CXR, SOB, and productive cough. No clear cut pneumonia. Lungs with scattered rhonchi and  wheezes. Looks comfortable on exam. SpO2 95% on RA. Procalcitonin 0, only mild leukocytosis. Patient with baseline low BP. Do not suspect this is a bacterial pneumonia. Strep pnuemo Ag negative.  -Ambulate with pulse ox -D/c ABx -Urine legionella Qg pending -Duonebs q4h prn -Given long h/o smoking and wheezes on exam, will likely need pulmonary outpatient and PFT's  Atrial Fibrillation with Supratherapeutic INR: On Coumadin. INR decreased to 5.10 today after 2.5 Vitamin K. No signs of bleeding.  -Hold Coumadin. Patient to follow up with Coumadin clinic.   AKI: Improved.  -Resume home meds on discharge.    CHF: Patient with ischemic cardiomyopathy (s/p CABG x3), EF 20%. Has another chart that needs to be merged. Follows with Dr. Shirlee Latch and Ladona Ridgel. ICD in place.  -Restart home medications on discharge.  -Continue Lipitor  DVT/PE PPx: Coumadin as above  Dispo: Anticipated discharge today.  The patient does not have a current PCP (No Pcp Per Patient) and does need an [facility] hospital follow-up appointment after discharge.  The patient does not have transportation limitations that hinder transportation to clinic appointments.  .Services Needed at time of discharge: Y = Yes, Blank = No PT:   OT:   RN:   Equipment:   Other:     LOS: 1 day   Courtney Paris, MD 08/23/2015, 10:33 AM

## 2015-08-23 NOTE — Discharge Instructions (Signed)
[name], you are being discharged from the hospital with a diagnosis of viral pneumonia.  Your cough will likely continue for 6-8 weeks, but you should start to feel better, otherwise.  Please refer to the following instructions.  1) Please restart all of your home medications, with the exception of coumadin.   2) Follow the instructions of the coumadin clinic on when to restart coumadin. 3) You have an appointment at [facility] on Monday, April 10, at 2:45 PM with Dr. Danella Pentonruong. 4) Please return to the emergency room if you experience any of the following symptoms:  Fever >101, shortness of breath at rest, bleeding.

## 2015-08-23 NOTE — Progress Notes (Signed)
Pt 02 sat 95% prior to ambulating and 93% with ambulation on RA.  Joseph Carson, Charity fundraiserN.

## 2015-08-23 NOTE — Progress Notes (Signed)
Pt placed on telemetry upon arrival to floor. No order for tele at this time. Night shift RN called MD for tele order. MD says no need for telemetry. Will continue to monitor.

## 2015-08-23 NOTE — Progress Notes (Signed)
  Date: 08/23/2015  Patient name: Joseph Carson  Medical record number: [id number]  Date of birth: 03/15/1956   I have seen and evaluated Joseph Carson and discussed their care with the Residency Team. Mr Allison Quarryrendt is a [age] yo male with known CAD s/p CABG in [place], LV thrombus on warfarin, HF with EF 20% in Jan 2016 s/p MDT CRTD  2/2 ischemic hear dz, and tobacco use (started in teen's max 3 PPD, now 3-6 cigs / day). He was admitted for a 5 day h/o productive cough and fevers. His grandkids were ill and he thinks he caught it from them. He has dyspnea and abd / rib tenderness from coughing. He initially was felt to have a bacterial CAP and was admitted for IV ABX.   PMHx, Fam Hx, and/or Soc Hx : Moved from [place] to [place] to live with daughter. His car is not working so now relies on daughter for transportation. Mother had thyroid Dz and DM.  Filed Vitals:   08/22/15 2257 08/23/15 0432  BP: 102/50 113/59  Pulse: 60 69  Temp: 98.8 F (37.1 C) 98.4 F (36.9 C)  Resp: 16 18   Gen : NAD, able to speak in full sentences. Using ABD muscles slightly.  HRRR L good air flow but diffuse wheezing exp only ABD obese, + BS  CXR : the team and I independently reviewed the images and verified our reading with the official radiologist's interpretation.  EKG : I indep reviewed his EKG which shows a ventricular paced rhythm   Assessment and Plan: I have seen and evaluated the patient as outlined above. I agree with the formulated Assessment and Plan as detailed in the residents' note, with the following changes:   1. Viral lower resp infxn - The CXR was officially read as PNA but the images are not impressive. Procalcitonin was 0. Strep pneumo negative. Legionella pending. Afebrile here, only mild leukocytosis. If caught from grandkids, then also supports viral etiology. Pt is stable to stop ABXand cont supportive tx. Educated that the cough might require weeks (6-8) to resolve esp in light of his  extensive tobacco hx.   2. CAD - ASA had been stopped in light of stable dz and use of warfarin but that provides no anti-plt effects. Once INR stable, would rec ASA 81 QD.  3. Tobacco use - he is working on cessation. PFT's as outpt once stable.  [facility] and encouraged him to call.  Burns SpainElizabeth A Cathy Ropp, MD 4/5/201710:34 AM

## 2015-08-24 ENCOUNTER — Encounter (HOSPITAL_COMMUNITY): Payer: Self-pay | Admitting: Emergency Medicine

## 2015-08-24 LAB — LEGIONELLA PNEUMOPHILA SEROGP 1 UR AG: L. PNEUMOPHILA SEROGP 1 UR AG: NEGATIVE

## 2015-08-24 NOTE — Progress Notes (Signed)
Late pt received all d/c instructions, explained and given to pt. Verbalized understanding.  D/cc off floor yesterday via w/c to awaiting transport.  Amanda PeaNellie Zaylia Riolo, RN

## 2015-08-25 ENCOUNTER — Telehealth: Payer: Self-pay | Admitting: General Practice

## 2015-08-25 NOTE — Telephone Encounter (Signed)
APPT. REMINDER CALL, LMTCB °

## 2015-08-28 ENCOUNTER — Ambulatory Visit (INDEPENDENT_AMBULATORY_CARE_PROVIDER_SITE_OTHER): Payer: Medicaid Other

## 2015-08-28 ENCOUNTER — Encounter: Payer: Self-pay | Admitting: Internal Medicine

## 2015-08-28 ENCOUNTER — Ambulatory Visit (INDEPENDENT_AMBULATORY_CARE_PROVIDER_SITE_OTHER): Payer: Medicaid Other | Admitting: Internal Medicine

## 2015-08-28 VITALS — BP 81/44 | HR 80 | Temp 98.2°F | Ht 70.0 in | Wt 216.7 lb

## 2015-08-28 DIAGNOSIS — I5022 Chronic systolic (congestive) heart failure: Secondary | ICD-10-CM

## 2015-08-28 DIAGNOSIS — I513 Intracardiac thrombosis, not elsewhere classified: Secondary | ICD-10-CM

## 2015-08-28 DIAGNOSIS — Z7901 Long term (current) use of anticoagulants: Secondary | ICD-10-CM

## 2015-08-28 DIAGNOSIS — F17210 Nicotine dependence, cigarettes, uncomplicated: Secondary | ICD-10-CM | POA: Diagnosis not present

## 2015-08-28 DIAGNOSIS — I213 ST elevation (STEMI) myocardial infarction of unspecified site: Secondary | ICD-10-CM

## 2015-08-28 DIAGNOSIS — Z720 Tobacco use: Secondary | ICD-10-CM

## 2015-08-28 DIAGNOSIS — I951 Orthostatic hypotension: Secondary | ICD-10-CM

## 2015-08-28 DIAGNOSIS — J189 Pneumonia, unspecified organism: Secondary | ICD-10-CM | POA: Diagnosis not present

## 2015-08-28 DIAGNOSIS — Z5181 Encounter for therapeutic drug level monitoring: Secondary | ICD-10-CM | POA: Diagnosis not present

## 2015-08-28 LAB — POCT INR: INR: 1.2

## 2015-08-28 NOTE — Progress Notes (Signed)
Subjective:   Patient ID: Joseph Carson male   DOB: 1955-12-24 60 y.o.   MRN: [id number]  HPI: [name] is a 60 y.o. with past medical history as outlined below who presents to clinic for hospital f/u. Pt was admitted 4/4-5 for CAP. Pt denies SOB, wheezing, and fevers. He has pleurtic chest pain from cough, cough is improving. He has a cardiology appt today at 4 and is in a rush to make it to this appointment. Briefly discussed preventative care with him during this visit.   Please see problem list for status of the pt's chronic medical problems.  Past Medical History  Diagnosis Date  . Chronic combined systolic and diastolic heart failure [facility]) November 2014  . Obesity   . Pulmonary hypertension (HCC)   . Hyperbilirubinemia   . Umbilical hernia   . Ischemic cardiomyopathy     a. echo (10/15):  EF 20%, diff HK, apical AK, mobile density inf wall near papillary muscle (?ruptured) with mod MR, mod reduced RVSF, mild PI, PASP 37 mmHg b. s/p CRTD   . CAD (coronary artery disease)     a. s/p CABG x 3 03/2013 (L-LAD, S-RI, S-PDA) in [place]  . LV (left ventricular) mural thrombus (HCC)      a. TEE (10/15):  EF 15-20%, septum and ant walls AK, lat wall HK, no papillary muscle rupture, probable small thrombus on ant/ant-septal wall, mild MR, mod decreased RVSF, neg bubble study  . NSVT (nonsustained ventricular tachycardia) (HCC)         . Mitral regurgitation      mild by TEE 02/2014  . LBBB (left bundle branch block)    . HLD (hyperlipidemia)    . Pacemaker   . Coronary artery disease   . CAP (community acquired pneumonia) 08/22/2015  . AICD (automatic cardioverter/defibrillator) present   . CHF (congestive heart failure) (HCC)   . Atrial fibrillation, chronic (HCC)    Current Outpatient Prescriptions  Medication Sig Dispense Refill  . acetaminophen (TYLENOL) 325 MG tablet Take 1-2 tablets (325-650 mg total) by mouth every 4 (four) hours as needed for mild pain.      Marland Kitchen atorvastatin (LIPITOR) 40 MG tablet TAKE 1 TABLET BY MOUTH EVERY DAY AT 6PM. 30 tablet 3  . atorvastatin (LIPITOR) 40 MG tablet Take 40 mg by mouth daily.    . Biotin w/ Vitamins C & E (HAIR/SKIN/NAILS PO) Take 1 tablet by mouth daily.    . carvedilol (COREG) 6.25 MG tablet TAKE 2 TABLETS BY MOUTH TWICE DAILY WITH A MEAL 120 tablet 11  . carvedilol (COREG) 6.25 MG tablet Take 6.25 mg by mouth 2 (two) times daily with a meal.    . furosemide (LASIX) 40 MG tablet Take 1 tablet (40 mg total) by mouth daily. 30 tablet 3  . furosemide (LASIX) 40 MG tablet Take 40 mg by mouth daily.    Marland Kitchen lisinopril (PRINIVIL,ZESTRIL) 5 MG tablet Take 1 tablet (5 mg total) by mouth 2 (two) times daily. 60 tablet 3  . lisinopril (PRINIVIL,ZESTRIL) 5 MG tablet Take 5 mg by mouth 2 (two) times daily.    . Multiple Vitamins-Minerals (HAIR/SKIN/NAILS PO) Take 1 tablet by mouth daily.    . Pseudoephedrine-APAP-DM (DAYQUIL PO) Take 15 mLs by mouth every 8 (eight) hours as needed (body pains).    Marland Kitchen spironolactone (ALDACTONE) 25 MG tablet Take 1 tablet (25 mg total) by mouth daily. 30 tablet 3  . spironolactone (ALDACTONE) 25 MG tablet Take 25  mg by mouth daily.    Marland Kitchen. warfarin (COUMADIN) 5 MG tablet TAKE 1 TABLET BY MOUTH EVERY DAY EXCEPT TAKE 1 & 1/2 TABLETS BY MOUTH ON SUNDAYS OR AS DIRECTED BY COUMADIN CLINIC. 40 tablet 3  . warfarin (COUMADIN) 5 MG tablet Take 1 tablet (5 mg total) by mouth See admin instructions. Pt takes 5mg  everyday except sundays - pt takes 7.5mg  Sundays     No current facility-administered medications for this visit.   Family History  Problem Relation Age of Onset  . Diabetes Mother     Deceased  . Thyroid disease Mother    Social History   Social History  . Marital Status: Divorced    Spouse Name: N/A  . Number of Children: N/A  . Years of Education: N/A   Social History Main Topics  . Smoking status: Current Every Day Smoker -- 0.30 packs/day for 45 years    Types: Cigarettes  .  Smokeless tobacco: Never Used     Comment: DOWN TO 1 CIGARETTE A DAY  . Alcohol Use: No  . Drug Use: No     Comment: 03/01/2014 "used last ~ 2 months ago"  . Sexual Activity: Not Currently   Other Topics Concern  . None   Social History Narrative   ** Merged History Encounter **       Review of Systems: Review of Systems  Constitutional: Negative for fever and chills.  Eyes: Negative for blurred vision.  Respiratory: Positive for cough (improving). Negative for shortness of breath.   Cardiovascular: Positive for chest pain (pleuritic chest pain when coughing).  Gastrointestinal: Negative for blood in stool.  Neurological: Negative for dizziness.       Chronic orthostatic hypotension since starting coumadin years ago.     Objective:  Physical Exam: Filed Vitals:   08/28/15 1512  BP: 81/44  Pulse: 80  Temp: 98.2 F (36.8 C)  TempSrc: Oral  Height: 5\' 10"  (1.778 m)  Weight: 216 lb 11.2 oz (98.294 kg)  SpO2: 98%   Physical Exam  Constitutional: He appears well-developed and well-nourished. No distress.  HENT:  Head: Normocephalic and atraumatic.  Nose: Nose normal.  Cardiovascular: Normal rate, regular rhythm and normal heart sounds.  Exam reveals no gallop and no friction rub.   No murmur heard. Pulmonary/Chest: Effort normal and breath sounds normal. No respiratory distress. He has no wheezes. He has no rales.  Abdominal: Soft. Bowel sounds are normal. He exhibits no distension and no mass. There is no tenderness. There is no rebound and no guarding.  Skin: Skin is warm and dry. No rash noted. He is not diaphoretic. No erythema. No pallor.   Assessment & Plan:   Please see problem based assessment and plan.

## 2015-08-28 NOTE — Assessment & Plan Note (Signed)
Smoked 1/2 PPD since he was 60 y/o. Is in the process of quitting, last took 2 drags of his ciggarette today.   - will continue to monitor.

## 2015-08-28 NOTE — Assessment & Plan Note (Signed)
Pt has EF of 20% w/ biventricular ICD. His BP is 81/44 today and he denies dizziness or light headedness. He has chronic orthostatic hypotension that he states he has had since starting on coumadin. He is eating well and tolerating po intake. Likely can decreased his lisinopril 5mg  BID to 2.5mg  BID which he is probably on for afterload reduction. Will defer to cardiology as he is currently asymptomatic.

## 2015-08-28 NOTE — Assessment & Plan Note (Signed)
Resolving. Pt admitted for viral CAP last week. Feeling better, cough is improving.

## 2015-09-04 ENCOUNTER — Other Ambulatory Visit: Payer: Self-pay | Admitting: Internal Medicine

## 2015-09-05 ENCOUNTER — Ambulatory Visit (INDEPENDENT_AMBULATORY_CARE_PROVIDER_SITE_OTHER): Payer: Medicaid Other | Admitting: *Deleted

## 2015-09-05 DIAGNOSIS — I213 ST elevation (STEMI) myocardial infarction of unspecified site: Secondary | ICD-10-CM | POA: Diagnosis not present

## 2015-09-05 DIAGNOSIS — Z5181 Encounter for therapeutic drug level monitoring: Secondary | ICD-10-CM | POA: Diagnosis not present

## 2015-09-05 DIAGNOSIS — I513 Intracardiac thrombosis, not elsewhere classified: Secondary | ICD-10-CM

## 2015-09-05 LAB — POCT INR: INR: 2.1

## 2015-09-13 NOTE — Progress Notes (Signed)
Internal Medicine Clinic Attending  Case discussed with Dr. Truong at the time of the visit.  We reviewed the resident's history and exam and pertinent patient test results.  I agree with the assessment, diagnosis, and plan of care documented in the resident's note.  

## 2015-09-19 ENCOUNTER — Ambulatory Visit (INDEPENDENT_AMBULATORY_CARE_PROVIDER_SITE_OTHER): Payer: Medicaid Other | Admitting: *Deleted

## 2015-09-19 DIAGNOSIS — I213 ST elevation (STEMI) myocardial infarction of unspecified site: Secondary | ICD-10-CM

## 2015-09-19 DIAGNOSIS — I513 Intracardiac thrombosis, not elsewhere classified: Secondary | ICD-10-CM

## 2015-09-19 DIAGNOSIS — Z5181 Encounter for therapeutic drug level monitoring: Secondary | ICD-10-CM

## 2015-09-19 LAB — POCT INR: INR: 2

## 2015-09-29 ENCOUNTER — Ambulatory Visit: Payer: Self-pay | Admitting: Internal Medicine

## 2015-09-29 ENCOUNTER — Encounter: Payer: Medicaid Other | Admitting: Internal Medicine

## 2015-10-03 ENCOUNTER — Other Ambulatory Visit: Payer: Self-pay | Admitting: Internal Medicine

## 2015-10-04 ENCOUNTER — Encounter: Payer: Self-pay | Admitting: Internal Medicine

## 2015-10-04 ENCOUNTER — Other Ambulatory Visit: Payer: Self-pay | Admitting: *Deleted

## 2015-10-04 ENCOUNTER — Ambulatory Visit (INDEPENDENT_AMBULATORY_CARE_PROVIDER_SITE_OTHER): Payer: Medicaid Other | Admitting: Pharmacist

## 2015-10-04 ENCOUNTER — Ambulatory Visit (INDEPENDENT_AMBULATORY_CARE_PROVIDER_SITE_OTHER): Payer: Medicaid Other | Admitting: Internal Medicine

## 2015-10-04 VITALS — BP 104/62 | HR 89 | Ht 70.0 in | Wt 218.1 lb

## 2015-10-04 VITALS — BP 106/58 | HR 62 | Temp 98.1°F | Wt 220.6 lb

## 2015-10-04 DIAGNOSIS — I513 Intracardiac thrombosis, not elsewhere classified: Secondary | ICD-10-CM

## 2015-10-04 DIAGNOSIS — F17200 Nicotine dependence, unspecified, uncomplicated: Secondary | ICD-10-CM | POA: Diagnosis present

## 2015-10-04 DIAGNOSIS — I213 ST elevation (STEMI) myocardial infarction of unspecified site: Secondary | ICD-10-CM | POA: Diagnosis not present

## 2015-10-04 DIAGNOSIS — Z1211 Encounter for screening for malignant neoplasm of colon: Secondary | ICD-10-CM | POA: Insufficient documentation

## 2015-10-04 DIAGNOSIS — Z5181 Encounter for therapeutic drug level monitoring: Secondary | ICD-10-CM | POA: Diagnosis not present

## 2015-10-04 DIAGNOSIS — I255 Ischemic cardiomyopathy: Secondary | ICD-10-CM | POA: Diagnosis not present

## 2015-10-04 DIAGNOSIS — Z720 Tobacco use: Secondary | ICD-10-CM

## 2015-10-04 LAB — POCT INR: INR: 2.4

## 2015-10-04 MED ORDER — SPIRONOLACTONE 25 MG PO TABS: 25.0000 mg | ORAL_TABLET | Freq: Every day | ORAL | Status: DC

## 2015-10-04 MED ORDER — NICOTINE 7 MG/24HR TD PT24: 7.0000 mg | MEDICATED_PATCH | TRANSDERMAL | Status: DC

## 2015-10-04 MED ORDER — NICOTINE POLACRILEX 4 MG MT LOZG: 4.0000 mg | LOZENGE | OROMUCOSAL | Status: DC | PRN

## 2015-10-04 MED ORDER — ATORVASTATIN CALCIUM 40 MG PO TABS: 40.0000 mg | ORAL_TABLET | Freq: Every day | ORAL | Status: DC

## 2015-10-04 NOTE — Patient Instructions (Signed)
Mr. Samule Dryrendt,  Great to see you today.  Congratulations on quitting smoking! Please use the patch and lozenge.  We are providing stool cards today for colon cancer screening.  We'll see you in 6 months.

## 2015-10-04 NOTE — Patient Instructions (Signed)
Medication Instructions:  Your physician recommends that you continue on your current medications as directed. Please refer to the Current Medication list given to you today.   Labwork: None ordered   Testing/Procedures: None ordered   Follow-Up: Your physician wants you to follow-up in: 12 months with Dr Taylor You will receive a reminder letter in the mail two months in advance. If you don't receive a letter, please call our office to schedule the follow-up appointment.  Remote monitoring is used to monitor your  ICD from home. This monitoring reduces the number of office visits required to check your device to one time per year. It allows us to keep an eye on the functioning of your device to ensure it is working properly. You are scheduled for a device check from home on 01/03/16. You may send your transmission at any time that day. If you have a wireless device, the transmission will be sent automatically. After your physician reviews your transmission, you will receive a postcard with your next transmission date.     Any Other Special Instructions Will Be Listed Below (If Applicable).     If you need a refill on your cardiac medications before your next appointment, please call your pharmacy.   

## 2015-10-04 NOTE — Progress Notes (Signed)
   Subjective:    Patient ID: Stann OreMichael Roland Talarico, male    DOB: Dec 18, 1955, 60 y.o.   MRN: [id number]  HPI  Mr Allison Quarryrendt is a 60 year old man with a PMH of ischemic cardiomyopathy (EF 20%) with biventricular AICD, atrial fibrillation with h/o mural thrombus (on warfarin) who comes to the clinic for a general wellness visit.   Specifically, the patient continues to smoke 1/2 ppd. He has tried quitting multiple times without success. He would prefer to quit "cold [place]," but is open to other options if they are effective. He would prefer a nicotine lozenge rather than gum given his dentures.  His last colonoscopy was >10 years ago. He is interested in colon cancer screening today and would prefer stool cards to colonoscopy.  Review of Systems  Constitutional: Negative for fever and fatigue.  Respiratory: Negative for cough and shortness of breath.   Cardiovascular: Negative for chest pain, palpitations and leg swelling.  Neurological: Negative for dizziness, light-headedness and headaches.  Psychiatric/Behavioral: Negative for dysphoric mood. The patient is not nervous/anxious.        Objective:   Physical Exam  Constitutional: He appears well-developed. No distress.  Cardiovascular: Normal rate, regular rhythm and normal heart sounds.   Pulmonary/Chest: Effort normal and breath sounds normal. No respiratory distress. He has no wheezes.  Abdominal: Soft. Bowel sounds are normal. He exhibits no distension. There is no tenderness.  Musculoskeletal: He exhibits no edema.  Neurological: He is alert.  Psychiatric: He has a normal mood and affect. His behavior is normal.  Vitals reviewed.         Assessment & Plan:   Please see problem based assessment and plan for details.

## 2015-10-05 LAB — CUP PACEART INCLINIC DEVICE CHECK
Battery Remaining Longevity: 85 mo
Brady Statistic AP VP Percent: 23.29 %
Brady Statistic AP VS Percent: 0.71 %
Brady Statistic AS VS Percent: 5.45 %
HIGH POWER IMPEDANCE MEASURED VALUE: 79 Ohm
Implantable Lead Implant Date: 20160414
Implantable Lead Location: 753858
Implantable Lead Location: 753859
Implantable Lead Location: 753860
Implantable Lead Model: 4398
Lead Channel Impedance Value: 532 Ohm
Lead Channel Impedance Value: 532 Ohm
Lead Channel Impedance Value: 589 Ohm
Lead Channel Impedance Value: 817 Ohm
Lead Channel Impedance Value: 874 Ohm
Lead Channel Impedance Value: 893 Ohm
Lead Channel Pacing Threshold Amplitude: 0.75 V
Lead Channel Pacing Threshold Amplitude: 1.25 V
Lead Channel Pacing Threshold Pulse Width: 0.8 ms
Lead Channel Sensing Intrinsic Amplitude: 0.875 mV
Lead Channel Sensing Intrinsic Amplitude: 12.5 mV
Lead Channel Setting Pacing Amplitude: 0.5 V
Lead Channel Setting Pacing Amplitude: 1.75 V
Lead Channel Setting Pacing Pulse Width: 0.03 ms
Lead Channel Setting Pacing Pulse Width: 0.8 ms
Lead Channel Setting Sensing Sensitivity: 0.3 mV
MDC IDC LEAD IMPLANT DT: 20160414
MDC IDC LEAD IMPLANT DT: 20160414
MDC IDC LEAD MODEL: 6935
MDC IDC MSMT BATTERY VOLTAGE: 2.99 V
MDC IDC MSMT LEADCHNL LV IMPEDANCE VALUE: 456 Ohm
MDC IDC MSMT LEADCHNL LV IMPEDANCE VALUE: 513 Ohm
MDC IDC MSMT LEADCHNL LV IMPEDANCE VALUE: 532 Ohm
MDC IDC MSMT LEADCHNL LV IMPEDANCE VALUE: 836 Ohm
MDC IDC MSMT LEADCHNL LV IMPEDANCE VALUE: 836 Ohm
MDC IDC MSMT LEADCHNL RA PACING THRESHOLD PULSEWIDTH: 0.4 ms
MDC IDC MSMT LEADCHNL RV IMPEDANCE VALUE: 399 Ohm
MDC IDC MSMT LEADCHNL RV IMPEDANCE VALUE: 513 Ohm
MDC IDC MSMT LEADCHNL RV SENSING INTR AMPL: 11.625 mV
MDC IDC SESS DTM: 20170517175322
MDC IDC SET LEADCHNL LV PACING AMPLITUDE: 2.5 V
MDC IDC STAT BRADY AS VP PERCENT: 70.55 %
MDC IDC STAT BRADY RA PERCENT PACED: 24 %
MDC IDC STAT BRADY RV PERCENT PACED: 89.48 %

## 2015-10-05 NOTE — Assessment & Plan Note (Signed)
A: Patient is appropriate for screening. Prefers stool cards at this time.  P: Stool cards provided.

## 2015-10-05 NOTE — Progress Notes (Signed)
Internal Medicine Clinic Attending  Case discussed with Dr. Ford at the time of the visit.  We reviewed the resident's history and exam and pertinent patient test results.  I agree with the assessment, diagnosis, and plan of care documented in the resident's note.  

## 2015-10-05 NOTE — Assessment & Plan Note (Signed)
A: Patient interested in quitting today. He was counseled for 10 minutes on smoking cessation strategies and the benefits of smoking cessation, especially to help prevent additional insults to his heart. Because of his dentures, he prefers lozenges to gum  P: 7 mg nicotine patch + nicotine lozenge

## 2015-10-06 NOTE — Progress Notes (Signed)
HPI Joseph Carson returns today for followup. He has longstanding chronic systolic heart failure, and LBBB and underwent Biv ICD implant over 12 months ago. He has done well.  He has had no edema, syncope or ICD shocks. No chest pain. His CHF is class 2.  No Known Allergies   Current Outpatient Prescriptions  Medication Sig Dispense Refill  . acetaminophen (TYLENOL) 325 MG tablet Take 1-2 tablets (325-650 mg total) by mouth every 4 (four) hours as needed for mild pain.    . Biotin w/ Vitamins C & E (HAIR/SKIN/NAILS PO) Take 1 tablet by mouth daily.    . carvedilol (COREG) 6.25 MG tablet Take 6.25 mg by mouth 2 (two) times daily with a meal.    . furosemide (LASIX) 40 MG tablet Take 40 mg by mouth daily.    Marland Kitchen lisinopril (PRINIVIL,ZESTRIL) 5 MG tablet TAKE 1 TABLET TWICE DAILY. 60 tablet 3  . Multiple Vitamins-Minerals (HAIR/SKIN/NAILS PO) Take 1 tablet by mouth daily.    Marland Kitchen spironolactone (ALDACTONE) 25 MG tablet Take 1 tablet (25 mg total) by mouth daily. 90 tablet 3  . warfarin (COUMADIN) 5 MG tablet Take 1 tablet (5 mg total) by mouth See admin instructions. Pt takes  everyday except sundays - pt takes 7.5mg  Sundays    . atorvastatin (LIPITOR) 40 MG tablet Take 1 tablet (40 mg total) by mouth daily. 90 tablet 3  . nicotine (NICODERM CQ - DOSED IN MG/24 HR) 7 mg/24hr patch Place 1 patch (7 mg total) onto the skin daily. 30 patch 5  . nicotine polacrilex ([employer] NICOTINE POLACRILEX) 4 MG lozenge Take 1 lozenge (4 mg total) by mouth as needed for smoking cessation. 100 tablet 0   No current facility-administered medications for this visit.     Past Medical History  Diagnosis Date  . Chronic combined systolic and diastolic heart failure [facility]) November 2014  . Obesity   . Pulmonary hypertension (HCC)   . Hyperbilirubinemia   . Umbilical hernia   . Ischemic cardiomyopathy     a. echo (10/15):  EF 20%, diff HK, apical AK, mobile density inf wall near papillary muscle (?ruptured) with  mod MR, mod reduced RVSF, mild PI, PASP 37 mmHg b. s/p CRTD   . CAD (coronary artery disease)     a. s/p CABG x 3 03/2013 (L-LAD, S-RI, S-PDA) in [place]  . LV (left ventricular) mural thrombus (HCC)      a. TEE (10/15):  EF 15-20%, septum and ant walls AK, lat wall HK, no papillary muscle rupture, probable small thrombus on ant/ant-septal wall, mild MR, mod decreased RVSF, neg bubble study  . NSVT (nonsustained ventricular tachycardia) (HCC)         . Mitral regurgitation      mild by TEE 02/2014  . LBBB (left bundle branch block)    . HLD (hyperlipidemia)    . Pacemaker   . Coronary artery disease   . CAP (community acquired pneumonia) 08/22/2015  . AICD (automatic cardioverter/defibrillator) present   . CHF (congestive heart failure) (HCC)   . Atrial fibrillation, chronic (HCC)     ROS:   All systems reviewed and negative except as noted in the HPI.   Past Surgical History  Procedure Laterality Date  . Cardiac catheterization  03/2013  . Coronary artery bypass graft  03/2013    LIMA to the LAD, SVG to the ramus, SVG to the PDA  . Tee without cardioversion N/A 03/04/2014    Procedure:  TRANSESOPHAGEAL ECHOCARDIOGRAM (TEE);  Surgeon: Laurey Moralealton S McLean, MD;  [facility];  Service: Cardiovascular;  Laterality: N/A;  . Bi-ventricular implantable cardioverter defibrillator N/A 09/01/2014    MDT CRTD implanted by Dr Ladona Ridgelaylor  . Coronary artery bypass graft  2014    CABG "X3"  . Patella fracture surgery Left 1972  . Fracture surgery    . Cardiac catheterization  2014  . Cardiac defibrillator placement  2016  . Insert / replace / remove pacemaker  2016     Family History  Problem Relation Age of Onset  . Diabetes Mother     Deceased  . Thyroid disease Mother      Social History   Social History  . Marital Status: Divorced    Spouse Name: N/A  . Number of Children: N/A  . Years of Education: N/A   Occupational History  . Not on file.   Social History Main Topics   . Smoking status: Current Every Day Smoker -- 0.50 packs/day for 45 years    Types: Cigarettes  . Smokeless tobacco: Never Used     Comment: DOWN TO 1 CIGARETTE A DAY  . Alcohol Use: No  . Drug Use: No     Comment: 03/01/2014 "used last ~ 2 months ago"  . Sexual Activity: Not Currently   Other Topics Concern  . Not on file   Social History Narrative   ** Merged History Encounter **         BP 104/62 mmHg  Pulse 89  Ht 5\' 10"  (1.778 m)  Wt 218 lb 1.9 oz (98.939 kg)  BMI 31.30 kg/m2  SpO2 96%  Physical Exam:  Well appearing middle-aged man, NAD HEENT: Unremarkable Neck:  7 cm JVD, no thyromegally Back:  No CVA tenderness Lungs:  Clear, with no wheezes, rales, or rhonchi. HEART:  Regular rate rhythm, no murmurs, no rubs, no clicks Abd:  soft, positive bowel sounds, no organomegally, no rebound, no guarding Ext:  2 plus pulses, no edema, no cyanosis, no clubbing Skin:  No rashes no nodules Neuro:  CN II through XII intact, motor grossly intact  ICD - his device is working normally. Will recheck in several months   Assess/Plan: 1. Chronic systolic heart failure - he will continue his current meds. 2. ICD - his BiV ICD is working normally. Will recheck in several months. 3. CAD - he denies anginal symptoms. Will follow.   Leonia ReevesGregg Gladyse Corvin,M.D.

## 2015-10-18 ENCOUNTER — Other Ambulatory Visit (INDEPENDENT_AMBULATORY_CARE_PROVIDER_SITE_OTHER): Payer: Medicaid Other

## 2015-10-18 DIAGNOSIS — Z1211 Encounter for screening for malignant neoplasm of colon: Secondary | ICD-10-CM

## 2015-10-18 LAB — POC HEMOCCULT BLD/STL (HOME/3-CARD/SCREEN)
FECAL OCCULT BLD: NEGATIVE
FECAL OCCULT BLD: POSITIVE
Fecal Occult Blood, POC: NEGATIVE

## 2015-10-20 ENCOUNTER — Telehealth: Payer: Self-pay | Admitting: Internal Medicine

## 2015-10-20 NOTE — Telephone Encounter (Signed)
I spoke with the patient regarding his recently positive home FOBT. I informed him that the next step would typically be a colonoscopy. He denied any unintended weight loss, blood in stool, and changed in stool caliber, he said he would like an appointment in a month in our clinic after he has thought about getting a colonoscopy. I informed him that a colonoscopy could potentially treat a cancer early before it becomes unmanageable and deadly.

## 2015-10-27 ENCOUNTER — Ambulatory Visit (INDEPENDENT_AMBULATORY_CARE_PROVIDER_SITE_OTHER): Payer: Medicaid Other

## 2015-10-27 DIAGNOSIS — I213 ST elevation (STEMI) myocardial infarction of unspecified site: Secondary | ICD-10-CM | POA: Diagnosis not present

## 2015-10-27 DIAGNOSIS — Z5181 Encounter for therapeutic drug level monitoring: Secondary | ICD-10-CM

## 2015-10-27 DIAGNOSIS — I513 Intracardiac thrombosis, not elsewhere classified: Secondary | ICD-10-CM

## 2015-10-27 LAB — POCT INR: INR: 1.8

## 2015-11-24 ENCOUNTER — Ambulatory Visit (INDEPENDENT_AMBULATORY_CARE_PROVIDER_SITE_OTHER): Payer: Medicaid Other | Admitting: *Deleted

## 2015-11-24 DIAGNOSIS — I513 Intracardiac thrombosis, not elsewhere classified: Secondary | ICD-10-CM

## 2015-11-24 DIAGNOSIS — Z5181 Encounter for therapeutic drug level monitoring: Secondary | ICD-10-CM

## 2015-11-24 DIAGNOSIS — I213 ST elevation (STEMI) myocardial infarction of unspecified site: Secondary | ICD-10-CM

## 2015-11-24 LAB — POCT INR: INR: 2.5

## 2015-11-30 ENCOUNTER — Encounter: Payer: Self-pay | Admitting: Internal Medicine

## 2015-12-04 ENCOUNTER — Other Ambulatory Visit: Payer: Self-pay | Admitting: Internal Medicine

## 2015-12-26 ENCOUNTER — Ambulatory Visit (INDEPENDENT_AMBULATORY_CARE_PROVIDER_SITE_OTHER): Payer: Medicaid Other | Admitting: *Deleted

## 2015-12-26 DIAGNOSIS — Z5181 Encounter for therapeutic drug level monitoring: Secondary | ICD-10-CM | POA: Diagnosis not present

## 2015-12-26 DIAGNOSIS — I213 ST elevation (STEMI) myocardial infarction of unspecified site: Secondary | ICD-10-CM | POA: Diagnosis not present

## 2015-12-26 DIAGNOSIS — I513 Intracardiac thrombosis, not elsewhere classified: Secondary | ICD-10-CM

## 2015-12-26 LAB — POCT INR: INR: 3

## 2016-01-03 ENCOUNTER — Encounter: Payer: Medicaid Other | Admitting: *Deleted

## 2016-01-03 ENCOUNTER — Telehealth: Payer: Self-pay | Admitting: Cardiology

## 2016-01-03 NOTE — Telephone Encounter (Signed)
Spoke with pt and reminded pt of remote transmission that is due today. Pt verbalized understanding.   

## 2016-01-05 ENCOUNTER — Encounter: Payer: Self-pay | Admitting: Cardiology

## 2016-01-05 NOTE — Progress Notes (Signed)
Letter  

## 2016-01-16 ENCOUNTER — Ambulatory Visit (INDEPENDENT_AMBULATORY_CARE_PROVIDER_SITE_OTHER): Payer: Medicaid Other | Admitting: *Deleted

## 2016-01-16 DIAGNOSIS — I255 Ischemic cardiomyopathy: Secondary | ICD-10-CM | POA: Diagnosis not present

## 2016-01-16 DIAGNOSIS — Z95810 Presence of automatic (implantable) cardiac defibrillator: Secondary | ICD-10-CM

## 2016-01-17 NOTE — Progress Notes (Signed)
Remote ICD transmission.   

## 2016-01-18 ENCOUNTER — Encounter: Payer: Self-pay | Admitting: Cardiology

## 2016-01-24 ENCOUNTER — Ambulatory Visit (INDEPENDENT_AMBULATORY_CARE_PROVIDER_SITE_OTHER): Payer: Medicaid Other | Admitting: *Deleted

## 2016-01-24 DIAGNOSIS — Z5181 Encounter for therapeutic drug level monitoring: Secondary | ICD-10-CM

## 2016-01-24 DIAGNOSIS — I213 ST elevation (STEMI) myocardial infarction of unspecified site: Secondary | ICD-10-CM | POA: Diagnosis not present

## 2016-01-24 DIAGNOSIS — I513 Intracardiac thrombosis, not elsewhere classified: Secondary | ICD-10-CM

## 2016-01-24 LAB — POCT INR: INR: 2.4

## 2016-01-29 LAB — CUP PACEART REMOTE DEVICE CHECK
Battery Remaining Longevity: 79 mo
Brady Statistic AP VP Percent: 16.39 %
Brady Statistic AS VP Percent: 76.32 %
Brady Statistic AS VS Percent: 7.04 %
Brady Statistic RV Percent Paced: 90.09 %
Date Time Interrogation Session: 20170829111438
HighPow Impedance: 85 Ohm
Implantable Lead Implant Date: 20160414
Implantable Lead Implant Date: 20160414
Implantable Lead Location: 753859
Implantable Lead Model: 4398
Implantable Lead Model: 5076
Implantable Lead Model: 6935
Lead Channel Impedance Value: 418 Ohm
Lead Channel Impedance Value: 475 Ohm
Lead Channel Impedance Value: 475 Ohm
Lead Channel Impedance Value: 513 Ohm
Lead Channel Impedance Value: 513 Ohm
Lead Channel Impedance Value: 760 Ohm
Lead Channel Impedance Value: 779 Ohm
Lead Channel Pacing Threshold Pulse Width: 0.4 ms
Lead Channel Sensing Intrinsic Amplitude: 0.375 mV
Lead Channel Sensing Intrinsic Amplitude: 23 mV
Lead Channel Sensing Intrinsic Amplitude: 23 mV
Lead Channel Setting Pacing Amplitude: 1.5 V
Lead Channel Setting Pacing Amplitude: 2.5 V
Lead Channel Setting Pacing Pulse Width: 0.03 ms
Lead Channel Setting Pacing Pulse Width: 0.8 ms
MDC IDC LEAD IMPLANT DT: 20160414
MDC IDC LEAD LOCATION: 753858
MDC IDC LEAD LOCATION: 753860
MDC IDC MSMT BATTERY VOLTAGE: 2.98 V
MDC IDC MSMT LEADCHNL LV IMPEDANCE VALUE: 532 Ohm
MDC IDC MSMT LEADCHNL LV IMPEDANCE VALUE: 817 Ohm
MDC IDC MSMT LEADCHNL LV IMPEDANCE VALUE: 874 Ohm
MDC IDC MSMT LEADCHNL LV IMPEDANCE VALUE: 874 Ohm
MDC IDC MSMT LEADCHNL RA IMPEDANCE VALUE: 513 Ohm
MDC IDC MSMT LEADCHNL RA PACING THRESHOLD AMPLITUDE: 0.75 V
MDC IDC MSMT LEADCHNL RA SENSING INTR AMPL: 0.375 mV
MDC IDC MSMT LEADCHNL RV IMPEDANCE VALUE: 361 Ohm
MDC IDC SET LEADCHNL RV PACING AMPLITUDE: 0.5 V
MDC IDC SET LEADCHNL RV SENSING SENSITIVITY: 0.3 mV
MDC IDC STAT BRADY AP VS PERCENT: 0.25 %
MDC IDC STAT BRADY RA PERCENT PACED: 16.64 %

## 2016-02-02 ENCOUNTER — Encounter: Payer: Self-pay | Admitting: Cardiology

## 2016-02-05 ENCOUNTER — Other Ambulatory Visit: Payer: Self-pay | Admitting: Internal Medicine

## 2016-02-05 ENCOUNTER — Other Ambulatory Visit: Payer: Self-pay | Admitting: Cardiology

## 2016-03-06 ENCOUNTER — Ambulatory Visit (INDEPENDENT_AMBULATORY_CARE_PROVIDER_SITE_OTHER): Payer: Medicaid Other | Admitting: Pharmacist

## 2016-03-06 DIAGNOSIS — I513 Intracardiac thrombosis, not elsewhere classified: Secondary | ICD-10-CM | POA: Diagnosis not present

## 2016-03-06 DIAGNOSIS — I213 ST elevation (STEMI) myocardial infarction of unspecified site: Secondary | ICD-10-CM | POA: Diagnosis not present

## 2016-03-06 DIAGNOSIS — Z5181 Encounter for therapeutic drug level monitoring: Secondary | ICD-10-CM

## 2016-03-06 LAB — POCT INR: INR: 2.3

## 2016-04-02 ENCOUNTER — Telehealth: Payer: Self-pay | Admitting: Licensed Clinical Social Worker

## 2016-04-02 NOTE — Telephone Encounter (Signed)
CSW received call from patient requesting assistance with insurance options [place]. Patient reports [place] SSD and his income exceeds the medicaid limit. Patient concerned about insurance and obtaining medications going forward. CSW encouraged patient to follow up with medicare to determine when he will be eligible for medicare. Patient also instructed to apply for AHA through the website for insurance options until Medicare option is available. Patient also instructed to pick up discount card for medications and follow up with MD office for possible samples if needed. Patient verbalizes understanding of follow up needed and will return call to CSW if needed. Lasandra BeechJackie Floria Brandau, LCSW [phone]

## 2016-04-15 ENCOUNTER — Telehealth: Payer: Self-pay | Admitting: Licensed Clinical Social Worker

## 2016-04-15 NOTE — Telephone Encounter (Signed)
Patient called to inform CSW that he applied for AHA and the cost was $1700 monthly which is more than his monthly income. Patient overwhelmed with concern about future medical needs and affording costs. Patient will meet with CSW tomorrow in the office to discuss further options. Lasandra BeechJackie Rahn Lacuesta, LCSW [phone]

## 2016-04-16 ENCOUNTER — Ambulatory Visit (INDEPENDENT_AMBULATORY_CARE_PROVIDER_SITE_OTHER): Payer: Medicaid Other | Admitting: *Deleted

## 2016-04-16 ENCOUNTER — Encounter: Payer: Self-pay | Admitting: Licensed Clinical Social Worker

## 2016-04-16 DIAGNOSIS — Z5181 Encounter for therapeutic drug level monitoring: Secondary | ICD-10-CM | POA: Diagnosis not present

## 2016-04-16 LAB — POCT INR: INR: 2.5

## 2016-04-16 NOTE — Progress Notes (Signed)
Patient requested to meet with CSW to discuss options for insurance. Patient reports [facility] although monthly premiums exceeded his monthly [facility]. Patient very frustrated as his medicaid has been terminated due to over income limits and the AHA he is unable to afford. Patient reports he should become eligible for Medicare in April 2018. CSW discussed Medicaid deductible program and [facility]. CSW left message for [facility]. Patient verbalizes understanding of options and will follow up with medicaid worker as well. CSW continues to be available for assistance. Lasandra BeechJackie Ka Flammer, LCSW [phone]

## 2016-04-17 ENCOUNTER — Ambulatory Visit (INDEPENDENT_AMBULATORY_CARE_PROVIDER_SITE_OTHER): Payer: Medicaid Other | Admitting: *Deleted

## 2016-04-17 DIAGNOSIS — I255 Ischemic cardiomyopathy: Secondary | ICD-10-CM

## 2016-04-17 NOTE — Progress Notes (Signed)
Remote ICD transmission.   

## 2016-04-26 ENCOUNTER — Encounter: Payer: Self-pay | Admitting: Cardiology

## 2016-05-10 ENCOUNTER — Encounter: Payer: Self-pay | Admitting: Cardiology

## 2016-05-21 LAB — CUP PACEART REMOTE DEVICE CHECK
Brady Statistic AS VP Percent: 70.16 %
Brady Statistic AS VS Percent: 6.47 %
HighPow Impedance: 82 Ohm
Implantable Lead Implant Date: 20160414
Implantable Lead Implant Date: 20160414
Implantable Lead Location: 753858
Implantable Lead Location: 753859
Implantable Lead Location: 753860
Implantable Lead Model: 5076
Implantable Lead Model: 6935
Lead Channel Impedance Value: 456 Ohm
Lead Channel Impedance Value: 456 Ohm
Lead Channel Impedance Value: 475 Ohm
Lead Channel Impedance Value: 513 Ohm
Lead Channel Impedance Value: 513 Ohm
Lead Channel Impedance Value: 817 Ohm
Lead Channel Sensing Intrinsic Amplitude: 0.5 mV
Lead Channel Sensing Intrinsic Amplitude: 14 mV
Lead Channel Setting Pacing Amplitude: 0.5 V
Lead Channel Setting Pacing Amplitude: 2.5 V
MDC IDC LEAD IMPLANT DT: 20160414
MDC IDC LEAD MODEL: 4398
MDC IDC MSMT BATTERY REMAINING LONGEVITY: 75 mo
MDC IDC MSMT BATTERY VOLTAGE: 2.98 V
MDC IDC MSMT LEADCHNL LV IMPEDANCE VALUE: 532 Ohm
MDC IDC MSMT LEADCHNL LV IMPEDANCE VALUE: 551 Ohm
MDC IDC MSMT LEADCHNL LV IMPEDANCE VALUE: 779 Ohm
MDC IDC MSMT LEADCHNL LV IMPEDANCE VALUE: 817 Ohm
MDC IDC MSMT LEADCHNL LV IMPEDANCE VALUE: 874 Ohm
MDC IDC MSMT LEADCHNL LV IMPEDANCE VALUE: 874 Ohm
MDC IDC MSMT LEADCHNL RA PACING THRESHOLD AMPLITUDE: 0.625 V
MDC IDC MSMT LEADCHNL RA PACING THRESHOLD PULSEWIDTH: 0.4 ms
MDC IDC MSMT LEADCHNL RA SENSING INTR AMPL: 0.5 mV
MDC IDC MSMT LEADCHNL RV IMPEDANCE VALUE: 418 Ohm
MDC IDC MSMT LEADCHNL RV SENSING INTR AMPL: 14 mV
MDC IDC PG IMPLANT DT: 20160414
MDC IDC SESS DTM: 20171127162917
MDC IDC SET LEADCHNL LV PACING PULSEWIDTH: 0.8 ms
MDC IDC SET LEADCHNL RA PACING AMPLITUDE: 1.5 V
MDC IDC SET LEADCHNL RV PACING PULSEWIDTH: 0.03 ms
MDC IDC SET LEADCHNL RV SENSING SENSITIVITY: 0.3 mV
MDC IDC STAT BRADY AP VP PERCENT: 23.06 %
MDC IDC STAT BRADY AP VS PERCENT: 0.3 %
MDC IDC STAT BRADY RA PERCENT PACED: 23.36 %
MDC IDC STAT BRADY RV PERCENT PACED: 89.8 %

## 2016-05-29 ENCOUNTER — Ambulatory Visit (INDEPENDENT_AMBULATORY_CARE_PROVIDER_SITE_OTHER): Payer: Self-pay | Admitting: *Deleted

## 2016-05-29 DIAGNOSIS — Z5181 Encounter for therapeutic drug level monitoring: Secondary | ICD-10-CM

## 2016-05-29 DIAGNOSIS — I513 Intracardiac thrombosis, not elsewhere classified: Secondary | ICD-10-CM

## 2016-05-29 LAB — POCT INR: INR: 2.4

## 2016-07-08 ENCOUNTER — Other Ambulatory Visit: Payer: Self-pay | Admitting: Internal Medicine

## 2016-07-10 ENCOUNTER — Ambulatory Visit (INDEPENDENT_AMBULATORY_CARE_PROVIDER_SITE_OTHER): Payer: Self-pay | Admitting: *Deleted

## 2016-07-10 DIAGNOSIS — Z5181 Encounter for therapeutic drug level monitoring: Secondary | ICD-10-CM

## 2016-07-10 DIAGNOSIS — I513 Intracardiac thrombosis, not elsewhere classified: Secondary | ICD-10-CM

## 2016-07-10 LAB — POCT INR: INR: 3

## 2016-07-17 ENCOUNTER — Telehealth: Payer: Self-pay | Admitting: Cardiology

## 2016-07-17 ENCOUNTER — Ambulatory Visit (INDEPENDENT_AMBULATORY_CARE_PROVIDER_SITE_OTHER): Payer: Self-pay | Admitting: *Deleted

## 2016-07-17 DIAGNOSIS — I255 Ischemic cardiomyopathy: Secondary | ICD-10-CM

## 2016-07-17 NOTE — Telephone Encounter (Signed)
Spoke with pt and reminded pt of remote transmission that is due today. Pt verbalized understanding.   

## 2016-07-18 ENCOUNTER — Encounter: Payer: Self-pay | Admitting: Cardiology

## 2016-07-18 NOTE — Progress Notes (Signed)
Remote ICD transmission.   

## 2016-07-19 LAB — CUP PACEART REMOTE DEVICE CHECK
Battery Remaining Longevity: 68 mo
Battery Voltage: 2.97 V
Brady Statistic AP VP Percent: 23.77 %
Brady Statistic AP VS Percent: 0.53 %
Brady Statistic AS VS Percent: 7.13 %
HighPow Impedance: 83 Ohm
Implantable Lead Implant Date: 20160414
Implantable Lead Implant Date: 20160414
Implantable Lead Location: 753859
Implantable Lead Location: 753860
Implantable Lead Model: 4398
Implantable Lead Model: 5076
Implantable Pulse Generator Implant Date: 20160414
Lead Channel Impedance Value: 456 Ohm
Lead Channel Impedance Value: 475 Ohm
Lead Channel Impedance Value: 513 Ohm
Lead Channel Impedance Value: 513 Ohm
Lead Channel Impedance Value: 779 Ohm
Lead Channel Impedance Value: 817 Ohm
Lead Channel Impedance Value: 836 Ohm
Lead Channel Impedance Value: 874 Ohm
Lead Channel Impedance Value: 874 Ohm
Lead Channel Pacing Threshold Pulse Width: 0.4 ms
Lead Channel Sensing Intrinsic Amplitude: 0.875 mV
Lead Channel Sensing Intrinsic Amplitude: 0.875 mV
Lead Channel Setting Pacing Amplitude: 0.5 V
Lead Channel Setting Pacing Amplitude: 1.5 V
Lead Channel Setting Pacing Pulse Width: 0.03 ms
Lead Channel Setting Sensing Sensitivity: 0.3 mV
MDC IDC LEAD IMPLANT DT: 20160414
MDC IDC LEAD LOCATION: 753858
MDC IDC MSMT LEADCHNL LV IMPEDANCE VALUE: 456 Ohm
MDC IDC MSMT LEADCHNL LV IMPEDANCE VALUE: 513 Ohm
MDC IDC MSMT LEADCHNL LV IMPEDANCE VALUE: 532 Ohm
MDC IDC MSMT LEADCHNL RA PACING THRESHOLD AMPLITUDE: 0.625 V
MDC IDC MSMT LEADCHNL RV IMPEDANCE VALUE: 342 Ohm
MDC IDC MSMT LEADCHNL RV SENSING INTR AMPL: 11.5 mV
MDC IDC MSMT LEADCHNL RV SENSING INTR AMPL: 11.5 mV
MDC IDC SESS DTM: 20180301023325
MDC IDC SET LEADCHNL LV PACING AMPLITUDE: 2.5 V
MDC IDC SET LEADCHNL LV PACING PULSEWIDTH: 0.8 ms
MDC IDC STAT BRADY AS VP PERCENT: 68.58 %
MDC IDC STAT BRADY RA PERCENT PACED: 22.96 %
MDC IDC STAT BRADY RV PERCENT PACED: 87.06 %

## 2016-08-01 ENCOUNTER — Encounter: Payer: Self-pay | Admitting: Cardiology

## 2016-08-05 ENCOUNTER — Other Ambulatory Visit: Payer: Self-pay | Admitting: Internal Medicine

## 2016-08-05 ENCOUNTER — Other Ambulatory Visit: Payer: Self-pay | Admitting: Cardiology

## 2016-08-21 ENCOUNTER — Ambulatory Visit (INDEPENDENT_AMBULATORY_CARE_PROVIDER_SITE_OTHER): Payer: Medicare Other | Admitting: *Deleted

## 2016-08-21 DIAGNOSIS — Z5181 Encounter for therapeutic drug level monitoring: Secondary | ICD-10-CM | POA: Diagnosis not present

## 2016-08-21 DIAGNOSIS — I513 Intracardiac thrombosis, not elsewhere classified: Secondary | ICD-10-CM

## 2016-08-21 DIAGNOSIS — I255 Ischemic cardiomyopathy: Secondary | ICD-10-CM

## 2016-08-21 LAB — POCT INR: INR: 1.7

## 2016-09-11 ENCOUNTER — Ambulatory Visit (INDEPENDENT_AMBULATORY_CARE_PROVIDER_SITE_OTHER): Payer: Medicare Other | Admitting: *Deleted

## 2016-09-11 DIAGNOSIS — Z5181 Encounter for therapeutic drug level monitoring: Secondary | ICD-10-CM | POA: Diagnosis not present

## 2016-09-11 DIAGNOSIS — I255 Ischemic cardiomyopathy: Secondary | ICD-10-CM | POA: Diagnosis not present

## 2016-09-11 DIAGNOSIS — I513 Intracardiac thrombosis, not elsewhere classified: Secondary | ICD-10-CM

## 2016-09-11 LAB — POCT INR: INR: 2.3

## 2016-10-09 ENCOUNTER — Other Ambulatory Visit: Payer: Self-pay | Admitting: Internal Medicine

## 2016-10-11 ENCOUNTER — Ambulatory Visit (INDEPENDENT_AMBULATORY_CARE_PROVIDER_SITE_OTHER): Payer: Medicare Other | Admitting: Pharmacist

## 2016-10-11 DIAGNOSIS — Z5181 Encounter for therapeutic drug level monitoring: Secondary | ICD-10-CM | POA: Diagnosis not present

## 2016-10-11 DIAGNOSIS — I513 Intracardiac thrombosis, not elsewhere classified: Secondary | ICD-10-CM | POA: Diagnosis not present

## 2016-10-11 DIAGNOSIS — I255 Ischemic cardiomyopathy: Secondary | ICD-10-CM

## 2016-10-11 LAB — POCT INR: INR: 3.5

## 2016-11-06 ENCOUNTER — Ambulatory Visit (INDEPENDENT_AMBULATORY_CARE_PROVIDER_SITE_OTHER): Payer: Medicare Other

## 2016-11-06 DIAGNOSIS — I513 Intracardiac thrombosis, not elsewhere classified: Secondary | ICD-10-CM

## 2016-11-06 DIAGNOSIS — Z5181 Encounter for therapeutic drug level monitoring: Secondary | ICD-10-CM

## 2016-11-06 DIAGNOSIS — I255 Ischemic cardiomyopathy: Secondary | ICD-10-CM

## 2016-11-06 LAB — POCT INR: INR: 4.1

## 2016-11-18 ENCOUNTER — Encounter: Payer: Self-pay | Admitting: Internal Medicine

## 2016-11-18 ENCOUNTER — Ambulatory Visit (INDEPENDENT_AMBULATORY_CARE_PROVIDER_SITE_OTHER): Payer: Medicare Other | Admitting: *Deleted

## 2016-11-18 ENCOUNTER — Ambulatory Visit (INDEPENDENT_AMBULATORY_CARE_PROVIDER_SITE_OTHER): Payer: Medicare Other | Admitting: Internal Medicine

## 2016-11-18 VITALS — BP 100/64 | HR 65 | Ht 70.0 in | Wt 224.4 lb

## 2016-11-18 DIAGNOSIS — I240 Acute coronary thrombosis not resulting in myocardial infarction: Secondary | ICD-10-CM

## 2016-11-18 DIAGNOSIS — I255 Ischemic cardiomyopathy: Secondary | ICD-10-CM

## 2016-11-18 DIAGNOSIS — Z5181 Encounter for therapeutic drug level monitoring: Secondary | ICD-10-CM

## 2016-11-18 DIAGNOSIS — I513 Intracardiac thrombosis, not elsewhere classified: Secondary | ICD-10-CM | POA: Diagnosis not present

## 2016-11-18 DIAGNOSIS — I5022 Chronic systolic (congestive) heart failure: Secondary | ICD-10-CM

## 2016-11-18 DIAGNOSIS — I2589 Other forms of chronic ischemic heart disease: Secondary | ICD-10-CM

## 2016-11-18 LAB — PROTIME-INR
INR: >10 (ref 0.8–1.2)
INR: 6.69
PROTHROMBIN TIME: 60.4 s — AB (ref 11.4–15.2)
Prothrombin Time: 108 s — ABNORMAL HIGH (ref 9.1–12.0)

## 2016-11-18 LAB — POCT INR: INR: 8

## 2016-11-18 NOTE — Patient Instructions (Addendum)
Medication Instructions:  Your physician recommends that you continue on your current medications as directed. Please refer to the Current Medication list given to you today.  Labwork: None ordered.  Testing/Procedures: None ordered.  Follow-Up: Your physician wants you to follow-up in: one year with Dr. Ladona Ridgelaylor.  You will receive a reminder letter in the mail two months in advance. If you don't receive a letter, please call our office to schedule the follow-up appointment.  Device check will be 02/17/2017.  Any Other Special Instructions Will Be Listed Below (If Applicable).  If you need a refill on your cardiac medications before your next appointment, please call your pharmacy.

## 2016-11-18 NOTE — Progress Notes (Signed)
HPI Joseph Carson returns today for followup. He has longstanding chronic systolic heart failure, and LBBB and underwent Biv ICD implant 2 years ago. In the interim he has done well.  He has had no edema, syncope or ICD shocks. No chest pain. His CHF is class 2. He is still smoking but down to a half pack a day. No Known Allergies   Current Outpatient Prescriptions  Medication Sig Dispense Refill  . acetaminophen (TYLENOL) 325 MG tablet Take 1-2 tablets (325-650 mg total) by mouth every 4 (four) hours as needed for mild pain.    Marland Kitchen atorvastatin (LIPITOR) 40 MG tablet Take 1 tablet (40 mg total) by mouth daily. 90 tablet 3  . carvedilol (COREG) 6.25 MG tablet TAKE 2 TABLETS TWICE DAILY WITH MEAL. 120 tablet 1  . furosemide (LASIX) 40 MG tablet TAKE 1 TABLET ONCE DAILY. 30 tablet 10  . Multiple Vitamins-Minerals (HAIR/SKIN/NAILS PO) Take 1 tablet by mouth daily.    Marland Kitchen spironolactone (ALDACTONE) 25 MG tablet TAKE 1 TABLET ONCE DAILY. 30 tablet 1  . warfarin (COUMADIN) 5 MG tablet TAKE 1 TABLET DAILY, EXCEPT TAKE 1&1/2 TABLETS ON SUNDAY (OR AS DIRECTED BY COUMADIN CLINIC) 40 tablet 3   No current facility-administered medications for this visit.      Past Medical History:  Diagnosis Date  . AICD (automatic cardioverter/defibrillator) present   . Atrial fibrillation, chronic (HCC)   . CAD (coronary artery disease)    a. s/p CABG x 3 03/2013 (L-LAD, S-RI, S-PDA) in [place]  . CAP (community acquired pneumonia) 08/22/2015  . CHF (congestive heart failure) (HCC)   . Chronic combined systolic and diastolic heart failure [facility]) November 2014  . Coronary artery disease   . HLD (hyperlipidemia)    . Hyperbilirubinemia   . Ischemic cardiomyopathy    a. echo (10/15):  EF 20%, diff HK, apical AK, mobile density inf wall near papillary muscle (?ruptured) with mod MR, mod reduced RVSF, mild PI, PASP 37 mmHg b. s/p CRTD   . LBBB (left bundle branch block)    . LV (left ventricular) mural thrombus     a. TEE (10/15):  EF 15-20%, septum and ant walls AK, lat wall HK, no papillary muscle rupture, probable small thrombus on ant/ant-septal wall, mild MR, mod decreased RVSF, neg bubble study  . Mitral regurgitation     mild by TEE 02/2014  . NSVT (nonsustained ventricular tachycardia) (HCC)        . Obesity   . Pacemaker   . Pulmonary hypertension (HCC)   . Umbilical hernia     ROS:   All systems reviewed and negative except as noted in the HPI.   Past Surgical History:  Procedure Laterality Date  . BI-VENTRICULAR IMPLANTABLE CARDIOVERTER DEFIBRILLATOR N/A 09/01/2014   MDT CRTD implanted by Dr Ladona Ridgel  . CARDIAC CATHETERIZATION  03/2013  . CARDIAC CATHETERIZATION  2014  . CARDIAC DEFIBRILLATOR PLACEMENT  2016  . CORONARY ARTERY BYPASS GRAFT  03/2013   LIMA to the LAD, SVG to the ramus, SVG to the PDA  . CORONARY ARTERY BYPASS GRAFT  2014   CABG "X3"  . FRACTURE SURGERY    . INSERT / REPLACE / REMOVE PACEMAKER  2016  . PATELLA FRACTURE SURGERY Left 1972  . TEE WITHOUT CARDIOVERSION N/A 03/04/2014   Procedure: TRANSESOPHAGEAL ECHOCARDIOGRAM (TEE);  Surgeon: Laurey Morale, MD;  Location: [facility] ENDOSCOPY;  Service: Cardiovascular;  Laterality: N/A;     Family History  Problem Relation Age  of Onset  . Diabetes Mother        Deceased  . Thyroid disease Mother      Social History   Social History  . Marital status: Divorced    Spouse name: N/A  . Number of children: N/A  . Years of education: N/A   Occupational History  . Not on file.   Social History Main Topics  . Smoking status: Current Every Day Smoker    Packs/day: 0.50    Years: 45.00    Types: Cigarettes  . Smokeless tobacco: Never Used     Comment: DOWN TO 1 CIGARETTE A DAY  . Alcohol use No  . Drug use: No     Comment: 03/01/2014 "used last ~ 2 months ago"  . Sexual activity: Not Currently   Other Topics Concern  . Not on file   Social History Narrative   ** Merged History Encounter **          BP 100/64   Pulse 65   Ht 5\' 10"  (1.778 m)   Wt 224 lb 6.4 oz (101.8 kg)   BMI 32.20 kg/m   Physical Exam:  Well appearing middle-aged man, NAD HEENT: Unremarkable Neck:  6 cm JVD, no thyromegally Back:  No CVA tenderness Lungs:  Clear, with no wheezes, rales, or rhonchi.well healed ICD incision HEART:  Regular rate rhythm, no murmurs, no rubs, no clicks Abd:  soft, positive bowel sounds, no organomegally, no rebound, no guarding Ext:  2 plus pulses, no edema, no cyanosis, no clubbing Skin:  No rashes no nodules Neuro:  CN II through XII intact, motor grossly intact  ICD - his device is working normally. Will recheck in several months   Assess/Plan: 1. Chronic systolic heart failure - he will continue his current meds. 2. ICD - his BiV ICD is working normally. Will recheck in several months. 3. CAD - he denies anginal symptoms. Will follow.  4. Tobacco abuse - I have encouraged the patient to stop smoking.   Leonia ReevesGregg Carson,M.D.

## 2016-11-19 LAB — CUP PACEART INCLINIC DEVICE CHECK
Battery Remaining Longevity: 58 mo
Battery Voltage: 2.97 V
Brady Statistic AP VP Percent: 22.21 %
Brady Statistic AP VS Percent: 0.42 %
Brady Statistic AS VS Percent: 7.22 %
Brady Statistic RA Percent Paced: 21.45 %
HIGH POWER IMPEDANCE MEASURED VALUE: 72 Ohm
Implantable Lead Implant Date: 20160414
Implantable Lead Implant Date: 20160414
Implantable Lead Location: 753859
Implantable Lead Location: 753860
Implantable Lead Model: 4398
Implantable Lead Model: 5076
Implantable Lead Model: 6935
Lead Channel Impedance Value: 361 Ohm
Lead Channel Impedance Value: 418 Ohm
Lead Channel Impedance Value: 418 Ohm
Lead Channel Impedance Value: 456 Ohm
Lead Channel Impedance Value: 475 Ohm
Lead Channel Impedance Value: 475 Ohm
Lead Channel Impedance Value: 532 Ohm
Lead Channel Impedance Value: 760 Ohm
Lead Channel Impedance Value: 779 Ohm
Lead Channel Impedance Value: 817 Ohm
Lead Channel Pacing Threshold Amplitude: 0.75 V
Lead Channel Sensing Intrinsic Amplitude: 0.5 mV
Lead Channel Sensing Intrinsic Amplitude: 0.75 mV
Lead Channel Sensing Intrinsic Amplitude: 13.375 mV
Lead Channel Setting Pacing Amplitude: 0.5 V
Lead Channel Setting Pacing Amplitude: 1.5 V
Lead Channel Setting Pacing Amplitude: 2.5 V
Lead Channel Setting Pacing Pulse Width: 0.03 ms
Lead Channel Setting Sensing Sensitivity: 0.3 mV
MDC IDC LEAD IMPLANT DT: 20160414
MDC IDC LEAD LOCATION: 753858
MDC IDC MSMT LEADCHNL LV IMPEDANCE VALUE: 475 Ohm
MDC IDC MSMT LEADCHNL LV IMPEDANCE VALUE: 779 Ohm
MDC IDC MSMT LEADCHNL LV IMPEDANCE VALUE: 779 Ohm
MDC IDC MSMT LEADCHNL RA PACING THRESHOLD PULSEWIDTH: 0.4 ms
MDC IDC MSMT LEADCHNL RV SENSING INTR AMPL: 18.125 mV
MDC IDC PG IMPLANT DT: 20160414
MDC IDC SESS DTM: 20180702124243
MDC IDC SET LEADCHNL LV PACING PULSEWIDTH: 0.8 ms
MDC IDC STAT BRADY AS VP PERCENT: 70.16 %
MDC IDC STAT BRADY RV PERCENT PACED: 87.78 %

## 2016-11-22 ENCOUNTER — Ambulatory Visit (INDEPENDENT_AMBULATORY_CARE_PROVIDER_SITE_OTHER): Payer: Medicare Other | Admitting: Pharmacist

## 2016-11-22 DIAGNOSIS — Z5181 Encounter for therapeutic drug level monitoring: Secondary | ICD-10-CM

## 2016-11-22 DIAGNOSIS — I255 Ischemic cardiomyopathy: Secondary | ICD-10-CM

## 2016-11-22 DIAGNOSIS — I513 Intracardiac thrombosis, not elsewhere classified: Secondary | ICD-10-CM

## 2016-11-22 LAB — POCT INR: INR: 1.6

## 2016-11-29 ENCOUNTER — Ambulatory Visit (INDEPENDENT_AMBULATORY_CARE_PROVIDER_SITE_OTHER): Payer: Medicare Other

## 2016-11-29 DIAGNOSIS — Z5181 Encounter for therapeutic drug level monitoring: Secondary | ICD-10-CM | POA: Diagnosis not present

## 2016-11-29 DIAGNOSIS — I513 Intracardiac thrombosis, not elsewhere classified: Secondary | ICD-10-CM | POA: Diagnosis not present

## 2016-11-29 DIAGNOSIS — I255 Ischemic cardiomyopathy: Secondary | ICD-10-CM

## 2016-11-29 LAB — POCT INR: INR: 2.4

## 2016-12-05 ENCOUNTER — Other Ambulatory Visit: Payer: Self-pay | Admitting: Internal Medicine

## 2016-12-09 ENCOUNTER — Ambulatory Visit (INDEPENDENT_AMBULATORY_CARE_PROVIDER_SITE_OTHER): Payer: Medicare Other | Admitting: *Deleted

## 2016-12-09 DIAGNOSIS — Z5181 Encounter for therapeutic drug level monitoring: Secondary | ICD-10-CM | POA: Diagnosis not present

## 2016-12-09 DIAGNOSIS — I513 Intracardiac thrombosis, not elsewhere classified: Secondary | ICD-10-CM | POA: Diagnosis not present

## 2016-12-09 DIAGNOSIS — I255 Ischemic cardiomyopathy: Secondary | ICD-10-CM

## 2016-12-09 LAB — POCT INR: INR: 2.5

## 2016-12-25 ENCOUNTER — Other Ambulatory Visit: Payer: Self-pay | Admitting: Internal Medicine

## 2016-12-25 MED ORDER — ATORVASTATIN CALCIUM 40 MG PO TABS: 40.0000 mg | ORAL_TABLET | Freq: Every day | ORAL | 3 refills | Status: DC

## 2016-12-25 MED ORDER — SPIRONOLACTONE 25 MG PO TABS: 25.0000 mg | ORAL_TABLET | Freq: Every day | ORAL | 3 refills | Status: DC

## 2016-12-25 MED ORDER — CARVEDILOL 6.25 MG PO TABS: 12.5000 mg | ORAL_TABLET | Freq: Two times a day (BID) | ORAL | 3 refills | Status: DC

## 2017-01-03 ENCOUNTER — Ambulatory Visit (INDEPENDENT_AMBULATORY_CARE_PROVIDER_SITE_OTHER): Payer: Medicare Other

## 2017-01-03 DIAGNOSIS — I255 Ischemic cardiomyopathy: Secondary | ICD-10-CM

## 2017-01-03 DIAGNOSIS — Z5181 Encounter for therapeutic drug level monitoring: Secondary | ICD-10-CM

## 2017-01-03 DIAGNOSIS — I513 Intracardiac thrombosis, not elsewhere classified: Secondary | ICD-10-CM | POA: Diagnosis not present

## 2017-01-03 LAB — POCT INR: INR: 2.3

## 2017-01-31 ENCOUNTER — Ambulatory Visit (INDEPENDENT_AMBULATORY_CARE_PROVIDER_SITE_OTHER): Payer: Medicare Other

## 2017-01-31 DIAGNOSIS — I240 Acute coronary thrombosis not resulting in myocardial infarction: Secondary | ICD-10-CM

## 2017-01-31 DIAGNOSIS — Z5181 Encounter for therapeutic drug level monitoring: Secondary | ICD-10-CM | POA: Diagnosis not present

## 2017-01-31 DIAGNOSIS — I513 Intracardiac thrombosis, not elsewhere classified: Secondary | ICD-10-CM | POA: Diagnosis not present

## 2017-01-31 LAB — POCT INR: INR: 4.2

## 2017-02-11 ENCOUNTER — Other Ambulatory Visit: Payer: Self-pay | Admitting: Cardiology

## 2017-02-14 ENCOUNTER — Ambulatory Visit (INDEPENDENT_AMBULATORY_CARE_PROVIDER_SITE_OTHER): Payer: Medicare Other | Admitting: *Deleted

## 2017-02-14 DIAGNOSIS — Z95810 Presence of automatic (implantable) cardiac defibrillator: Secondary | ICD-10-CM | POA: Diagnosis not present

## 2017-02-14 DIAGNOSIS — I513 Intracardiac thrombosis, not elsewhere classified: Secondary | ICD-10-CM

## 2017-02-14 DIAGNOSIS — Z5181 Encounter for therapeutic drug level monitoring: Secondary | ICD-10-CM

## 2017-02-14 LAB — POCT INR: INR: 2

## 2017-02-17 ENCOUNTER — Ambulatory Visit (INDEPENDENT_AMBULATORY_CARE_PROVIDER_SITE_OTHER): Payer: Medicare Other | Admitting: *Deleted

## 2017-02-17 DIAGNOSIS — I5022 Chronic systolic (congestive) heart failure: Secondary | ICD-10-CM

## 2017-02-17 DIAGNOSIS — I255 Ischemic cardiomyopathy: Secondary | ICD-10-CM | POA: Diagnosis not present

## 2017-02-17 NOTE — Progress Notes (Signed)
Remote ICD transmission.   

## 2017-02-19 ENCOUNTER — Encounter: Payer: Self-pay | Admitting: Cardiology

## 2017-02-20 LAB — CUP PACEART REMOTE DEVICE CHECK
Battery Remaining Longevity: 56 mo
Battery Voltage: 2.97 V
Brady Statistic AP VS Percent: 0.15 %
Brady Statistic AS VP Percent: 72.23 %
Brady Statistic AS VS Percent: 8.67 %
Brady Statistic RA Percent Paced: 17.54 %
HighPow Impedance: 89 Ohm
Implantable Lead Implant Date: 20160414
Implantable Lead Location: 753859
Implantable Lead Location: 753860
Implantable Lead Model: 5076
Implantable Lead Model: 6935
Implantable Pulse Generator Implant Date: 20160414
Lead Channel Impedance Value: 418 Ohm
Lead Channel Impedance Value: 513 Ohm
Lead Channel Impedance Value: 513 Ohm
Lead Channel Impedance Value: 817 Ohm
Lead Channel Impedance Value: 817 Ohm
Lead Channel Impedance Value: 836 Ohm
Lead Channel Pacing Threshold Amplitude: 0.75 V
Lead Channel Sensing Intrinsic Amplitude: 0.75 mV
Lead Channel Sensing Intrinsic Amplitude: 0.75 mV
Lead Channel Sensing Intrinsic Amplitude: 14.5 mV
Lead Channel Setting Pacing Amplitude: 2.5 V
Lead Channel Setting Sensing Sensitivity: 0.3 mV
MDC IDC LEAD IMPLANT DT: 20160414
MDC IDC LEAD IMPLANT DT: 20160414
MDC IDC LEAD LOCATION: 753858
MDC IDC MSMT LEADCHNL LV IMPEDANCE VALUE: 513 Ohm
MDC IDC MSMT LEADCHNL LV IMPEDANCE VALUE: 513 Ohm
MDC IDC MSMT LEADCHNL LV IMPEDANCE VALUE: 760 Ohm
MDC IDC MSMT LEADCHNL LV IMPEDANCE VALUE: 779 Ohm
MDC IDC MSMT LEADCHNL RA IMPEDANCE VALUE: 532 Ohm
MDC IDC MSMT LEADCHNL RA PACING THRESHOLD PULSEWIDTH: 0.4 ms
MDC IDC MSMT LEADCHNL RV IMPEDANCE VALUE: 418 Ohm
MDC IDC MSMT LEADCHNL RV IMPEDANCE VALUE: 456 Ohm
MDC IDC MSMT LEADCHNL RV SENSING INTR AMPL: 14.5 mV
MDC IDC SESS DTM: 20181001041804
MDC IDC SET LEADCHNL LV PACING PULSEWIDTH: 0.8 ms
MDC IDC SET LEADCHNL RA PACING AMPLITUDE: 1.5 V
MDC IDC SET LEADCHNL RV PACING AMPLITUDE: 0.5 V
MDC IDC SET LEADCHNL RV PACING PULSEWIDTH: 0.03 ms
MDC IDC STAT BRADY AP VP PERCENT: 18.96 %
MDC IDC STAT BRADY RV PERCENT PACED: 81.93 %

## 2017-02-28 ENCOUNTER — Ambulatory Visit (INDEPENDENT_AMBULATORY_CARE_PROVIDER_SITE_OTHER): Payer: Medicare Other | Admitting: *Deleted

## 2017-02-28 DIAGNOSIS — I513 Intracardiac thrombosis, not elsewhere classified: Secondary | ICD-10-CM | POA: Diagnosis not present

## 2017-02-28 DIAGNOSIS — Z5181 Encounter for therapeutic drug level monitoring: Secondary | ICD-10-CM | POA: Diagnosis not present

## 2017-02-28 DIAGNOSIS — I240 Acute coronary thrombosis not resulting in myocardial infarction: Secondary | ICD-10-CM

## 2017-02-28 LAB — POCT INR: INR: 3.3

## 2017-03-07 ENCOUNTER — Encounter: Payer: Self-pay | Admitting: Cardiology

## 2017-03-14 ENCOUNTER — Ambulatory Visit (INDEPENDENT_AMBULATORY_CARE_PROVIDER_SITE_OTHER): Payer: Medicare Other

## 2017-03-14 DIAGNOSIS — I513 Intracardiac thrombosis, not elsewhere classified: Secondary | ICD-10-CM

## 2017-03-14 DIAGNOSIS — Z5181 Encounter for therapeutic drug level monitoring: Secondary | ICD-10-CM

## 2017-03-14 DIAGNOSIS — I236 Thrombosis of atrium, auricular appendage, and ventricle as current complications following acute myocardial infarction: Secondary | ICD-10-CM

## 2017-03-14 LAB — POCT INR: INR: 4.4

## 2017-03-28 ENCOUNTER — Ambulatory Visit (INDEPENDENT_AMBULATORY_CARE_PROVIDER_SITE_OTHER): Payer: Medicare Other | Admitting: Pharmacist Clinician (PhC)/ Clinical Pharmacy Specialist

## 2017-03-28 DIAGNOSIS — I513 Intracardiac thrombosis, not elsewhere classified: Secondary | ICD-10-CM | POA: Diagnosis not present

## 2017-03-28 DIAGNOSIS — Z5181 Encounter for therapeutic drug level monitoring: Secondary | ICD-10-CM | POA: Diagnosis not present

## 2017-03-28 LAB — POCT INR: INR: 2.1

## 2017-04-18 ENCOUNTER — Ambulatory Visit (INDEPENDENT_AMBULATORY_CARE_PROVIDER_SITE_OTHER): Payer: Medicare Other | Admitting: *Deleted

## 2017-04-18 DIAGNOSIS — Z5181 Encounter for therapeutic drug level monitoring: Secondary | ICD-10-CM | POA: Diagnosis not present

## 2017-04-18 DIAGNOSIS — I240 Acute coronary thrombosis not resulting in myocardial infarction: Secondary | ICD-10-CM

## 2017-04-18 DIAGNOSIS — I513 Intracardiac thrombosis, not elsewhere classified: Secondary | ICD-10-CM

## 2017-04-18 LAB — POCT INR: INR: 2.9

## 2017-04-18 NOTE — Patient Instructions (Signed)
Continue taking 1 tablet daily, except a 1/2 tablet on Sundays. Call coumadin clinic if placed on any new medications or scheduled for any procedures - [phone]. Recheck in 4 weeks.

## 2017-05-16 ENCOUNTER — Ambulatory Visit (INDEPENDENT_AMBULATORY_CARE_PROVIDER_SITE_OTHER): Payer: Medicare Other | Admitting: *Deleted

## 2017-05-16 DIAGNOSIS — I513 Intracardiac thrombosis, not elsewhere classified: Secondary | ICD-10-CM | POA: Diagnosis not present

## 2017-05-16 DIAGNOSIS — Z5181 Encounter for therapeutic drug level monitoring: Secondary | ICD-10-CM | POA: Diagnosis not present

## 2017-05-16 LAB — POCT INR: INR: 2.2

## 2017-05-16 NOTE — Patient Instructions (Signed)
Description   Continue taking 1 tablet daily, except a 1/2 tablet on Sundays. Call coumadin clinic if placed on any new medications or scheduled for any procedures - [phone]. Recheck in 5 weeks.

## 2017-05-19 ENCOUNTER — Ambulatory Visit (INDEPENDENT_AMBULATORY_CARE_PROVIDER_SITE_OTHER): Payer: Medicare Other | Admitting: *Deleted

## 2017-05-19 DIAGNOSIS — I5022 Chronic systolic (congestive) heart failure: Secondary | ICD-10-CM

## 2017-05-19 DIAGNOSIS — I255 Ischemic cardiomyopathy: Secondary | ICD-10-CM | POA: Diagnosis not present

## 2017-05-19 NOTE — Progress Notes (Signed)
Remote ICD transmission.   

## 2017-05-21 LAB — CUP PACEART REMOTE DEVICE CHECK
Brady Statistic AP VP Percent: 21.88 %
Brady Statistic AP VS Percent: 0.18 %
Brady Statistic AS VP Percent: 68.26 %
Brady Statistic AS VS Percent: 9.68 %
HIGH POWER IMPEDANCE MEASURED VALUE: 93 Ohm
Implantable Lead Implant Date: 20160414
Implantable Lead Location: 753858
Implantable Lead Model: 6935
Lead Channel Impedance Value: 361 Ohm
Lead Channel Impedance Value: 475 Ohm
Lead Channel Impedance Value: 475 Ohm
Lead Channel Impedance Value: 513 Ohm
Lead Channel Impedance Value: 513 Ohm
Lead Channel Impedance Value: 779 Ohm
Lead Channel Sensing Intrinsic Amplitude: 0.75 mV
Lead Channel Sensing Intrinsic Amplitude: 11.125 mV
Lead Channel Sensing Intrinsic Amplitude: 11.125 mV
Lead Channel Setting Pacing Amplitude: 0.5 V
Lead Channel Setting Pacing Amplitude: 2.5 V
Lead Channel Setting Pacing Pulse Width: 0.03 ms
Lead Channel Setting Pacing Pulse Width: 0.8 ms
MDC IDC LEAD IMPLANT DT: 20160414
MDC IDC LEAD IMPLANT DT: 20160414
MDC IDC LEAD LOCATION: 753859
MDC IDC LEAD LOCATION: 753860
MDC IDC MSMT BATTERY REMAINING LONGEVITY: 50 mo
MDC IDC MSMT BATTERY VOLTAGE: 2.97 V
MDC IDC MSMT LEADCHNL LV IMPEDANCE VALUE: 456 Ohm
MDC IDC MSMT LEADCHNL LV IMPEDANCE VALUE: 532 Ohm
MDC IDC MSMT LEADCHNL LV IMPEDANCE VALUE: 779 Ohm
MDC IDC MSMT LEADCHNL LV IMPEDANCE VALUE: 817 Ohm
MDC IDC MSMT LEADCHNL LV IMPEDANCE VALUE: 836 Ohm
MDC IDC MSMT LEADCHNL LV IMPEDANCE VALUE: 836 Ohm
MDC IDC MSMT LEADCHNL RA IMPEDANCE VALUE: 475 Ohm
MDC IDC MSMT LEADCHNL RA PACING THRESHOLD AMPLITUDE: 0.625 V
MDC IDC MSMT LEADCHNL RA PACING THRESHOLD PULSEWIDTH: 0.4 ms
MDC IDC MSMT LEADCHNL RA SENSING INTR AMPL: 0.75 mV
MDC IDC PG IMPLANT DT: 20160414
MDC IDC SESS DTM: 20181231093725
MDC IDC SET LEADCHNL RA PACING AMPLITUDE: 1.5 V
MDC IDC SET LEADCHNL RV SENSING SENSITIVITY: 0.3 mV
MDC IDC STAT BRADY RA PERCENT PACED: 20.12 %
MDC IDC STAT BRADY RV PERCENT PACED: 80.4 %

## 2017-05-23 ENCOUNTER — Encounter: Payer: Self-pay | Admitting: Cardiology

## 2017-05-23 NOTE — Progress Notes (Signed)
Letter  

## 2017-06-20 ENCOUNTER — Ambulatory Visit (INDEPENDENT_AMBULATORY_CARE_PROVIDER_SITE_OTHER): Payer: Medicare Other | Admitting: *Deleted

## 2017-06-20 DIAGNOSIS — Z5181 Encounter for therapeutic drug level monitoring: Secondary | ICD-10-CM

## 2017-06-20 DIAGNOSIS — I513 Intracardiac thrombosis, not elsewhere classified: Secondary | ICD-10-CM | POA: Diagnosis not present

## 2017-06-20 LAB — POCT INR: INR: 3.1

## 2017-06-20 NOTE — Patient Instructions (Signed)
Description   Today only take 1/2 tablet, then continue taking 1 tablet daily, except a 1/2 tablet on Sundays. Call coumadin clinic if placed on any new medications or scheduled for any procedures - [phone]. Recheck in 4 weeks.

## 2017-07-16 ENCOUNTER — Other Ambulatory Visit: Payer: Self-pay | Admitting: Cardiology

## 2017-07-18 ENCOUNTER — Ambulatory Visit (INDEPENDENT_AMBULATORY_CARE_PROVIDER_SITE_OTHER): Payer: Medicare Other | Admitting: *Deleted

## 2017-07-18 DIAGNOSIS — Z5181 Encounter for therapeutic drug level monitoring: Secondary | ICD-10-CM | POA: Diagnosis not present

## 2017-07-18 DIAGNOSIS — I513 Intracardiac thrombosis, not elsewhere classified: Secondary | ICD-10-CM

## 2017-07-18 LAB — POCT INR: INR: 2.3

## 2017-07-18 NOTE — Patient Instructions (Signed)
Description   Continue taking 1 tablet daily, except a 1/2 tablet on Sundays. Call coumadin clinic if placed on any new medications or scheduled for any procedures - [phone]. Recheck in 5 weeks.      

## 2017-08-18 ENCOUNTER — Ambulatory Visit (INDEPENDENT_AMBULATORY_CARE_PROVIDER_SITE_OTHER): Payer: Medicare Other | Admitting: *Deleted

## 2017-08-18 DIAGNOSIS — I255 Ischemic cardiomyopathy: Secondary | ICD-10-CM | POA: Diagnosis not present

## 2017-08-18 NOTE — Progress Notes (Signed)
Remote ICD transmission.   

## 2017-08-19 ENCOUNTER — Encounter: Payer: Self-pay | Admitting: Cardiology

## 2017-08-20 LAB — CUP PACEART REMOTE DEVICE CHECK
Battery Remaining Longevity: 45 mo
Battery Voltage: 2.96 V
Brady Statistic AP VP Percent: 28.53 %
Brady Statistic AS VP Percent: 62.6 %
Brady Statistic AS VS Percent: 8.7 %
Brady Statistic RV Percent Paced: 79.56 %
Date Time Interrogation Session: 20190401083625
HIGH POWER IMPEDANCE MEASURED VALUE: 87 Ohm
Implantable Lead Implant Date: 20160414
Implantable Lead Location: 753858
Implantable Lead Location: 753859
Implantable Lead Location: 753860
Implantable Lead Model: 4398
Implantable Lead Model: 5076
Lead Channel Impedance Value: 361 Ohm
Lead Channel Impedance Value: 456 Ohm
Lead Channel Impedance Value: 475 Ohm
Lead Channel Impedance Value: 513 Ohm
Lead Channel Impedance Value: 779 Ohm
Lead Channel Impedance Value: 779 Ohm
Lead Channel Impedance Value: 779 Ohm
Lead Channel Impedance Value: 817 Ohm
Lead Channel Pacing Threshold Pulse Width: 0.4 ms
Lead Channel Sensing Intrinsic Amplitude: 0.5 mV
Lead Channel Sensing Intrinsic Amplitude: 12 mV
Lead Channel Sensing Intrinsic Amplitude: 12 mV
Lead Channel Setting Pacing Amplitude: 0.5 V
Lead Channel Setting Pacing Amplitude: 1.5 V
Lead Channel Setting Pacing Amplitude: 2.5 V
Lead Channel Setting Pacing Pulse Width: 0.03 ms
Lead Channel Setting Pacing Pulse Width: 0.8 ms
MDC IDC LEAD IMPLANT DT: 20160414
MDC IDC LEAD IMPLANT DT: 20160414
MDC IDC MSMT LEADCHNL LV IMPEDANCE VALUE: 456 Ohm
MDC IDC MSMT LEADCHNL LV IMPEDANCE VALUE: 475 Ohm
MDC IDC MSMT LEADCHNL LV IMPEDANCE VALUE: 513 Ohm
MDC IDC MSMT LEADCHNL LV IMPEDANCE VALUE: 779 Ohm
MDC IDC MSMT LEADCHNL RA IMPEDANCE VALUE: 513 Ohm
MDC IDC MSMT LEADCHNL RA PACING THRESHOLD AMPLITUDE: 0.75 V
MDC IDC MSMT LEADCHNL RA SENSING INTR AMPL: 0.5 mV
MDC IDC PG IMPLANT DT: 20160414
MDC IDC SET LEADCHNL RV SENSING SENSITIVITY: 0.3 mV
MDC IDC STAT BRADY AP VS PERCENT: 0.17 %
MDC IDC STAT BRADY RA PERCENT PACED: 25.66 %

## 2017-08-22 ENCOUNTER — Ambulatory Visit (INDEPENDENT_AMBULATORY_CARE_PROVIDER_SITE_OTHER): Payer: Medicare Other | Admitting: *Deleted

## 2017-08-22 DIAGNOSIS — Z5181 Encounter for therapeutic drug level monitoring: Secondary | ICD-10-CM | POA: Diagnosis not present

## 2017-08-22 DIAGNOSIS — I513 Intracardiac thrombosis, not elsewhere classified: Secondary | ICD-10-CM | POA: Diagnosis not present

## 2017-08-22 LAB — POCT INR: INR: 3.1

## 2017-08-22 NOTE — Patient Instructions (Signed)
Description   Today only take 1/2 tablet, then Continue taking 1 tablet daily, except a 1/2 tablet on Sundays. Call coumadin clinic if placed on any new medications or scheduled for any procedures - [phone]. Recheck in 4  weeks.

## 2017-09-25 ENCOUNTER — Ambulatory Visit (INDEPENDENT_AMBULATORY_CARE_PROVIDER_SITE_OTHER): Payer: Medicare Other | Admitting: Pharmacist

## 2017-09-25 DIAGNOSIS — Z5181 Encounter for therapeutic drug level monitoring: Secondary | ICD-10-CM | POA: Diagnosis not present

## 2017-09-25 DIAGNOSIS — I513 Intracardiac thrombosis, not elsewhere classified: Secondary | ICD-10-CM

## 2017-09-25 LAB — POCT INR: INR: 3.3

## 2017-09-25 NOTE — Patient Instructions (Signed)
Description   Skip your Coumadin today, then continue taking 1 tablet daily, except 1/2 tablet on Sundays. Call coumadin clinic if placed on any new medications or scheduled for any procedures - [phone]. Recheck in 3 weeks.

## 2017-10-17 ENCOUNTER — Ambulatory Visit (INDEPENDENT_AMBULATORY_CARE_PROVIDER_SITE_OTHER): Payer: Medicare Other | Admitting: Pharmacist

## 2017-10-17 DIAGNOSIS — Z5181 Encounter for therapeutic drug level monitoring: Secondary | ICD-10-CM | POA: Diagnosis not present

## 2017-10-17 DIAGNOSIS — I513 Intracardiac thrombosis, not elsewhere classified: Secondary | ICD-10-CM

## 2017-10-17 LAB — POCT INR: INR: 2.9 (ref 2.0–3.0)

## 2017-10-17 NOTE — Patient Instructions (Signed)
Description   Continue taking 1 tablet daily, except 1/2 tablet on Sundays. Call coumadin clinic if placed on any new medications or scheduled for any procedures - [phone]. Recheck in 4 weeks.

## 2017-11-25 ENCOUNTER — Ambulatory Visit (INDEPENDENT_AMBULATORY_CARE_PROVIDER_SITE_OTHER): Payer: Medicare Other | Admitting: *Deleted

## 2017-11-25 DIAGNOSIS — I240 Acute coronary thrombosis not resulting in myocardial infarction: Secondary | ICD-10-CM

## 2017-11-25 DIAGNOSIS — I513 Intracardiac thrombosis, not elsewhere classified: Secondary | ICD-10-CM | POA: Diagnosis not present

## 2017-11-25 DIAGNOSIS — Z5181 Encounter for therapeutic drug level monitoring: Secondary | ICD-10-CM | POA: Diagnosis not present

## 2017-11-25 LAB — PROTIME-INR
INR: 8.9 (ref 0.8–1.2)
Prothrombin Time: 83.7 s — ABNORMAL HIGH (ref 9.1–12.0)

## 2017-11-25 LAB — POCT INR: INR: 8 — AB (ref 2.0–3.0)

## 2017-11-25 NOTE — Patient Instructions (Signed)
Description   Has not taken coumadin today Stat Venipuncture Monitor for any bleeding and go to ER if seen Do good serving of greens when gets home Spoke with pt and instructed to hold coumadin today July 9th no coumadin on July 10th no coumadin on July 11th then made appt to have INR checked on Friday July 12th  Do good serving of greens each day and monitor for any bleeding and go to ER if seen   Call coumadin clinic if placed on any new medications or scheduled for any procedures - [phone].

## 2017-11-26 ENCOUNTER — Ambulatory Visit (INDEPENDENT_AMBULATORY_CARE_PROVIDER_SITE_OTHER): Payer: Medicare Other | Admitting: *Deleted

## 2017-11-26 DIAGNOSIS — I255 Ischemic cardiomyopathy: Secondary | ICD-10-CM | POA: Diagnosis not present

## 2017-11-26 DIAGNOSIS — I5022 Chronic systolic (congestive) heart failure: Secondary | ICD-10-CM

## 2017-11-27 ENCOUNTER — Emergency Department (HOSPITAL_COMMUNITY)
Admission: EM | Admit: 2017-11-27 | Discharge: 2017-11-27 | Disposition: A | Discharge status: Home / Self Care | Payer: Medicare Other | Attending: Emergency Medicine | Admitting: Emergency Medicine

## 2017-11-27 ENCOUNTER — Emergency Department (HOSPITAL_COMMUNITY): Payer: Medicare Other

## 2017-11-27 ENCOUNTER — Encounter (HOSPITAL_COMMUNITY): Payer: Self-pay | Admitting: *Deleted

## 2017-11-27 ENCOUNTER — Other Ambulatory Visit: Payer: Self-pay

## 2017-11-27 DIAGNOSIS — I110 Hypertensive heart disease with heart failure: Secondary | ICD-10-CM | POA: Insufficient documentation

## 2017-11-27 DIAGNOSIS — I2510 Atherosclerotic heart disease of native coronary artery without angina pectoris: Secondary | ICD-10-CM | POA: Insufficient documentation

## 2017-11-27 DIAGNOSIS — R0602 Shortness of breath: Secondary | ICD-10-CM | POA: Diagnosis not present

## 2017-11-27 DIAGNOSIS — F17210 Nicotine dependence, cigarettes, uncomplicated: Secondary | ICD-10-CM | POA: Diagnosis not present

## 2017-11-27 DIAGNOSIS — Z79899 Other long term (current) drug therapy: Secondary | ICD-10-CM | POA: Insufficient documentation

## 2017-11-27 DIAGNOSIS — I5042 Chronic combined systolic (congestive) and diastolic (congestive) heart failure: Secondary | ICD-10-CM | POA: Insufficient documentation

## 2017-11-27 LAB — CBC
HCT: 46.5 % (ref 39.0–52.0)
HEMOGLOBIN: 15 g/dL (ref 13.0–17.0)
MCH: 28.8 pg (ref 26.0–34.0)
MCHC: 32.3 g/dL (ref 30.0–36.0)
MCV: 89.3 fL (ref 78.0–100.0)
Platelets: 172 10*3/uL (ref 150–400)
RBC: 5.21 MIL/uL (ref 4.22–5.81)
RDW: 14.6 % (ref 11.5–15.5)
WBC: 9.1 10*3/uL (ref 4.0–10.5)

## 2017-11-27 LAB — I-STAT TROPONIN, ED
Troponin i, poc: 0.02 ng/mL (ref 0.00–0.08)
Troponin i, poc: 0.01 ng/mL (ref 0.00–0.08)

## 2017-11-27 LAB — PROTIME-INR
INR: 2.69
Prothrombin Time: 28.4 seconds — ABNORMAL HIGH (ref 11.4–15.2)

## 2017-11-27 LAB — BASIC METABOLIC PANEL
ANION GAP: 11 (ref 5–15)
BUN: 17 mg/dL (ref 8–23)
CHLORIDE: 104 mmol/L (ref 98–111)
CO2: 24 mmol/L (ref 22–32)
CREATININE: 1.23 mg/dL (ref 0.61–1.24)
Calcium: 9.7 mg/dL (ref 8.9–10.3)
GFR calc Af Amer: >60 mL/min (ref >60)
GFR calc non Af Amer: >60 mL/min (ref >60)
GLUCOSE: 104 mg/dL — AB (ref 70–99)
Potassium: 4.2 mmol/L (ref 3.5–5.1)
Sodium: 139 mmol/L (ref 135–145)

## 2017-11-27 LAB — HEPATIC FUNCTION PANEL
ALBUMIN: 3.7 g/dL (ref 3.5–5.0)
ALT: 16 U/L (ref 0–44)
AST: 20 U/L (ref 15–41)
Alkaline Phosphatase: 72 U/L (ref 38–126)
BILIRUBIN DIRECT: 0.3 mg/dL — AB (ref 0.0–0.2)
Indirect Bilirubin: 1.3 mg/dL — ABNORMAL HIGH (ref 0.3–0.9)
Total Bilirubin: 1.6 mg/dL — ABNORMAL HIGH (ref 0.3–1.2)
Total Protein: 7.2 g/dL (ref 6.5–8.1)

## 2017-11-27 LAB — BRAIN NATRIURETIC PEPTIDE: B Natriuretic Peptide: 540.8 pg/mL — ABNORMAL HIGH (ref 0.0–100.0)

## 2017-11-27 MED ORDER — IPRATROPIUM-ALBUTEROL 0.5-2.5 (3) MG/3ML IN SOLN
3.0000 mL | Freq: Once | RESPIRATORY_TRACT | Status: AC
  Administered 2017-11-27: 3 mL via RESPIRATORY_TRACT
  Filled 2017-11-27: qty 3

## 2017-11-27 MED ORDER — FUROSEMIDE 10 MG/ML IJ SOLN
40.0000 mg | Freq: Once | INTRAMUSCULAR | Status: AC
  Administered 2017-11-27: 40 mg via INTRAVENOUS
  Filled 2017-11-27: qty 4

## 2017-11-27 NOTE — Progress Notes (Signed)
Remote ICD transmission.   

## 2017-11-27 NOTE — Discharge Instructions (Signed)
Double your Lasix dose (80 mg) for 3 days.  Please go to your Coumadin check tomorrow and you will see the cardiology team as well.  Please return the emergency department if you develop any new or worsening symptoms.

## 2017-11-27 NOTE — ED Triage Notes (Addendum)
Pt in c/o SOB for several months, pt denies CP, pt has hx of CABG, pt reports having a pacemaker unable to state brand, pt takes Lasix daily, reports compliance, c/o bil leg swelling, pt denies n/v/d, pt reports being taken off Coumadin this past Monday d/t high INR, A&O x4

## 2017-11-27 NOTE — ED Notes (Signed)
Kelly from [employer] called with report == will send report to ED.

## 2017-11-27 NOTE — ED Notes (Signed)
[employer] Pacemaker interrogated

## 2017-11-27 NOTE — ED Provider Notes (Addendum)
[facility] EMERGENCY DEPARTMENT Provider Note   CSN: [id number] Arrival date & time: 11/27/17  0715     History   Chief Complaint Chief Complaint  Patient presents with  . Shortness of Breath    HPI Joseph Carson is a 62 y.o. male with history of CHF, chronic atrial fibrillation, CAD with CABG, AICD present, COPD who presents with a 2 to 16-month history of intermittent shortness of breath, worse on exertion.  Patient reports his symptoms began after having an upper respiratory infection with cough.  It improved with Mucinex, however he persists with shortness of breath.  He reports he does not feel like he can get a full breath in.  He denies any chest pain, fever.  He reports he has had some leg swelling on his left, however this is now improving.  He has been taking 40 mg of Lasix once daily as prescribed.  He has a follow-up with his cardiologist on 12/16/2017 to review his pacemaker data and for a general checkup.  He has not seen his cardiologist in around 3 months he reports.  He denies any abdominal pain, nausea, vomiting, urinary symptoms, wheezing, current cough.  He denies any recent long trips, surgeries, known cancer, history of blood clots.  He is anticoagulated on Coumadin, however he had a high INR 2 days ago and was told to stop for right now.  HPI  Past Medical History:  Diagnosis Date  . AICD (automatic cardioverter/defibrillator) present   . Atrial fibrillation, chronic (HCC)   . CAD (coronary artery disease)    a. s/p CABG x 3 03/2013 (L-LAD, S-RI, S-PDA) in [place]  . CAP (community acquired pneumonia) 08/22/2015  . CHF (congestive heart failure) (HCC)   . Chronic combined systolic and diastolic heart failure [facility]) November 2014  . Coronary artery disease   . HLD (hyperlipidemia)    . Hyperbilirubinemia   . Ischemic cardiomyopathy    a. echo (10/15):  EF 20%, diff HK, apical AK, mobile density inf wall near papillary muscle (?ruptured) with  mod MR, mod reduced RVSF, mild PI, PASP 37 mmHg b. s/p CRTD   . LBBB (left bundle branch block)    . LV (left ventricular) mural thrombus     a. TEE (10/15):  EF 15-20%, septum and ant walls AK, lat wall HK, no papillary muscle rupture, probable small thrombus on ant/ant-septal wall, mild MR, mod decreased RVSF, neg bubble study  . Mitral regurgitation     mild by TEE 02/2014  . NSVT (nonsustained ventricular tachycardia) (HCC)        . Obesity   . Pacemaker   . Pulmonary hypertension (HCC)   . Umbilical hernia     Patient Active Problem List   Diagnosis Date Noted  . Colon cancer screening 10/04/2015  . CAP (community acquired pneumonia) 08/22/2015  . Warfarin-induced coagulopathy (HCC) 08/22/2015  . Biventricular automatic implantable cardioverter defibrillator in situ 09/01/2014  . Encounter for therapeutic drug monitoring 08/25/2014  . Chronic systolic CHF (congestive heart failure) (HCC) 03/10/2014  . Mitral regurgitation 03/06/2014  . LV (left ventricular) mural thrombus 03/06/2014  . NSVT (nonsustained ventricular tachycardia) (HCC) 03/06/2014  . LBBB (left bundle branch block) 03/06/2014  . HLD (hyperlipidemia) 03/06/2014  . Coronary artery disease 03/01/2014  . Tobacco abuse 03/01/2014  . Pulmonary hypertension (HCC) 03/01/2014    Past Surgical History:  Procedure Laterality Date  . BI-VENTRICULAR IMPLANTABLE CARDIOVERTER DEFIBRILLATOR N/A 09/01/2014   MDT CRTD implanted by Dr Ladona Ridgel  .  CARDIAC CATHETERIZATION  03/2013  . CARDIAC CATHETERIZATION  2014  . CARDIAC DEFIBRILLATOR PLACEMENT  2016  . CORONARY ARTERY BYPASS GRAFT  03/2013   LIMA to the LAD, SVG to the ramus, SVG to the PDA  . CORONARY ARTERY BYPASS GRAFT  2014   CABG "X3"  . FRACTURE SURGERY    . INSERT / REPLACE / REMOVE PACEMAKER  2016  . PATELLA FRACTURE SURGERY Left 1972  . TEE WITHOUT CARDIOVERSION N/A 03/04/2014   Procedure: TRANSESOPHAGEAL ECHOCARDIOGRAM (TEE);  Surgeon: Laurey Morale, MD;   Location: [facility] ENDOSCOPY;  Service: Cardiovascular;  Laterality: N/A;        Home Medications    Prior to Admission medications   Medication Sig Start Date End Date Taking? Authorizing Provider  acetaminophen (TYLENOL) 325 MG tablet Take 1-2 tablets (325-650 mg total) by mouth every 4 (four) hours as needed for mild pain. 09/02/14  Yes Leone Brand, NP  atorvastatin (LIPITOR) 40 MG tablet Take 1 tablet (40 mg total) by mouth daily. 12/25/16  Yes Marinus Maw, MD  carvedilol (COREG) 6.25 MG tablet Take 2 tablets (12.5 mg total) by mouth 2 (two) times daily with a meal. 12/25/16  Yes Marinus Maw, MD  furosemide (LASIX) 40 MG tablet Take 1 tablet (40 mg total) by mouth daily. 12/05/16  Yes Marinus Maw, MD  Multiple Vitamins-Minerals (HAIR/SKIN/NAILS PO) Take 1 tablet by mouth daily.   Yes [provider]  spironolactone (ALDACTONE) 25 MG tablet Take 1 tablet (25 mg total) by mouth daily. 12/25/16  Yes Marinus Maw, MD  warfarin (COUMADIN) 5 MG tablet TAKE 1 TABLET DAILY, EXCEPT ON SUNDAYS TAKE 1 AND 1/2 TABLETS. (OR AS DIRECTED BY COUMADIN CLINIC) 07/17/17   Laurey Morale, MD    Family History Family History  Problem Relation Age of Onset  . Diabetes Mother        Deceased  . Thyroid disease Mother     Social History Social History   Tobacco Use  . Smoking status: Current Every Day Smoker    Packs/day: 0.30    Years: 45.00    Pack years: 13.50    Types: Cigarettes  . Smokeless tobacco: Never Used  . Tobacco comment: DOWN TO 1 CIGARETTE A DAY  Substance Use Topics  . Alcohol use: No    Alcohol/week: 0.0 oz  . Drug use: No    Types: Marijuana    Comment: 03/01/2014 "used last ~ 2 months ago"     Allergies   Patient has no known allergies.   Review of Systems Review of Systems  Constitutional: Negative for chills and fever.  HENT: Negative for facial swelling and sore throat.   Respiratory: Positive for shortness of breath. Negative for cough.     Cardiovascular: Positive for leg swelling. Negative for chest pain.  Gastrointestinal: Negative for abdominal pain, nausea and vomiting.  Genitourinary: Negative for dysuria.  Musculoskeletal: Negative for back pain.  Skin: Negative for rash and wound.  Neurological: Negative for headaches.  Psychiatric/Behavioral: The patient is not nervous/anxious.      Physical Exam Updated Vital Signs BP 100/67   Pulse 80   Temp 97.6 F (36.4 C) (Oral)   Resp (!) 25   SpO2 99%   Physical Exam  Constitutional: He appears well-developed and well-nourished. No distress.  Patient sitting comfortably on stretcher  HENT:  Head: Normocephalic and atraumatic.  Mouth/Throat: Oropharynx is clear and moist. No oropharyngeal exudate.  Eyes: Pupils are equal, round,  and reactive to light. Conjunctivae are normal. Right eye exhibits no discharge. Left eye exhibits no discharge. No scleral icterus.  Neck: Normal range of motion. Neck supple. No thyromegaly present.  Cardiovascular: Normal rate, regular rhythm, normal heart sounds and intact distal pulses. Exam reveals no gallop and no friction rub.  No murmur heard. Pulmonary/Chest: Effort normal. No stridor. No respiratory distress. He has wheezes (expiratory in the bases bilaterally). He has no rales.  Abdominal: Soft. Bowel sounds are normal. He exhibits no distension. There is no tenderness. There is no rebound and no guarding.  Musculoskeletal:       Right lower leg: He exhibits edema (1+).       Left lower leg: He exhibits edema (1+).  Lymphadenopathy:    He has no cervical adenopathy.  Neurological: He is alert. Coordination normal.  Skin: Skin is warm and dry. No rash noted. He is not diaphoretic. No pallor.  Psychiatric: He has a normal mood and affect.  Nursing note and vitals reviewed.    ED Treatments / Results  Labs (all labs ordered are listed, but only abnormal results are displayed) Labs Reviewed  BASIC METABOLIC PANEL -  Abnormal; Notable for the following components:      Result Value   Glucose, Bld 104 (*)    All other components within normal limits  PROTIME-INR - Abnormal; Notable for the following components:   Prothrombin Time 28.4 (*)    All other components within normal limits  BRAIN NATRIURETIC PEPTIDE - Abnormal; Notable for the following components:   B Natriuretic Peptide 540.8 (*)    All other components within normal limits  HEPATIC FUNCTION PANEL - Abnormal; Notable for the following components:   Total Bilirubin 1.6 (*)    Bilirubin, Direct 0.3 (*)    Indirect Bilirubin 1.3 (*)    All other components within normal limits  CBC  I-STAT TROPONIN, ED  I-STAT TROPONIN, ED    EKG EKG Interpretation  Date/Time:  Thursday November 27 2017 07:19:51 EDT Ventricular Rate:  83 PR Interval:    QRS Duration: 182 QT Interval:  478 QTC Calculation: 561 R Axis:   -85 Text Interpretation:  Electronic ventricular pacemaker agree.  Confirmed by Arby BarrettePfeiffer, Marcy [phone]([id number]) on 11/27/2017 11:27:00 AM   Radiology Dg Chest 2 View  Result Date: 11/27/2017 CLINICAL DATA:  Short of breath EXAM: CHEST - 2 VIEW COMPARISON:  08/22/2015 FINDINGS: Cardiac enlargement. AICD unchanged. Negative for heart failure. No edema or effusion COPD with pulmonary hyperinflation. Lingular patchy airspace disease has resolved in the interval. IMPRESSION: COPD without infiltrate. Cardiac enlargement without heart failure Electronically Signed   By: Marlan Palauharles  Clark M.D.   On: 11/27/2017 08:09    Procedures Procedures (including critical care time)  Medications Ordered in ED Medications  ipratropium-albuterol (DUONEB) 0.5-2.5 (3) MG/3ML nebulizer solution 3 mL (3 mLs Nebulization Given 11/27/17 1007)  furosemide (LASIX) injection 40 mg (40 mg Intravenous Given 11/27/17 1335)     Initial Impression / Assessment and Plan / ED Course  I have reviewed the triage vital signs and the nursing notes.  Pertinent labs & imaging  results that were available during my care of the patient were reviewed by me and considered in my medical decision making (see chart for details).  Clinical Course as of Nov 27 1644  Thu Nov 27, 2017  1326 Troponin i, poc: 0.01 [RM]  1327 Comment 3:        [RM]  1327 Comment 3:        [  RM]    Clinical Course User Index [RM] Mikael Spray    Patient presenting with a 49-month history of shortness of breath, worsening over the past week with exertion.  CBC, BMP, delta troponin unremarkable.  BNP mildly elevated at 540.  INR has returned to 2.69.  Pacemaker interrogation negative for events.  Chest x-ray shows cardiomegaly, but no acute fluid overload. COPD on chest x-ray as well.  Wheezing improved with DuoNeb.  Patient's symptoms improved with Lasix dose in the ED.  I spoke with cardiologist, Dr. Elease Hashimoto, who advised doubling patient's Lasix dose for 3 days and follow-up in the clinic.  Patient ambulated with pulse ox at 96% without shortness of breath.  I spoke with Rosann Auerbach with cardiology who arranged for patient to see cardiology tomorrow at his Coumadin check as a full appointment.  Strict return precautions given.  Patient and family understand and agree with plan.  Patient vitals stable throughout ED course and discharged in satisfactory condition.  Final Clinical Impressions(s) / ED Diagnoses   Final diagnoses:  Shortness of breath    ED Discharge Orders    None        Emi Holes, PA-C 11/27/17 1647    Arby Barrette, MD 11/30/17 [phone]

## 2017-11-27 NOTE — ED Provider Notes (Signed)
Medical screening examination/treatment/procedure(s) were conducted as a shared visit with non-physician practitioner(s) and myself.  I personally evaluated the patient during the encounter.  EKG Interpretation  Date/Time:  Thursday November 27 2017 07:19:51 EDT Ventricular Rate:  83 PR Interval:    QRS Duration: 182 QT Interval:  478 QTC Calculation: 561 R Axis:   -85 Text Interpretation:  Electronic ventricular pacemaker agree.  Confirmed by Arby BarrettePfeiffer, Remon Quinto [phone]([id number]) on 11/27/2017 11:27:00 AM Patient reports he has had shortness of breath has been gradually increasing for several months.  In first he thought he got a cold.  Once that resolved however, shortness of breath with exertion persisted.  Over the weeks it has gradually worsened.  Now he reports if he takes a walk he has to stop and rest to catch his breath.  Denies associated chest pain.  He reports he does get some swelling in his legs bilaterally that improves with elevating.  Patient is alert and appropriate.  No respiratory distress at rest.  Heart regular.  No gross rub murmur gallop.  Positive for basilar rales bilaterally.  Abdomen soft and nontender.  Minimal trace edema pretibial.  Calves soft nontender  Agree with plan of management.   Arby BarrettePfeiffer, Shreyas Piatkowski, MD 11/27/17 1253

## 2017-11-27 NOTE — ED Notes (Signed)
Pt ambulated throughout hallway without assistance. O2 sats maintained at 96% throughout, no shortness of breath noted, gait steady.

## 2017-11-28 ENCOUNTER — Ambulatory Visit (INDEPENDENT_AMBULATORY_CARE_PROVIDER_SITE_OTHER): Payer: Medicare Other | Admitting: *Deleted

## 2017-11-28 ENCOUNTER — Encounter: Payer: Self-pay | Admitting: Internal Medicine

## 2017-11-28 ENCOUNTER — Ambulatory Visit (INDEPENDENT_AMBULATORY_CARE_PROVIDER_SITE_OTHER): Payer: Medicare Other | Admitting: Internal Medicine

## 2017-11-28 ENCOUNTER — Telehealth: Payer: Self-pay | Admitting: Pharmacist

## 2017-11-28 ENCOUNTER — Other Ambulatory Visit (HOSPITAL_COMMUNITY): Payer: Self-pay | Admitting: *Deleted

## 2017-11-28 VITALS — BP 108/80 | HR 86 | Ht 70.0 in | Wt 205.4 lb

## 2017-11-28 DIAGNOSIS — I2510 Atherosclerotic heart disease of native coronary artery without angina pectoris: Secondary | ICD-10-CM | POA: Diagnosis not present

## 2017-11-28 DIAGNOSIS — I4891 Unspecified atrial fibrillation: Secondary | ICD-10-CM | POA: Diagnosis not present

## 2017-11-28 DIAGNOSIS — I255 Ischemic cardiomyopathy: Secondary | ICD-10-CM | POA: Diagnosis not present

## 2017-11-28 DIAGNOSIS — Z5181 Encounter for therapeutic drug level monitoring: Secondary | ICD-10-CM

## 2017-11-28 DIAGNOSIS — I5022 Chronic systolic (congestive) heart failure: Secondary | ICD-10-CM | POA: Diagnosis not present

## 2017-11-28 DIAGNOSIS — Z95810 Presence of automatic (implantable) cardiac defibrillator: Secondary | ICD-10-CM

## 2017-11-28 LAB — CUP PACEART INCLINIC DEVICE CHECK
Battery Remaining Longevity: 21 mo
Battery Voltage: 2.92 V
Brady Statistic AP VP Percent: 2.28 %
Brady Statistic AS VS Percent: 24.7 %
Brady Statistic RV Percent Paced: 67.05 %
Date Time Interrogation Session: 20190712145216
HighPow Impedance: 69 Ohm
Implantable Lead Implant Date: 20160414
Implantable Lead Location: 753859
Implantable Lead Model: 4398
Lead Channel Impedance Value: 342 Ohm
Lead Channel Impedance Value: 342 Ohm
Lead Channel Impedance Value: 399 Ohm
Lead Channel Impedance Value: 456 Ohm
Lead Channel Impedance Value: 475 Ohm
Lead Channel Impedance Value: 703 Ohm
Lead Channel Impedance Value: 722 Ohm
Lead Channel Pacing Threshold Amplitude: 1.75 V
Lead Channel Pacing Threshold Pulse Width: 0.4 ms
Lead Channel Pacing Threshold Pulse Width: 0.8 ms
Lead Channel Sensing Intrinsic Amplitude: 12.125 mV
Lead Channel Setting Pacing Amplitude: 0.5 V
Lead Channel Setting Pacing Amplitude: 1.5 V
Lead Channel Setting Sensing Sensitivity: 0.3 mV
MDC IDC LEAD IMPLANT DT: 20160414
MDC IDC LEAD IMPLANT DT: 20160414
MDC IDC LEAD LOCATION: 753858
MDC IDC LEAD LOCATION: 753860
MDC IDC MSMT LEADCHNL LV IMPEDANCE VALUE: 418 Ohm
MDC IDC MSMT LEADCHNL LV IMPEDANCE VALUE: 646 Ohm
MDC IDC MSMT LEADCHNL LV IMPEDANCE VALUE: 665 Ohm
MDC IDC MSMT LEADCHNL LV IMPEDANCE VALUE: 665 Ohm
MDC IDC MSMT LEADCHNL RA IMPEDANCE VALUE: 456 Ohm
MDC IDC MSMT LEADCHNL RA PACING THRESHOLD AMPLITUDE: 0.75 V
MDC IDC MSMT LEADCHNL RA SENSING INTR AMPL: 0.625 mV
MDC IDC MSMT LEADCHNL RV IMPEDANCE VALUE: 399 Ohm
MDC IDC MSMT LEADCHNL RV SENSING INTR AMPL: 12.125 mV
MDC IDC PG IMPLANT DT: 20160414
MDC IDC SET LEADCHNL LV PACING AMPLITUDE: 2.5 V
MDC IDC SET LEADCHNL LV PACING PULSEWIDTH: 0.8 ms
MDC IDC SET LEADCHNL RV PACING PULSEWIDTH: 0.03 ms
MDC IDC STAT BRADY AP VS PERCENT: 0.11 %
MDC IDC STAT BRADY AS VP PERCENT: 72.9 %
MDC IDC STAT BRADY RA PERCENT PACED: 1.62 %

## 2017-11-28 LAB — POCT INR: INR: 1.7 — AB (ref 2.0–3.0)

## 2017-11-28 NOTE — H&P (View-Only) (Signed)
    HPI [name] returns today for followup. He has longstanding chronic systolic heart failure, and LBBB and underwent Biv ICD implant 3 years ago. In the interim he has developed atrial fib. He does not have palpitations. His energy level is reduced. No chest pain. He has dyspnea with exertion. He has been in atrial fib for 3 months. No Known Allergies   Current Outpatient Medications  Medication Sig Dispense Refill  . acetaminophen (TYLENOL) 325 MG tablet Take 1-2 tablets (325-650 mg total) by mouth every 4 (four) hours as needed for mild pain.    . atorvastatin (LIPITOR) 40 MG tablet Take 1 tablet (40 mg total) by mouth daily. 90 tablet 3  . carvedilol (COREG) 6.25 MG tablet Take 2 tablets (12.5 mg total) by mouth 2 (two) times daily with a meal. 360 tablet 3  . furosemide (LASIX) 40 MG tablet Take 1 tablet (40 mg total) by mouth daily. 30 tablet 11  . Multiple Vitamins-Minerals (HAIR/SKIN/NAILS PO) Take 1 tablet by mouth daily.    . spironolactone (ALDACTONE) 25 MG tablet Take 1 tablet (25 mg total) by mouth daily. 90 tablet 3  . warfarin (COUMADIN) 5 MG tablet TAKE 1 TABLET DAILY, EXCEPT ON SUNDAYS TAKE 1 AND 1/2 TABLETS. (OR AS DIRECTED BY COUMADIN CLINIC) 40 tablet 3   No current facility-administered medications for this visit.      Past Medical History:  Diagnosis Date  . AICD (automatic cardioverter/defibrillator) present   . Atrial fibrillation, chronic (HCC)   . CAD (coronary artery disease)    a. s/p CABG x 3 03/2013 (L-LAD, S-RI, S-PDA) in [place]  . CAP (community acquired pneumonia) 08/22/2015  . CHF (congestive heart failure) (HCC)   . Chronic combined systolic and diastolic heart failure (HCC) November 2014  . Coronary artery disease   . HLD (hyperlipidemia)    . Hyperbilirubinemia   . Ischemic cardiomyopathy    a. echo (10/15):  EF 20%, diff HK, apical AK, mobile density inf wall near papillary muscle (?ruptured) with mod MR, mod reduced RVSF, mild PI, PASP 37  mmHg b. s/p CRTD   . LBBB (left bundle branch block)    . LV (left ventricular) mural thrombus     a. TEE (10/15):  EF 15-20%, septum and ant walls AK, lat wall HK, no papillary muscle rupture, probable small thrombus on ant/ant-septal wall, mild MR, mod decreased RVSF, neg bubble study  . Mitral regurgitation     mild by TEE 02/2014  . NSVT (nonsustained ventricular tachycardia) (HCC)        . Obesity   . Pacemaker   . Pulmonary hypertension (HCC)   . Umbilical hernia     ROS:   All systems reviewed and negative except as noted in the HPI.   Past Surgical History:  Procedure Laterality Date  . BI-VENTRICULAR IMPLANTABLE CARDIOVERTER DEFIBRILLATOR N/A 09/01/2014   MDT CRTD implanted by Dr Joseph Carson  . CARDIAC CATHETERIZATION  03/2013  . CARDIAC CATHETERIZATION  2014  . CARDIAC DEFIBRILLATOR PLACEMENT  2016  . CORONARY ARTERY BYPASS GRAFT  03/2013   LIMA to the LAD, SVG to the ramus, SVG to the PDA  . CORONARY ARTERY BYPASS GRAFT  2014   CABG "X3"  . FRACTURE SURGERY    . INSERT / REPLACE / REMOVE PACEMAKER  2016  . PATELLA FRACTURE SURGERY Left 1972  . TEE WITHOUT CARDIOVERSION N/A 03/04/2014   Procedure: TRANSESOPHAGEAL ECHOCARDIOGRAM (TEE);  Surgeon: Joseph S McLean, MD;  Location: [facility]   ENDOSCOPY;  Service: Cardiovascular;  Laterality: N/A;     Family History  Problem Relation Age of Onset  . Diabetes Mother        Deceased  . Thyroid disease Mother      Social History   Socioeconomic History  . Marital status: Divorced    Spouse name: Not on file  . Number of children: Not on file  . Years of education: Not on file  . Highest education level: Not on file  Occupational History  . Not on file  Social Needs  . Financial resource strain: Not on file  . Food insecurity:    Worry: Not on file    Inability: Not on file  . Transportation needs:    Medical: Not on file    Non-medical: Not on file  Tobacco Use  . Smoking status: Current Every Day Smoker     Packs/day: 0.30    Years: 45.00    Pack years: 13.50    Types: Cigarettes  . Smokeless tobacco: Never Used  . Tobacco comment: DOWN TO 1 CIGARETTE A DAY  Substance and Sexual Activity  . Alcohol use: No    Alcohol/week: 0.0 oz  . Drug use: No    Types: Marijuana    Comment: 03/01/2014 "used last ~ 2 months ago"  . Sexual activity: Not Currently  Lifestyle  . Physical activity:    Days per week: Not on file    Minutes per session: Not on file  . Stress: Not on file  Relationships  . Social connections:    Talks on phone: Not on file    Gets together: Not on file    Attends religious service: Not on file    Active member of club or organization: Not on file    Attends meetings of clubs or organizations: Not on file    Relationship status: Not on file  . Intimate partner violence:    Fear of current or ex partner: Not on file    Emotionally abused: Not on file    Physically abused: Not on file    Forced sexual activity: Not on file  Other Topics Concern  . Not on file  Social History Narrative   ** Merged History Encounter **         BP 108/80   Pulse 86   Ht 5' 10" (1.778 m)   Wt 205 lb 6.4 oz (93.2 kg)   BMI 29.47 kg/m   Physical Exam:  Well appearing middle aged man, NAD HEENT: Unremarkable Neck:  No JVD, no thyromegally Lymphatics:  No adenopathy Back:  No CVA tenderness Lungs:  Clear with no wheezes HEART:  Regular rate rhythm, no murmurs, no rubs, no clicks Abd:  soft, positive bowel sounds, no organomegally, no rebound, no guarding Ext:  2 plus pulses, no edema, no cyanosis, no clubbing Skin:  No rashes no nodules Neuro:  CN II through XII intact, motor grossly intact  EKG - atrial fib with ventricular pacing  DEVICE  Normal device function.  See PaceArt for details.   Assess/Plan: 1. Atrial fib - this is a new problem. He will undergo TEE guided DCCV as his INR was subtherapeutic. He will be started on defetilide. 2. ICD - his device is working  normally. Will recheck in several months.  3. Chronic systolic heart failure - he is worse in atrial fib. He will continue his current meds and cardiovert. 4. Coags - his INR was 8, and coumadin held and   now it is under 2. He will recquire TEE before starting dofetilide.  Valeska Haislip, M.D. 

## 2017-11-28 NOTE — Telephone Encounter (Signed)
Medication list reviewed in anticipation of upcoming Tikosyn initiation. Patient is not taking any contraindicated or QTc prolonging medications.   Patient is anticoagulated on warfarin. INR was subtherapeutic today. Pt will undergo TEE before Tikosyn initiation next Tuesday 12/02/17. Pt will have INR checked earlier that day to ensure he is therapeutic on day of admission.  Patient will need to be counseled to avoid use of Benadryl while on Tikosyn and in the 2-3 days prior to Tikosyn initiation.

## 2017-11-28 NOTE — Patient Instructions (Addendum)
Medication Instructions:  Your physician recommends that you continue on your current medications as directed. Please refer to the Current Medication list given to you today.  Labwork: You will get lab work drawn at the [facility] clinic before your TEE.  Testing/Procedures: Your physician has requested that you have a TEE. During a TEE, sound waves are used to create images of your heart. It provides your doctor with information about the size and shape of your heart and how well your heart's chambers and valves are working. In this test, a transducer is attached to the end of a flexible tube that's guided down your throat and into your esophagus (the tube leading from you mouth to your stomach) to get a more detailed image of your heart. You are not awake for the procedure. Please see the instruction sheet given to you today. For further information please visit [email or web address]  Scheduled for December 02, 2017 at 3:00 pm  Follow-Up:  Kennyth Arnold RN for Joseph Coco NP at the afib clinic will call you to be seen prior to your TEE.  Any Other Special Instructions Will Be Listed Below (If Applicable).   You are scheduled for a TEE on December 02, 2017 with Dr. Cristal Deer.   DIET: Nothing to eat or drink after midnight except a sip of water with medications (see medication instructions below)  Medication Instructions: Take your morning medications.  Do NOT miss any doses of your warfarin.   If you need a refill on your cardiac medications before your next appointment, please call your pharmacy.   Dofetilide capsules What is this medicine? DOFETILIDE (doe FET il ide) is an antiarrhythmic drug. It helps make your heart beat regularly. This medicine also helps to slow rapid heartbeats. This medicine may be used for other purposes; ask your health care provider or pharmacist if you have questions. COMMON BRAND NAME(S): Tikosyn What should I tell my health care provider before I take this  medicine? They need to know if you have any of these conditions: -heart disease -history of low levels of potassium or magnesium -kidney disease -liver disease -an unusual or allergic reaction to dofetilide, other medicines, foods, dyes, or preservatives -pregnant or trying to get pregnant -breast-feeding How should I use this medicine? Take this medicine by mouth with a glass of water. Follow the directions on the prescription label. You can take this medicine with or without food. Do not drink grapefruit juice with this medicine. Take your doses at regular intervals. Do not take your medicine more often than directed. Do not stop taking this medicine suddenly. This may cause serious, heart-related side effects. Your doctor will tell you how much medicine to take. If your doctor wants you to stop the medicine, the dose will be slowly lowered over time to avoid any side effects. Talk to your pediatrician regarding the use of this medicine in children. Special care may be needed. Overdosage: If you think you have taken too much of this medicine contact a poison control center or emergency room at once. NOTE: This medicine is only for you. Do not share this medicine with others. What if I miss a dose? If you miss a dose, take it as soon as you can. If it is almost time for your next dose, take only that dose. Do not take double or extra doses. What may interact with this medicine? Do not take this medicine with any of the following medications: -cimetidine -dolutegravir -hydrochlorothiazide alone or in combination with other medicines -isavuconazonium -  ketoconazole -megestrol -other medicines that prolong the QT interval (cause an abnormal heart rhythm) -prochlorperazine -trimethoprim alone or in combination with sulfamethoxazole -verapamil This medicine may also interact with the following medications: -amiloride -certain antidepressants like fluvoxamine or paroxetine -certain antiviral  medicines for HIV or AIDS like atazanavir or darunavir -certain medicines for fungal infections like clotrimazole or miconazole -digoxin -diltiazem -dronabinol, THC -grapefruit juice -metformin -nefazodone -triamterene -zafirlukast This list may not describe all possible interactions. Give your health care provider a list of all the medicines, herbs, non-prescription drugs, or dietary supplements you use. Also tell them if you smoke, drink alcohol, or use illegal drugs. Some items may interact with your medicine. What should I watch for while using this medicine? Visit your doctor or health care professional for regular checks on your progress. Wear a medical ID bracelet or chain, and carry a card that describes your disease and details of your medicine and dosage times. Check your heart rate and blood pressure regularly while you are taking this medicine. Ask your doctor or health care professional what your heart rate and blood pressure should be, and when you should contact him or her. Your doctor or health care professional also may schedule regular tests to check your progress. You will be started on this medicine in a specialized facility for at least three days. You will be monitored to find the right dose of medicine for you. It is very important that you take your medicine exactly as prescribed when you leave the hospital. The correct dosing of this medicine is very important to treat your condition and prevent possible serious side effects. What side effects may I notice from receiving this medicine? Side effects that you should report to your doctor or health care professional as soon as possible: -allergic reactions like skin rash, itching or hives, swelling of the face, lips, or tongue -breathing problems -dizziness -fast or rapid beating of the heart -feeling faint or lightheaded -swelling of the ankles -unusually weak or tired -vomiting Side effects that usually do not require  medical attention (report to your doctor or health care professional if they continue or are bothersome): -cough -diarrhea -difficulty sleeping -headache -nausea -stomach pain This list may not describe all possible side effects. Call your doctor for medical advice about side effects. You may report side effects to FDA at 1-800-FDA-1088. Where should I keep my medicine? Keep out of the reach of children. Store at room temperature between 15 and 30 degrees C (59 and 86 degrees F). Protect the medicine from moisture or humidity. Keep container tightly closed. Throw away any unused medicine after the expiration date. NOTE: This sheet is a summary. It may not cover all possible information. If you have questions about this medicine, talk to your doctor, pharmacist, or health care provider.  2018 Elsevier/Gold Standard (2016-03-18 78:[id number])

## 2017-11-28 NOTE — Progress Notes (Signed)
HPI [name] returns today for followup. He has longstanding chronic systolic heart failure, and LBBB and underwent Biv ICD implant 3 years ago. In the interim he has developed atrial fib. He does not have palpitations. His energy level is reduced. No chest pain. He has dyspnea with exertion. He has been in atrial fib for 3 months. No Known Allergies   Current Outpatient Medications  Medication Sig Dispense Refill  . acetaminophen (TYLENOL) 325 MG tablet Take 1-2 tablets (325-650 mg total) by mouth every 4 (four) hours as needed for mild pain.    Marland Kitchen. atorvastatin (LIPITOR) 40 MG tablet Take 1 tablet (40 mg total) by mouth daily. 90 tablet 3  . carvedilol (COREG) 6.25 MG tablet Take 2 tablets (12.5 mg total) by mouth 2 (two) times daily with a meal. 360 tablet 3  . furosemide (LASIX) 40 MG tablet Take 1 tablet (40 mg total) by mouth daily. 30 tablet 11  . Multiple Vitamins-Minerals (HAIR/SKIN/NAILS PO) Take 1 tablet by mouth daily.    Marland Kitchen. spironolactone (ALDACTONE) 25 MG tablet Take 1 tablet (25 mg total) by mouth daily. 90 tablet 3  . warfarin (COUMADIN) 5 MG tablet TAKE 1 TABLET DAILY, EXCEPT ON SUNDAYS TAKE 1 AND 1/2 TABLETS. (OR AS DIRECTED BY COUMADIN CLINIC) 40 tablet 3   No current facility-administered medications for this visit.      Past Medical History:  Diagnosis Date  . AICD (automatic cardioverter/defibrillator) present   . Atrial fibrillation, chronic (HCC)   . CAD (coronary artery disease)    a. s/p CABG x 3 03/2013 (L-LAD, S-RI, S-PDA[place]  . CAP (community acquired pneumonia) 08/22/2015  . CHF (congestive heart failure) (HCC)   . Chronic combined systolic and diastolic heart failure [facility](HCC) November 2014  . Coronary artery disease   . HLD (hyperlipidemia)    . Hyperbilirubinemia   . Ischemic cardiomyopathy    a. echo (10/15):  EF 20%, diff HK, apical AK, mobile density inf wall near papillary muscle (?ruptured) with mod MR, mod reduced RVSF, mild PI, PASP 37  mmHg b. s/p CRTD   . LBBB (left bundle branch block)    . LV (left ventricular) mural thrombus     a. TEE (10/15):  EF 15-20%, septum and ant walls AK, lat wall HK, no papillary muscle rupture, probable small thrombus on ant/ant-septal wall, mild MR, mod decreased RVSF, neg bubble study  . Mitral regurgitation     mild by TEE 02/2014  . NSVT (nonsustained ventricular tachycardia) (HCC)        . Obesity   . Pacemaker   . Pulmonary hypertension (HCC)   . Umbilical hernia     ROS:   All systems reviewed and negative except as noted in the HPI.   Past Surgical History:  Procedure Laterality Date  . BI-VENTRICULAR IMPLANTABLE CARDIOVERTER DEFIBRILLATOR N/A 09/01/2014   MDT CRTD implanted by Dr Joseph Carson  . CARDIAC CATHETERIZATION  03/2013  . CARDIAC CATHETERIZATION  2014  . CARDIAC DEFIBRILLATOR PLACEMENT  2016  . CORONARY ARTERY BYPASS GRAFT  03/2013   LIMA to the LAD, SVG to the ramus, SVG to the PDA  . CORONARY ARTERY BYPASS GRAFT  2014   CABG "X3"  . FRACTURE SURGERY    . INSERT / REPLACE / REMOVE PACEMAKER  2016  . PATELLA FRACTURE SURGERY Left 1972  . TEE WITHOUT CARDIOVERSION N/A 03/04/2014   Procedure: TRANSESOPHAGEAL ECHOCARDIOGRAM (TEE);  Surgeon: Joseph Moralealton S McLean, MD;  [facility];  Service: Cardiovascular;  Laterality: N/A;     Family History  Problem Relation Age of Onset  . Diabetes Mother        Deceased  . Thyroid disease Mother      Social History   Socioeconomic History  . Marital status: Divorced    Spouse name: Not on file  . Number of children: Not on file  . Years of education: Not on file  . Highest education level: Not on file  Occupational History  . Not on file  Social Needs  . Financial resource strain: Not on file  . Food insecurity:    Worry: Not on file    Inability: Not on file  . Transportation needs:    Medical: Not on file    Non-medical: Not on file  Tobacco Use  . Smoking status: Current Every Day Smoker     Packs/day: 0.30    Years: 45.00    Pack years: 13.50    Types: Cigarettes  . Smokeless tobacco: Never Used  . Tobacco comment: DOWN TO 1 CIGARETTE A DAY  Substance and Sexual Activity  . Alcohol use: No    Alcohol/week: 0.0 oz  . Drug use: No    Types: Marijuana    Comment: 03/01/2014 "used last ~ 2 months ago"  . Sexual activity: Not Currently  Lifestyle  . Physical activity:    Days per week: Not on file    Minutes per session: Not on file  . Stress: Not on file  Relationships  . Social connections:    Talks on phone: Not on file    Gets together: Not on file    Attends religious service: Not on file    Active member of club or organization: Not on file    Attends meetings of clubs or organizations: Not on file    Relationship status: Not on file  . Intimate partner violence:    Fear of current or ex partner: Not on file    Emotionally abused: Not on file    Physically abused: Not on file    Forced sexual activity: Not on file  Other Topics Concern  . Not on file  Social History Narrative   ** Merged History Encounter **         BP 108/80   Pulse 86   Ht 5\' 10"  (1.778 m)   Wt 205 lb 6.4 oz (93.2 kg)   BMI 29.47 kg/m   Physical Exam:  Well appearing middle aged man, NAD HEENT: Unremarkable Neck:  No JVD, no thyromegally Lymphatics:  No adenopathy Back:  No CVA tenderness Lungs:  Clear with no wheezes HEART:  Regular rate rhythm, no murmurs, no rubs, no clicks Abd:  soft, positive bowel sounds, no organomegally, no rebound, no guarding Ext:  2 plus pulses, no edema, no cyanosis, no clubbing Skin:  No rashes no nodules Neuro:  CN II through XII intact, motor grossly intact  EKG - atrial fib with ventricular pacing  DEVICE  Normal device function.  See PaceArt for details.   Assess/Plan: 1. Atrial fib - this is a new problem. He will undergo TEE guided DCCV as his INR was subtherapeutic. He will be started on defetilide. 2. ICD - his device is working  normally. Will recheck in several months.  3. Chronic systolic heart failure - he is worse in atrial fib. He will continue his current meds and cardiovert. 4. Coags - his INR was 8, and coumadin held and  now it is under 2. He will recquire TEE before starting dofetilide.  Lewayne Bunting, M.D.

## 2017-11-28 NOTE — Patient Instructions (Signed)
Description   Today take 1.5 tablets then continue taking 1 tablet daily except 1/2 tablet on Sundays. Recheck INR in 1 week.  Call [facility] if placed on any new medications or scheduled for any procedures - [phone].

## 2017-12-01 ENCOUNTER — Telehealth (HOSPITAL_COMMUNITY): Payer: Self-pay | Admitting: Vascular Surgery

## 2017-12-01 NOTE — Telephone Encounter (Signed)
Left pt message giving new pt appt w/ APP/NP, 7/25 @ 9 asked pt to call back to confirm appt

## 2017-12-02 ENCOUNTER — Encounter (HOSPITAL_COMMUNITY): Payer: Self-pay | Admitting: Cardiology

## 2017-12-02 ENCOUNTER — Ambulatory Visit (HOSPITAL_COMMUNITY)
Admission: RE | Admit: 2017-12-02 | Discharge: 2017-12-02 | Disposition: A | Discharge status: Home / Self Care | Payer: Medicare Other | Source: Ambulatory Visit | Attending: Cardiology | Admitting: Cardiology

## 2017-12-02 ENCOUNTER — Other Ambulatory Visit: Payer: Self-pay

## 2017-12-02 ENCOUNTER — Encounter (HOSPITAL_COMMUNITY): Payer: Self-pay | Admitting: *Deleted

## 2017-12-02 ENCOUNTER — Inpatient Hospital Stay (HOSPITAL_COMMUNITY)
Admission: RE | Admit: 2017-12-02 | Discharge: 2017-12-05 | DRG: 309 | Disposition: A | Discharge status: Home / Self Care | Payer: Medicare Other | Source: Ambulatory Visit | Attending: Internal Medicine | Admitting: Internal Medicine

## 2017-12-02 ENCOUNTER — Ambulatory Visit (HOSPITAL_BASED_OUTPATIENT_CLINIC_OR_DEPARTMENT_OTHER)
Admission: RE | Admit: 2017-12-02 | Discharge: 2017-12-02 | Disposition: A | Discharge status: Home / Self Care | Payer: Medicare Other | Source: Ambulatory Visit | Attending: Nurse Practitioner | Admitting: Nurse Practitioner

## 2017-12-02 ENCOUNTER — Encounter (HOSPITAL_COMMUNITY): Payer: Self-pay | Admitting: Nurse Practitioner

## 2017-12-02 ENCOUNTER — Ambulatory Visit (INDEPENDENT_AMBULATORY_CARE_PROVIDER_SITE_OTHER): Payer: Medicare Other | Admitting: Pharmacist

## 2017-12-02 ENCOUNTER — Encounter (HOSPITAL_COMMUNITY)
Admission: RE | Disposition: A | Discharge status: Home / Self Care | Payer: Self-pay | Source: Ambulatory Visit | Attending: Internal Medicine

## 2017-12-02 VITALS — BP 98/64 | HR 85 | Ht 70.0 in | Wt 206.0 lb

## 2017-12-02 DIAGNOSIS — I340 Nonrheumatic mitral (valve) insufficiency: Secondary | ICD-10-CM | POA: Diagnosis present

## 2017-12-02 DIAGNOSIS — Z7901 Long term (current) use of anticoagulants: Secondary | ICD-10-CM | POA: Diagnosis not present

## 2017-12-02 DIAGNOSIS — I4819 Other persistent atrial fibrillation: Secondary | ICD-10-CM

## 2017-12-02 DIAGNOSIS — E785 Hyperlipidemia, unspecified: Secondary | ICD-10-CM | POA: Diagnosis present

## 2017-12-02 DIAGNOSIS — Z538 Procedure and treatment not carried out for other reasons: Secondary | ICD-10-CM | POA: Diagnosis present

## 2017-12-02 DIAGNOSIS — I2510 Atherosclerotic heart disease of native coronary artery without angina pectoris: Secondary | ICD-10-CM | POA: Diagnosis present

## 2017-12-02 DIAGNOSIS — I5042 Chronic combined systolic (congestive) and diastolic (congestive) heart failure: Secondary | ICD-10-CM | POA: Diagnosis present

## 2017-12-02 DIAGNOSIS — T82110A Breakdown (mechanical) of cardiac electrode, initial encounter: Secondary | ICD-10-CM | POA: Diagnosis not present

## 2017-12-02 DIAGNOSIS — I272 Pulmonary hypertension, unspecified: Secondary | ICD-10-CM | POA: Diagnosis present

## 2017-12-02 DIAGNOSIS — I5022 Chronic systolic (congestive) heart failure: Secondary | ICD-10-CM | POA: Diagnosis not present

## 2017-12-02 DIAGNOSIS — I481 Persistent atrial fibrillation: Secondary | ICD-10-CM | POA: Diagnosis present

## 2017-12-02 DIAGNOSIS — Z5181 Encounter for therapeutic drug level monitoring: Secondary | ICD-10-CM | POA: Diagnosis not present

## 2017-12-02 DIAGNOSIS — Y831 Surgical operation with implant of artificial internal device as the cause of abnormal reaction of the patient, or of later complication, without mention of misadventure at the time of the procedure: Secondary | ICD-10-CM | POA: Diagnosis present

## 2017-12-02 DIAGNOSIS — T82190A Other mechanical complication of cardiac electrode, initial encounter: Secondary | ICD-10-CM | POA: Diagnosis present

## 2017-12-02 DIAGNOSIS — I255 Ischemic cardiomyopathy: Secondary | ICD-10-CM | POA: Diagnosis present

## 2017-12-02 DIAGNOSIS — Z79899 Other long term (current) drug therapy: Secondary | ICD-10-CM | POA: Diagnosis not present

## 2017-12-02 DIAGNOSIS — F17210 Nicotine dependence, cigarettes, uncomplicated: Secondary | ICD-10-CM | POA: Diagnosis present

## 2017-12-02 DIAGNOSIS — Z95810 Presence of automatic (implantable) cardiac defibrillator: Secondary | ICD-10-CM | POA: Diagnosis not present

## 2017-12-02 DIAGNOSIS — Z951 Presence of aortocoronary bypass graft: Secondary | ICD-10-CM

## 2017-12-02 DIAGNOSIS — I4510 Unspecified right bundle-branch block: Secondary | ICD-10-CM | POA: Diagnosis present

## 2017-12-02 HISTORY — PX: TEE WITHOUT CARDIOVERSION: SHX5443

## 2017-12-02 LAB — BASIC METABOLIC PANEL
ANION GAP: 12 (ref 5–15)
BUN: 21 mg/dL (ref 8–23)
CALCIUM: 9.8 mg/dL (ref 8.9–10.3)
CO2: 28 mmol/L (ref 22–32)
Chloride: 100 mmol/L (ref 98–111)
Creatinine, Ser: 1.28 mg/dL — ABNORMAL HIGH (ref 0.61–1.24)
GFR calc Af Amer: >60 mL/min (ref >60)
GFR calc non Af Amer: 58 mL/min — ABNORMAL LOW (ref >60)
Glucose, Bld: 97 mg/dL (ref 70–99)
Potassium: 4.2 mmol/L (ref 3.5–5.1)
Sodium: 140 mmol/L (ref 135–145)

## 2017-12-02 LAB — GLUCOSE, CAPILLARY: Glucose-Capillary: 135 mg/dL — ABNORMAL HIGH (ref 70–99)

## 2017-12-02 LAB — MAGNESIUM: Magnesium: 2 mg/dL (ref 1.7–2.4)

## 2017-12-02 LAB — POCT INR: INR: 2.6 (ref 2.0–3.0)

## 2017-12-02 SURGERY — ECHOCARDIOGRAM, TRANSESOPHAGEAL
Anesthesia: Moderate Sedation

## 2017-12-02 MED ORDER — CARVEDILOL 12.5 MG PO TABS
12.5000 mg | ORAL_TABLET | Freq: Two times a day (BID) | ORAL | Status: DC
  Administered 2017-12-02 – 2017-12-05 (×6): 12.5 mg via ORAL
  Filled 2017-12-02 (×6): qty 1

## 2017-12-02 MED ORDER — DOFETILIDE 500 MCG PO CAPS
500.0000 ug | ORAL_CAPSULE | Freq: Two times a day (BID) | ORAL | Status: DC
  Administered 2017-12-02 – 2017-12-05 (×6): 500 ug via ORAL
  Filled 2017-12-02 (×6): qty 1

## 2017-12-02 MED ORDER — FUROSEMIDE 40 MG PO TABS
40.0000 mg | ORAL_TABLET | Freq: Every day | ORAL | Status: DC
  Administered 2017-12-03 – 2017-12-05 (×3): 40 mg via ORAL
  Filled 2017-12-02 (×3): qty 1

## 2017-12-02 MED ORDER — FENTANYL CITRATE (PF) 100 MCG/2ML IJ SOLN
INTRAMUSCULAR | Status: DC | PRN
  Administered 2017-12-02: 25 ug via INTRAVENOUS

## 2017-12-02 MED ORDER — FENTANYL CITRATE (PF) 100 MCG/2ML IJ SOLN
INTRAMUSCULAR | Status: AC
Start: 2017-12-02 — End: ?
  Filled 2017-12-02: qty 2

## 2017-12-02 MED ORDER — SODIUM CHLORIDE 0.9 % IV SOLN: 250.0000 mL | INTRAVENOUS | Status: DC | PRN

## 2017-12-02 MED ORDER — MIDAZOLAM HCL 5 MG/ML IJ SOLN
INTRAMUSCULAR | Status: DC | PRN
  Administered 2017-12-02: 2 mg via INTRAVENOUS

## 2017-12-02 MED ORDER — WARFARIN SODIUM 5 MG PO TABS
5.0000 mg | ORAL_TABLET | Freq: Once | ORAL | Status: AC
  Administered 2017-12-02: 5 mg via ORAL
  Filled 2017-12-02: qty 1

## 2017-12-02 MED ORDER — BUTAMBEN-TETRACAINE-BENZOCAINE 2-2-14 % EX AERO
INHALATION_SPRAY | CUTANEOUS | Status: DC | PRN
  Administered 2017-12-02: 2 via TOPICAL

## 2017-12-02 MED ORDER — WARFARIN SODIUM 5 MG PO TABS: 5.0000 mg | ORAL_TABLET | Freq: Every day | ORAL | Status: DC

## 2017-12-02 MED ORDER — WARFARIN - PHARMACIST DOSING INPATIENT
Freq: Every day | Status: DC
  Administered 2017-12-02 – 2017-12-04 (×3)

## 2017-12-02 MED ORDER — ATORVASTATIN CALCIUM 40 MG PO TABS
40.0000 mg | ORAL_TABLET | Freq: Every day | ORAL | Status: DC
Start: 2017-12-03 — End: 2017-12-05
  Administered 2017-12-03 – 2017-12-05 (×3): 40 mg via ORAL
  Filled 2017-12-02 (×3): qty 1

## 2017-12-02 MED ORDER — SODIUM CHLORIDE 0.9% FLUSH
3.0000 mL | INTRAVENOUS | Status: DC | PRN
  Administered 2017-12-04: 3 mL via INTRAVENOUS
  Filled 2017-12-02: qty 3

## 2017-12-02 MED ORDER — MIDAZOLAM HCL 5 MG/ML IJ SOLN
INTRAMUSCULAR | Status: AC
  Filled 2017-12-02: qty 2

## 2017-12-02 MED ORDER — SPIRONOLACTONE 25 MG PO TABS
25.0000 mg | ORAL_TABLET | Freq: Every day | ORAL | Status: DC
  Administered 2017-12-03 – 2017-12-05 (×3): 25 mg via ORAL
  Filled 2017-12-02 (×3): qty 1

## 2017-12-02 MED ORDER — ACETAMINOPHEN 325 MG PO TABS
325.0000 mg | ORAL_TABLET | ORAL | Status: DC | PRN
Start: 2017-12-02 — End: 2017-12-05

## 2017-12-02 MED ORDER — SODIUM CHLORIDE 0.9 % IV SOLN: INTRAVENOUS | Status: DC

## 2017-12-02 MED ORDER — SODIUM CHLORIDE 0.9% FLUSH
3.0000 mL | Freq: Two times a day (BID) | INTRAVENOUS | Status: DC
  Administered 2017-12-02 – 2017-12-05 (×6): 3 mL via INTRAVENOUS

## 2017-12-02 NOTE — H&P (Addendum)
Cardiology Admission History and Physical:   Patient ID: Joseph Carson; MRN: [id number]; DOB: 1955-05-30   Admission date: 12/02/2017  Primary Care Provider: System, Pcp Not In Primary Cardiologist/Primary Electrophysiologist:  Dr. Ladona Ridgel  Chief Complaint:  Tikosyn initiation  Patient Profile:   Joseph Carson is a 62 y.o. male with a history of CAD (CABG), ICM, LBBB w/CRT-D, chronic CHF (systolic), hx of remote LV thrombus, and PAFib.  History of Present Illness:   [name] is s/p TEE with plans for Tikosyn initiation.  He was seen today in the AFib clinic for facilitation of this plan.    Uvaldo Rising, NP noted on device interrogation  He has RV diaphragmatic stim and has been programmed subthreshold (this resulted in initial QRS morphology).  RV diaphragmatic stim is not present at less than 1V@0 . .  Threshold 0.5V @0 . .  Programmed RV lead to 0.5V@1msec  today with no diaphragmatic stim noted. Can consider reprogramming to subthreshold once back in SR but because of sensing issues when programmed this way, he was binning in the VF zone on presenting rhythm today.    He is on warfarin for a/c.  INR today is 2.6, TEE was done with no thrombus noted.  The patient feels poorly, generally weak with the AFib, no c/o CP or SOB  Device information: MDT CRT-D, implanted4/14/16   Past Medical History:  Diagnosis Date  . AICD (automatic cardioverter/defibrillator) present   . Atrial fibrillation, chronic (HCC)   . CAD (coronary artery disease)    a. s/p CABG x 3 03/2013 (L-LAD, S-RI, S-PDA) in [place]  . CAP (community acquired pneumonia) 08/22/2015  . CHF (congestive heart failure) (HCC)   . Chronic combined systolic and diastolic heart failure [facility]) November 2014  . Coronary artery disease   . HLD (hyperlipidemia)    . Hyperbilirubinemia   . Ischemic cardiomyopathy    a. echo (10/15):  EF 20%, diff HK, apical AK, mobile density inf wall near papillary muscle  (?ruptured) with mod MR, mod reduced RVSF, mild PI, PASP 37 mmHg b. s/p CRTD   . LBBB (left bundle branch block)    . LV (left ventricular) mural thrombus     a. TEE (10/15):  EF 15-20%, septum and ant walls AK, lat wall HK, no papillary muscle rupture, probable small thrombus on ant/ant-septal wall, mild MR, mod decreased RVSF, neg bubble study  . Mitral regurgitation     mild by TEE 02/2014  . NSVT (nonsustained ventricular tachycardia) (HCC)        . Obesity   . Pacemaker   . Pulmonary hypertension (HCC)   . Umbilical hernia     Past Surgical History:  Procedure Laterality Date  . BI-VENTRICULAR IMPLANTABLE CARDIOVERTER DEFIBRILLATOR N/A 09/01/2014   MDT CRTD implanted by Dr Ladona Ridgel  . CARDIAC CATHETERIZATION  03/2013  . CARDIAC CATHETERIZATION  2014  . CARDIAC DEFIBRILLATOR PLACEMENT  2016  . CORONARY ARTERY BYPASS GRAFT  03/2013   LIMA to the LAD, SVG to the ramus, SVG to the PDA  . CORONARY ARTERY BYPASS GRAFT  2014   CABG "X3"  . FRACTURE SURGERY    . INSERT / REPLACE / REMOVE PACEMAKER  2016  . PATELLA FRACTURE SURGERY Left 1972  . TEE WITHOUT CARDIOVERSION N/A 03/04/2014   Procedure: TRANSESOPHAGEAL ECHOCARDIOGRAM (TEE);  Surgeon: Laurey Morale, MD;  [facility] END[facility]COPY;  Service: Cardiovascular;  Laterality: N/A;     Medications Prior to Admission: Prior to Admission medications   Medication Sig Start Date  End Date Taking? Authorizing Provider  acetaminophen (TYLENOL) 325 MG tablet Take 1-2 tablets (325-650 mg total) by mouth every 4 (four) hours as needed for mild pain. 09/02/14  Yes Leone Brand, NP  atorvastatin (LIPITOR) 40 MG tablet Take 1 tablet (40 mg total) by mouth daily. 12/25/16  Yes Marinus Maw, MD  carvedilol (COREG) 6.25 MG tablet Take 2 tablets (12.5 mg total) by mouth 2 (two) times daily with a meal. 12/25/16  Yes Marinus Maw, MD  furosemide (LASIX) 40 MG tablet Take 1 tablet (40 mg total) by mouth daily. 12/05/16  Yes Marinus Maw, MD    Multiple Vitamins-Minerals (HAIR/SKIN/NAILS PO) Take 1 tablet by mouth daily.   Yes [provider]  spironolactone (ALDACTONE) 25 MG tablet Take 1 tablet (25 mg total) by mouth daily. 12/25/16  Yes Marinus Maw, MD  warfarin (COUMADIN) 5 MG tablet TAKE 1 TABLET DAILY, EXCEPT ON SUNDAYS TAKE 1 AND 1/2 TABLETS. (OR AS DIRECTED BY COUMADIN CLINIC) 07/17/17  Yes Laurey Morale, MD     Allergies:   No Known Allergies  Social History:   Social History   Socioeconomic History  . Marital status: Divorced    Spouse name: Not on file  . Number of children: Not on file  . Years of education: Not on file  . Highest education level: Not on file  Occupational History  . Not on file  Social Needs  . Financial resource strain: Not on file  . Food insecurity:    Worry: Not on file    Inability: Not on file  . Transportation needs:    Medical: Not on file    Non-medical: Not on file  Tobacco Use  . Smoking status: Current Every Day Smoker    Packs/day: 0.30    Years: 45.00    Pack years: 13.50    Types: Cigarettes  . Smokeless tobacco: Never Used  . Tobacco comment: DOWN TO 1 CIGARETTE A DAY  Substance and Sexual Activity  . Alcohol use: No    Alcohol/week: 0.0 oz  . Drug use: No    Types: Marijuana    Comment: 03/01/2014 "used last ~ 2 months ago"  . Sexual activity: Not Currently  Lifestyle  . Physical activity:    Days per week: Not on file    Minutes per session: Not on file  . Stress: Not on file  Relationships  . Social connections:    Talks on phone: Not on file    Gets together: Not on file    Attends religious service: Not on file    Active member of club or organization: Not on file    Attends meetings of clubs or organizations: Not on file    Relationship status: Not on file  . Intimate partner violence:    Fear of current or ex partner: Not on file    Emotionally abused: Not on file    Physically abused: Not on file    Forced sexual activity: Not on  file  Other Topics Concern  . Not on file  Social History Narrative   ** Merged History Encounter **        Family History:   The patient's family history includes Diabetes in his mother; Thyroid disease in his mother.    ROS:  Please see the history of present illness.  All other ROS reviewed and negative.     Physical Exam/Data:   Vitals:   12/02/17 1450 12/02/17 1452 12/02/17  1455 12/02/17 1501  BP: (!) 86/58 91/63 (!) 89/53 103/67  Pulse: 84 86 70 88  Resp: (!) 27 (!) 27 (!) 26 (!) 26  Temp:      TempSrc:      SpO2: 99% 97% 97% 99%   No intake or output data in the 24 hours ending 12/02/17 1514 There were no vitals filed for this visit. There is no height or weight on file to calculate BMI.  General:  Well nourished, well developed, in no acute distress HEENT: normal Lymph: no adenopathy Neck: no JVD Endocrine:  No thryomegaly Vascular: No carotid bruits Cardiac:  irreg-irreg; no murmurs, gallops or rubs Lungs:  CTA b/l, no wheezing, rhonchi or rales  Abd: soft, nontender, no hepatomegaly  Ext: no edema Musculoskeletal:  No deformities, BUE and BLE strength normal and equal Skin: warm and dry  Neuro:  CNs 2-12 intact, no focal abnormalities noted Psych:  Normal affect    EKG:  The ECG that was done today post TEE was personally reviewed and demonstrates  AFib 84, RBBB, QRS 210ms, QT measured by myself is 440ms, correct to 521ms, give QRS 210ms is acceptable  Relevant CV Studies:  12/02/17 TEE No LA/LAA or RA thrombus visualized. Depressed LV EF   06/16/14: TTE Study Conclusions - Left ventricle: The cavity size was severely dilated. The estimated ejection fraction was 20%. Diffuse hypokinesis. Doppler parameters are consistent with abnormal left ventricular relaxation (grade 1 diastolic dysfunction). - Aortic valve: There was no stenosis. - Mitral valve: There was trivial regurgitation. - Left atrium: The atrium was moderately dilated. - Right  ventricle: The cavity size was normal. Systolic function was moderately reduced. - Tricuspid valve: Peak RV-RA gradient (S): 13 mm Hg. - Pulmonary arteries: PA peak pressure: 16 mm Hg (S). - Inferior vena cava: The vessel was normal in size. The respirophasic diameter changes were in the normal range (>= 50%), consistent with normal central venous pressure. Impressions - Severely dilated LV with EF 15%, diffuse hypokinesis. Normal RV size with moderate systolic dysfunction. No significant valvular abnormalities.  Laboratory Data:  Chemistry Recent Labs  Lab 11/27/17 0735 12/02/17 1320  NA 139 140  K 4.2 4.2  CL 104 100  CO2 24 28  GLUCOSE 104* 97  BUN 17 21  CREATININE 1.23 1.28*  CALCIUM 9.7 9.8  GFRNONAA >60 58*  GFRAA >60 >60  ANIONGAP 11 12    Recent Labs  Lab 11/27/17 0735  PROT 7.2  ALBUMIN 3.7  AST 20  ALT 16  ALKPHOS 72  BILITOT 1.6*   Hematology Recent Labs  Lab 11/27/17 0735  WBC 9.1  RBC 5.21  HGB 15.0  HCT 46.5  MCV 89.3  MCH 28.8  MCHC 32.3  RDW 14.6  PLT 172   Cardiac EnzymesNo results for input(s): TROPONINI in the last 168 hours.  Recent Labs  Lab 11/27/17 0740 11/27/17 1311  TROPIPOC 0.02 0.01    BNP Recent Labs  Lab 11/27/17 0735  BNP 540.8*    DDimer No results for input(s): DDIMER in the last 168 hours.  Radiology/Studies:  No results found.  Assessment and Plan:   1. Persistent AFib     CHA2DS2Vasc is 2, on warfarin     K+ 4.2     Mag 2.0     Creat 1.28 (Calc CrCl is 79)     INR 2.6     QTc is acceptable given RBBB/prlonged QRS, and ICD in place  Plan DCCV Thursday if not  in SR  2. CAD     No anginal symptoms  3. ICM     Exam does not suggest fluid Ol     Continue home meds  4. MDT CRT-D     Will programming, review with Dr. Ladona Ridgel   For questions or updates, please contact [facility] Please consult [email or web address] for contact info under Cardiology/STEMI.    Signed, Sheilah Pigeon,  PA-C  12/02/2017 3:14 PM   EP Attending  Patient seen and examined. Agree with the findings as noted above. The patient is admitted for initiation of dofetilide. Because his INR was subtherapeutic last week, he has undergone TEE which demonstrates that there was not LAA thrombus. His ICD lead has diaphragmatic stimulation and has been reprogrammed. We may need to go ahead and revise his lead while he is in the hospital. We will follow his electrolytes and his QT interval and renal function.   Leonia Reeves.D.

## 2017-12-02 NOTE — Addendum Note (Signed)
Encounter addended by: Seiler, Amber K, NP on: 12/02/2017 2:50 PM  Actions taken: Charge Capture section accepted

## 2017-12-02 NOTE — Progress Notes (Signed)
ANTICOAGULATION CONSULT NOTE - Initial Consult  Pharmacy Consult for Warfarin Indication: atrial fibrillation and pulmonary embolus  No Known Allergies  Patient Measurements:   Heparin Dosing Weight: n/a  Vital Signs: Temp: 97.8 F (36.6 C) (07/16 1403) Temp Source: Oral (07/16 1403) BP: 94/65 (07/16 1610) Pulse Rate: 78 (07/16 1610)  Labs: Recent Labs    12/02/17 1146 12/02/17 1320  INR 2.6  --   CREATININE  --  1.28*    Estimated Creatinine Clearance: 68.7 mL/min (A) (by C-G formula based on SCr of 1.28 mg/dL (H)).   Medical History: Past Medical History:  Diagnosis Date  . AICD (automatic cardioverter/defibrillator) present   . Atrial fibrillation, chronic (HCC)   . CAD (coronary artery disease)    a. s/p CABG x 3 03/2013 (L-LAD, S-RI, S-PDA[place] in [place]  . CAP (community acquired pneumonia) 08/22/2015  . CHF (congestive heart failure) (HCC)   . Chronic combined systolic and diastolic heart failure [facility](HCC) November 2014  . Coronary artery disease   . HLD (hyperlipidemia)    . Hyperbilirubinemia   . Ischemic cardiomyopathy    a. echo (10/15):  EF 20%, diff HK, apical AK, mobile density inf wall near papillary muscle (?ruptured) with mod MR, mod reduced RVSF, mild PI, PASP 37 mmHg b. s/p CRTD   . LBBB (left bundle branch block)    . LV (left ventricular) mural thrombus     a. TEE (10/15):  EF 15-20%, septum and ant walls AK, lat wall HK, no papillary muscle rupture, probable small thrombus on ant/ant-septal wall, mild MR, mod decreased RVSF, neg bubble study  . Mitral regurgitation     mild by TEE 02/2014  . NSVT (nonsustained ventricular tachycardia) (HCC)        . Obesity   . Pacemaker   . Pulmonary hypertension (HCC)   . Umbilical hernia     Medications:  Scheduled:  . [START ON 12/03/2017] atorvastatin  40 mg Oral Daily  . carvedilol  12.5 mg Oral BID WC  . dofetilide  500 mcg Oral BID  . [START ON 12/03/2017] furosemide  40 mg Oral Daily  . sodium chloride  flush  3 mL Intravenous Q12H  . [START ON 12/03/2017] spironolactone  25 mg Oral Daily  . warfarin  5 mg Oral ONCE-1800  . Warfarin - Pharmacist Dosing Inpatient   Does not apply q1800    Assessment: 62 yo male admitted for tikosyn initiation, on chronic Coumadin for afib.  INR within goal range today on home Coumadin dose of 5 mg daily except 7.5 mg on Sundays.  Has not had dose today per patient report.  Goal of Therapy:  INR 2-3 Monitor platelets by anticoagulation protocol: Yes   Plan:  1. Coumadin 5 mg x 1 tonight. 2. Daily INR.  Jenetta DownerJessica Cyanne Delmar, Pharm D, BCPS, [facility] Clinical Pharmacist Phone [phone]  12/02/2017 5:07 PM

## 2017-12-02 NOTE — Interval H&P Note (Signed)
History and Physical Interval Note:  12/02/2017 2:40 PM  [address]  has presented today for surgery, with the diagnosis of AFIB  The various methods of treatment have been discussed with the patient and family. After consideration of risks, benefits and other options for treatment, the patient has consented to  Procedure(s): TRANSESOPHAGEAL ECHOCARDIOGRAM (TEE) (N/A) as a surgical intervention .  The patient's history has been reviewed, patient examined, no change in status, stable for surgery.  I have reviewed the patient's chart and labs.  Questions were answered to the patient's satisfaction.     Ranie Chinchilla Cristal Deerhristopher

## 2017-12-02 NOTE — Progress Notes (Signed)
  Echocardiogram 2D Echocardiogram has been performed.  Celene SkeenVijay  Georgetta Crafton 12/02/2017, 3:18 PM

## 2017-12-02 NOTE — Progress Notes (Signed)
Pharmacy Review for Dofetilide (Tikosyn) Initiation  Admit Complaint: 62 y.o. male admitted 12/02/2017 with atrial fibrillation to be initiated on dofetilide.   Assessment:  Patient Exclusion Criteria: If any screening criteria checked as "Yes", then  patient  should NOT receive dofetilide until criteria item is corrected. If "Yes" please indicate correction plan.  YES  NO Patient  Exclusion Criteria Correction Plan  [x]  []  Baseline QTc interval is greater than or equal to 440 msec. IF above YES box checked dofetilide contraindicated unless patient has ICD; then may proceed if QTc 500-550 msec or with known ventricular conduction abnormalities may proceed with QTc 550-600 msec. QTc =  626 Per MD - QTc is acceptable given RBBB/prlonged QRS, and ICD in place    []  [x]  Magnesium level is less than 1.8 mEq/l : Last magnesium:  Lab Results  Component Value Date   MG 2.0 12/02/2017         []  [x]  Potassium level is less than 4 mEq/l : Last potassium:  Lab Results  Component Value Date   K 4.2 12/02/2017         []  [x]  Patient is known or suspected to have a digoxin level greater than 2 ng/ml: No results found for: DIGOXIN    []  [x]  Creatinine clearance less than 20 ml/min (calculated using Cockcroft-Gault, actual body weight and serum creatinine): Estimated Creatinine Clearance: 68.7 mL/min (A) (by C-G formula based on SCr of 1.28 mg/dL (H)).    []  [x]  Patient has received drugs known to prolong the QT intervals within the last 48 hours (phenothiazines, tricyclics or tetracyclic antidepressants, erythromycin, H-1 antihistamines, cisapride, fluoroquinolones, azithromycin). Drugs not listed above may have an, as yet, undetected potential to prolong the QT interval, updated information on QT prolonging agents is available at this website:QT prolonging agents   []  [x]  Patient received a dose of hydrochlorothiazide (Oretic) alone or in any combination including triamterene (Dyazide, Maxzide)  in the last 48 hours.   []  [x]  Patient received a medication known to increase dofetilide plasma concentrations prior to initial dofetilide dose:  . Trimethoprim (Primsol, Proloprim) in the last 36 hours . Verapamil (Calan, Verelan) in the last 36 hours or a sustained release dose in the last 72 hours . Megestrol (Megace) in the last 5 days  . Cimetidine (Tagamet) in the last 6 hours . Ketoconazole (Nizoral) in the last 24 hours . Itraconazole (Sporanox) in the last 48 hours  . Prochlorperazine (Compazine) in the last 36 hours    []  [x]  Patient is known to have a history of torsades de pointes; congenital or acquired long QT syndromes.   []  [x]  Patient has received a Class 1 antiarrhythmic with less than 2 half-lives since last dose. (Disopyramide, Quinidine, Procainamide, Lidocaine, Mexiletine, Flecainide, Propafenone)   []  [x]  Patient has received amiodarone therapy in the past 3 months or amiodarone level is greater than 0.3 ng/ml.    Patient has been appropriately anticoagulated with warfarin.  Ordering provider was confirmed at [email or web address] if they are not listed on the [facility] Authorized Prescribers list.  Goal of Therapy: Follow renal function, electrolytes, potential drug interactions, and dose adjustment. Provide education and 1 week supply at discharge.  Plan:  [x]   Physician selected initial dose within range recommended for patients level of renal function - will monitor for response.  []   Physician selected initial dose outside of range recommended for patients level of renal function - will discuss if the dose should be altered at this time.  Select One Calculated CrCl  Dose q12h  [x]  > 60 ml/min 500 mcg  []  40-60 ml/min 250 mcg  []  20-40 ml/min 125 mcg   2. Follow up QTc after the first 5 doses, renal function, electrolytes (K & Mg) daily x 3     days, dose adjustment, success of initiation and facilitate 1 week discharge supply as     clinically indicated.  3.  Initiate Tikosyn education video (Call [phone] and ask for Tikosyn Video # 116).  4. Place Enrollment Form on the chart for discharge supply of dofetilide.  Jenetta DownerJessica Sherral Dirocco, Pharm D, BCPS, [facility] Clinical Pharmacist Phone [phone]) [phone]  12/02/2017 5:02 PM

## 2017-12-02 NOTE — CV Procedure (Signed)
Brief TEE report, full report to be filed in [facility]:  No LA/LAA or RA thrombus visualized. Depressed LV EF  During this procedure the patient is administered a total of Versed 2 mg and Fentanyl 25 mcg to achieve and maintain moderate conscious sedation.  The patient's heart rate, blood pressure, and oxygen saturation are monitored continuously during the procedure. The period of conscious sedation is 17 minutes, of which I was present face-to-face 100% of this time.  Jodelle RedBridgette Elaijah Munoz, MD  3:04 PM

## 2017-12-02 NOTE — Progress Notes (Signed)
Primary Care Physician: System, Pcp Not In Primary Electrophysiologist: Jaymes Hang is a 62 y.o. male with a history of persistent atrial fibrillation who presents for consultation in the [facility]. He was seen by Dr Ladona Ridgel last week. He has been feeling poorly with persistent AF and has been referred for TEE and Tikosyn initiation.  He is symptomatic with exercise intolerance, fatigue, and shortness of breath with exertion.   Past Medical History:  Diagnosis Date  . AICD (automatic cardioverter/defibrillator) present   . Atrial fibrillation, chronic (HCC)   . CAD (coronary artery disease)    a. s/p CABG x 3 03/2013 (L-LAD, S-RI, S-PDA) in [place]  . CAP (community acquired pneumonia) 08/22/2015  . CHF (congestive heart failure) (HCC)   . Chronic combined systolic and diastolic heart failure [facility]) November 2014  . Coronary artery disease   . HLD (hyperlipidemia)    . Hyperbilirubinemia   . Ischemic cardiomyopathy    a. echo (10/15):  EF 20%, diff HK, apical AK, mobile density inf wall near papillary muscle (?ruptured) with mod MR, mod reduced RVSF, mild PI, PASP 37 mmHg b. s/p CRTD   . LBBB (left bundle branch block)    . LV (left ventricular) mural thrombus     a. TEE (10/15):  EF 15-20%, septum and ant walls AK, lat wall HK, no papillary muscle rupture, probable small thrombus on ant/ant-septal wall, mild MR, mod decreased RVSF, neg bubble study  . Mitral regurgitation     mild by TEE 02/2014  . NSVT (nonsustained ventricular tachycardia) (HCC)        . Obesity   . Pacemaker   . Pulmonary hypertension (HCC)   . Umbilical hernia    Past Surgical History:  Procedure Laterality Date  . BI-VENTRICULAR IMPLANTABLE CARDIOVERTER DEFIBRILLATOR N/A 09/01/2014   MDT CRTD implanted by Dr Ladona Ridgel  . CARDIAC CATHETERIZATION  03/2013  . CARDIAC CATHETERIZATION  2014  . CARDIAC DEFIBRILLATOR PLACEMENT  2016  . CORONARY ARTERY BYPASS GRAFT   03/2013   LIMA to the LAD, SVG to the ramus, SVG to the PDA  . CORONARY ARTERY BYPASS GRAFT  2014   CABG "X3"  . FRACTURE SURGERY    . INSERT / REPLACE / REMOVE PACEMAKER  2016  . PATELLA FRACTURE SURGERY Left 1972  . TEE WITHOUT CARDIOVERSION N/A 03/04/2014   Procedure: TRANSESOPHAGEAL ECHOCARDIOGRAM (TEE);  Surgeon: Laurey Morale, MD;  Location: [facility] ENDOSCOPY;  Service: Cardiovascular;  Laterality: N/A;    No current facility-administered medications for this encounter.    No current outpatient medications on file.   Facility-Administered Medications Ordered in Other Encounters  Medication Dose Route Frequency Provider Last Rate Last Dose  . 0.9 %  sodium chloride infusion   Intravenous Continuous Marinus Maw, MD        No Known Allergies  Social History   Socioeconomic History  . Marital status: Divorced    Spouse name: Not on file  . Number of children: Not on file  . Years of education: Not on file  . Highest education level: Not on file  Occupational History  . Not on file  Social Needs  . Financial resource strain: Not on file  . Food insecurity:    Worry: Not on file    Inability: Not on file  . Transportation needs:    Medical: Not on file    Non-medical: Not on file  Tobacco Use  . Smoking  status: Current Every Day Smoker    Packs/day: 0.30    Years: 45.00    Pack years: 13.50    Types: Cigarettes  . Smokeless tobacco: Never Used  . Tobacco comment: DOWN TO 1 CIGARETTE A DAY  Substance and Sexual Activity  . Alcohol use: No    Alcohol/week: 0.0 oz  . Drug use: No    Types: Marijuana    Comment: 03/01/2014 "used last ~ 2 months ago"  . Sexual activity: Not Currently  Lifestyle  . Physical activity:    Days per week: Not on file    Minutes per session: Not on file  . Stress: Not on file  Relationships  . Social connections:    Talks on phone: Not on file    Gets together: Not on file    Attends religious service: Not on file    Active  member of club or organization: Not on file    Attends meetings of clubs or organizations: Not on file    Relationship status: Not on file  . Intimate partner violence:    Fear of current or ex partner: Not on file    Emotionally abused: Not on file    Physically abused: Not on file    Forced sexual activity: Not on file  Other Topics Concern  . Not on file  Social History Narrative   ** Merged History Encounter **        Family History  Problem Relation Age of Onset  . Diabetes Mother        Deceased  . Thyroid disease Mother    The patient does not have a history of early familial atrial fibrillation or other arrhythmias.  ROS- All systems are reviewed and negative except as per the HPI above.  Physical Exam: Vitals:   12/02/17 1259  BP: 98/64  Pulse: 85  Weight: 206 lb (93.4 kg)  Height: 5\' 10"  (1.778 m)    GEN- The patient is elderly appearing, alert and oriented x 3 today.   Head- normocephalic, atraumatic Eyes-  Sclera clear, conjunctiva pink Ears- hearing intact Oropharynx- clear Neck- supple  Lungs- Clear to ausculation bilaterally, normal work of breathing Heart- Irregular rate and rhythm  GI- soft, NT, ND, + BS Extremities- no clubbing, cyanosis, or edema MS- no significant deformity or atrophy Skin- no rash or lesion Psych- euthymic mood, full affect Neuro- strength and sensation are intact  Wt Readings from Last 3 Encounters:  12/02/17 206 lb (93.4 kg)  11/28/17 205 lb 6.4 oz (93.2 kg)  11/18/16 224 lb 6.4 oz (101.8 kg)    EKG today demonstrates atrial fibrillation, LBBB, QRS 184msec, QT 440msec  Epic records are reviewed at length today  Assessment and Plan:  1. Persistent atrial fibrillation The patient has symptomatic persistent atrial fibrillation He has been on Warfarin but had a subtherapeutic INR last week. TEE planned for today with elective Tikosyn admission to follow. Underlying EKG reviewed with Dr Ladona Ridgelaylor today. When accounting for  QRS duration, QTc acceptable to start Tikosyn. All computer generated EKG readings will likely be inaccurate.   BMET, Mg drawn today   2. Chronic systolic heart failure Worsened with persistent AF He has a MDT CRTD implanted.  He has RV diaphragmatic stim and has been programmed subthreshold (this resulted in initial QRS morphology).  RV diaphragmatic stim is not present at less than 1V@0 .4msec.  Threshold 0.5V @0 .4msec.  Programmed RV lead to 0.5V@1msec  today with no diaphragmatic stim noted. Can consider reprogramming  to subthreshold once back in SR but because of sensing issues when programmed this way, he was binning in the VF zone on presenting rhythm today.  Continue current meds Would update echo after 3 months of SR  3.  CAD No recent ischemic symptoms Continue current therapy  Gypsy Balsam, NP 12/02/2017 1:55 PM

## 2017-12-02 NOTE — Patient Instructions (Signed)
Description   Continue taking 1 tablet daily except 1/2 tablet on Sundays. Recheck INR in 1 week.  Call Coumadin Clinic if placed on any new medications or scheduled for any procedures - [phone].

## 2017-12-03 ENCOUNTER — Encounter (HOSPITAL_COMMUNITY): Payer: Self-pay | Admitting: General Practice

## 2017-12-03 ENCOUNTER — Other Ambulatory Visit: Payer: Self-pay

## 2017-12-03 LAB — SURGICAL PCR SCREEN
MRSA, PCR: NEGATIVE
Staphylococcus aureus: NEGATIVE

## 2017-12-03 LAB — BASIC METABOLIC PANEL
ANION GAP: 6 (ref 5–15)
BUN: 26 mg/dL — ABNORMAL HIGH (ref 8–23)
CALCIUM: 9 mg/dL (ref 8.9–10.3)
CHLORIDE: 102 mmol/L (ref 98–111)
CO2: 29 mmol/L (ref 22–32)
Creatinine, Ser: 1.28 mg/dL — ABNORMAL HIGH (ref 0.61–1.24)
GFR calc non Af Amer: 58 mL/min — ABNORMAL LOW (ref >60)
GLUCOSE: 105 mg/dL — AB (ref 70–99)
POTASSIUM: 4.1 mmol/L (ref 3.5–5.1)
Sodium: 137 mmol/L (ref 135–145)

## 2017-12-03 LAB — PROTIME-INR
INR: 2.63
PROTHROMBIN TIME: 27.9 s — AB (ref 11.4–15.2)

## 2017-12-03 LAB — MAGNESIUM: Magnesium: 1.8 mg/dL (ref 1.7–2.4)

## 2017-12-03 MED ORDER — MAGNESIUM SULFATE IN D5W 1-5 GM/100ML-% IV SOLN
1.0000 g | Freq: Once | INTRAVENOUS | Status: AC
  Administered 2017-12-03: 1 g via INTRAVENOUS
  Filled 2017-12-03: qty 100

## 2017-12-03 MED ORDER — ENSURE ENLIVE PO LIQD
237.0000 mL | Freq: Two times a day (BID) | ORAL | Status: DC
  Administered 2017-12-03 – 2017-12-05 (×2): 237 mL via ORAL

## 2017-12-03 MED ORDER — WARFARIN SODIUM 5 MG PO TABS
5.0000 mg | ORAL_TABLET | Freq: Once | ORAL | Status: AC
  Administered 2017-12-03: 5 mg via ORAL
  Filled 2017-12-03: qty 1

## 2017-12-03 NOTE — Progress Notes (Signed)
ANTICOAGULATION CONSULT NOTE - Follow up Consult  Pharmacy Consult for Warfarin Indication: atrial fibrillation and pulmonary embolus  No Known Allergies  Patient Measurements: Height: 5\' 10"  (177.8 cm) Weight: 206 lb 6.4 oz (93.6 kg) IBW/kg (Calculated) : 73 Heparin Dosing Weight: n/a  Vital Signs: Temp: 97.7 F (36.5 C) (07/17 0449) Temp Source: Oral (07/17 0449) BP: 97/73 (07/17 0449) Pulse Rate: 80 (07/17 0449)  Labs: Recent Labs    12/02/17 1146 12/02/17 1320 12/03/17 0422  LABPROT  --   --  27.9*  INR 2.6  --  2.63  CREATININE  --  1.28* 1.28*    Estimated Creatinine Clearance: 68.7 mL/min (A) (by C-G formula based on SCr of 1.28 mg/dL (H)).   Medical History: Past Medical History:  Diagnosis Date  . AICD (automatic cardioverter/defibrillator) present   . Atrial fibrillation, chronic (HCC)   . CAD (coronary artery disease)    a. s/p CABG x 3 03/2013 (L-LAD, S-RI, S-PDA) in [place]  . CAP (community acquired pneumonia) 08/22/2015  . CHF (congestive heart failure) (HCC)   . Chronic combined systolic and diastolic heart failure [facility](HCC) November 2014  . Coronary artery disease   . HLD (hyperlipidemia)    . Hyperbilirubinemia   . Ischemic cardiomyopathy    a. echo (10/15):  EF 20%, diff HK, apical AK, mobile density inf wall near papillary muscle (?ruptured) with mod MR, mod reduced RVSF, mild PI, PASP 37 mmHg b. s/p CRTD   . LBBB (left bundle branch block)    . LV (left ventricular) mural thrombus     a. TEE (10/15):  EF 15-20%, septum and ant walls AK, lat wall HK, no papillary muscle rupture, probable small thrombus on ant/ant-septal wall, mild MR, mod decreased RVSF, neg bubble study  . Mitral regurgitation     mild by TEE 02/2014  . NSVT (nonsustained ventricular tachycardia) (HCC)        . Obesity   . Pacemaker   . Pulmonary hypertension (HCC)   . Umbilical hernia     Medications:  Scheduled:  . atorvastatin  40 mg Oral Daily  . carvedilol  12.5 mg  Oral BID WC  . dofetilide  500 mcg Oral BID  . furosemide  40 mg Oral Daily  . sodium chloride flush  3 mL Intravenous Q12H  . spironolactone  25 mg Oral Daily  . Warfarin - Pharmacist Dosing Inpatient   Does not apply q1800    Assessment: 62 yo male admitted for tikosyn initiation, on chronic Coumadin for afib.  INR within goal range today on home Coumadin dose of 5 mg daily except 7.5 mg on Sundays.    Goal of Therapy:  INR 2-3 Monitor platelets by anticoagulation protocol: Yes   Plan:  1. Coumadin 5 mg x 1 tonight. 2. Daily INR.  Danae OrleansMegan McCarthy, PharmD PGY1 Pharmacy Resident Phone [phone] 12/03/2017       9:41 AM

## 2017-12-03 NOTE — Plan of Care (Signed)

## 2017-12-03 NOTE — Progress Notes (Addendum)
Progress Note  Patient Name: Joseph Carson Date of Encounter: 12/03/2017  Primary Cardiologist: Dr. Ladona Ridgel   Subjective   No complaints, slept OK, feels OK  Inpatient Medications    Scheduled Meds: . atorvastatin  40 mg Oral Daily  . carvedilol  12.5 mg Oral BID WC  . dofetilide  500 mcg Oral BID  . furosemide  40 mg Oral Daily  . sodium chloride flush  3 mL Intravenous Q12H  . spironolactone  25 mg Oral Daily  . Warfarin - Pharmacist Dosing Inpatient   Does not apply q1800   Continuous Infusions: . sodium chloride    . magnesium sulfate 1 - 4 g bolus IVPB 1 g (12/03/17 0756)   PRN Meds: sodium chloride, acetaminophen, sodium chloride flush   Vital Signs    Vitals:   12/02/17 1713 12/02/17 2107 12/02/17 2107 12/03/17 0449  BP: 98/74 (!) 82/57 (!) 82/57 97/73  Pulse: 71 70 72 80  Resp:  19  18  Temp:  97.8 F (36.6 C) 97.8 F (36.6 C) 97.7 F (36.5 C)  TempSrc:  Oral Oral Oral  SpO2: 93% 97% 97% 95%  Weight: 208 lb 5.4 oz (94.5 kg)   206 lb 6.4 oz (93.6 kg)  Height: 5\' 10"  (1.778 m)       Intake/Output Summary (Last 24 hours) at 12/03/2017 0845 Last data filed at 12/03/2017 0452 Gross per 24 hour  Intake -  Output 325 ml  Net -325 ml   Filed Weights   12/02/17 1713 12/03/17 0449  Weight: 208 lb 5.4 oz (94.5 kg) 206 lb 6.4 oz (93.6 kg)    Telemetry    AFib, V paced/fusing - Personally Reviewed  ECG    AFib, V pacing, 71bpm,  - Personally Reviewed with Dr. Ladona Ridgel, QT manually measured at , corrects to 522, given paced QRS , and ICD in place, OK to continue load  Physical Exam   GEN: No acute distress.   Neck: No JVD Cardiac: RRR (paced), no murmurs, rubs, or gallops.  Respiratory: CTA b/l. GI: Soft, nontender, non-distended  MS: No edema; No deformity. Neuro:  Nonfocal  Psych: Normal affect   Labs    Chemistry Recent Labs  Lab 11/27/17 0735 12/02/17 1320 12/03/17 0422  NA 139 140 137  K 4.2 4.2 4.1  CL 104 100 102    CO2 24 28 29   GLUCOSE 104* 97 105*  BUN 17 21 26*  CREATININE 1.23 1.28* 1.28*  CALCIUM 9.7 9.8 9.0  PROT 7.2  --   --   ALBUMIN 3.7  --   --   AST 20  --   --   ALT 16  --   --   ALKPHOS 72  --   --   BILITOT 1.6*  --   --   GFRNONAA >60 58* 58*  GFRAA >60 >60 >60  ANIONGAP 11 12 6      Hematology Recent Labs  Lab 11/27/17 0735  WBC 9.1  RBC 5.21  HGB 15.0  HCT 46.5  MCV 89.3  MCH 28.8  MCHC 32.3  RDW 14.6  PLT 172    Cardiac EnzymesNo results for input(s): TROPONINI in the last 168 hours.  Recent Labs  Lab 11/27/17 0740 11/27/17 1311  TROPIPOC 0.02 0.01     BNP Recent Labs  Lab 11/27/17 0735  BNP 540.8*     DDimer No results for input(s): DDIMER in the last 168 hours.   Radiology    No  results found.  Cardiac Studies   12/02/17 TEE No LA/LAA or RA thrombus visualized. Depressed LV EF   06/16/14: TTE Study Conclusions - Left ventricle: The cavity size was severely dilated. The estimated ejection fraction was 20%. Diffuse hypokinesis. Doppler parameters are consistent with abnormal left ventricular relaxation (grade 1 diastolic dysfunction). - Aortic valve: There was no stenosis. - Mitral valve: There was trivial regurgitation. - Left atrium: The atrium was moderately dilated. - Right ventricle: The cavity size was normal. Systolic function was moderately reduced. - Tricuspid valve: Peak RV-RA gradient (S): 13 mm Hg. - Pulmonary arteries: PA peak pressure: 16 mm Hg (S). - Inferior vena cava: The vessel was normal in size. The respirophasic diameter changes were in the normal range (>= 50%), consistent with normal central venous pressure. Impressions - Severely dilated LV with EF 15%, diffuse hypokinesis. Normal RV size with moderate systolic dysfunction. No significant valvular abnormalities.    Patient Profile     62 y.o. male male with a history of CAD (CABG), ICM, LBBB w/CRT-D, chronic CHF (systolic), hx of remote  LV thrombus, and PAFib admitted for Tikosyn load, s/p TEE neg for thrombus.  Device information: MDT CRT-D, implanted4/14/16  Assessment & Plan    1. Persistent AFib     CHA2DS2Vasc is 2, on warfarin     K+ 4.1     Mag 1.8, replacement ordered     Creat 1.28 stable     INR 2.63     QTc is acceptable given prlonged QRS, and ICD in place  2. CAD     No anginal symptoms  3. ICM     Exam does not suggest fluid Ol     Continue home meds  4. MDT CRT-D     RV lead was programmed subthreshold given diaphragmatic stim     Dr. Ladona Ridgelaylor has discussed with the patient while here to plan for RV lead revision, new lead implant.  Discussed the procedure, risks and benefits.  The patient is agreeable to proceed, will plan for lead revision tomorrow and DCCV if not in SR    For questions or updates, please contact [facility] Please consult [email or web address] for contact info under Cardiology/STEMI.      Signed, Sheilah Pigeonenee Lynn Ursuy, PA-C  12/03/2017, 8:45 AM    EP Attending  Patient seen and examined. Agree with above. I plan to DCCV and add a new RV lead tomorrow. We discussed removal of his old ICD lead. We will discuss some more tomorrow. If his left subclavian vein is occluded this would be mandatory to be able to place a new lead.  Leonia ReevesGregg Loxley Cibrian,M.D.

## 2017-12-04 ENCOUNTER — Encounter (HOSPITAL_COMMUNITY)
Admission: RE | Disposition: A | Discharge status: Home / Self Care | Payer: Self-pay | Source: Ambulatory Visit | Attending: Internal Medicine

## 2017-12-04 ENCOUNTER — Inpatient Hospital Stay (HOSPITAL_COMMUNITY)
Admission: RE | Disposition: A | Discharge status: Home / Self Care | Payer: Self-pay | Source: Ambulatory Visit | Attending: Internal Medicine

## 2017-12-04 DIAGNOSIS — T82110A Breakdown (mechanical) of cardiac electrode, initial encounter: Secondary | ICD-10-CM

## 2017-12-04 DIAGNOSIS — T82198A Other mechanical complication of other cardiac electronic device, initial encounter: Secondary | ICD-10-CM

## 2017-12-04 HISTORY — PX: LEAD REVISION/REPAIR: EP1213

## 2017-12-04 HISTORY — PX: CARDIOVERSION: EP1203

## 2017-12-04 LAB — BASIC METABOLIC PANEL
ANION GAP: 8 (ref 5–15)
BUN: 25 mg/dL — ABNORMAL HIGH (ref 8–23)
CALCIUM: 9.1 mg/dL (ref 8.9–10.3)
CO2: 27 mmol/L (ref 22–32)
Chloride: 103 mmol/L (ref 98–111)
Creatinine, Ser: 1.13 mg/dL (ref 0.61–1.24)
GFR calc non Af Amer: >60 mL/min (ref >60)
Glucose, Bld: 99 mg/dL (ref 70–99)
Potassium: 4.1 mmol/L (ref 3.5–5.1)
Sodium: 138 mmol/L (ref 135–145)

## 2017-12-04 LAB — SURGICAL PCR SCREEN
MRSA, PCR: NEGATIVE
Staphylococcus aureus: NEGATIVE

## 2017-12-04 LAB — PROTIME-INR
INR: 3.01
PROTHROMBIN TIME: 31 s — AB (ref 11.4–15.2)

## 2017-12-04 LAB — MAGNESIUM: MAGNESIUM: 1.9 mg/dL (ref 1.7–2.4)

## 2017-12-04 SURGERY — CARDIOVERSION
Anesthesia: General

## 2017-12-04 SURGERY — LEAD REVISION/REPAIR

## 2017-12-04 MED ORDER — CHLORHEXIDINE GLUCONATE 4 % EX LIQD
60.0000 mL | Freq: Once | CUTANEOUS | Status: AC
  Administered 2017-12-04: 4 via TOPICAL
  Filled 2017-12-04: qty 60

## 2017-12-04 MED ORDER — SODIUM CHLORIDE 0.9 % IV SOLN
INTRAVENOUS | Status: AC
  Filled 2017-12-04: qty 2

## 2017-12-04 MED ORDER — LIDOCAINE HCL (PF) 1 % IJ SOLN
INTRAMUSCULAR | Status: DC | PRN
  Administered 2017-12-04: 60 mL

## 2017-12-04 MED ORDER — CEFAZOLIN SODIUM-DEXTROSE 2-4 GM/100ML-% IV SOLN
INTRAVENOUS | Status: AC
  Filled 2017-12-04: qty 100

## 2017-12-04 MED ORDER — FENTANYL CITRATE (PF) 100 MCG/2ML IJ SOLN
INTRAMUSCULAR | Status: AC
  Filled 2017-12-04: qty 2

## 2017-12-04 MED ORDER — MAGNESIUM SULFATE IN D5W 1-5 GM/100ML-% IV SOLN
1.0000 g | Freq: Once | INTRAVENOUS | Status: AC
  Administered 2017-12-04: 1 g via INTRAVENOUS
  Filled 2017-12-04: qty 100

## 2017-12-04 MED ORDER — CEFAZOLIN SODIUM-DEXTROSE 2-4 GM/100ML-% IV SOLN
2.0000 g | INTRAVENOUS | Status: AC
  Administered 2017-12-04: 2 g via INTRAVENOUS
  Filled 2017-12-04: qty 100

## 2017-12-04 MED ORDER — SODIUM CHLORIDE 0.9 % IV SOLN
INTRAVENOUS | Status: DC
  Administered 2017-12-04: 02:00:00 via INTRAVENOUS

## 2017-12-04 MED ORDER — IOPAMIDOL (ISOVUE-370) INJECTION 76%
INTRAVENOUS | Status: AC
  Filled 2017-12-04: qty 50

## 2017-12-04 MED ORDER — WARFARIN SODIUM 2.5 MG PO TABS
2.5000 mg | ORAL_TABLET | Freq: Once | ORAL | Status: AC
  Administered 2017-12-04: 2.5 mg via ORAL
  Filled 2017-12-04: qty 1

## 2017-12-04 MED ORDER — MIDAZOLAM HCL 5 MG/5ML IJ SOLN
INTRAMUSCULAR | Status: AC
  Filled 2017-12-04: qty 5

## 2017-12-04 MED ORDER — SODIUM CHLORIDE 0.9 % IV SOLN
80.0000 mg | INTRAVENOUS | Status: DC
  Filled 2017-12-04: qty 2

## 2017-12-04 SURGICAL SUPPLY — 2 items
CABLE SURGICAL S-101-97-12 (CABLE) ×3 IMPLANT
TRAY PACEMAKER INSERTION (PACKS) ×3 IMPLANT

## 2017-12-04 NOTE — Progress Notes (Signed)
Nutrition Brief Note  Patient identified on the Malnutrition Screening Tool (MST) Report. Patient with some weight loss, related to fluid shifts with hx of CHF.   Wt Readings from Last 15 Encounters:  12/04/17 206 lb 11.2 oz (93.8 kg)  12/02/17 206 lb (93.4 kg)  11/28/17 205 lb 6.4 oz (93.2 kg)  11/18/16 224 lb 6.4 oz (101.8 kg)  10/04/15 220 lb 9.6 oz (100.1 kg)  10/04/15 218 lb 1.9 oz (98.9 kg)  08/28/15 216 lb 11.2 oz (98.3 kg)  08/23/15 210 lb 4.8 oz (95.4 kg)  11/24/14 207 lb 12.8 oz (94.3 kg)  11/07/14 203 lb 8 oz (92.3 kg)  10/19/14 199 lb (90.3 kg)  09/02/14 192 lb 14.4 oz (87.5 kg)  07/28/14 191 lb 6.4 oz (86.8 kg)  07/05/14 190 lb (86.2 kg)  06/16/14 186 lb 8 oz (84.6 kg)    Body mass index is 29.66 kg/m. Patient meets criteria for overweight based on current BMI.   Nutrition focused physical exam completed.  No muscle or subcutaneous fat depletion noticed.  Current diet order is NPO for procedure; previously on heart healthy diet consuming 100% of meals. Labs and medications reviewed.   No nutrition interventions warranted at this time. If nutrition issues arise, please consult RD.   Joaquin CourtsKimberly Jefrey Raburn, RD, LDN, CNSC Pager [phone] After Hours Pager [phone]

## 2017-12-04 NOTE — H&P (View-Only) (Signed)
Progress Note  Patient Name: Joseph Carson Date of Encounter: 12/04/2017  Primary Cardiologist: Dr. Ladona Ridgel   Subjective   No complaints, slept OK again, feels OK  Inpatient Medications    Scheduled Meds: . atorvastatin  40 mg Oral Daily  . carvedilol  12.5 mg Oral BID WC  . chlorhexidine  60 mL Topical Once  . dofetilide  500 mcg Oral BID  . feeding supplement (ENSURE ENLIVE)  237 mL Oral BID BM  . furosemide  40 mg Oral Daily  . gentamicin irrigation  80 mg Irrigation On Call  . sodium chloride flush  3 mL Intravenous Q12H  . spironolactone  25 mg Oral Daily  . Warfarin - Pharmacist Dosing Inpatient   Does not apply q1800   Continuous Infusions: . sodium chloride    . sodium chloride 50 mL/hr at 12/04/17 0208  .  ceFAZolin (ANCEF) IV    . magnesium sulfate 1 - 4 g bolus IVPB 1 g (12/04/17 0814)   PRN Meds: sodium chloride, acetaminophen, sodium chloride flush   Vital Signs    Vitals:   12/03/17 0449 12/03/17 1442 12/03/17 2045 12/04/17 0611  BP: 97/73 95/63 93/78  (!) 88/70  Pulse: 80 71 86 (!) 116  Resp: 18  18 18   Temp: 97.7 F (36.5 C) (!) 97.5 F (36.4 C) 98.2 F (36.8 C) 98 F (36.7 C)  TempSrc: Oral Oral Oral Oral  SpO2: 95% 98% 95% 97%  Weight: 206 lb 6.4 oz (93.6 kg)   206 lb 11.2 oz (93.8 kg)  Height:        Intake/Output Summary (Last 24 hours) at 12/04/2017 0847 Last data filed at 12/04/2017 0600 Gross per 24 hour  Intake 1116.83 ml  Output 1700 ml  Net -583.17 ml   Filed Weights   12/02/17 1713 12/03/17 0449 12/04/17 0611  Weight: 208 lb 5.4 oz (94.5 kg) 206 lb 6.4 oz (93.6 kg) 206 lb 11.2 oz (93.8 kg)    Telemetry    AFib, V paced/fusing - Personally Reviewed  ECG    AFib, V pacing, 76bpm,  - Personally Reviewed with Dr. Ladona Ridgel, QT stable, OK to continue load  Physical Exam   Exam is unchanged today GEN: No acute distress.   Neck: No JVD Cardiac: RRR (paced), no murmurs, rubs, or gallops.  Respiratory: CTA b/l. GI:  Soft, nontender, non-distended  MS: No edema; No deformity. Neuro:  Nonfocal  Psych: Normal affect   Labs    Chemistry Recent Labs  Lab 12/02/17 1320 12/03/17 0422 12/04/17 0328  NA 140 137 138  K 4.2 4.1 4.1  CL 100 102 103  CO2 28 29 27   GLUCOSE 97 105* 99  BUN 21 26* 25*  CREATININE 1.28* 1.28* 1.13  CALCIUM 9.8 9.0 9.1  GFRNONAA 58* 58* >60  GFRAA >60 >60 >60  ANIONGAP 12 6 8      Hematology No results for input(s): WBC, RBC, HGB, HCT, MCV, MCH, MCHC, RDW, PLT in the last 168 hours.  Cardiac EnzymesNo results for input(s): TROPONINI in the last 168 hours.  Recent Labs  Lab 11/27/17 1311  TROPIPOC 0.01     BNP No results for input(s): BNP, PROBNP in the last 168 hours.   DDimer No results for input(s): DDIMER in the last 168 hours.   Radiology    No results found.  Cardiac Studies   12/02/17 TEE No LA/LAA or RA thrombus visualized. Depressed LV EF   06/16/14: TTE Study Conclusions - Left ventricle:  The cavity size was severely dilated. The estimated ejection fraction was 20%. Diffuse hypokinesis. Doppler parameters are consistent with abnormal left ventricular relaxation (grade 1 diastolic dysfunction). - Aortic valve: There was no stenosis. - Mitral valve: There was trivial regurgitation. - Left atrium: The atrium was moderately dilated. - Right ventricle: The cavity size was normal. Systolic function was moderately reduced. - Tricuspid valve: Peak RV-RA gradient (S): 13 mm Hg. - Pulmonary arteries: PA peak pressure: 16 mm Hg (S). - Inferior vena cava: The vessel was normal in size. The respirophasic diameter changes were in the normal range (>= 50%), consistent with normal central venous pressure. Impressions - Severely dilated LV with EF 15%, diffuse hypokinesis. Normal RV size with moderate systolic dysfunction. No significant valvular abnormalities.    Patient Profile     62 y.o. male male with a history of CAD  (CABG), ICM, LBBB w/CRT-D, chronic CHF (systolic), hx of remote LV thrombus, and PAFib admitted for Tikosyn load, s/p TEE neg for thrombus.  Device information: MDT CRT-D, implanted4/14/16  Assessment & Plan    1. Persistent AFib     CHA2DS2Vasc is 2, on warfarin     K+ 4.1     Mag 1.9, replacement ordered     Creat 1.13 stable     INR 3.01, pharmacy managing, try to keep INR below 3 (to remain therapeutic) post proceudre     QTc is acceptable given prolonged QRS, and ICD in place  2. CAD     No anginal symptoms  3. ICM     Exam does not suggest fluid Ol     Continue home meds  4. MDT CRT-D     RV lead was programmed subthreshold given diaphragmatic stim     Dr. Ladona Ridgelaylor has discussed with the patient while here to plan for RV lead revision, new lead implant.  Discussed the procedure, risks and benefits.  The patient is agreeable to proceed, will plan for lead revision tomorrow and DCCV if not in SR   Plan for new RV lead if vein looks OK, and DCCV today Anticipate discharge tomorrow   For questions or updates, please contact [facility] Please consult [email or web address] for contact info under Cardiology/STEMI.      Signed, Sheilah PigeonRenee Lynn Ursuy, PA-C  12/04/2017, 8:47 AM    EP Attending  Patient seen and examined. Agree with above. We will plan to proceed with attempted new RV lead insertion. He has reverted back to NSR on dofetilide. If vein is occluded he will require extraction of that lead and insertion of a new lead.  Leonia ReevesGregg Alfhild Partch,M.D.

## 2017-12-04 NOTE — Care Management Note (Signed)
Case Management Note  Patient Details  Name: Joseph Carson MRN: [id number] Date of Birth: [birth year]  Subjective/Objective:  Pt presented for Tikosyn Load: Persistent Atrial Fib. Benefits check for Tikosyn in process- CM will make patient aware of cost once completed.                  Action/Plan: CM will assist with the Rx for the 7 day supply no refills via Main Pharmacy and pt will need an additional Rx with refills. CM will monitor for additional disposition needs.      Expected Discharge Date:                  Expected Discharge Plan:  Home/Self Care  In-House Referral:  NA  Discharge planning Services  CM Consult, Medication Assistance  Post Acute Care Choice:  NA Choice offered to:  NA  DME Arranged:  N/A DME Agency:  NA  HH Arranged:  NA HH Agency:  NA  Status of Service:  Completed, signed off  If discussed at Long Length of Stay Meetings, dates discussed:    Additional Comments: 1631 12-04-17 Joseph BambergerBrenda Graves-Bigelow, RN,BSN [phone] Cost will be $3.40 for Dofetilide-retail cost 90 day supply $3.40. No further needs from CM at this time.  Gala LewandowskyGraves-Bigelow, Marializ Ferrebee Kaye, RN 12/04/2017, 4:29 PM

## 2017-12-04 NOTE — Progress Notes (Signed)
Post dose EKG is reviewed with Dr. Ladona Ridgelaylor, is in SR 66bpm, QT is ok to proceed with Tikosyn load given QRS duration and ICD.  Francis Dowseenee Ursuy, PA-C

## 2017-12-04 NOTE — Progress Notes (Addendum)
 Progress Note  Patient Name: Joseph Carson Date of Encounter: 12/04/2017  Primary Cardiologist: Dr. Taylor   Subjective   No complaints, slept OK again, feels OK  Inpatient Medications    Scheduled Meds: . atorvastatin  40 mg Oral Daily  . carvedilol  12.5 mg Oral BID WC  . chlorhexidine  60 mL Topical Once  . dofetilide  500 mcg Oral BID  . feeding supplement (ENSURE ENLIVE)  237 mL Oral BID BM  . furosemide  40 mg Oral Daily  . gentamicin irrigation  80 mg Irrigation On Call  . sodium chloride flush  3 mL Intravenous Q12H  . spironolactone  25 mg Oral Daily  . Warfarin - Pharmacist Dosing Inpatient   Does not apply q1800   Continuous Infusions: . sodium chloride    . sodium chloride 50 mL/hr at 12/04/17 0208  .  ceFAZolin (ANCEF) IV    . magnesium sulfate 1 - 4 g bolus IVPB 1 g (12/04/17 0814)   PRN Meds: sodium chloride, acetaminophen, sodium chloride flush   Vital Signs    Vitals:   12/03/17 0449 12/03/17 1442 12/03/17 2045 12/04/17 0611  BP: 97/73 95/63 93/78 (!) 88/70  Pulse: 80 71 86 (!) 116  Resp: 18  18 18  Temp: 97.7 F (36.5 C) (!) 97.5 F (36.4 C) 98.2 F (36.8 C) 98 F (36.7 C)  TempSrc: Oral Oral Oral Oral  SpO2: 95% 98% 95% 97%  Weight: 206 lb 6.4 oz (93.6 kg)   206 lb 11.2 oz (93.8 kg)  Height:        Intake/Output Summary (Last 24 hours) at 12/04/2017 0847 Last data filed at 12/04/2017 0600 Gross per 24 hour  Intake 1116.83 ml  Output 1700 ml  Net -583.17 ml   Filed Weights   12/02/17 1713 12/03/17 0449 12/04/17 0611  Weight: 208 lb 5.4 oz (94.5 kg) 206 lb 6.4 oz (93.6 kg) 206 lb 11.2 oz (93.8 kg)    Telemetry    AFib, V paced/fusing - Personally Reviewed  ECG    AFib, V pacing, 76bpm,  - Personally Reviewed with Dr. Taylor, QT stable, OK to continue load  Physical Exam   Exam is unchanged today GEN: No acute distress.   Neck: No JVD Cardiac: RRR (paced), no murmurs, rubs, or gallops.  Respiratory: CTA b/l. GI:  Soft, nontender, non-distended  MS: No edema; No deformity. Neuro:  Nonfocal  Psych: Normal affect   Labs    Chemistry Recent Labs  Lab 12/02/17 1320 12/03/17 0422 12/04/17 0328  NA 140 137 138  K 4.2 4.1 4.1  CL 100 102 103  CO2 28 29 27  GLUCOSE 97 105* 99  BUN 21 26* 25*  CREATININE 1.28* 1.28* 1.13  CALCIUM 9.8 9.0 9.1  GFRNONAA 58* 58* >60  GFRAA >60 >60 >60  ANIONGAP 12 6 8     Hematology No results for input(s): WBC, RBC, HGB, HCT, MCV, MCH, MCHC, RDW, PLT in the last 168 hours.  Cardiac EnzymesNo results for input(s): TROPONINI in the last 168 hours.  Recent Labs  Lab 11/27/17 1311  TROPIPOC 0.01     BNP No results for input(s): BNP, PROBNP in the last 168 hours.   DDimer No results for input(s): DDIMER in the last 168 hours.   Radiology    No results found.  Cardiac Studies   12/02/17 TEE No LA/LAA or RA thrombus visualized. Depressed LV EF   06/16/14: TTE Study Conclusions - Left ventricle:   The cavity size was severely dilated. The estimated ejection fraction was 20%. Diffuse hypokinesis. Doppler parameters are consistent with abnormal left ventricular relaxation (grade 1 diastolic dysfunction). - Aortic valve: There was no stenosis. - Mitral valve: There was trivial regurgitation. - Left atrium: The atrium was moderately dilated. - Right ventricle: The cavity size was normal. Systolic function was moderately reduced. - Tricuspid valve: Peak RV-RA gradient (S): 13 mm Hg. - Pulmonary arteries: PA peak pressure: 16 mm Hg (S). - Inferior vena cava: The vessel was normal in size. The respirophasic diameter changes were in the normal range (>= 50%), consistent with normal central venous pressure. Impressions - Severely dilated LV with EF 15%, diffuse hypokinesis. Normal RV size with moderate systolic dysfunction. No significant valvular abnormalities.    Patient Profile     62 y.o. male male with a history of CAD  (CABG), ICM, LBBB w/CRT-D, chronic CHF (systolic), hx of remote LV thrombus, and PAFib admitted for Tikosyn load, s/p TEE neg for thrombus.  Device information: MDT CRT-D, implanted4/14/16  Assessment & Plan    1. Persistent AFib     CHA2DS2Vasc is 2, on warfarin     K+ 4.1     Mag 1.9, replacement ordered     Creat 1.13 stable     INR 3.01, pharmacy managing, try to keep INR below 3 (to remain therapeutic) post proceudre     QTc is acceptable given prolonged QRS, and ICD in place  2. CAD     No anginal symptoms  3. ICM     Exam does not suggest fluid Ol     Continue home meds  4. MDT CRT-D     RV lead was programmed subthreshold given diaphragmatic stim     Dr. Taylor has discussed with the patient while here to plan for RV lead revision, new lead implant.  Discussed the procedure, risks and benefits.  The patient is agreeable to proceed, will plan for lead revision tomorrow and DCCV if not in SR   Plan for new RV lead if vein looks OK, and DCCV today Anticipate discharge tomorrow   For questions or updates, please contact [facility] Please consult [email or web address] for contact info under Cardiology/STEMI.      Signed, Renee Lynn Ursuy, PA-C  12/04/2017, 8:47 AM    EP Attending  Patient seen and examined. Agree with above. We will plan to proceed with attempted new RV lead insertion. He has reverted back to NSR on dofetilide. If vein is occluded he will require extraction of that lead and insertion of a new lead.  Gregg Taylor,M.D.  

## 2017-12-04 NOTE — Interval H&P Note (Signed)
History and Physical Interval Note:  12/04/2017 3:34 PM  Joseph Carson  has presented today for surgery, with the diagnosis of RV Lead dysfunction, afib  The various methods of treatment have been discussed with the patient and family. After consideration of risks, benefits and other options for treatment, the patient has consented to  Procedure(s): LEAD REVISION/REPAIR (N/A) CARDIOVERSION (N/A) as a surgical intervention .  The patient's history has been reviewed, patient examined, no change in status, stable for surgery.  I have reviewed the patient's chart and labs.  Questions were answered to the patient's satisfaction.     Gregg Taylor   

## 2017-12-04 NOTE — Interval H&P Note (Signed)
History and Physical Interval Note:  12/04/2017 3:34 PM  Joseph Carson  has presented today for surgery, with the diagnosis of RV Lead dysfunction, afib  The various methods of treatment have been discussed with the patient and family. After consideration of risks, benefits and other options for treatment, the patient has consented to  Procedure(s): LEAD REVISION/REPAIR (N/A) CARDIOVERSION (N/A) as a surgical intervention .  The patient's history has been reviewed, patient examined, no change in status, stable for surgery.  I have reviewed the patient's chart and labs.  Questions were answered to the patient's satisfaction.     Lewayne BuntingGregg Maycel Riffe

## 2017-12-04 NOTE — Progress Notes (Signed)
ANTICOAGULATION CONSULT NOTE - Follow up Consult  Pharmacy Consult for Warfarin Indication: atrial fibrillation and pulmonary embolus  No Known Allergies  Patient Measurements: Height: 5\' 10"  (177.8 cm) Weight: 206 lb 11.2 oz (93.8 kg) IBW/kg (Calculated) : 73 Heparin Dosing Weight: n/a  Vital Signs: Temp: 98 F (36.7 C) (07/18 0611) Temp Source: Oral (07/18 0611) BP: 88/70 (07/18 0611) Pulse Rate: 116 (07/18 0611)  Labs: Recent Labs    12/02/17 1146 12/02/17 1320 12/03/17 0422 12/04/17 0328  LABPROT  --   --  27.9* 31.0*  INR 2.6  --  2.63 3.01  CREATININE  --  1.28* 1.28* 1.13    Estimated Creatinine Clearance: 77.9 mL/min (by C-G formula based on SCr of 1.13 mg/dL).   Medical History: Past Medical History:  Diagnosis Date  . AICD (automatic cardioverter/defibrillator) present   . Atrial fibrillation, chronic (HCC)   . CAD (coronary artery disease)    a. s/p CABG x 3 03/2013 (L-LAD, S[place], [place]  . CAP (community acquired pneumonia) 08/22/2015  . CHF (congestive heart failure) (HCC)   . Chronic combined systolic and diastolic heart failure [facility](HCC) November 2014  . Coronary artery disease   . HLD (hyperlipidemia)    . Hyperbilirubinemia   . Ischemic cardiomyopathy    a. echo (10/15):  EF 20%, diff HK, apical AK, mobile density inf wall near papillary muscle (?ruptured) with mod MR, mod reduced RVSF, mild PI, PASP 37 mmHg b. s/p CRTD   . LBBB (left bundle branch block)    . LV (left ventricular) mural thrombus     a. TEE (10/15):  EF 15-20%, septum and ant walls AK, lat wall HK, no papillary muscle rupture, probable small thrombus on ant/ant-septal wall, mild MR, mod decreased RVSF, neg bubble study  . Mitral regurgitation     mild by TEE 02/2014  . NSVT (nonsustained ventricular tachycardia) (HCC)        . Obesity   . Pulmonary hypertension (HCC)   . Umbilical hernia    "have had it since age [age]"    Medications:  Scheduled:  . atorvastatin  40  mg Oral Daily  . carvedilol  12.5 mg Oral BID WC  . chlorhexidine  60 mL Topical Once  . dofetilide  500 mcg Oral BID  . feeding supplement (ENSURE ENLIVE)  237 mL Oral BID BM  . furosemide  40 mg Oral Daily  . gentamicin irrigation  80 mg Irrigation On Call  . sodium chloride flush  3 mL Intravenous Q12H  . spironolactone  25 mg Oral Daily  . Warfarin - Pharmacist Dosing Inpatient   Does not apply q1800    Assessment: 62 yo male admitted for tikosyn initiation, on chronic Coumadin for afib. Some discrepancies exist in Anticoagulation Clinic Note concerning Sunday dosing 2.5mg vs. 7.5mg. Upon review Coumadin dose of 5 mg daily except 2.5 mg on Sundays is the most consistent with anticoagulation note summary and dose instructions. Patient confirms this dose as well. INR slightly high today.   Goal of Therapy:  INR 2-3 Monitor platelets by anticoagulation protocol: Yes   Plan:  1. Coumadin 2.5 mg x 1 tonight/ Likely resume home dose tomorrow 2. Daily INR.  Shanae Luo, Pharm D PGY1 Pharmacy Resident  Phone [phone] 12/04/2017      11 :15 AM

## 2017-12-05 ENCOUNTER — Encounter (HOSPITAL_COMMUNITY): Payer: Self-pay | Admitting: Internal Medicine

## 2017-12-05 LAB — PROTIME-INR
INR: 3.06
Prothrombin Time: 31.4 seconds — ABNORMAL HIGH (ref 11.4–15.2)

## 2017-12-05 LAB — MAGNESIUM: Magnesium: 2 mg/dL (ref 1.7–2.4)

## 2017-12-05 LAB — BASIC METABOLIC PANEL
ANION GAP: 11 (ref 5–15)
BUN: 27 mg/dL — ABNORMAL HIGH (ref 8–23)
CALCIUM: 9 mg/dL (ref 8.9–10.3)
CO2: 26 mmol/L (ref 22–32)
Chloride: 100 mmol/L (ref 98–111)
Creatinine, Ser: 1.07 mg/dL (ref 0.61–1.24)
GLUCOSE: 91 mg/dL (ref 70–99)
POTASSIUM: 4 mmol/L (ref 3.5–5.1)
Sodium: 137 mmol/L (ref 135–145)

## 2017-12-05 MED ORDER — DOFETILIDE 250 MCG PO CAPS
250.0000 ug | ORAL_CAPSULE | Freq: Two times a day (BID) | ORAL | Status: DC
  Filled 2017-12-05: qty 14

## 2017-12-05 MED ORDER — DOFETILIDE 250 MCG PO CAPS: 250.0000 ug | ORAL_CAPSULE | Freq: Two times a day (BID) | ORAL | 6 refills | Status: DC

## 2017-12-05 MED ORDER — WARFARIN SODIUM 2.5 MG PO TABS: 2.5000 mg | ORAL_TABLET | Freq: Once | ORAL | Status: DC

## 2017-12-05 MED FILL — Gentamicin in Saline Inj 0.8 MG/ML: INTRAVENOUS | Qty: 100 | Status: AC

## 2017-12-05 NOTE — Discharge Instructions (Signed)
You can remove the bandage from L chest after 48 hours   You have an appointment set up with the Atrial Fibrillation Clinic.  Multiple studies have shown that being followed by a dedicated atrial fibrillation clinic in addition to the standard care you receive from your other physicians improves health. We believe that enrollment in the atrial fibrillation clinic will allow us to better care for you.   The phone number to the Atrial Fibrillation Clinic is [phone]. The clinic is staffed Monday through Friday from 8:30am to 5pm.  Parking Directions: The clinic is located in the Heart and Vascular Building [facility]. 1)From [address] turn on to [address] and go to the 3rd entrance  (Heart and Vascular entrance) on the right. 2)Look to the right for Heart &Vascular Parking Garage. 3)A code for the entrance is required please call the clinic to receive this.   4)Take the elevators to the 1st floor. Registration is in the room with the glass walls at the end of the hallway.  If you have any trouble parking or locating the clinic, please dont hesitate to call [phone].

## 2017-12-05 NOTE — Discharge Summary (Addendum)
ELECTROPHYSIOLOGY PROCEDURE DISCHARGE SUMMARY    Patient ID: Joseph Carson,  MRN: [id number], DOB/AGE: 1955-11-17 62 y.o.  Admit date: 12/02/2017 Discharge date: 12/05/2017  Primary Care Physician: Patient, No Pcp Per  Primary Cardiologist/Electrophysiologist: Dr. Ladona Ridgel  Primary Discharge Diagnosis:  1.  Persistent atrial fibrillation status post Tikosyn loading this admission      CHA2DS2Vasc is 2, on warfarin 2.  ICD hx of RV lead diaphragmatic stim       Secondary Discharge Diagnosis:  1. Chronic CHF     (currently compensated) 2. CAD   No Known Allergies   Procedures This Admission:  1. 12/02/17: TEE 2.  Tikosyn loading    Brief HPI: Joseph Carson is a 62 y.o. male with a past medical history as noted above is followed by EP, Dr. Ladona Ridgel in the outpatient setting.  He has had decline in overall exertional capacity, increased fatigue and worsening symptoms since his AF became persistent a few months ago.  Risks, benefits, and alternatives to Tikosyn were reviewed with the patient who wished to proceed.    Hospital Course:  The patient was brought in for TEE given coumadin therapy and not having had 3 consecutive weeks of INR's, his TEE r/o thrombus and he was admitted and Tikosyn was initiated.  He was seen initially in the AFib clinic for Tikosyn teaching/arrangements for admission.  His device was Leretha Pol, NP noted on device interrogation He has RV diaphragmatic stim and has been programmed subthreshold (this resulted in initial QRS morphology). RV diaphragmatic stim is not present at less than 1V@0 . . Threshold 0.5V @0 . . Programmed RV lead to 0.5V@1msec  today with no diaphragmatic stim noted. Can consider reprogramming to subthreshold once back in SR but because of sensing issues when programmed this way, he was binning in the VF zone on presenting rhythm to the clinic.  It was decided that while he was here, new RV lead would be  placed, though unable to pass a wire 2/2 likely occluded subclavian vein and the procedure abandoned.  RV lead extraction with new lead implantation will be considered at a later date not to interrupt his a/c now in SR with Tikosyn conversion.  He has not had any diaphragmatic stimulation while here, either supine, or ambulatory.    Renal function and electrolytes were followed during the hospitalization and remained stable.  His QT was felt to be acceptable for initiation given paced/BBB QRS duration and his ICD.  QT did lengthen more noted after the 6th dose. He were monitored until discharge on telemetry which demonstrated AFib w/occ PVC >> SR/V pacing/fusing, noting some NSVT episodes, 3-6beats, reviewed with Dr. Ladona Ridgel.  Given his heart failure symptoms are worse with his AFib, and risk of decompensation, and the fact that he has an ICD, Dr. Ladona Ridgel felt the potential benefit of maintaining SR for this patient and given the fact he has an ICD in place continuing Tikosyn was the best course.  Will continue his Joice Lofts though at dosing with full expectation that his QTc will shorten up, and cleared be discharged at the lower dose.  Dr. Ladona Ridgel discussed this with the patient.  He converted with drug and did not required DCCV.   On the day of discharge, the patient is feeling well, he was examined by Dr Ladona Ridgel who considered him stable for discharge to home.  Follow-up has been arranged with the afib clinic in 1 week and with Dr Ladona Ridgel in 4 weeks.   Physical Exam:  Vitals:   12/04/17 1737 12/04/17 1958 12/05/17 0524 12/05/17 1142  BP: 113/80 109/74 102/71 107/80  Pulse:  73 67   Resp:  20 20   Temp:  97.8 F (36.6 C) 98.3 F (36.8 C)   TempSrc:  Oral    SpO2:  96% 96%   Weight:   205 lb (93 kg)   Height:        GEN- The patient is well appearing, alert and oriented x 3 today.   HEENT: normocephalic, atraumatic; sclera clear, conjunctiva pink; hearing intact; oropharynx clear; neck supple,  no JVP Lymph- no cervical lymphadenopathy Lungs- CTA b/l, normal work of breathing.  No wheezes, rales, rhonchi              L chest, dry dressing, minimal ecchymosis, no hematoma Heart- RRR, no murmurs, rubs or gallops, PMI not laterally displaced GI- soft, non-tender, non-distended Extremities- no clubbing, cyanosis, or edema MS- no significant deformity or atrophy Skin- warm and dry, no rash or lesion Psych- euthymic mood, full affect Neuro- strength and sensation are intact   Labs:   Lab Results  Component Value Date   WBC 9.1 11/27/2017   HGB 15.0 11/27/2017   HCT 46.5 11/27/2017   MCV 89.3 11/27/2017   PLT 172 11/27/2017    Recent Labs  Lab 12/05/17 0501  NA 137  K 4.0  CL 100  CO2 26  BUN 27*  CREATININE 1.07  CALCIUM 9.0  GLUCOSE 91     Discharge Medications:  Allergies as of 12/05/2017   No Known Allergies     Medication List    TAKE these medications   acetaminophen 325 MG tablet Commonly known as:  TYLENOL Take 1-2 tablets (325-650 mg total) by mouth every 4 (four) hours as needed for mild pain.   atorvastatin 40 MG tablet Commonly known as:  LIPITOR Take 1 tablet (40 mg total) by mouth daily.   carvedilol 6.25 MG tablet Commonly known as:  COREG Take 2 tablets (12.5 mg total) by mouth 2 (two) times daily with a meal.   dofetilide 250 MCG capsule Commonly known as:  TIKOSYN Take 1 capsule (250 mcg total) by mouth 2 (two) times daily.   furosemide 40 MG tablet Commonly known as:  LASIX Take 1 tablet (40 mg total) by mouth daily.   HAIR/SKIN/NAILS PO Take 1 tablet by mouth daily.   spironolactone 25 MG tablet Commonly known as:  ALDACTONE Take 1 tablet (25 mg total) by mouth daily.   warfarin 5 MG tablet Commonly known as:  COUMADIN Take as directed. If you are unsure how to take this medication, talk to your nurse or doctor. Original instructions:  TAKE 1 TABLET DAILY, EXCEPT ON SUNDAYS TAKE 1 AND 1/2 TABLETS. (OR AS DIRECTED BY  COUMADIN CLINIC)       Disposition: Home Discharge Instructions    Diet - low sodium heart healthy   Complete by:  As directed    Increase activity slowly   Complete by:  As directed      Follow-up Information    [facility] Follow up on 12/12/2017.   Specialty:  Cardiology Why:  9:30AM Contact information: [address] [id number] [place]he[place] Bon[place] [address] [phone]       [facility] [address] [facility] Follow up on 12/11/2017.   Specialty:  Cardiology Why:  8:30AM, coumadin clinic Contact information: [address], [address] [place] [address] [phone]  Marinus Mawaylor, Gregg W, MD Follow up on 01/16/2018.   Specialty:  Cardiology Why:  8:30AM Contact information: [address] [place] [address] [phone]           Duration of Discharge Encounter: Greater than 30 minutes including physician time.  Norma FredricksonSigned, Renee Ursuy, PA-C 12/05/2017 12:24 PM   EP Attending  Patient seen and examined. Agree with above. The patient is stable for DC. His QTC is prolonged and I have recommended he reduce his dose of dofetilide to 250 q12. Usual followup.   Leonia ReevesGregg Taylor,M.D.

## 2017-12-05 NOTE — Progress Notes (Signed)
ANTICOAGULATION CONSULT NOTE - Follow up Consult  Pharmacy Consult for Warfarin Indication: atrial fibrillation and pulmonary embolus  No Known Allergies  Patient Measurements: Height: 5\' 10"  (177.8 cm) Weight: 205 lb (93 kg) IBW/kg (Calculated) : 73 Heparin Dosing Weight: n/a  Vital Signs: Temp: 98.3 F (36.8 C) (07/19 0524) BP: 102/71 (07/19 0524) Pulse Rate: 67 (07/19 0524)  Labs: Recent Labs    12/03/17 0422 12/04/17 0328 12/05/17 0501 12/05/17 0811  LABPROT 27.9* 31.0*  --  31.4*  INR 2.63 3.01  --  3.06  CREATININE 1.28* 1.13 1.07  --     Estimated Creatinine Clearance: 82 mL/min (by C-G formula based on SCr of 1.07 mg/dL).   Medical History: Past Medical History:  Diagnosis Date  . AICD (automatic cardioverter/defibrillator) present   . Atrial fibrillation, chronic (HCC)   . CAD (coronary artery disease)    a. s/p CABG x 3 03/2013 (L-LAD, S-RI, S-PDA) [place]  . CAP (community acquired pneumonia) 08/22/2015  . CHF (congestive heart failure) (HCC)   . Chronic combined systolic and diastolic heart failure [facility](HCC) November 2014  . Coronary artery disease   . HLD (hyperlipidemia)    . Hyperbilirubinemia   . Ischemic cardiomyopathy    a. echo (10/15):  EF 20%, diff HK, apical AK, mobile density inf wall near papillary muscle (?ruptured) with mod MR, mod reduced RVSF, mild PI, PASP 37 mmHg b. s/p CRTD   . LBBB (left bundle branch block)    . LV (left ventricular) mural thrombus     a. TEE (10/15):  EF 15-20%, septum and ant walls AK, lat wall HK, no papillary muscle rupture, probable small thrombus on ant/ant-septal wall, mild MR, mod decreased RVSF, neg bubble study  . Mitral regurgitation     mild by TEE 02/2014  . NSVT (nonsustained ventricular tachycardia) (HCC)        . Obesity   . Pulmonary hypertension (HCC)   . Umbilical hernia    "have had it since age [age]"    Medications:  Scheduled:  . atorvastatin  40 mg Oral Daily  . carvedilol  12.5 mg  Oral BID WC  . dofetilide  500 mcg Oral BID  . feeding supplement (ENSURE ENLIVE)  237 mL Oral BID BM  . furosemide  40 mg Oral Daily  . sodium chloride flush  3 mL Intravenous Q12H  . spironolactone  25 mg Oral Daily  . Warfarin - Pharmacist Dosing Inpatient   Does not apply q1800    Assessment: 62 yo male admitted for tikosyn initiation, on chronic Coumadin for afib. Some discrepancies exist in Anticoagulation Clinic Note concerning Sunday dosing 2.5mg  vs. 7.5mg . Upon review, Coumadin dose of 5 mg daily except 2.5 mg on Sundays is the most consistent with anticoagulation note summary and dose instructions. Patient confirms this dose as well. INR remains slightly high today at 3.06 - stable. No recent CBC. No bleed documented.  Goal of Therapy:  INR 2-3 Monitor platelets by anticoagulation protocol: Yes   Plan:  Coumadin 2.5 mg PO x1 dose again tonight Monitor daily INR, CBC, s/sx bleeding  Babs BertinHaley Chevette Fee, PharmD, BCPS Clinical Pharmacist Clinical phone [phone] Please check AMION for all [facility] contact numbers 12/05/2017 11:31 AM

## 2017-12-05 NOTE — Progress Notes (Signed)
Pt had 3 beats run and 6 beats run of V tach around 1000. Both times, he was asymptomatic.  Denied any discomfort. EKG will be done in a few minutes.  Hinton DyerYoko Dalten Ambrosino, RN

## 2017-12-05 NOTE — Progress Notes (Signed)
Discharge instruction was given to pt.  Pt received 7 days supply of Tikosyn.   Hinton DyerYoko Cullen Lahaie, RN

## 2017-12-10 NOTE — Progress Notes (Addendum)
Patient ID: Joseph Carson, male   DOB: Sep 14, 1955, 62 y.o.   MRN: [id number] PCP: Dr. Orpah Cobb  62 yo with history of CAD s/p CABG and ischemic cardiomyopathy. In 11/14, he had CABG in [place].  Echo at that time showed EF "19%."  He then left [place] and moved to [place] to live with his daughter and did not have any further medical contact until late 2015.  He was not taking any medications.  He gained about 50 lbs over this period.  He became severely short of breath gradually along with significant orthopnea.  No chest pain.  He finally came to the ER and was admitted with acute on chronic systolic CHF and massive volume overload.  EF was 20% by echo with small LV thrombus seen on TEE.  RV was mildly dilated with moderately decreased systolic function.  Diuresed with IV Lasix and started back on cardiac meds.  He lost almost 50 lbs in the hospital.    Admitted 7/16-7/19/19 for tikosyn loading for Afib. TEE showed EF 20-25%. He converted with Tikosyn. Attempted RV lead replacement, but unable to pass through occluded subclavian vein. Dr Ladona Ridgel will consider RV lead extraction with new lead implantation at a later date. Volume was thought to be stable. DC weight 205 lbs.   Last seen in HF clinic 10/2014. He presents today to reestablish care. Overall doing well. He feels much strong with less SOB since converting to NSR with tikosyn. He can do hills and stairs with no SOB. Still gets a little fatigued by the end of the day. Denies orthopnea, PND, or edema. No CP or dizziness. Taking all medications. Does not add salt. Limits fluid to less than 2 L. Weights stable at home ~200 lbs.   ICD: report did not come through.   Echo 06/16/2014:  EF remains low at 20% with severe LV dilation.   TEE 11/2017: EF 20-25%, no LV thrombus  Labs (10/15): K 5.1, creatinine 1.03, BNP 962 Labs (04/12/14): K 4.5 Creatinine 1.01  Labs (12/15): K 4.1, creatinine 1.05 Labs (08/25/14): K 3.9, Creatinine 1.27 Labs (10/18/13): K 4.2,  Creatinine 1.17  PMH: 1. CAD: CABG x 3 11/14 in [place], LIMA-LAD/SVG-ramus/SVG-PDA.   2. Ischemic cardiomyopathy: 11/14 echo [facility]) with EF 19%.  Echo (10/15) with EF 20%, severe diffuse hypokinesis with akinesis of apex, mild MR, mildly dilated RV with moderately decreased systolic function.  TEE (10/15) with EF 15-20%, septal and anterior akinesis, basal anterior/basal anteroseptal small thrombus attached to an akinetic segment, mild MR.   - Echo (1/16) with EF 20%, severe LV dilation, normal RV size with moderately decreased RV systolic function, no LV thrombus noted. - [employer]onic CRT-D 4/16.  3. LV thrombus: Diagnosed by TEE 10/15. Not seen on 1/16 echo.  4. NSVT  SH: Smoker (back up to 1/2 ppd from 1/4 ppd last visit), lives with daughter in [place], moved from [place] in 2015.    FH: CAD  Review of systems complete and found to be negative unless listed in HPI.   Current Outpatient Medications  Medication Sig Dispense Refill  . acetaminophen (TYLENOL) 325 MG tablet Take 1-2 tablets (325-650 mg total) by mouth every 4 (four) hours as needed for mild pain.    Marland Kitchen atorvastatin (LIPITOR) 40 MG tablet Take 1 tablet (40 mg total) by mouth daily. 90 tablet 3  . carvedilol (COREG) 6.25 MG tablet Take 2 tablets (12.5 mg total) by mouth 2 (two) times daily with a meal. 360 tablet 3  .  dofetilide (TIKOSYN) 250 MCG capsule Take 1 capsule (250 mcg total) by mouth 2 (two) times daily. 60 capsule 6  . furosemide (LASIX) 40 MG tablet Take 1 tablet (40 mg total) by mouth daily. 30 tablet 11  . Multiple Vitamins-Minerals (HAIR/SKIN/NAILS PO) Take 1 tablet by mouth daily.    Marland Kitchen spironolactone (ALDACTONE) 25 MG tablet Take 1 tablet (25 mg total) by mouth daily. 90 tablet 3  . warfarin (COUMADIN) 5 MG tablet TAKE 1 TABLET DAILY, EXCEPT ON SUNDAYS TAKE 1 AND 1/2 TABLETS. (OR AS DIRECTED BY COUMADIN CLINIC) 40 tablet 3   No current facility-administered medications for this encounter.     Vitals:    12/11/17 0915  BP: 108/74  Pulse: 64  SpO2: 94%  Weight: 208 lb (94.3 kg)   Wt Readings from Last 3 Encounters:  12/11/17 208 lb (94.3 kg)  12/05/17 205 lb (93 kg)  12/02/17 206 lb (93.4 kg)   General: Well appearing. No resp difficulty. HEENT: Normal Neck: Supple. JVP 5-6. Carotids 2+ bilat; no bruits. No thyromegaly or nodule noted. Cor: PMI nondisplaced. RRR, No M/G/R noted Lungs: CTAB, normal effort. Abdomen: Soft, non-tender, non-distended, no HSM. No bruits or masses. +BS  Extremities: No cyanosis, clubbing, or rash. R and LLE no edema.  Neuro: Alert & orientedx3, cranial nerves grossly intact. moves all 4 extremities w/o difficulty. Affect pleasant  EKG: NSR 63 bpm. Vpacing, QRS 180 ms. QTc 603 ms. Personally reviewed.   With pacing inactivated: NSR 59 bpm with LBBB (QRS 192 ms), QTc 584. Personally reviewed.   Assessment/Plan: 1. Chronic systolic CHF: Ischemic cardiomyopathy. Echo 05/2014 with EF 20%, severe LV dilation, and moderate RV systolic dysfunction. Now s/p Med[employer]onic CRT-D.  - TEE 11/2017 EF 20-25% -  NYHA class I-II symptoms and volume status stable.  - Continue Lasix 40 mg daily. Creatinine stable last week 1.07, K 4.0 - Continue 25 mg spironolactone daily.   - Continue lisinopril 5 mg bid. Not enough BP room to switch to [facility].  - Continue Coreg to 12.5 mg bid.  HR in 60s so will not increase today.  - RV lead no longer functioning properly. Follows with Dr Ladona Ridgel. Attempted to place a new RV lead, but unable to pass wire through subclavian vein. He may consider RV lead extraction and new lead implant at a later date. Sees Dr Ladona Ridgel next month. - Discussed with Apolinar Junes, rep with [employer]. Pt had settings adjusted last week. Unable to optimize further until RV replaced.  2. CAD: S/P CABG 11/14.   - Continue statin, coumadin, and beta blocker - No s/s ischemia.  3. Hyperlipidemia:  - Continue statin 4. Hx of LV thrombus: No evidence of thrombus on TEE  11/2017 - Continue coumadin. Denies bleeding. Follows in coumadin clinic.  6. Smoking:  - Encouraged cessation. No change.  7. PAF - Underwent TEE and started on Tikosyn during recent hospitalization - Continue coumadin and tikosyn 250 BID. Discussed importance of taking q12 hours. He wants to switch from 8a and 8p to 6a and 6p. Discussed with Rudi Coco, NP and he can gradually change doses by 30 minute increments.  - Follows up with Dr Ladona Ridgel in 1 month.  - Check BMET and mag today  - QTc is 603 on EKG. QTc without pacing 584. Discussed with Rudi Coco, NP who plans to discuss with Gypsy Balsam, EP NP. They will call patient if any changes need to be made to tikosyn dosing.   Stable from HF perspective. Follow  up in 3 months. BMET and mag today with recent initiation of tikosyn.  EP to call if pt needs to change Tikosyn dose.   Addendum: Per Dr Elberta Fortisamnitz, QTC is 510 ms. Okay to continue current tikosyn dose for now and keep follow up with Dr Ladona Ridgelaylor in 1 month. Will let patient know  Alford Highlandshley M Sarinity Dicicco 12/11/2017   Greater than 50% of the 25 minute visit was spent in counseling/coordination of care regarding disease state education, salt/fluid restriction, sliding scale diuretics, and medication compliance.

## 2017-12-11 ENCOUNTER — Ambulatory Visit (INDEPENDENT_AMBULATORY_CARE_PROVIDER_SITE_OTHER): Payer: Medicare Other | Admitting: Pharmacist

## 2017-12-11 ENCOUNTER — Ambulatory Visit (HOSPITAL_COMMUNITY)
Admission: RE | Admit: 2017-12-11 | Discharge: 2017-12-11 | Disposition: A | Discharge status: Home / Self Care | Payer: Medicare Other | Source: Ambulatory Visit | Attending: Cardiology | Admitting: Cardiology

## 2017-12-11 ENCOUNTER — Encounter (HOSPITAL_COMMUNITY): Payer: Self-pay

## 2017-12-11 VITALS — BP 108/74 | HR 64 | Wt 208.0 lb

## 2017-12-11 DIAGNOSIS — I480 Paroxysmal atrial fibrillation: Secondary | ICD-10-CM | POA: Insufficient documentation

## 2017-12-11 DIAGNOSIS — E785 Hyperlipidemia, unspecified: Secondary | ICD-10-CM | POA: Insufficient documentation

## 2017-12-11 DIAGNOSIS — Z79899 Other long term (current) drug therapy: Secondary | ICD-10-CM | POA: Insufficient documentation

## 2017-12-11 DIAGNOSIS — Z7901 Long term (current) use of anticoagulants: Secondary | ICD-10-CM | POA: Insufficient documentation

## 2017-12-11 DIAGNOSIS — I2510 Atherosclerotic heart disease of native coronary artery without angina pectoris: Secondary | ICD-10-CM | POA: Diagnosis not present

## 2017-12-11 DIAGNOSIS — I481 Persistent atrial fibrillation: Secondary | ICD-10-CM | POA: Diagnosis not present

## 2017-12-11 DIAGNOSIS — I4819 Other persistent atrial fibrillation: Secondary | ICD-10-CM

## 2017-12-11 DIAGNOSIS — Z8249 Family history of ischemic heart disease and other diseases of the circulatory system: Secondary | ICD-10-CM | POA: Diagnosis not present

## 2017-12-11 DIAGNOSIS — F17210 Nicotine dependence, cigarettes, uncomplicated: Secondary | ICD-10-CM | POA: Insufficient documentation

## 2017-12-11 DIAGNOSIS — Z5181 Encounter for therapeutic drug level monitoring: Secondary | ICD-10-CM | POA: Diagnosis not present

## 2017-12-11 DIAGNOSIS — I255 Ischemic cardiomyopathy: Secondary | ICD-10-CM | POA: Diagnosis not present

## 2017-12-11 DIAGNOSIS — I236 Thrombosis of atrium, auricular appendage, and ventricle as current complications following acute myocardial infarction: Secondary | ICD-10-CM

## 2017-12-11 DIAGNOSIS — Z951 Presence of aortocoronary bypass graft: Secondary | ICD-10-CM | POA: Diagnosis not present

## 2017-12-11 DIAGNOSIS — I5022 Chronic systolic (congestive) heart failure: Secondary | ICD-10-CM | POA: Diagnosis not present

## 2017-12-11 LAB — BASIC METABOLIC PANEL
ANION GAP: 10 (ref 5–15)
BUN: 20 mg/dL (ref 8–23)
CHLORIDE: 103 mmol/L (ref 98–111)
CO2: 24 mmol/L (ref 22–32)
Calcium: 9.7 mg/dL (ref 8.9–10.3)
Creatinine, Ser: 1.06 mg/dL (ref 0.61–1.24)
GFR calc non Af Amer: >60 mL/min (ref >60)
Glucose, Bld: 107 mg/dL — ABNORMAL HIGH (ref 70–99)
Potassium: 4.2 mmol/L (ref 3.5–5.1)
Sodium: 137 mmol/L (ref 135–145)

## 2017-12-11 LAB — MAGNESIUM: MAGNESIUM: 2 mg/dL (ref 1.7–2.4)

## 2017-12-11 LAB — POCT INR: INR: 2.1 (ref 2.0–3.0)

## 2017-12-11 NOTE — Addendum Note (Signed)
Encounter addended by: Alford HighlandSmith, Persis Graffius M, NP on: 12/11/2017 3:30 PM  Actions taken: Sign clinical note

## 2017-12-11 NOTE — Patient Instructions (Signed)
Description   Continue taking 1 tablet daily except 1/2 tablet on Sundays. Recheck INR in 3 weeks.  Call Coumadin Clinic if placed on any new medications or scheduled for any procedures - [phone].

## 2017-12-11 NOTE — Patient Instructions (Signed)
Routine lab work today. Will notify you of abnormal results, otherwise no news is good news!  Follow up 3 months.  _________________________________________________________________________ Garage Code: 1700  Take all medication as prescribed the day of your appointment. Bring all medications with you to your appointment.  Do the following things EVERYDAY: 1) Weigh yourself in the morning before breakfast. Write it down and keep it in a log. 2) Take your medicines as prescribed 3) Eat low salt foods-Limit salt (sodium) to 2000 mg per day.  4) Stay as active as you can everyday 5) Limit all fluids for the day to less than 2 liters  

## 2017-12-12 ENCOUNTER — Ambulatory Visit (HOSPITAL_COMMUNITY): Payer: Medicare Other | Admitting: Nurse Practitioner

## 2017-12-16 ENCOUNTER — Encounter: Payer: Medicare Other | Admitting: Internal Medicine

## 2017-12-25 ENCOUNTER — Other Ambulatory Visit: Payer: Self-pay | Admitting: Internal Medicine

## 2017-12-25 LAB — CUP PACEART REMOTE DEVICE CHECK
Battery Voltage: 2.93 V
Brady Statistic AP VP Percent: 5.72 %
Brady Statistic AS VP Percent: 70.93 %
Brady Statistic RA Percent Paced: 4.12 %
Brady Statistic RV Percent Paced: 68.83 %
Date Time Interrogation Session: 20190710134501
HighPow Impedance: 69 Ohm
Implantable Lead Implant Date: 20160414
Implantable Lead Location: 753859
Implantable Lead Location: 753860
Implantable Lead Model: 4398
Implantable Lead Model: 5076
Implantable Pulse Generator Implant Date: 20160414
Lead Channel Impedance Value: 342 Ohm
Lead Channel Impedance Value: 342 Ohm
Lead Channel Impedance Value: 399 Ohm
Lead Channel Impedance Value: 399 Ohm
Lead Channel Impedance Value: 475 Ohm
Lead Channel Impedance Value: 646 Ohm
Lead Channel Impedance Value: 665 Ohm
Lead Channel Impedance Value: 703 Ohm
Lead Channel Pacing Threshold Amplitude: 0.625 V
Lead Channel Pacing Threshold Pulse Width: 0.4 ms
Lead Channel Sensing Intrinsic Amplitude: 0.375 mV
Lead Channel Sensing Intrinsic Amplitude: 0.375 mV
Lead Channel Setting Pacing Amplitude: 0.5 V
Lead Channel Setting Pacing Amplitude: 1.5 V
Lead Channel Setting Pacing Pulse Width: 0.03 ms
Lead Channel Setting Sensing Sensitivity: 0.3 mV
MDC IDC LEAD IMPLANT DT: 20160414
MDC IDC LEAD IMPLANT DT: 20160414
MDC IDC LEAD LOCATION: 753858
MDC IDC MSMT BATTERY REMAINING LONGEVITY: 23 mo
MDC IDC MSMT LEADCHNL LV IMPEDANCE VALUE: 418 Ohm
MDC IDC MSMT LEADCHNL LV IMPEDANCE VALUE: 456 Ohm
MDC IDC MSMT LEADCHNL LV IMPEDANCE VALUE: 665 Ohm
MDC IDC MSMT LEADCHNL LV IMPEDANCE VALUE: 722 Ohm
MDC IDC MSMT LEADCHNL RA IMPEDANCE VALUE: 456 Ohm
MDC IDC MSMT LEADCHNL RV SENSING INTR AMPL: 12.125 mV
MDC IDC MSMT LEADCHNL RV SENSING INTR AMPL: 12.125 mV
MDC IDC SET LEADCHNL LV PACING AMPLITUDE: 2.5 V
MDC IDC SET LEADCHNL LV PACING PULSEWIDTH: 0.8 ms
MDC IDC STAT BRADY AP VS PERCENT: 0.13 %
MDC IDC STAT BRADY AS VS PERCENT: 23.22 %

## 2017-12-26 ENCOUNTER — Encounter: Payer: Self-pay | Admitting: Internal Medicine

## 2018-01-02 ENCOUNTER — Ambulatory Visit (INDEPENDENT_AMBULATORY_CARE_PROVIDER_SITE_OTHER): Payer: Medicare Other | Admitting: *Deleted

## 2018-01-02 DIAGNOSIS — Z5181 Encounter for therapeutic drug level monitoring: Secondary | ICD-10-CM | POA: Diagnosis not present

## 2018-01-02 LAB — POCT INR: INR: 2.6 (ref 2.0–3.0)

## 2018-01-02 NOTE — Patient Instructions (Signed)
Continue taking 1 tablet daily except 1/2 tablet on Sundays. Recheck INR in 4 weeks.  Call Coumadin Clinic if placed on any new medications or scheduled for any procedures - [phone].

## 2018-01-15 ENCOUNTER — Other Ambulatory Visit: Payer: Self-pay | Admitting: Internal Medicine

## 2018-01-16 ENCOUNTER — Encounter: Payer: Self-pay | Admitting: Internal Medicine

## 2018-01-16 ENCOUNTER — Ambulatory Visit (INDEPENDENT_AMBULATORY_CARE_PROVIDER_SITE_OTHER): Payer: Medicare Other | Admitting: Internal Medicine

## 2018-01-16 VITALS — BP 122/64 | HR 57 | Ht 70.0 in | Wt 205.0 lb

## 2018-01-16 DIAGNOSIS — Z95810 Presence of automatic (implantable) cardiac defibrillator: Secondary | ICD-10-CM | POA: Diagnosis not present

## 2018-01-16 DIAGNOSIS — I255 Ischemic cardiomyopathy: Secondary | ICD-10-CM

## 2018-01-16 DIAGNOSIS — I2510 Atherosclerotic heart disease of native coronary artery without angina pectoris: Secondary | ICD-10-CM | POA: Diagnosis not present

## 2018-01-16 DIAGNOSIS — I5022 Chronic systolic (congestive) heart failure: Secondary | ICD-10-CM

## 2018-01-16 DIAGNOSIS — I481 Persistent atrial fibrillation: Secondary | ICD-10-CM | POA: Diagnosis not present

## 2018-01-16 DIAGNOSIS — I4819 Other persistent atrial fibrillation: Secondary | ICD-10-CM

## 2018-01-16 LAB — CUP PACEART INCLINIC DEVICE CHECK
Battery Remaining Longevity: 18 mo
Battery Voltage: 2.94 V
Brady Statistic AP VP Percent: 27.65 %
Brady Statistic AS VS Percent: 8.52 %
Brady Statistic RV Percent Paced: 82.04 %
Date Time Interrogation Session: 20190830123928
HIGH POWER IMPEDANCE MEASURED VALUE: 81 Ohm
Implantable Lead Location: 753858
Implantable Lead Location: 753859
Implantable Lead Model: 4398
Implantable Lead Model: 6935
Lead Channel Impedance Value: 361 Ohm
Lead Channel Impedance Value: 361 Ohm
Lead Channel Impedance Value: 418 Ohm
Lead Channel Impedance Value: 456 Ohm
Lead Channel Impedance Value: 456 Ohm
Lead Channel Impedance Value: 665 Ohm
Lead Channel Impedance Value: 665 Ohm
Lead Channel Impedance Value: 665 Ohm
Lead Channel Pacing Threshold Amplitude: 1.625 V
Lead Channel Pacing Threshold Pulse Width: 0.8 ms
Lead Channel Sensing Intrinsic Amplitude: 15.125 mV
Lead Channel Sensing Intrinsic Amplitude: 15.125 mV
Lead Channel Setting Pacing Amplitude: 0.75 V
Lead Channel Setting Pacing Amplitude: 2.75 V
Lead Channel Setting Pacing Pulse Width: 0.8 ms
Lead Channel Setting Pacing Pulse Width: 1 ms
MDC IDC LEAD IMPLANT DT: 20160414
MDC IDC LEAD IMPLANT DT: 20160414
MDC IDC LEAD IMPLANT DT: 20160414
MDC IDC LEAD LOCATION: 753860
MDC IDC MSMT LEADCHNL LV IMPEDANCE VALUE: 399 Ohm
MDC IDC MSMT LEADCHNL LV IMPEDANCE VALUE: 418 Ohm
MDC IDC MSMT LEADCHNL LV IMPEDANCE VALUE: 646 Ohm
MDC IDC MSMT LEADCHNL LV IMPEDANCE VALUE: 646 Ohm
MDC IDC MSMT LEADCHNL RA IMPEDANCE VALUE: 475 Ohm
MDC IDC MSMT LEADCHNL RA PACING THRESHOLD AMPLITUDE: 0.75 V
MDC IDC MSMT LEADCHNL RA PACING THRESHOLD PULSEWIDTH: 0.4 ms
MDC IDC MSMT LEADCHNL RA SENSING INTR AMPL: 0.75 mV
MDC IDC MSMT LEADCHNL RA SENSING INTR AMPL: 0.75 mV
MDC IDC MSMT LEADCHNL RV PACING THRESHOLD PULSEWIDTH: 1 ms
MDC IDC PG IMPLANT DT: 20160414
MDC IDC SET LEADCHNL RA PACING AMPLITUDE: 1.5 V
MDC IDC SET LEADCHNL RV SENSING SENSITIVITY: 0.3 mV
MDC IDC STAT BRADY AP VS PERCENT: 1.13 %
MDC IDC STAT BRADY AS VP PERCENT: 62.69 %
MDC IDC STAT BRADY RA PERCENT PACED: 25.97 %

## 2018-01-16 NOTE — Patient Instructions (Addendum)
Medication Instructions:  Your physician recommends that you continue on your current medications as directed. Please refer to the Current Medication list given to you today.  Labwork: None ordered.  Testing/Procedures: None ordered.  Follow-Up: Your physician wants you to follow-up in: 6 months with Dr. Ladona Ridgelaylor.   You will receive a reminder letter in the mail two months in advance. If you don't receive a letter, please call our office to schedule the follow-up appointment.  Remote monitoring is used to monitor your ICD from home. This monitoring reduces the number of office visits required to check your device to one time per year. It allows us to keep an eye on the functioning of your device to ensure it is working properly. You are scheduled for a device check from home on 02/25/2018. You may send your transmission at any time that day. If you have a wireless device, the transmission will be sent automatically. After your physician reviews your transmission, you will receive a postcard with your next transmission date.  Any Other Special Instructions Will Be Listed Below (If Applicable).  If you need a refill on your cardiac medications before your next appointment, please call your pharmacy.

## 2018-01-16 NOTE — Progress Notes (Signed)
HPI Mr. Allison Quarryrendt returns today to discuss his malfunctioning ICD. He is a pleasant 62 yo man with chronic systolic heart failure, EF 20%, persistent atrial fib s/p DCCV, who was found to have an occluded left SCV several weeks ago. He has diaphragmatic stimulation on his RV lead. He was admitted a month ago for initiation of dofetilide therapy. He returns today for followup. In the interim, he has done well. IN NSR, his energy level is improved. No stimulation.  No Known Allergies   Current Outpatient Medications  Medication Sig Dispense Refill  . acetaminophen (TYLENOL) 325 MG tablet Take 1-2 tablets (325-650 mg total) by mouth every 4 (four) hours as needed for mild pain.    Marland Kitchen. atorvastatin (LIPITOR) 40 MG tablet Take 1 tablet (40 mg total) by mouth daily. 90 tablet 3  . carvedilol (COREG) 6.25 MG tablet Take 2 tablets (12.5 mg total) by mouth 2 (two) times daily with a meal. 360 tablet 3  . dofetilide (TIKOSYN) 250 MCG capsule Take 1 capsule (250 mcg total) by mouth 2 (two) times daily. 60 capsule 6  . furosemide (LASIX) 40 MG tablet TAKE 1 TABLET BY MOUTH DAILY. 30 tablet 10  . Multiple Vitamins-Minerals (HAIR/SKIN/NAILS PO) Take 1 tablet by mouth daily.    Marland Kitchen. spironolactone (ALDACTONE) 25 MG tablet Take 1 tablet (25 mg total) by mouth daily. 90 tablet 3  . warfarin (COUMADIN) 5 MG tablet TAKE 1 TABLET DAILY, EXCEPT ON SUNDAYS TAKE 1 AND 1/2 TABLETS. (OR AS DIRECTED BY COUMADIN CLINIC) 40 tablet 3   No current facility-administered medications for this visit.      Past Medical History:  Diagnosis Date  . AICD (automatic cardioverter/defibrillator) present   . Atrial fibrillation, chronic (HCC)   . CAD (coronary artery disease)    a. s/p CABG x 3 03/2013 (L-LAD, S-RI, S-PDA) in [place]  . CAP (community acquired pneumonia) 08/22/2015  . CHF (congestive heart failure) (HCC)   . Chronic combined systolic and diastolic heart failure [facility](HCC) November 2014  . Coronary artery disease   .  HLD (hyperlipidemia)    . Hyperbilirubinemia   . Ischemic cardiomyopathy    a. echo (10/15):  EF 20%, diff HK, apical AK, mobile density inf wall near papillary muscle (?ruptured) with mod MR, mod reduced RVSF, mild PI, PASP 37 mmHg b. s/p CRTD   . LBBB (left bundle branch block)    . LV (left ventricular) mural thrombus     a. TEE (10/15):  EF 15-20%, septum and ant walls AK, lat wall HK, no papillary muscle rupture, probable small thrombus on ant/ant-septal wall, mild MR, mod decreased RVSF, neg bubble study  . Mitral regurgitation     mild by TEE 02/2014  . NSVT (nonsustained ventricular tachycardia) (HCC)        . Obesity   . Pulmonary hypertension (HCC)   . Umbilical hernia    "have had it since age 62/24"    ROS:   All systems reviewed and negative except as noted in the HPI.   Past Surgical History:  Procedure Laterality Date  . BI-VENTRICULAR IMPLANTABLE CARDIOVERTER DEFIBRILLATOR N/A 09/01/2014   MDT CRTD implanted by Dr Ladona Ridgelaylor  . CARDIAC CATHETERIZATION  03/2013  . CARDIOVERSION N/A 12/04/2017   Procedure: CARDIOVERSION;  Surgeon: Marinus Mawaylor, Shanequia Kendrick W, MD;  [facility] INVASIVE CV LAB;  Service: Cardiovascular;  Laterality: N/A;  . CORONARY ARTERY BYPASS GRAFT  03/2013   LIMA to the LAD, SVG to the ramus, SVG  to the PDA  . CORONARY ARTERY BYPASS GRAFT  2014   CABG "X3"  . FRACTURE SURGERY    . INSERT / REPLACE / REMOVE PACEMAKER  2016  . LEAD REVISION/REPAIR N/A 12/04/2017   Procedure: LEAD REVISION/REPAIR;  Surgeon: Marinus Maw, MD;  Location: [facility] INVASIVE CV LAB;  Service: Cardiovascular;  Laterality: N/A;  . PATELLA FRACTURE SURGERY Left 1972  . TEE WITHOUT CARDIOVERSION N/A 03/04/2014   Procedure: TRANSESOPHAGEAL ECHOCARDIOGRAM (TEE);  Surgeon: Laurey Morale, MD;  Location: [facility] ENDOSCOPY;  Service: Cardiovascular;  Laterality: N/A;  . TEE WITHOUT CARDIOVERSION N/A 12/02/2017   Procedure: TRANSESOPHAGEAL ECHOCARDIOGRAM (TEE);  Surgeon: Jodelle Red, MD;   Location: [facility] ENDOSCOPY;  Service: Cardiovascular;  Laterality: N/A;     Family History  Problem Relation Age of Onset  . Diabetes Mother        Deceased  . Thyroid disease Mother      Social History   Socioeconomic History  . Marital status: Divorced    Spouse name: Not on file  . Number of children: Not on file  . Years of education: Not on file  . Highest education level: Not on file  Occupational History  . Not on file  Social Needs  . Financial resource strain: Not on file  . Food insecurity:    Worry: Not on file    Inability: Not on file  . Transportation needs:    Medical: Not on file    Non-medical: Not on file  Tobacco Use  . Smoking status: Current Every Day Smoker    Packs/day: 0.12    Years: 47.00    Pack years: 5.64    Types: Cigarettes  . Smokeless tobacco: Never Used  Substance and Sexual Activity  . Alcohol use: Not Currently    Alcohol/week: 0.0 standard drinks    Comment: 12/03/2017 "quit drinking when I was 23"  . Drug use: No    Types: Marijuana    Comment: 12/03/2017 "weekly"  . Sexual activity: Not Currently  Lifestyle  . Physical activity:    Days per week: Not on file    Minutes per session: Not on file  . Stress: Not on file  Relationships  . Social connections:    Talks on phone: Not on file    Gets together: Not on file    Attends religious service: Not on file    Active member of club or organization: Not on file    Attends meetings of clubs or organizations: Not on file    Relationship status: Not on file  . Intimate partner violence:    Fear of current or ex partner: Not on file    Emotionally abused: Not on file    Physically abused: Not on file    Forced sexual activity: Not on file  Other Topics Concern  . Not on file  Social History Narrative   ** Merged History Encounter **         Ht 5\' 10"  (1.778 m)   Wt 205 lb (93 kg)   BMI 29.41 kg/m   Physical Exam:  Well appearing middle aged man, NAD HEENT:  Unremarkable Neck:  6 cm JVD, no thyromegally Lymphatics:  No adenopathy Back:  No CVA tenderness Lungs:  Clear with no wheezes HEART:  Regular rate rhythm, no murmurs, no rubs, no clicks Abd:  soft, positive bowel sounds, no organomegally, no rebound, no guarding Ext:  2 plus pulses, no edema, no cyanosis, no clubbing Skin:  No  rashes no nodules Neuro:  CN II through XII intact, motor grossly intact  EKG - nsr with biv pacing  DEVICE  Normal device function.  See PaceArt for details. His threshold is 0.5 and his RV output is programmed at 0.75.   Assess/Plan:  1. ICD - his RV lead has not caused any diaphragmatic stimulation and his has about a 1.5 years until he reaches ERI. He will undergo watchful waiting. At the time of his gen change, I would anticipate placing a new RV lead. 2. Chronic systolic heart failure - his symptoms are class 2 in NSR. He will continue his current meds. 3. PAF - he is maintaining NSR on dofetilide. 4. CAD - he denies anginal symptoms. He is encouraged to maintain a low sodium diet and remain active.  Leonia Reeves.D.

## 2018-01-30 ENCOUNTER — Ambulatory Visit (INDEPENDENT_AMBULATORY_CARE_PROVIDER_SITE_OTHER): Payer: Medicare Other

## 2018-01-30 DIAGNOSIS — Z5181 Encounter for therapeutic drug level monitoring: Secondary | ICD-10-CM

## 2018-01-30 DIAGNOSIS — I4819 Other persistent atrial fibrillation: Secondary | ICD-10-CM

## 2018-01-30 DIAGNOSIS — I481 Persistent atrial fibrillation: Secondary | ICD-10-CM | POA: Diagnosis not present

## 2018-01-30 LAB — POCT INR: INR: 2.6 (ref 2.0–3.0)

## 2018-01-30 NOTE — Patient Instructions (Signed)
Description   Continue taking 1 tablet daily except 1/2 tablet on Sundays. Recheck INR in 6 weeks.  Call Coumadin Clinic if placed on any new medications or scheduled for any procedures - [phone].

## 2018-02-25 ENCOUNTER — Ambulatory Visit (INDEPENDENT_AMBULATORY_CARE_PROVIDER_SITE_OTHER): Payer: Medicare Other | Admitting: *Deleted

## 2018-02-25 DIAGNOSIS — I255 Ischemic cardiomyopathy: Secondary | ICD-10-CM | POA: Diagnosis not present

## 2018-02-25 NOTE — Progress Notes (Signed)
Remote ICD transmission.   

## 2018-03-13 ENCOUNTER — Ambulatory Visit (INDEPENDENT_AMBULATORY_CARE_PROVIDER_SITE_OTHER): Payer: Medicare Other | Admitting: *Deleted

## 2018-03-13 DIAGNOSIS — Z5181 Encounter for therapeutic drug level monitoring: Secondary | ICD-10-CM | POA: Diagnosis not present

## 2018-03-13 LAB — POCT INR: INR: 1.5 — AB (ref 2.0–3.0)

## 2018-03-13 NOTE — Patient Instructions (Signed)
Description   Today take 1.5 tablets and tomorrow take 1.5 tablets then continue taking 1 tablet daily except 1/2 tablet on Sundays. Recheck INR in 2 weeks.  Call Coumadin Clinic if placed on any new medications or scheduled for any procedures - [phone].

## 2018-03-27 ENCOUNTER — Ambulatory Visit (INDEPENDENT_AMBULATORY_CARE_PROVIDER_SITE_OTHER): Payer: Medicare Other | Admitting: Pharmacist

## 2018-03-27 DIAGNOSIS — Z5181 Encounter for therapeutic drug level monitoring: Secondary | ICD-10-CM

## 2018-03-27 LAB — POCT INR: INR: 2.5 (ref 2.0–3.0)

## 2018-03-27 NOTE — Patient Instructions (Signed)
Description   Continue taking 1 tablet daily except 1/2 tablet on Sundays. Recheck INR in 4 weeks.  Call Coumadin Clinic if placed on any new medications or scheduled for any procedures - [phone].

## 2018-04-02 LAB — CUP PACEART REMOTE DEVICE CHECK
Brady Statistic AP VP Percent: 31.31 %
Brady Statistic AP VS Percent: 0.42 %
Brady Statistic RV Percent Paced: 85.53 %
Date Time Interrogation Session: 20191009042203
HighPow Impedance: 88 Ohm
Implantable Lead Implant Date: 20160414
Implantable Lead Location: 753860
Implantable Lead Model: 4398
Implantable Lead Model: 5076
Implantable Lead Model: 6935
Implantable Pulse Generator Implant Date: 20160414
Lead Channel Impedance Value: 456 Ohm
Lead Channel Impedance Value: 456 Ohm
Lead Channel Impedance Value: 475 Ohm
Lead Channel Impedance Value: 475 Ohm
Lead Channel Impedance Value: 513 Ohm
Lead Channel Impedance Value: 532 Ohm
Lead Channel Impedance Value: 779 Ohm
Lead Channel Pacing Threshold Amplitude: 0.625 V
Lead Channel Pacing Threshold Amplitude: 0.75 V
Lead Channel Pacing Threshold Amplitude: 2 V
Lead Channel Pacing Threshold Pulse Width: 0.4 ms
Lead Channel Sensing Intrinsic Amplitude: 0.75 mV
Lead Channel Sensing Intrinsic Amplitude: 0.75 mV
Lead Channel Sensing Intrinsic Amplitude: 11.875 mV
Lead Channel Setting Pacing Amplitude: 1.5 V
Lead Channel Setting Pacing Amplitude: 3 V
Lead Channel Setting Pacing Pulse Width: 0.4 ms
Lead Channel Setting Sensing Sensitivity: 0.3 mV
MDC IDC LEAD IMPLANT DT: 20160414
MDC IDC LEAD IMPLANT DT: 20160414
MDC IDC LEAD LOCATION: 753858
MDC IDC LEAD LOCATION: 753859
MDC IDC MSMT BATTERY REMAINING LONGEVITY: 20 mo
MDC IDC MSMT BATTERY VOLTAGE: 2.94 V
MDC IDC MSMT LEADCHNL LV IMPEDANCE VALUE: 475 Ohm
MDC IDC MSMT LEADCHNL LV IMPEDANCE VALUE: 760 Ohm
MDC IDC MSMT LEADCHNL LV IMPEDANCE VALUE: 760 Ohm
MDC IDC MSMT LEADCHNL LV IMPEDANCE VALUE: 779 Ohm
MDC IDC MSMT LEADCHNL LV IMPEDANCE VALUE: 779 Ohm
MDC IDC MSMT LEADCHNL LV PACING THRESHOLD PULSEWIDTH: 0.8 ms
MDC IDC MSMT LEADCHNL RA PACING THRESHOLD PULSEWIDTH: 0.4 ms
MDC IDC MSMT LEADCHNL RV IMPEDANCE VALUE: 399 Ohm
MDC IDC MSMT LEADCHNL RV SENSING INTR AMPL: 11.875 mV
MDC IDC SET LEADCHNL LV PACING PULSEWIDTH: 0.8 ms
MDC IDC SET LEADCHNL RV PACING AMPLITUDE: 1 V
MDC IDC STAT BRADY AS VP PERCENT: 60.88 %
MDC IDC STAT BRADY AS VS PERCENT: 7.39 %
MDC IDC STAT BRADY RA PERCENT PACED: 29.34 %

## 2018-04-15 ENCOUNTER — Telehealth: Payer: Self-pay | Admitting: Internal Medicine

## 2018-04-15 ENCOUNTER — Other Ambulatory Visit: Payer: Self-pay | Admitting: *Deleted

## 2018-04-15 ENCOUNTER — Other Ambulatory Visit: Payer: Self-pay | Admitting: Cardiology

## 2018-04-15 MED ORDER — FUROSEMIDE 40 MG PO TABS: 40.0000 mg | ORAL_TABLET | Freq: Every day | ORAL | 2 refills | Status: DC

## 2018-04-15 NOTE — Telephone Encounter (Signed)
 *  STAT* If patient is at the pharmacy, call can be transferred to refill team.   1. Which medications need to be refilled? (please list name of each medication and dose if known) furosemide (LASIX) 40 MG tablet  2. Which pharmacy/location (including street and city if local pharmacy) is medication to be sent to? [facility]  3. Do they need a 30 day or 90 day supply? [phone]  Pharmacist is requesting script for 90 days since all his other medications are 90 days

## 2018-04-24 ENCOUNTER — Ambulatory Visit (INDEPENDENT_AMBULATORY_CARE_PROVIDER_SITE_OTHER): Payer: Medicare Other | Admitting: Pharmacist

## 2018-04-24 DIAGNOSIS — Z5181 Encounter for therapeutic drug level monitoring: Secondary | ICD-10-CM

## 2018-04-24 LAB — POCT INR: INR: 2.9 (ref 2.0–3.0)

## 2018-04-24 NOTE — Patient Instructions (Signed)
Description   Continue taking 1 tablet daily except 1/2 tablet on Sundays. Recheck INR in 5 weeks.  Call Coumadin Clinic if placed on any new medications or scheduled for any procedures - [phone].

## 2018-05-27 ENCOUNTER — Ambulatory Visit (INDEPENDENT_AMBULATORY_CARE_PROVIDER_SITE_OTHER): Payer: Medicare Other

## 2018-05-27 DIAGNOSIS — I5022 Chronic systolic (congestive) heart failure: Secondary | ICD-10-CM

## 2018-05-27 DIAGNOSIS — I255 Ischemic cardiomyopathy: Secondary | ICD-10-CM

## 2018-05-28 LAB — CUP PACEART REMOTE DEVICE CHECK
Battery Remaining Longevity: 22 mo
Battery Voltage: 2.93 V
Brady Statistic AP VP Percent: 26.73 %
Brady Statistic AP VS Percent: 0.24 %
Brady Statistic RA Percent Paced: 25.63 %
Brady Statistic RV Percent Paced: 89.59 %
HIGH POWER IMPEDANCE MEASURED VALUE: 95 Ohm
Implantable Lead Implant Date: 20160414
Implantable Lead Implant Date: 20160414
Implantable Lead Location: 753858
Implantable Lead Model: 4398
Implantable Pulse Generator Implant Date: 20160414
Lead Channel Impedance Value: 418 Ohm
Lead Channel Impedance Value: 475 Ohm
Lead Channel Impedance Value: 513 Ohm
Lead Channel Impedance Value: 532 Ohm
Lead Channel Impedance Value: 532 Ohm
Lead Channel Impedance Value: 532 Ohm
Lead Channel Impedance Value: 817 Ohm
Lead Channel Impedance Value: 817 Ohm
Lead Channel Impedance Value: 817 Ohm
Lead Channel Pacing Threshold Pulse Width: 0.4 ms
Lead Channel Sensing Intrinsic Amplitude: 0.875 mV
Lead Channel Sensing Intrinsic Amplitude: 3.25 mV
Lead Channel Sensing Intrinsic Amplitude: 3.25 mV
Lead Channel Setting Pacing Amplitude: 1.5 V
Lead Channel Setting Pacing Pulse Width: 0.8 ms
MDC IDC LEAD IMPLANT DT: 20160414
MDC IDC LEAD LOCATION: 753859
MDC IDC LEAD LOCATION: 753860
MDC IDC MSMT LEADCHNL LV IMPEDANCE VALUE: 532 Ohm
MDC IDC MSMT LEADCHNL LV IMPEDANCE VALUE: 836 Ohm
MDC IDC MSMT LEADCHNL LV IMPEDANCE VALUE: 874 Ohm
MDC IDC MSMT LEADCHNL LV PACING THRESHOLD AMPLITUDE: 2 V
MDC IDC MSMT LEADCHNL LV PACING THRESHOLD PULSEWIDTH: 0.8 ms
MDC IDC MSMT LEADCHNL RA PACING THRESHOLD AMPLITUDE: 0.75 V
MDC IDC MSMT LEADCHNL RA SENSING INTR AMPL: 0.875 mV
MDC IDC MSMT LEADCHNL RV IMPEDANCE VALUE: 551 Ohm
MDC IDC MSMT LEADCHNL RV PACING THRESHOLD AMPLITUDE: 0.75 V
MDC IDC MSMT LEADCHNL RV PACING THRESHOLD PULSEWIDTH: 0.4 ms
MDC IDC SESS DTM: 20200108062503
MDC IDC SET LEADCHNL LV PACING AMPLITUDE: 3.25 V
MDC IDC SET LEADCHNL RV PACING AMPLITUDE: 1.25 V
MDC IDC SET LEADCHNL RV PACING PULSEWIDTH: 0.4 ms
MDC IDC SET LEADCHNL RV SENSING SENSITIVITY: 0.3 mV
MDC IDC STAT BRADY AS VP PERCENT: 67.69 %
MDC IDC STAT BRADY AS VS PERCENT: 5.34 %

## 2018-05-28 NOTE — Progress Notes (Signed)
Remote ICD transmission.   

## 2018-05-29 ENCOUNTER — Ambulatory Visit (INDEPENDENT_AMBULATORY_CARE_PROVIDER_SITE_OTHER): Payer: Medicare Other | Admitting: *Deleted

## 2018-05-29 DIAGNOSIS — Z5181 Encounter for therapeutic drug level monitoring: Secondary | ICD-10-CM

## 2018-05-29 LAB — POCT INR: INR: 2.4 (ref 2.0–3.0)

## 2018-05-29 NOTE — Patient Instructions (Signed)
Description   Continue taking 1 tablet daily except 1/2 tablet on Sundays. Recheck INR in 6 weeks.  Call Coumadin Clinic if placed on any new medications or scheduled for any procedures - [phone].      

## 2018-07-09 ENCOUNTER — Other Ambulatory Visit: Payer: Self-pay | Admitting: Physician Assistant

## 2018-07-10 ENCOUNTER — Ambulatory Visit (INDEPENDENT_AMBULATORY_CARE_PROVIDER_SITE_OTHER): Payer: Medicare Other

## 2018-07-10 DIAGNOSIS — Z5181 Encounter for therapeutic drug level monitoring: Secondary | ICD-10-CM

## 2018-07-10 LAB — POCT INR: INR: 3 (ref 2.0–3.0)

## 2018-07-10 NOTE — Patient Instructions (Signed)
Description   Continue on same dosage 1 tablet daily except 1/2 tablet on Sundays. Recheck INR in 6 weeks.  Call Coumadin Clinic if placed on any new medications or scheduled for any procedures - [phone].

## 2018-07-11 ENCOUNTER — Other Ambulatory Visit: Payer: Self-pay | Admitting: Physician Assistant

## 2018-07-16 ENCOUNTER — Ambulatory Visit (INDEPENDENT_AMBULATORY_CARE_PROVIDER_SITE_OTHER): Payer: Medicare Other | Admitting: Internal Medicine

## 2018-07-16 ENCOUNTER — Encounter: Payer: Self-pay | Admitting: Internal Medicine

## 2018-07-16 VITALS — BP 122/68 | HR 51 | Ht 70.0 in | Wt 210.2 lb

## 2018-07-16 DIAGNOSIS — I4819 Other persistent atrial fibrillation: Secondary | ICD-10-CM

## 2018-07-16 DIAGNOSIS — I2510 Atherosclerotic heart disease of native coronary artery without angina pectoris: Secondary | ICD-10-CM | POA: Diagnosis not present

## 2018-07-16 DIAGNOSIS — I255 Ischemic cardiomyopathy: Secondary | ICD-10-CM | POA: Diagnosis not present

## 2018-07-16 DIAGNOSIS — Z95810 Presence of automatic (implantable) cardiac defibrillator: Secondary | ICD-10-CM | POA: Diagnosis not present

## 2018-07-16 DIAGNOSIS — I5022 Chronic systolic (congestive) heart failure: Secondary | ICD-10-CM | POA: Diagnosis not present

## 2018-07-16 NOTE — Progress Notes (Signed)
HPI Joseph Carson returns today to discuss his malfunctioning ICD. He is a pleasant 63 yo man with chronic systolic heart failure, EF 20%, persistent atrial fib s/p DCCV, who was found to have an occluded left SCV several weeks ago. He has diaphragmatic stimulation on his RV lead. He was admitted a month ago for initiation of dofetilide therapy. He returns today for followup. In the interim, he has done well. IN NSR, his energy level is improved. No stimulation after his outputs were reduced.  No Known Allergies   Current Outpatient Medications  Medication Sig Dispense Refill  . acetaminophen (TYLENOL) 325 MG tablet Take 1-2 tablets (325-650 mg total) by mouth every 4 (four) hours as needed for mild pain.    Marland Kitchen atorvastatin (LIPITOR) 40 MG tablet TAKE 1 TABLET BY MOUTH DAILY 90 tablet 3  . carvedilol (COREG) 6.25 MG tablet TAKE 2 TABLETS WITH FOOD TWICE DAILY 360 tablet 3  . dofetilide (TIKOSYN) 250 MCG capsule TAKE 1 CAPSULE BY MOUTH TWICE DAILY 180 capsule 1  . furosemide (LASIX) 40 MG tablet Take 1 tablet (40 mg total) by mouth daily. 90 tablet 2  . Multiple Vitamins-Minerals (HAIR/SKIN/NAILS PO) Take 1 tablet by mouth daily.    Marland Kitchen spironolactone (ALDACTONE) 25 MG tablet TAKE 1 TABLET BY MOUTH DAILY 90 tablet 3  . warfarin (COUMADIN) 5 MG tablet Take as directed by [facility] 35 tablet 3   No current facility-administered medications for this visit.      Past Medical History:  Diagnosis Date  . AICD (automatic cardioverter/defibrillator) present   . Atrial fibrillation, chronic   . CAD (coronary artery disease)    a. s/p CABG x 3 03/2013 (L-LAD, S-RI, S-PDA) in [place]  . CAP (community acquired pneumonia) 08/22/2015  . CHF (congestive heart failure) (HCC)   . Chronic combined systolic and diastolic heart failure [facility]) November 2014  . Coronary artery disease   . HLD (hyperlipidemia)    . Hyperbilirubinemia   . Ischemic cardiomyopathy    a. echo (10/15):  EF 20%, diff HK,  apical AK, mobile density inf wall near papillary muscle (?ruptured) with mod MR, mod reduced RVSF, mild PI, PASP 37 mmHg b. s/p CRTD   . LBBB (left bundle branch block)    . LV (left ventricular) mural thrombus     a. TEE (10/15):  EF 15-20%, septum and ant walls AK, lat wall HK, no papillary muscle rupture, probable small thrombus on ant/ant-septal wall, mild MR, mod decreased RVSF, neg bubble study  . Mitral regurgitation     mild by TEE 02/2014  . NSVT (nonsustained ventricular tachycardia) (HCC)        . Obesity   . Pulmonary hypertension (HCC)   . Umbilical hernia    "have had it since age [age]"    ROS:   All systems reviewed and negative except as noted in the HPI.   Past Surgical History:  Procedure Laterality Date  . BI-VENTRICULAR IMPLANTABLE CARDIOVERTER DEFIBRILLATOR N/A 09/01/2014   MDT CRTD implanted by Dr Ladona Ridgel  . CARDIAC CATHETERIZATION  03/2013  . CARDIOVERSION N/A 12/04/2017   Procedure: CARDIOVERSION;  Surgeon: Marinus Maw, MD;  Location: [facility] INVASIVE CV LAB;  Service: Cardiovascular;  Laterality: N/A;  . CORONARY ARTERY BYPASS GRAFT  03/2013   LIMA to the LAD, SVG to the ramus, SVG to the PDA  . CORONARY ARTERY BYPASS GRAFT  2014   CABG "X3"  . FRACTURE SURGERY    . INSERT /  REPLACE / REMOVE PACEMAKER  2016  . LEAD REVISION/REPAIR N/A 12/04/2017   Procedure: LEAD REVISION/REPAIR;  Surgeon: Marinus Maw, MD;  Location: [facility] INVASIVE CV LAB;  Service: Cardiovascular;  Laterality: N/A;  . PATELLA FRACTURE SURGERY Left 1972  . TEE WITHOUT CARDIOVERSION N/A 03/04/2014   Procedure: TRANSESOPHAGEAL ECHOCARDIOGRAM (TEE);  Surgeon: Laurey Morale, MD;  Location: [facility] ENDOSCOPY;  Service: Cardiovascular;  Laterality: N/A;  . TEE WITHOUT CARDIOVERSION N/A 12/02/2017   Procedure: TRANSESOPHAGEAL ECHOCARDIOGRAM (TEE);  Surgeon: Jodelle Red, MD;  Location: [facility] ENDOSCOPY;  Service: Cardiovascular;  Laterality: N/A;     Family History  Problem Relation Age of  Onset  . Diabetes Mother        Deceased  . Thyroid disease Mother      Social History   Socioeconomic History  . Marital status: Divorced    Spouse name: Not on file  . Number of children: Not on file  . Years of education: Not on file  . Highest education level: Not on file  Occupational History  . Not on file  Social Needs  . Financial resource strain: Not on file  . Food insecurity:    Worry: Not on file    Inability: Not on file  . Transportation needs:    Medical: Not on file    Non-medical: Not on file  Tobacco Use  . Smoking status: Current Every Day Smoker    Packs/day: 0.12    Years: 47.00    Pack years: 5.64    Types: Cigarettes  . Smokeless tobacco: Never Used  Substance and Sexual Activity  . Alcohol use: Not Currently    Alcohol/week: 0.0 standard drinks    Comment: 12/03/2017 "quit drinking when I was 23"  . Drug use: No    Types: Marijuana    Comment: 12/03/2017 "weekly"  . Sexual activity: Not Currently  Lifestyle  . Physical activity:    Days per week: Not on file    Minutes per session: Not on file  . Stress: Not on file  Relationships  . Social connections:    Talks on phone: Not on file    Gets together: Not on file    Attends religious service: Not on file    Active member of club or organization: Not on file    Attends meetings of clubs or organizations: Not on file    Relationship status: Not on file  . Intimate partner violence:    Fear of current or ex partner: Not on file    Emotionally abused: Not on file    Physically abused: Not on file    Forced sexual activity: Not on file  Other Topics Concern  . Not on file  Social History Narrative   ** Merged History Encounter **         BP 122/68   Pulse (!) 51   Ht 5\' 10"  (1.778 m)   Wt 210 lb 3.2 oz (95.3 kg)   SpO2 98%   BMI 30.16 kg/m   Physical Exam:  Well appearing NAD HEENT: Unremarkable Neck:  No JVD, no thyromegally Lymphatics:  No adenopathy Back:  No CVA  tenderness Lungs:  Clear with no wheezes HEART:  Regular rate rhythm, no murmurs, no rubs, no clicks Abd:  soft, positive bowel sounds, no organomegally, no rebound, no guarding Ext:  2 plus pulses, no edema, no cyanosis, no clubbing Skin:  No rashes no nodules Neuro:  CN II through XII intact, motor grossly intact  EKG - nsr with biv pacing  DEVICE  Normal device function.  See PaceArt for details.   Assess/Plan: 1. Chronic systolic heart failure - he appears to well compensated class 2. He will continue his current meds. 2. ICD - his medtronic Biv ICD device is working normally. We will recheck in several months. 3. PAF - he is maintaining NSR. No change in meds. 4. CAD - he is s/p CABG. He denies anginal symptoms.  Leonia Reeves.D.

## 2018-07-16 NOTE — Patient Instructions (Signed)
Medication Instructions:  Your physician recommends that you continue on your current medications as directed. Please refer to the Current Medication list given to you today.  Labwork: None ordered.  Testing/Procedures: None ordered.  Follow-Up: Your physician wants you to follow-up in: one year with Dr. Ladona Ridgel.   You will receive a reminder letter in the mail two months in advance. If you don't receive a letter, please call our office to schedule the follow-up appointment.  Remote monitoring is used to monitor your ICD from home. This monitoring reduces the number of office visits required to check your device to one time per year. It allows [place] to keep an eye on the functioning of your device to ensure it is working properly. You are scheduled for a device check from home on 08/26/2018. You may send your transmission at any time that day. If you have a wireless device, the transmission will be sent automatically. After your physician reviews your transmission, you will receive a postcard with your next transmission date.  Any Other Special Instructions Will Be Listed Below (If Applicable).  If you need a refill on your cardiac medications before your next appointment, please call your pharmacy.

## 2018-07-17 LAB — CUP PACEART INCLINIC DEVICE CHECK
Date Time Interrogation Session: 20200228102810
Implantable Lead Implant Date: 20160414
Implantable Lead Implant Date: 20160414
Implantable Lead Implant Date: 20160414
Implantable Lead Location: 753858
Implantable Lead Location: 753859
Implantable Lead Location: 753860
Implantable Lead Model: 4398
Implantable Lead Model: 5076
Implantable Lead Model: 6935
Implantable Pulse Generator Implant Date: 20160414

## 2018-07-21 ENCOUNTER — Telehealth: Payer: Self-pay | Admitting: Nurse Practitioner

## 2018-07-21 DIAGNOSIS — I5022 Chronic systolic (congestive) heart failure: Secondary | ICD-10-CM

## 2018-07-21 DIAGNOSIS — I4729 Other ventricular tachycardia: Secondary | ICD-10-CM

## 2018-07-21 DIAGNOSIS — I472 Ventricular tachycardia: Secondary | ICD-10-CM

## 2018-07-21 NOTE — Telephone Encounter (Signed)
CareAlert received for ICD shock. Episode of PMVT on 07/20/18 at 10:31PM terminated with 1 HV shock. Pt is on Tikosyn for atrial arrhythmias. Left message for patient to call to evaluate symptoms. Will need BMET, Mg, EKG to evaluate QT interval.     Gypsy Balsam, NP 07/21/2018 8:29 AM

## 2018-07-21 NOTE — Telephone Encounter (Signed)
Patient called back. He stated that he felt dizzy around 8-9PM. He stated that he doesn't recall getting shocked. He feels good today. Informed patient of driving restrictions. Informed pt that I would send note to Gypsy Balsam, NP. Patient verbalized understanding.

## 2018-07-22 NOTE — Telephone Encounter (Signed)
Call returned to Pt.  Advised he was scheduled for a nurse visit with labs on August 03, 2018 at 2:00 pm.  Pt indicates understanding.

## 2018-07-22 NOTE — Telephone Encounter (Signed)
Patient calling back. Informed him that MD nurse will call to schedule labs and nurse visits. Pt verbalized understanding.

## 2018-07-24 ENCOUNTER — Other Ambulatory Visit: Payer: Self-pay | Admitting: Internal Medicine

## 2018-08-03 ENCOUNTER — Other Ambulatory Visit: Payer: Medicare Other

## 2018-08-03 ENCOUNTER — Ambulatory Visit: Payer: Medicare Other

## 2018-08-04 ENCOUNTER — Telehealth: Payer: Self-pay

## 2018-08-04 ENCOUNTER — Other Ambulatory Visit: Payer: Medicare Other

## 2018-08-04 ENCOUNTER — Ambulatory Visit: Payer: Medicare Other

## 2018-08-04 NOTE — Telephone Encounter (Signed)
Cancelled nurse visit for 08/04/2018.  Pt scheduled to see Dr. Ladona Ridgel 08/05/2018 for ATP and repeat labs.

## 2018-08-05 ENCOUNTER — Ambulatory Visit (INDEPENDENT_AMBULATORY_CARE_PROVIDER_SITE_OTHER): Payer: Medicare Other | Admitting: Internal Medicine

## 2018-08-05 ENCOUNTER — Other Ambulatory Visit: Payer: Self-pay

## 2018-08-05 ENCOUNTER — Encounter: Payer: Self-pay | Admitting: Internal Medicine

## 2018-08-05 ENCOUNTER — Other Ambulatory Visit: Payer: Self-pay | Admitting: Cardiology

## 2018-08-05 VITALS — BP 122/80 | HR 56 | Ht 70.0 in | Wt 213.0 lb

## 2018-08-05 DIAGNOSIS — I5022 Chronic systolic (congestive) heart failure: Secondary | ICD-10-CM

## 2018-08-05 DIAGNOSIS — I4819 Other persistent atrial fibrillation: Secondary | ICD-10-CM | POA: Diagnosis not present

## 2018-08-05 DIAGNOSIS — Z95810 Presence of automatic (implantable) cardiac defibrillator: Secondary | ICD-10-CM | POA: Diagnosis not present

## 2018-08-05 DIAGNOSIS — I255 Ischemic cardiomyopathy: Secondary | ICD-10-CM

## 2018-08-05 DIAGNOSIS — I2510 Atherosclerotic heart disease of native coronary artery without angina pectoris: Secondary | ICD-10-CM

## 2018-08-05 LAB — BASIC METABOLIC PANEL
BUN/Creatinine Ratio: 16 (ref 10–24)
BUN: 19 mg/dL (ref 8–27)
CO2: 25 mmol/L (ref 20–29)
Calcium: 10.1 mg/dL (ref 8.6–10.2)
Chloride: 99 mmol/L (ref 96–106)
Creatinine, Ser: 1.22 mg/dL (ref 0.76–1.27)
GFR calc Af Amer: 72 mL/min/{1.73_m2} (ref >59)
GFR calc non Af Amer: 63 mL/min/{1.73_m2} (ref >59)
Glucose: 80 mg/dL (ref 65–99)
Potassium: 4.6 mmol/L (ref 3.5–5.2)
Sodium: 141 mmol/L (ref 134–144)

## 2018-08-05 LAB — CBC WITH DIFFERENTIAL/PLATELET
BASOS: 1 %
Basophils Absolute: 0.1 10*3/uL (ref 0.0–0.2)
EOS (ABSOLUTE): 0.2 10*3/uL (ref 0.0–0.4)
Eos: 2 %
Hematocrit: 48.4 % (ref 37.5–51.0)
Hemoglobin: 16.8 g/dL (ref 13.0–17.7)
Immature Grans (Abs): 0 10*3/uL (ref 0.0–0.1)
Immature Granulocytes: 0 %
Lymphocytes Absolute: 1.9 10*3/uL (ref 0.7–3.1)
Lymphs: 18 %
MCH: 31.2 pg (ref 26.6–33.0)
MCHC: 34.7 g/dL (ref 31.5–35.7)
MCV: 90 fL (ref 79–97)
MONOS ABS: 0.8 10*3/uL (ref 0.1–0.9)
Monocytes: 8 %
Neutrophils Absolute: 7.5 10*3/uL — ABNORMAL HIGH (ref 1.4–7.0)
Neutrophils: 71 %
Platelets: 211 10*3/uL (ref 150–450)
RBC: 5.39 x10E6/uL (ref 4.14–5.80)
RDW: 12.8 % (ref 11.6–15.4)
WBC: 10.5 10*3/uL (ref 3.4–10.8)

## 2018-08-05 LAB — MAGNESIUM: Magnesium: 1.9 mg/dL (ref 1.6–2.3)

## 2018-08-05 NOTE — Progress Notes (Signed)
    HPI Mr. Morriss returns today for followup. He was sleeping and received an ICD shock. He denies medical non-compliance. He has felt well. No anginal symptoms. He denies palpitations. He was not sure what happended the night he received his ICD shock. He has known RV/ICD lead dysfunction. We tried to place a new lead in August of 2009 but his SCV was occluded.  No Known Allergies   Current Outpatient Medications  Medication Sig Dispense Refill  . acetaminophen (TYLENOL) 325 MG tablet Take 1-2 tablets (325-650 mg total) by mouth every 4 (four) hours as needed for mild pain.    . atorvastatin (LIPITOR) 40 MG tablet TAKE 1 TABLET BY MOUTH DAILY 90 tablet 3  . carvedilol (COREG) 6.25 MG tablet TAKE 2 TABLETS WITH FOOD TWICE DAILY 360 tablet 3  . dofetilide (TIKOSYN) 250 MCG capsule TAKE 1 CAPSULE BY MOUTH TWICE DAILY 180 capsule 1  . furosemide (LASIX) 40 MG tablet Take 1 tablet (40 mg total) by mouth daily. 90 tablet 2  . Multiple Vitamins-Minerals (HAIR/SKIN/NAILS PO) Take 1 tablet by mouth daily.    . spironolactone (ALDACTONE) 25 MG tablet TAKE 1 TABLET BY MOUTH DAILY 90 tablet 3  . warfarin (COUMADIN) 5 MG tablet Take as directed by [facility] 35 tablet 3   No current facility-administered medications for this visit.      Past Medical History:  Diagnosis Date  . AICD (automatic cardioverter/defibrillator) present   . Atrial fibrillation, chronic   . CAD (coronary artery disease)    a. s/p CABG x 3 03/2013 (L-LAD, S-RI, S-PDA) in [place]  . CAP (community acquired pneumonia) 08/22/2015  . CHF (congestive heart failure) (HCC)   . Chronic combined systolic and diastolic heart failure (HCC) November 2014  . Coronary artery disease   . HLD (hyperlipidemia)    . Hyperbilirubinemia   . Ischemic cardiomyopathy    a. echo (10/15):  EF 20%, diff HK, apical AK, mobile density inf wall near papillary muscle (?ruptured) with mod MR, mod reduced RVSF, mild PI, PASP 37 mmHg b. s/p CRTD    . LBBB (left bundle branch block)    . LV (left ventricular) mural thrombus     a. TEE (10/15):  EF 15-20%, septum and ant walls AK, lat wall HK, no papillary muscle rupture, probable small thrombus on ant/ant-septal wall, mild MR, mod decreased RVSF, neg bubble study  . Mitral regurgitation     mild by TEE 02/2014  . NSVT (nonsustained ventricular tachycardia) (HCC)        . Obesity   . Pulmonary hypertension (HCC)   . Umbilical hernia    "have had it since age [age]"    ROS:   All systems reviewed and negative except as noted in the HPI.   Past Surgical History:  Procedure Laterality Date  . BI-VENTRICULAR IMPLANTABLE CARDIOVERTER DEFIBRILLATOR N/A 09/01/2014   MDT CRTD implanted by Dr Taylor  . CARDIAC CATHETERIZATION  03/2013  . CARDIOVERSION N/A 12/04/2017   Procedure: CARDIOVERSION;  Surgeon: Taylor, Gregg W, MD;  Location: [facility] INVASIVE CV LAB;  Service: Cardiovascular;  Laterality: N/A;  . CORONARY ARTERY BYPASS GRAFT  03/2013   LIMA to the LAD, SVG to the ramus, SVG to the PDA  . CORONARY ARTERY BYPASS GRAFT  2014   CABG "X3"  . FRACTURE SURGERY    . INSERT / REPLACE / REMOVE PACEMAKER  2016  . LEAD REVISION/REPAIR N/A 12/04/2017   Procedure: LEAD REVISION/REPAIR;  Surgeon: Taylor, Gregg   W, MD;  Location: [facility] INVASIVE CV LAB;  Service: Cardiovascular;  Laterality: N/A;  . PATELLA FRACTURE SURGERY Left 1972  . TEE WITHOUT CARDIOVERSION N/A 03/04/2014   Procedure: TRANSESOPHAGEAL ECHOCARDIOGRAM (TEE);  Surgeon: Laurey Morale, MD;  Location: [facility] ENDOSCOPY;  Service: Cardiovascular;  Laterality: N/A;  . TEE WITHOUT CARDIOVERSION N/A 12/02/2017   Procedure: TRANSESOPHAGEAL ECHOCARDIOGRAM (TEE);  Surgeon: Jodelle Red, MD;  Location: [facility] ENDOSCOPY;  Service: Cardiovascular;  Laterality: N/A;     Family History  Problem Relation Age of Onset  . Diabetes Mother        Deceased  . Thyroid disease Mother      Social History   Socioeconomic History  . Marital  status: Divorced    Spouse name: Not on file  . Number of children: Not on file  . Years of education: Not on file  . Highest education level: Not on file  Occupational History  . Not on file  Social Needs  . Financial resource strain: Not on file  . Food insecurity:    Worry: Not on file    Inability: Not on file  . Transportation needs:    Medical: Not on file    Non-medical: Not on file  Tobacco Use  . Smoking status: Current Every Day Smoker    Packs/day: 0.12    Years: 47.00    Pack years: 5.64    Types: Cigarettes  . Smokeless tobacco: Never Used  Substance and Sexual Activity  . Alcohol use: Not Currently    Alcohol/week: 0.0 standard drinks    Comment: 12/03/2017 "quit drinking when I was 23"  . Drug use: No    Types: Marijuana    Comment: 12/03/2017 "weekly"  . Sexual activity: Not Currently  Lifestyle  . Physical activity:    Days per week: Not on file    Minutes per session: Not on file  . Stress: Not on file  Relationships  . Social connections:    Talks on phone: Not on file    Gets together: Not on file    Attends religious service: Not on file    Active member of club or organization: Not on file    Attends meetings of clubs or organizations: Not on file    Relationship status: Not on file  . Intimate partner violence:    Fear of current or ex partner: Not on file    Emotionally abused: Not on file    Physically abused: Not on file    Forced sexual activity: Not on file  Other Topics Concern  . Not on file  Social History Narrative   ** Merged History Encounter **         BP 122/80   Pulse (!) 56   Ht 5\' 10"  (1.778 m)   Wt 213 lb (96.6 kg)   SpO2 97%   BMI 30.56 kg/m   Physical Exam:  Well appearing middle aged man, NAD HEENT: Unremarkable Neck:  6 cm JVD, no thyromegally Lymphatics:  No adenopathy Back:  No CVA tenderness Lungs:  Clear with no wheezes HEART:  Regular rate rhythm, no murmurs, no rubs, no clicks Abd:  soft, positive  bowel sounds, no organomegally, no rebound, no guarding Ext:  2 plus pulses, no edema, no cyanosis, no clubbing Skin:  No rashes no nodules Neuro:  CN II through XII intact, motor grossly intact  EKG - nsr with biv pacing  DEVICE  Normal device function.  See PaceArt for details.  Assess/Plan: 1. VT/VF - he has received a successful IC D shock for PMVT/VF. There is a possibility that this is related to dofetilide but I suspect not. We will continue her current meds. 2. RV lead dysfunction - he has developed diaghragmatic stimulation except at very low RV outputs. His RV lead has been present for 4 years and he now has VT/VF. I have recommended we undergo lead extraction and insertion of a new ICD lead. I have reviewed the indications/risks/benefits/goals/expectations and he wishes to proceed. 3. PAF - he is tolerating his dofetilide. He appears to be maintaining NSR.  4. CAD - he is s/p CABG with no symptoms.   Leonia Reeves.D.

## 2018-08-05 NOTE — Patient Instructions (Addendum)
Medication Instructions:  Your physician recommends that you continue on your current medications as directed. Please refer to the Current Medication list given to you today.  Labwork: You will get lab work today:  BMP, CBC and magnesium  Testing/Procedures: Your physician has recommended that you have your leads extracted and replaced.  Follow-Up: You will follow up with device clinic 10-14 days after your procedure for a wound check.  You will follow up with Dr. Ladona Ridgel 91 days after your procedure.    INSTRUCTIONS FOR LEAD EXTRACTION:  Please arrive TO ADMITTING down the hall from the [address] main entrance of [facility] at:  12:00 pm on August 14, 2018  Use the CHG surgical scrub as directed  Do not eat or drink after midnight prior to procedure-you may have small sips of water as needed for dry mouth but try to limit your fluid intake  Do not take your warfarin for 2 days prior to your procedure.  Your last dose will be August 11, 2018 your PM dose.    On the morning of your procedure take ONLY THE following medications:  Take your carvedilol and dofetilide ONLY with a sip of water.  Plan for one night stay  You will need someone to drive you home at discharge  If you need a refill on your cardiac medications before your next appointment, please call your pharmacy.

## 2018-08-05 NOTE — H&P (View-Only) (Signed)
HPI Mr. Camarena returns today for followup. He was sleeping and received an ICD shock. He denies medical non-compliance. He has felt well. No anginal symptoms. He denies palpitations. He was not sure what happended the night he received his ICD shock. He has known RV/ICD lead dysfunction. We tried to place a new lead in August of 2009 but his SCV was occluded.  No Known Allergies   Current Outpatient Medications  Medication Sig Dispense Refill  . acetaminophen (TYLENOL) 325 MG tablet Take 1-2 tablets (325-650 mg total) by mouth every 4 (four) hours as needed for mild pain.    Marland Kitchen atorvastatin (LIPITOR) 40 MG tablet TAKE 1 TABLET BY MOUTH DAILY 90 tablet 3  . carvedilol (COREG) 6.25 MG tablet TAKE 2 TABLETS WITH FOOD TWICE DAILY 360 tablet 3  . dofetilide (TIKOSYN) 250 MCG capsule TAKE 1 CAPSULE BY MOUTH TWICE DAILY 180 capsule 1  . furosemide (LASIX) 40 MG tablet Take 1 tablet (40 mg total) by mouth daily. 90 tablet 2  . Multiple Vitamins-Minerals (HAIR/SKIN/NAILS PO) Take 1 tablet by mouth daily.    Marland Kitchen spironolactone (ALDACTONE) 25 MG tablet TAKE 1 TABLET BY MOUTH DAILY 90 tablet 3  . warfarin (COUMADIN) 5 MG tablet Take as directed by [facility] Clinic 35 tablet 3   No current facility-administered medications for this visit.      Past Medical History:  Diagnosis Date  . AICD (automatic cardioverter/defibrillator) present   . Atrial fibrillation, chronic   . CAD (coronary artery disease)    a. s/p CABG x 3 03/2013 (L-LAD, S-RI, S-PDA) in [place]  . CAP (community acquired pneumonia) 08/22/2015  . CHF (congestive heart failure) (HCC)   . Chronic combined systolic and diastolic heart failure [facility]) November 2014  . Coronary artery disease   . HLD (hyperlipidemia)    . Hyperbilirubinemia   . Ischemic cardiomyopathy    a. echo (10/15):  EF 20%, diff HK, apical AK, mobile density inf wall near papillary muscle (?ruptured) with mod MR, mod reduced RVSF, mild PI, PASP 37 mmHg b. s/p CRTD    . LBBB (left bundle branch block)    . LV (left ventricular) mural thrombus     a. TEE (10/15):  EF 15-20%, septum and ant walls AK, lat wall HK, no papillary muscle rupture, probable small thrombus on ant/ant-septal wall, mild MR, mod decreased RVSF, neg bubble study  . Mitral regurgitation     mild by TEE 02/2014  . NSVT (nonsustained ventricular tachycardia) (HCC)        . Obesity   . Pulmonary hypertension (HCC)   . Umbilical hernia    "have had it since age [age]"    ROS:   All systems reviewed and negative except as noted in the HPI.   Past Surgical History:  Procedure Laterality Date  . BI-VENTRICULAR IMPLANTABLE CARDIOVERTER DEFIBRILLATOR N/A 09/01/2014   MDT CRTD implanted by Dr Ladona Ridgel  . CARDIAC CATHETERIZATION  03/2013  . CARDIOVERSION N/A 12/04/2017   Procedure: CARDIOVERSION;  Surgeon: Marinus Maw, MD;  Location: [facility] INVASIVE CV LAB;  Service: Cardiovascular;  Laterality: N/A;  . CORONARY ARTERY BYPASS GRAFT  03/2013   LIMA to the LAD, SVG to the ramus, SVG to the PDA  . CORONARY ARTERY BYPASS GRAFT  2014   CABG "X3"  . FRACTURE SURGERY    . INSERT / REPLACE / REMOVE PACEMAKER  2016  . LEAD REVISION/REPAIR N/A 12/04/2017   Procedure: LEAD REVISION/REPAIR;  Surgeon: Lewayne Bunting  W, MD;  Location: [facility] INVASIVE CV LAB;  Service: Cardiovascular;  Laterality: N/A;  . PATELLA FRACTURE SURGERY Left 1972  . TEE WITHOUT CARDIOVERSION N/A 03/04/2014   Procedure: TRANSESOPHAGEAL ECHOCARDIOGRAM (TEE);  Surgeon: Laurey Morale, MD;  Location: [facility] ENDOSCOPY;  Service: Cardiovascular;  Laterality: N/A;  . TEE WITHOUT CARDIOVERSION N/A 12/02/2017   Procedure: TRANSESOPHAGEAL ECHOCARDIOGRAM (TEE);  Surgeon: Jodelle Red, MD;  Location: [facility] ENDOSCOPY;  Service: Cardiovascular;  Laterality: N/A;     Family History  Problem Relation Age of Onset  . Diabetes Mother        Deceased  . Thyroid disease Mother      Social History   Socioeconomic History  . Marital  status: Divorced    Spouse name: Not on file  . Number of children: Not on file  . Years of education: Not on file  . Highest education level: Not on file  Occupational History  . Not on file  Social Needs  . Financial resource strain: Not on file  . Food insecurity:    Worry: Not on file    Inability: Not on file  . Transportation needs:    Medical: Not on file    Non-medical: Not on file  Tobacco Use  . Smoking status: Current Every Day Smoker    Packs/day: 0.12    Years: 47.00    Pack years: 5.64    Types: Cigarettes  . Smokeless tobacco: Never Used  Substance and Sexual Activity  . Alcohol use: Not Currently    Alcohol/week: 0.0 standard drinks    Comment: 12/03/2017 "quit drinking when I was 23"  . Drug use: No    Types: Marijuana    Comment: 12/03/2017 "weekly"  . Sexual activity: Not Currently  Lifestyle  . Physical activity:    Days per week: Not on file    Minutes per session: Not on file  . Stress: Not on file  Relationships  . Social connections:    Talks on phone: Not on file    Gets together: Not on file    Attends religious service: Not on file    Active member of club or organization: Not on file    Attends meetings of clubs or organizations: Not on file    Relationship status: Not on file  . Intimate partner violence:    Fear of current or ex partner: Not on file    Emotionally abused: Not on file    Physically abused: Not on file    Forced sexual activity: Not on file  Other Topics Concern  . Not on file  Social History Narrative   ** Merged History Encounter **         BP 122/80   Pulse (!) 56   Ht 5\' 10"  (1.778 m)   Wt 213 lb (96.6 kg)   SpO2 97%   BMI 30.56 kg/m   Physical Exam:  Well appearing middle aged man, NAD HEENT: Unremarkable Neck:  6 cm JVD, no thyromegally Lymphatics:  No adenopathy Back:  No CVA tenderness Lungs:  Clear with no wheezes HEART:  Regular rate rhythm, no murmurs, no rubs, no clicks Abd:  soft, positive  bowel sounds, no organomegally, no rebound, no guarding Ext:  2 plus pulses, no edema, no cyanosis, no clubbing Skin:  No rashes no nodules Neuro:  CN II through XII intact, motor grossly intact  EKG - nsr with biv pacing  DEVICE  Normal device function.  See PaceArt for details.  Assess/Plan: 1. VT/VF - he has received a successful IC D shock for PMVT/VF. There is a possibility that this is related to dofetilide but I suspect not. We will continue her current meds. 2. RV lead dysfunction - he has developed diaghragmatic stimulation except at very low RV outputs. His RV lead has been present for 4 years and he now has VT/VF. I have recommended we undergo lead extraction and insertion of a new ICD lead. I have reviewed the indications/risks/benefits/goals/expectations and he wishes to proceed. 3. PAF - he is tolerating his dofetilide. He appears to be maintaining NSR.  4. CAD - he is s/p CABG with no symptoms.   Leonia Reeves.D.

## 2018-08-07 ENCOUNTER — Other Ambulatory Visit: Payer: Medicare Other

## 2018-08-07 ENCOUNTER — Ambulatory Visit: Payer: Medicare Other

## 2018-08-12 ENCOUNTER — Other Ambulatory Visit: Payer: Self-pay

## 2018-08-12 ENCOUNTER — Encounter (HOSPITAL_COMMUNITY): Payer: Self-pay | Admitting: *Deleted

## 2018-08-12 NOTE — Progress Notes (Signed)
Pt denies SOB and chest pain. Pt stated that he is under the care of Dr. Lewayne Bunting, Cardiology. Pt denies having a stress test. Emailed [employer] reps to make aware of pt surgery, date and time. Pt made aware to stop taking vitamins, fish oil and herbal medications. Do not take any NSAIDs ie: Ibuprofen, Advil, Naproxen (Aleve), Motrin, BC and Goody Powder. Pt denies that both he and his daughter, Misty Stanley (transportation) ,have been in contact with anyone that tested positive for COVID -19. Pt denies that both he and his daughter, Misty Stanley, have been exposed to COVID -19. Pt denies that both he and his daughter, Misty Stanley, have  a cough, cold, congestion, fever >100.4, SOB, sore throat, runny nose, N/V and diarrhea. Pt denies that both he and daughter, Misty Stanley,  traveled outside of [place] in the last 14 days. Pt denies that both he and his daughter, Misty Stanley, traveled internationally in the last 30 days,. Pt made aware of thet no visitor policy. Pt verbalized understanding of all pre-op instructions. PA, Anesthesiology, asked to review pt history.

## 2018-08-12 NOTE — Progress Notes (Signed)
Pt stated that his last dose of Coumadin was Tuesday as instructed by MD.

## 2018-08-13 NOTE — Anesthesia Preprocedure Evaluation (Addendum)
Anesthesia Evaluation  Patient identified by MRN, date of birth, ID band Patient awake    Reviewed: Allergy & Precautions, H&P , NPO status , Patient's Chart, lab work & pertinent test results  Airway Mallampati: II   Neck ROM: full    Dental   Pulmonary Current Smoker,    breath sounds clear to auscultation       Cardiovascular + CAD, + CABG and +CHF  + dysrhythmias + Cardiac Defibrillator  Rhythm:regular Rate:Normal  LBBB. EF 15-20%   Neuro/Psych    GI/Hepatic   Endo/Other    Renal/GU      Musculoskeletal   Abdominal   Peds  Hematology   Anesthesia Other Findings   Reproductive/Obstetrics                            Anesthesia Physical Anesthesia Plan  ASA: IV  Anesthesia Plan: General   Post-op Pain Management:    Induction: Intravenous  PONV Risk Score and Plan: 1 and Ondansetron  Airway Management Planned: Oral ETT  Additional Equipment: Arterial line and TEE  Intra-op Plan:   Post-operative Plan:   Informed Consent: I have reviewed the patients History and Physical, chart, labs and discussed the procedure including the risks, benefits and alternatives for the proposed anesthesia with the patient or authorized representative who has indicated his/her understanding and acceptance.       Plan Discussed with: CRNA and Anesthesiologist  Anesthesia Plan Comments: (Follows with cardiology for CAD s/p CABG 2011, ischemic cardiomyopathy s/p ICD placement, Afib, VT/VF (recently received shock while sleeping).  Admitted 7/16-7/19/19 for tikosyn loading for Afib. TEE showed EF 20-25%. He converted with Tikosyn. Attempted RV lead replacement, but unable to pass through occluded subclavian vein. Per Dr. Lubertha Basque note 08/05/18, "he has developed diaghragmatic stimulation except at very low RV outputs. His RV lead has been present for 4 years and he now has VT/VF. I have recommended  we undergo lead extraction and insertion of a new ICD lead." )       Anesthesia Quick Evaluation

## 2018-08-14 ENCOUNTER — Inpatient Hospital Stay (HOSPITAL_COMMUNITY): Payer: Medicare Other | Admitting: Physician Assistant

## 2018-08-14 ENCOUNTER — Inpatient Hospital Stay (HOSPITAL_COMMUNITY): Payer: Medicare Other

## 2018-08-14 ENCOUNTER — Inpatient Hospital Stay (HOSPITAL_COMMUNITY)
Admission: RE | Admit: 2018-08-14 | Discharge: 2018-08-15 | DRG: 226 | Disposition: A | Discharge status: Home / Self Care | Payer: Medicare Other | Attending: Internal Medicine | Admitting: Internal Medicine

## 2018-08-14 ENCOUNTER — Encounter (HOSPITAL_COMMUNITY): Payer: Self-pay | Admitting: Registered Nurse

## 2018-08-14 ENCOUNTER — Encounter (HOSPITAL_COMMUNITY)
Admission: RE | Disposition: A | Discharge status: Home / Self Care | Payer: Self-pay | Source: Home / Self Care | Attending: Internal Medicine

## 2018-08-14 ENCOUNTER — Other Ambulatory Visit: Payer: Self-pay

## 2018-08-14 DIAGNOSIS — I5042 Chronic combined systolic (congestive) and diastolic (congestive) heart failure: Secondary | ICD-10-CM | POA: Diagnosis not present

## 2018-08-14 DIAGNOSIS — Z6831 Body mass index (BMI) 31.0-31.9, adult: Secondary | ICD-10-CM | POA: Diagnosis not present

## 2018-08-14 DIAGNOSIS — F17210 Nicotine dependence, cigarettes, uncomplicated: Secondary | ICD-10-CM | POA: Diagnosis present

## 2018-08-14 DIAGNOSIS — Z79899 Other long term (current) drug therapy: Secondary | ICD-10-CM

## 2018-08-14 DIAGNOSIS — I255 Ischemic cardiomyopathy: Secondary | ICD-10-CM | POA: Diagnosis present

## 2018-08-14 DIAGNOSIS — I272 Pulmonary hypertension, unspecified: Secondary | ICD-10-CM | POA: Diagnosis not present

## 2018-08-14 DIAGNOSIS — T82198A Other mechanical complication of other cardiac electronic device, initial encounter: Secondary | ICD-10-CM | POA: Diagnosis present

## 2018-08-14 DIAGNOSIS — I472 Ventricular tachycardia: Secondary | ICD-10-CM | POA: Diagnosis present

## 2018-08-14 DIAGNOSIS — Z7901 Long term (current) use of anticoagulants: Secondary | ICD-10-CM | POA: Diagnosis not present

## 2018-08-14 DIAGNOSIS — I2510 Atherosclerotic heart disease of native coronary artery without angina pectoris: Secondary | ICD-10-CM | POA: Diagnosis not present

## 2018-08-14 DIAGNOSIS — Z8349 Family history of other endocrine, nutritional and metabolic diseases: Secondary | ICD-10-CM

## 2018-08-14 DIAGNOSIS — E669 Obesity, unspecified: Secondary | ICD-10-CM | POA: Diagnosis not present

## 2018-08-14 DIAGNOSIS — I480 Paroxysmal atrial fibrillation: Secondary | ICD-10-CM | POA: Diagnosis present

## 2018-08-14 DIAGNOSIS — Z951 Presence of aortocoronary bypass graft: Secondary | ICD-10-CM | POA: Diagnosis not present

## 2018-08-14 DIAGNOSIS — E785 Hyperlipidemia, unspecified: Secondary | ICD-10-CM | POA: Diagnosis present

## 2018-08-14 DIAGNOSIS — Z833 Family history of diabetes mellitus: Secondary | ICD-10-CM | POA: Diagnosis not present

## 2018-08-14 DIAGNOSIS — T82110A Breakdown (mechanical) of cardiac electrode, initial encounter: Secondary | ICD-10-CM | POA: Diagnosis not present

## 2018-08-14 DIAGNOSIS — I4901 Ventricular fibrillation: Secondary | ICD-10-CM | POA: Diagnosis not present

## 2018-08-14 DIAGNOSIS — Y712 Prosthetic and other implants, materials and accessory cardiovascular devices associated with adverse incidents: Secondary | ICD-10-CM | POA: Diagnosis present

## 2018-08-14 DIAGNOSIS — T82191A Other mechanical complication of cardiac pulse generator (battery), initial encounter: Secondary | ICD-10-CM | POA: Diagnosis not present

## 2018-08-14 DIAGNOSIS — I340 Nonrheumatic mitral (valve) insufficiency: Secondary | ICD-10-CM | POA: Diagnosis not present

## 2018-08-14 DIAGNOSIS — I482 Chronic atrial fibrillation, unspecified: Secondary | ICD-10-CM | POA: Diagnosis present

## 2018-08-14 HISTORY — PX: LEAD INSERTION: EP1212

## 2018-08-14 HISTORY — DX: Presence of spectacles and contact lenses: Z97.3

## 2018-08-14 HISTORY — DX: Presence of dental prosthetic device (complete) (partial): K08.109

## 2018-08-14 HISTORY — PX: ICD LEAD REMOVAL: SHX5855

## 2018-08-14 HISTORY — DX: Presence of dental prosthetic device (complete) (partial): Z97.2

## 2018-08-14 LAB — BPAM RBC
Blood Product Expiration Date: 202004132359
Blood Product Expiration Date: 202004132359
ISSUE DATE / TIME: 202003271344
ISSUE DATE / TIME: 202003271344
Unit Type and Rh: 7300
Unit Type and Rh: 7300

## 2018-08-14 LAB — POCT I-STAT 7, (LYTES, BLD GAS, ICA,H+H)
Acid-base deficit: 1 mmol/L (ref 0.0–2.0)
Bicarbonate: 24.8 mmol/L (ref 20.0–28.0)
Calcium, Ion: 1.17 mmol/L (ref 1.15–1.40)
HCT: 40 % (ref 39.0–52.0)
Hemoglobin: 13.6 g/dL (ref 13.0–17.0)
O2 Saturation: 99 %
PCO2 ART: 42.8 mmHg (ref 32.0–48.0)
Potassium: 3.7 mmol/L (ref 3.5–5.1)
Sodium: 139 mmol/L (ref 135–145)
TCO2: 26 mmol/L (ref 22–32)
pH, Arterial: 7.365 (ref 7.350–7.450)
pO2, Arterial: 143 mmHg — ABNORMAL HIGH (ref 83.0–108.0)

## 2018-08-14 LAB — TYPE AND SCREEN
ABO/RH(D): B NEG
Antibody Screen: NEGATIVE
Unit division: 0
Unit division: 0

## 2018-08-14 LAB — ABO/RH: ABO/RH(D): B NEG

## 2018-08-14 LAB — SURGICAL PCR SCREEN
MRSA, PCR: NEGATIVE
Staphylococcus aureus: NEGATIVE

## 2018-08-14 LAB — PREPARE RBC (CROSSMATCH)

## 2018-08-14 LAB — ECHO INTRAOPERATIVE TEE

## 2018-08-14 SURGERY — REMOVAL, ELECTRODE LEAD, ICD
Anesthesia: General | Site: Chest

## 2018-08-14 MED ORDER — FUROSEMIDE 40 MG PO TABS
40.0000 mg | ORAL_TABLET | Freq: Every day | ORAL | Status: DC
  Administered 2018-08-14 – 2018-08-15 (×2): 40 mg via ORAL
  Filled 2018-08-14 (×2): qty 1

## 2018-08-14 MED ORDER — MUPIROCIN 2 % EX OINT
TOPICAL_OINTMENT | CUTANEOUS | Status: AC
  Administered 2018-08-14: 1 via TOPICAL
  Filled 2018-08-14: qty 22

## 2018-08-14 MED ORDER — SUCCINYLCHOLINE CHLORIDE 200 MG/10ML IV SOSY
PREFILLED_SYRINGE | INTRAVENOUS | Status: DC | PRN
  Administered 2018-08-14: 100 mg via INTRAVENOUS

## 2018-08-14 MED ORDER — OXYCODONE HCL 5 MG/5ML PO SOLN: 5.0000 mg | Freq: Once | ORAL | Status: DC | PRN

## 2018-08-14 MED ORDER — DEXAMETHASONE SODIUM PHOSPHATE 10 MG/ML IJ SOLN
INTRAMUSCULAR | Status: DC | PRN
  Administered 2018-08-14: 10 mg via INTRAVENOUS

## 2018-08-14 MED ORDER — FENTANYL CITRATE (PF) 100 MCG/2ML IJ SOLN: 25.0000 ug | INTRAMUSCULAR | Status: DC | PRN

## 2018-08-14 MED ORDER — SODIUM CHLORIDE 0.9 % IV SOLN
INTRAVENOUS | Status: AC
  Filled 2018-08-14: qty 1.2

## 2018-08-14 MED ORDER — SUGAMMADEX SODIUM 200 MG/2ML IV SOLN
INTRAVENOUS | Status: DC | PRN
  Administered 2018-08-14: 100 mg via INTRAVENOUS
  Administered 2018-08-14: 200 mg via INTRAVENOUS

## 2018-08-14 MED ORDER — SODIUM CHLORIDE 0.9 % IV SOLN
INTRAVENOUS | Status: DC
  Administered 2018-08-14 (×2): via INTRAVENOUS

## 2018-08-14 MED ORDER — SODIUM CHLORIDE 0.9 % IV SOLN
INTRAVENOUS | Status: DC
  Administered 2018-08-14 (×3): via INTRAVENOUS

## 2018-08-14 MED ORDER — ROCURONIUM BROMIDE 10 MG/ML (PF) SYRINGE
PREFILLED_SYRINGE | INTRAVENOUS | Status: DC | PRN
  Administered 2018-08-14: 30 mg via INTRAVENOUS
  Administered 2018-08-14: 50 mg via INTRAVENOUS
  Administered 2018-08-14: 20 mg via INTRAVENOUS

## 2018-08-14 MED ORDER — CHLORHEXIDINE GLUCONATE 4 % EX LIQD: 60.0000 mL | Freq: Once | CUTANEOUS | Status: DC

## 2018-08-14 MED ORDER — ONDANSETRON HCL 4 MG/2ML IJ SOLN: 4.0000 mg | Freq: Four times a day (QID) | INTRAMUSCULAR | Status: DC | PRN

## 2018-08-14 MED ORDER — FENTANYL CITRATE (PF) 250 MCG/5ML IJ SOLN
INTRAMUSCULAR | Status: AC
  Filled 2018-08-14: qty 5

## 2018-08-14 MED ORDER — ETOMIDATE 2 MG/ML IV SOLN
INTRAVENOUS | Status: DC | PRN
  Administered 2018-08-14: 20 mg via INTRAVENOUS

## 2018-08-14 MED ORDER — ATORVASTATIN CALCIUM 40 MG PO TABS
40.0000 mg | ORAL_TABLET | Freq: Every day | ORAL | Status: DC
  Administered 2018-08-14 – 2018-08-15 (×2): 40 mg via ORAL
  Filled 2018-08-14 (×2): qty 1

## 2018-08-14 MED ORDER — CEFAZOLIN SODIUM-DEXTROSE 2-4 GM/100ML-% IV SOLN
INTRAVENOUS | Status: AC
  Filled 2018-08-14: qty 100

## 2018-08-14 MED ORDER — ACETAMINOPHEN 325 MG PO TABS: 325.0000 mg | ORAL_TABLET | ORAL | Status: DC | PRN

## 2018-08-14 MED ORDER — EPHEDRINE SULFATE-NACL 50-0.9 MG/10ML-% IV SOSY
PREFILLED_SYRINGE | INTRAVENOUS | Status: DC | PRN
  Administered 2018-08-14: 10 mg via INTRAVENOUS
  Administered 2018-08-14 (×2): 5 mg via INTRAVENOUS

## 2018-08-14 MED ORDER — NOREPINEPHRINE BITARTRATE 1 MG/ML IV SOLN
INTRAVENOUS | Status: DC | PRN
  Administered 2018-08-14: 2 ug/min via INTRAVENOUS

## 2018-08-14 MED ORDER — LIDOCAINE 2% (20 MG/ML) 5 ML SYRINGE
INTRAMUSCULAR | Status: DC | PRN
  Administered 2018-08-14: 80 mg via INTRAVENOUS
  Administered 2018-08-14: 100 mg via INTRAVENOUS

## 2018-08-14 MED ORDER — LACTATED RINGERS IV SOLN
INTRAVENOUS | Status: DC | PRN
  Administered 2018-08-14: 16:00:00 via INTRAVENOUS

## 2018-08-14 MED ORDER — LIDOCAINE HCL (PF) 1 % IJ SOLN
INTRAMUSCULAR | Status: AC
  Filled 2018-08-14: qty 30

## 2018-08-14 MED ORDER — CARVEDILOL 12.5 MG PO TABS
12.5000 mg | ORAL_TABLET | Freq: Two times a day (BID) | ORAL | Status: DC
  Administered 2018-08-15: 12.5 mg via ORAL
  Filled 2018-08-14: qty 1

## 2018-08-14 MED ORDER — ALBUTEROL SULFATE HFA 108 (90 BASE) MCG/ACT IN AERS
INHALATION_SPRAY | RESPIRATORY_TRACT | Status: DC | PRN
  Administered 2018-08-14 (×2): 4 via RESPIRATORY_TRACT

## 2018-08-14 MED ORDER — SODIUM CHLORIDE 0.9% IV SOLUTION: Freq: Once | INTRAVENOUS | Status: DC

## 2018-08-14 MED ORDER — ONDANSETRON HCL 4 MG/2ML IJ SOLN
INTRAMUSCULAR | Status: DC | PRN
  Administered 2018-08-14: 4 mg via INTRAVENOUS

## 2018-08-14 MED ORDER — FENTANYL CITRATE (PF) 250 MCG/5ML IJ SOLN
INTRAMUSCULAR | Status: DC | PRN
  Administered 2018-08-14: 100 ug via INTRAVENOUS
  Administered 2018-08-14: 50 ug via INTRAVENOUS

## 2018-08-14 MED ORDER — ALBUTEROL SULFATE HFA 108 (90 BASE) MCG/ACT IN AERS
INHALATION_SPRAY | RESPIRATORY_TRACT | Status: AC
  Filled 2018-08-14: qty 6.7

## 2018-08-14 MED ORDER — CEFAZOLIN SODIUM-DEXTROSE 2-4 GM/100ML-% IV SOLN
2.0000 g | INTRAVENOUS | Status: AC
  Administered 2018-08-14: 2 g via INTRAVENOUS

## 2018-08-14 MED ORDER — SODIUM CHLORIDE 0.9 % IV SOLN
INTRAVENOUS | Status: DC | PRN
  Administered 2018-08-14: 500 mL

## 2018-08-14 MED ORDER — SODIUM CHLORIDE 0.9 % IV SOLN
80.0000 mg | INTRAVENOUS | Status: AC
  Administered 2018-08-14: 80 mg
  Filled 2018-08-14: qty 2

## 2018-08-14 MED ORDER — MIDAZOLAM HCL 2 MG/2ML IJ SOLN
INTRAMUSCULAR | Status: AC
  Filled 2018-08-14: qty 2

## 2018-08-14 MED ORDER — SPIRONOLACTONE 25 MG PO TABS
25.0000 mg | ORAL_TABLET | Freq: Every day | ORAL | Status: DC
  Administered 2018-08-14 – 2018-08-15 (×2): 25 mg via ORAL
  Filled 2018-08-14 (×2): qty 1

## 2018-08-14 MED ORDER — MUPIROCIN 2 % EX OINT
1.0000 "application " | TOPICAL_OINTMENT | Freq: Once | CUTANEOUS | Status: AC
  Administered 2018-08-14: 1 via TOPICAL

## 2018-08-14 MED ORDER — ALBUMIN HUMAN 5 % IV SOLN
INTRAVENOUS | Status: DC | PRN
  Administered 2018-08-14: 16:00:00 via INTRAVENOUS

## 2018-08-14 MED ORDER — OXYCODONE HCL 5 MG PO TABS: 5.0000 mg | ORAL_TABLET | Freq: Once | ORAL | Status: DC | PRN

## 2018-08-14 MED ORDER — CEFAZOLIN SODIUM-DEXTROSE 1-4 GM/50ML-% IV SOLN
1.0000 g | Freq: Four times a day (QID) | INTRAVENOUS | Status: AC
  Administered 2018-08-14 – 2018-08-15 (×3): 1 g via INTRAVENOUS
  Filled 2018-08-14 (×3): qty 50

## 2018-08-14 MED ORDER — MIDAZOLAM HCL 5 MG/5ML IJ SOLN
INTRAMUSCULAR | Status: DC | PRN
  Administered 2018-08-14: 1 mg via INTRAVENOUS

## 2018-08-14 MED ORDER — DOFETILIDE 250 MCG PO CAPS
250.0000 ug | ORAL_CAPSULE | Freq: Two times a day (BID) | ORAL | Status: DC
  Administered 2018-08-14 – 2018-08-15 (×2): 250 ug via ORAL
  Filled 2018-08-14 (×2): qty 1

## 2018-08-14 SURGICAL SUPPLY — 59 items
BAG BANDED W/RUBBER/TAPE 36X54 (MISCELLANEOUS) IMPLANT
BAG DECANTER FOR FLEXI CONT (MISCELLANEOUS) IMPLANT
BLADE 10 SAFETY STRL DISP (BLADE) ×4 IMPLANT
BLADE CLIPPER SURG (BLADE) IMPLANT
BLADE OSCILLATING /SAGITTAL (BLADE) IMPLANT
BLADE STERNUM SYSTEM 6 (BLADE) IMPLANT
BNDG COHESIVE 4X5 WHT NS (GAUZE/BANDAGES/DRESSINGS) ×4 IMPLANT
CANISTER SUCT 3000ML PPV (MISCELLANEOUS) ×4 IMPLANT
COVER BACK TABLE 60X90IN (DRAPES) ×4 IMPLANT
COVER DOME SNAP 22 D (MISCELLANEOUS) ×4 IMPLANT
COVER SURGICAL LIGHT HANDLE (MISCELLANEOUS) ×4 IMPLANT
COVER WAND RF STERILE (DRAPES) ×4 IMPLANT
DEVICE LCKNG LEAD CARDIAC (CATHETERS) ×2 IMPLANT
DRAPE C-ARM 42X72 X-RAY (DRAPES) IMPLANT
DRAPE CARDIOVASCULAR INCISE (DRAPES) ×2
DRAPE HALF SHEET 40X57 (DRAPES) ×8 IMPLANT
DRAPE INCISE IOBAN 66X45 STRL (DRAPES) IMPLANT
DRAPE SRG 135X102X78XABS (DRAPES) ×2 IMPLANT
DRSG TEGADERM 4X4.75 (GAUZE/BANDAGES/DRESSINGS) ×8 IMPLANT
ELECT REM PT RETURN 9FT ADLT (ELECTROSURGICAL) ×8
ELECTRODE REM PT RTRN 9FT ADLT (ELECTROSURGICAL) ×4 IMPLANT
FELT TEFLON 1X6 (MISCELLANEOUS) IMPLANT
GAUZE 4X4 16PLY RFD (DISPOSABLE) ×12 IMPLANT
GAUZE SPONGE 4X4 12PLY STRL (GAUZE/BANDAGES/DRESSINGS) ×8 IMPLANT
GLOVE BIOGEL PI IND STRL 7.5 (GLOVE) ×2 IMPLANT
GLOVE BIOGEL PI INDICATOR 7.5 (GLOVE) ×2
GLOVE ECLIPSE 8.0 STRL XLNG CF (GLOVE) ×4 IMPLANT
GOWN STRL REUS W/ TWL LRG LVL3 (GOWN DISPOSABLE) ×2 IMPLANT
GOWN STRL REUS W/ TWL XL LVL3 (GOWN DISPOSABLE) ×2 IMPLANT
GOWN STRL REUS W/TWL LRG LVL3 (GOWN DISPOSABLE) ×2
GOWN STRL REUS W/TWL XL LVL3 (GOWN DISPOSABLE) ×2
ICD CLARIA MRI DTMA1Q1 (ICD Generator) ×4 IMPLANT
KIT LEAD ACCESSORY 6056M PINCH (KITS) ×4 IMPLANT
KIT TURNOVER KIT B (KITS) ×4 IMPLANT
LEAD SPRINT QUAT SEC 6935-65CM (Lead) ×4 IMPLANT
NEEDLE PERC 18GX7CM (NEEDLE) ×4 IMPLANT
NS IRRIG 1000ML POUR BTL (IV SOLUTION) IMPLANT
PACEMAKER STYLET GRAY (MISCELLANEOUS) IMPLANT
PACEMAKER STYLET GRAY 58 (MISCELLANEOUS) IMPLANT
PAD ARMBOARD 7.5X6 YLW CONV (MISCELLANEOUS) ×8 IMPLANT
PAD ELECT DEFIB RADIOL ZOLL (MISCELLANEOUS) ×4 IMPLANT
REMOVAL LLD CARDIAC LEAD EZ (CATHETERS) ×4
SHEATH 11 SUB-C ROTATE DILATOR (SHEATH) ×4 IMPLANT
SHEATH EVOLUTION RL 11F (SHEATH) ×4 IMPLANT
SLING ARM FOAM STRAP LRG (SOFTGOODS) ×4 IMPLANT
SUT PROLENE 2 0 CT2 30 (SUTURE) IMPLANT
SUT PROLENE 2 0 SH DA (SUTURE) IMPLANT
SUT VIC AB 2-0 CT2 18 VCP726D (SUTURE) IMPLANT
SUT VIC AB 3-0 X1 27 (SUTURE) IMPLANT
SYRINGE 20CC LL (MISCELLANEOUS) ×8 IMPLANT
TAPE CLOTH SURG 4X10 WHT LF (GAUZE/BANDAGES/DRESSINGS) ×4 IMPLANT
TOWEL OR 17X24 6PK STRL BLUE (TOWEL DISPOSABLE) ×8 IMPLANT
TOWEL OR 17X26 10 PK STRL BLUE (TOWEL DISPOSABLE) ×8 IMPLANT
TRAY FOLEY MTR SLVR 16FR STAT (SET/KITS/TRAYS/PACK) ×4 IMPLANT
TUBE CONNECTING 12'X1/4 (SUCTIONS) ×1
TUBE CONNECTING 12X1/4 (SUCTIONS) ×3 IMPLANT
TUBING EXTENTION W/L.L. (IV SETS) ×4 IMPLANT
WIRE BENTSON .035X145CM (WIRE) ×4 IMPLANT
YANKAUER SUCT BULB TIP NO VENT (SUCTIONS) ×4 IMPLANT

## 2018-08-14 NOTE — Progress Notes (Signed)
  Echocardiogram Echocardiogram Transesophageal has been performed.  [name] 08/14/2018, 2:56 PM

## 2018-08-14 NOTE — Anesthesia Procedure Notes (Signed)
Procedure Name: Intubation Date/Time: 08/14/2018 2:20 PM Performed by: Jearld Pies, CRNA Pre-anesthesia Checklist: Patient identified, Emergency Drugs available, Suction available and Patient being monitored Patient Re-evaluated:Patient Re-evaluated prior to induction Oxygen Delivery Method: Circle System Utilized Preoxygenation: Pre-oxygenation with 100% oxygen Induction Type: IV induction, Rapid sequence and Cricoid Pressure applied Laryngoscope Size: Mac and 4 Grade View: Grade I Tube type: Oral Tube size: 7.5 mm Number of attempts: 1 Airway Equipment and Method: Stylet and Oral airway Placement Confirmation: ETT inserted through vocal cords under direct vision,  positive ETCO2 and breath sounds checked- equal and bilateral Secured at: 21 cm Tube secured with: Tape Dental Injury: Teeth and Oropharynx as per pre-operative assessment

## 2018-08-14 NOTE — Transfer of Care (Signed)
Immediate Anesthesia Transfer of Care Note  Patient: Joseph Carson  Procedure(s) Performed: ICD LEAD REMOVAL (N/A Chest) LEAD INSERTION (N/A )  Patient Location: PACU  Anesthesia Type:General  Level of Consciousness: awake, alert  and oriented  Airway & Oxygen Therapy: Patient Spontanous Breathing and Patient connected to face mask oxygen  Post-op Assessment: Report given to RN and Post -op Vital signs reviewed and stable  Post vital signs: Reviewed and stable  Last Vitals:  Vitals Value Taken Time  BP 132/74 08/14/2018  4:51 PM  Temp    Pulse 61 08/14/2018  4:56 PM  Resp 17 08/14/2018  4:56 PM  SpO2 99 % 08/14/2018  4:56 PM  Vitals shown include unvalidated device data.  Last Pain:  Vitals:   08/14/18 1151  TempSrc: Oral     Report to [facility] in PACU. Pacer ON via DDD (60 - 130) and ICD ON. Matches Epic documentation. VSS.   Patients Stated Pain Goal: 4 (08/14/18 1207)  Complications: No apparent anesthesia complications

## 2018-08-14 NOTE — Anesthesia Procedure Notes (Signed)
Arterial Line Insertion Start/End3/27/2020 1:35 PM, 08/14/2018 1:40 PM Performed by: Rachel Moulds, CRNA, CRNA  Patient location: Pre-op. Preanesthetic checklist: patient identified, IV checked, site marked, risks and benefits discussed, surgical consent, monitors and equipment checked, pre-op evaluation, timeout performed and anesthesia consent Lidocaine 1% used for infiltration Right, radial was placed Catheter size: 20 G Hand hygiene performed  and maximum sterile barriers used  Allen's test indicative of satisfactory collateral circulation Attempts: 2 Procedure performed without using ultrasound guided technique. Following insertion, Biopatch and dressing applied. Post procedure assessment: normal  Patient tolerated the procedure well with no immediate complications.

## 2018-08-14 NOTE — Op Note (Signed)
EP procedure note  Procedure performed: ICD lead extraction with insertion of a new ICD lead, and removal of the previously implanted ICD generator which had less than 2 years before reaching elective replacement with insertion of a new ICD generator with defibrillation threshold testing  Preoperative diagnosis: ICD implantation status post ventricular fibrillation with diaphragmatic stimulation on the RV lead in a patient with an ischemic cardiomyopathy and chronic systolic heart failure and complete heart block  Postoperative diagnosis: Same as preoperative diagnosis  Description of the procedure: After informed consent was obtained, the patient was taken to the operating room in the fasting state.  The anesthesia service was utilized to provide general endotracheal anesthesia as well as invasive hemodynamic monitoring.  After the usual preparation and draping and appropriate timeouts were obtained, a 6 French sheath was inserted percutaneously into the right femoral vein for central venous access.  Attention was then turned to the defibrillator.  A 5 cm incision was carried out over the ICD insertion site and electrocautery and blunt dissection utilized to dissect down to the ICD generator which was removed with gentle traction.  The defibrillation lead was disconnected from the defibrillator and the dense fibrous scar tissue was freed up with electrocautery.  A stylette was inserted down the body of the lead and the helix retracted.  The lead was then cut.  A [employer] locking stylette was advanced to the tip of the lead and locked in place.  A [employer] 11 French short sub-C dissection sheath was advanced under fluoroscopic guidance over the scar tissue surrounding the defibrillator lead down to the junction between the innominate vein and the left superior vena cava.  The lead remained fixed in place.  The [facility] 11 French RL extraction sheath was then advanced over the defibrillation lead down  to the right atrium.  The entire defibrillator lead was removed with gentle traction.  The outer extraction sheath was left in place and a long 035 wire was advanced through the sheath and into the central circulation.  At this point a long 9 French peel-away sheath was advanced over the wire and into the central circulation.  The Medtronic active-fixation single coil 65 cm defibrillator lead, serial number [id number] was advanced under fluoroscopic guidance into the right ventricle.  It should be noted that insertion of the guidewire resulted in the development of ventricular fibrillation for which the patient was defibrillated.  Mapping of the defibrillator lead in the right ventricle was carried out and at the final site the R waves measured 9 mV.  With the lead actively fixed the pacing impedance was 583 ohms.  The pacing threshold was a volt at 0.4 ms.  With the lead in satisfactory position the peel away sheath was removed.  The leads were secured to the fascia with silk suture.  The sewing sleeve was secured with silk suture.  The pocket was irrigated with antibiotic irrigation.  The new Medtronic biventricular ICD, serial number [id number], was connected to the new ICD lead and the old LV and right atrial leads and placed back in the subcutaneous pocket.  The pocket was irrigated with antibiotic irrigation.  The incision was closed with 2 layers of Vicryl suture.  Defibrillation threshold testing was then carried out.  Ventricular fibrillation was induced with a T wave shock.  A 20 J shock was then delivered terminated ventricular fibrillation and restoring sinus rhythm.  No additional defibrillation threshold testing was carried out.  The shocking impedance was 78 ohms.  The  duration was 10 seconds.  Benzoin and Steri-Strips were painted on the skin a bandage was placed a pressure dressing applied and the patient returned to his room in stable condition.  Complications: There were no immediate procedure  complications  Conclusion: Successful extraction of an old active fixation defibrillation lead with insertion of a new ICD lead in a patient with ICD lead malfunction secondary to diaphragmatic stimulation, and successful removal of a previously implanted by V ICD which was close to elective replacement and insertion of a new Bi V ICD in a patient with chronic systolic heart failure, and ischemic cardiomyopathy, EF 20%, and ventricular tachycardia/fibrillation.  Lewayne Bunting, MD

## 2018-08-14 NOTE — Interval H&P Note (Signed)
History and Physical Interval Note:  08/14/2018 1:15 PM  Joseph Carson  has presented today for surgery, with the diagnosis of lead malfunction.  The various methods of treatment have been discussed with the patient and family. After consideration of risks, benefits and other options for treatment, the patient has consented to  Procedure(s): ICD LEAD REMOVAL (N/A) LEAD INSERTION (N/A) as a surgical intervention.  The patient's history has been reviewed, patient examined, no change in status, stable for surgery.  I have reviewed the patient's chart and labs.  Questions were answered to the patient's satisfaction.     Lewayne Bunting

## 2018-08-15 NOTE — Progress Notes (Signed)
Doing well s/p lead revision for RV lead failure No complaints this am VSS  Dr Ladona Ridgel did not order CXR or device interrogation due to COVID 19 concerns.  DC to home Will need 10 day wound check and follow-up with Dr Ladona Ridgel in 3 months  resume home medicines at discharge.  Hillis Range MD, [facility] 08/15/2018 10:37 AM

## 2018-08-15 NOTE — Discharge Instructions (Signed)
° ° ° °  ONCE HOME, PLEASE SEND A REMOTE DEVICE TRANSMISSION  and  PLEASE SIGN UP TO YOUR MY CHART ACCOUNT WHEN YOU GET HOME (if you aren't already).  IN THE CURRENT ENVIRONMENT WITH COVID-19, IN EFFORT TO REDUCE YOUR EXPOSURE WE WILL BE CONDUCTING MANY PATIENT VISITS BY EITHER ELECTRONIC/VIRTUAL VISITS or TELEPHONE VISITS.  BEING SIGNED UP IN YOUR MY CHART ACCOUNT  WILL HELP FACILITATE THESE VISITS AND OUR COMMUNICATION WITH YOU    Supplemental Discharge Instructions for  Pacemaker/Defibrillator Patients  Activity No heavy lifting or vigorous activity with your left/right arm for 6 to 8 weeks.  Do not raise your left/right arm above your head for one week.  Gradually raise your affected arm as drawn below.              08/18/2018                 08/19/2018                   08/20/2018                08/21/2018 __  NO DRIVING for  1 week   ; you may begin driving on  3/0/8657   .  WOUND CARE - Keep the wound area clean and dry.  Do not get this area wet for one week. No showers for one week; you may shower on  08/21/2018   . - The tape/steri-strips on your wound will fall off; do not pull them off.  No bandage is needed on the site.  DO  NOT apply any creams, oils, or ointments to the wound area. - If you notice any drainage or discharge from the wound, any swelling or bruising at the site, or you develop a fever > 101? F after you are discharged home, call the office at once.  Special Instructions - You are still able to use cellular telephones; use the ear opposite the side where you have your pacemaker/defibrillator.  Avoid carrying your cellular phone near your device. - When traveling through airports, show security personnel your identification card to avoid being screened in the metal detectors.  Ask the security personnel to use the hand wand. - Avoid arc welding equipment, MRI testing (magnetic resonance imaging), TENS units (transcutaneous nerve stimulators).  Call the office for  questions about other devices. - Avoid electrical appliances that are in poor condition or are not properly grounded. - Microwave ovens are safe to be near or to operate.  Additional information for defibrillator patients should your device go off: - If your device goes off ONCE and you feel fine afterward, notify the device clinic nurses. - If your device goes off ONCE and you do not feel well afterward, call 911. - If your device goes off TWICE, call 911. - If your device goes off THREE times in one day, call 911.  DO NOT DRIVE YOURSELF OR A FAMILY MEMBER WITH A DEFIBRILLATOR TO THE HOSPITAL--CALL 911.

## 2018-08-15 NOTE — Discharge Summary (Signed)
Discharge Summary    Patient ID: Joseph Carson,  MRN: [id number], DOB/AGE: 1955-11-23 63 y.o.  Admit date: 08/14/2018 Discharge date: 08/15/2018  Primary Care Provider: Patient, No Pcp Per Primary Cardiologist: No primary care provider on file. Electrophysiologist: Lewayne Bunting, MD Advanced Heart Failure: Marca Ancona, MD   Discharge Diagnoses    Active Problems:   Malfunction of implantable defibrillator ventricular (ICD) lead   Allergies No Known Allergies  Diagnostic Studies/Procedures    EP procedure note with intraoperative TEE: 08/14/2018  Procedure performed: ICD lead extraction with insertion of a new ICD lead, and removal of the previously implanted ICD generator which had less than 2 years before reaching elective replacement with insertion of a new ICD generator with defibrillation threshold testing  Preoperative diagnosis: ICD implantation status post ventricular fibrillation with diaphragmatic stimulation on the RV lead in a patient with an ischemic cardiomyopathy and chronic systolic heart failure and complete heart block  Postoperative diagnosis: Same as preoperative diagnosis  Description of the procedure: After informed consent was obtained, the patient was taken to the operating room in the fasting state.  The anesthesia service was utilized to provide general endotracheal anesthesia as well as invasive hemodynamic monitoring.  After the usual preparation and draping and appropriate timeouts were obtained, a 6 French sheath was inserted percutaneously into the right femoral vein for central venous access.  Attention was then turned to the defibrillator.  A 5 cm incision was carried out over the ICD insertion site and electrocautery and blunt dissection utilized to dissect down to the ICD generator which was removed with gentle traction.  The defibrillation lead was disconnected from the defibrillator and the dense fibrous scar tissue was freed up with  electrocautery.  A stylette was inserted down the body of the lead and the helix retracted.  The lead was then cut.  A [employer] locking stylette was advanced to the tip of the lead and locked in place.  A [employer] 11 French short sub-C dissection sheath was advanced under fluoroscopic guidance over the scar tissue surrounding the defibrillator lead down to the junction between the innominate vein and the left superior vena cava.  The lead remained fixed in place.  The [facility] 11 French RL extraction sheath was then advanced over the defibrillation lead down to the right atrium.  The entire defibrillator lead was removed with gentle traction.  The outer extraction sheath was left in place and a long 035 wire was advanced through the sheath and into the central circulation.  At this point a long 9 French peel-away sheath was advanced over the wire and into the central circulation.  The Medtronic active-fixation single coil 65 cm defibrillator lead, serial number [id number] was advanced under fluoroscopic guidance into the right ventricle.  It should be noted that insertion of the guidewire resulted in the development of ventricular fibrillation for which the patient was defibrillated.  Mapping of the defibrillator lead in the right ventricle was carried out and at the final site the R waves measured 9 mV.  With the lead actively fixed the pacing impedance was 583 ohms.  The pacing threshold was a volt at 0.4 ms.  With the lead in satisfactory position the peel away sheath was removed.  The leads were secured to the fascia with silk suture.  The sewing sleeve was secured with silk suture.  The pocket was irrigated with antibiotic irrigation.  The new Medtronic biventricular ICD, serial number [id number], was connected to the new  ICD lead and the old LV and right atrial leads and placed back in the subcutaneous pocket.  The pocket was irrigated with antibiotic irrigation.  The incision was closed with 2 layers of  Vicryl suture.  Defibrillation threshold testing was then carried out.  Ventricular fibrillation was induced with a T wave shock.  A 20 J shock was then delivered terminated ventricular fibrillation and restoring sinus rhythm.  No additional defibrillation threshold testing was carried out.  The shocking impedance was 78 ohms.  The duration was 10 seconds.  Benzoin and Steri-Strips were painted on the skin a bandage was placed a pressure dressing applied and the patient returned to his room in stable condition.  Complications: There were no immediate procedure complications  Conclusion: Successful extraction of an old active fixation defibrillation lead with insertion of a new ICD lead in a patient with ICD lead malfunction secondary to diaphragmatic stimulation, and successful removal of a previously implanted by V ICD which was close to elective replacement and insertion of a new Bi V ICD in a patient with chronic systolic heart failure, and ischemic cardiomyopathy, EF 20%, and ventricular tachycardia/fibrillation. _____________   History of Present Illness     Patient is a 63 year old male with a history of CABG 2014 in [place], chronic systolic and diastolic CHF, ICM with an EF of approximately 20%, LBBB, s/p CRTD, NSVT, pulm HTN, obesity.  Joseph Carson was seen by Dr. Ladona Ridgel on 3/18 after an ICD shock woke him.  He has known RV/ICD lead dysfunction.  Placement of a new lead in August 2009 was aborted due to SVC occlusion.  It was noted that he had received a successful shock for PMVT/VF.  He was also noted to have RV lead dysfunction and diaphragmatic stimulation except very low RV outputs.  Dr. Ladona Ridgel recommended that he undergo lead extraction and insertion of a new ICD lead.  Hospital Course     Consultants: None   He came to the hospital for the procedure on 08/14/2018.  Did not had an arterial line inserted for the procedure.  He had an intraoperative TEE.  He had ICD lead extraction,  insertion of a new ICD lead and generator change to a  Medtronic biventricular ICD, serial number [id number], as well.  He tolerated the procedure well.  On 08/15/2018, he was evaluated by Dr. Johney Frame and all data were reviewed.  Due to COVID-19 concerns, no chest x-ray or device interrogation was performed.  However, he was not short of breath and volume status was at baseline.  He was pacing most of the time on telemetry review.  Dr. Johney Frame considered him stable for discharge, to follow-up in 10 days with a wound check and with Dr. Ladona Ridgel in 3 months.  _____________  Discharge Vitals Blood pressure 118/73, pulse 65, temperature 98 F (36.7 C), temperature source Oral, resp. rate 17, height  (1.778 m), weight 98.7 kg, SpO2 93 %.  Filed Weights   08/14/18 1835  Weight: 98.7 kg    Labs & Radiologic Studies    CBC Recent Labs    08/14/18 1512  HGB 13.6  HCT 40.0   Basic Metabolic Panel Recent Labs    13/24/40 1512  NA 139  K 3.7   Lab Results  Component Value Date   INR 3.0 07/10/2018   INR 2.4 05/29/2018   INR 2.9 04/24/2018   _____________   Disposition   Pt is being discharged home today in good condition.  Follow-up Plans &  Appointments    Follow-up Information    Marinus Maw, MD Follow up.   Specialty:  Cardiology Why:  11/17/2018 @ 2:45PM Contact information: [address] [place] [address] [phone]        [facility] Follow up.   Specialty:  Cardiology Why:  08/21/2018 @ 8:15AM,  coumadin clinic Contact information: [address] [place] [address] [phone]       [facility] [address] Follow up.   Specialty:  Cardiology Why:  08/25/2018 @ 9:30AM for a device check Contact information: [address], [address] [place] [address] [phone]         Discharge Instructions    (HEART FAILURE PATIENTS) Call MD:  Anytime you  have any of the following symptoms: 1) 3 pound weight gain in 24 hours or 5 pounds in 1 week 2) shortness of breath, with or without a dry hacking cough 3) swelling in the hands, feet or stomach 4) if you have to sleep on extra pillows at night in order to breathe.   Complete by:  As directed    Diet - low sodium heart healthy   Complete by:  As directed    Increase activity slowly   Complete by:  As directed       Discharge Medications   Allergies as of 08/15/2018   No Known Allergies     Medication List    TAKE these medications   acetaminophen 325 MG tablet Commonly known as:  TYLENOL Take 1-2 tablets (325-650 mg total) by mouth every 4 (four) hours as needed for mild pain.   atorvastatin 40 MG tablet Commonly known as:  LIPITOR TAKE 1 TABLET BY MOUTH DAILY   carvedilol 6.25 MG tablet Commonly known as:  COREG TAKE 2 TABLETS WITH FOOD TWICE DAILY What changed:  See the new instructions.   dofetilide 250 MCG capsule Commonly known as:  TIKOSYN TAKE 1 CAPSULE BY MOUTH TWICE DAILY What changed:  how to take this   furosemide 40 MG tablet Commonly known as:  LASIX Take 1 tablet (40 mg total) by mouth daily.   HAIR/SKIN/NAILS PO Take 1 tablet by mouth daily.   spironolactone 25 MG tablet Commonly known as:  ALDACTONE TAKE 1 TABLET BY MOUTH DAILY   warfarin 5 MG tablet Commonly known as:  COUMADIN Take as directed. If you are unsure how to take this medication, talk to your nurse or doctor. Original instructions:  TAKE AS DIRECTED BY COUMADIN CLINIC. What changed:  See the new instructions.        Outstanding Labs/Studies   None  Duration of Discharge Encounter   Greater than 30 minutes including physician time.  Bobbye Riggs Courtenay Creger NP 08/15/2018, 12:55 PM

## 2018-08-15 NOTE — Progress Notes (Signed)
Orthopedic Tech Progress Note Patient Details:  Joseph Carson 04-04-1956 [id number] RN said patient has on arm sling Patient ID: Joseph Carson, male   DOB: 01/28/1956, 63 y.o.   MRN: [id number]   Joseph Carson 08/15/2018, 8:39 AM

## 2018-08-16 NOTE — Anesthesia Postprocedure Evaluation (Signed)
Anesthesia Post Note  Patient: Joseph Carson  Procedure(s) Performed: ICD LEAD REMOVAL (N/A Chest) LEAD INSERTION (N/A )     Patient location during evaluation: PACU Anesthesia Type: General Level of consciousness: awake and alert Pain management: pain level controlled Vital Signs Assessment: post-procedure vital signs reviewed and stable Respiratory status: spontaneous breathing, nonlabored ventilation, respiratory function stable and patient connected to nasal cannula oxygen Cardiovascular status: blood pressure returned to baseline and stable Postop Assessment: no apparent nausea or vomiting Anesthetic complications: no    Last Vitals:  Vitals:   08/15/18 0458 08/15/18 0822  BP: 99/66 118/73  Pulse: 60 65  Resp: 17 17  Temp: 36.7 C 36.7 C  SpO2: 93% 93%    Last Pain:  Vitals:   08/15/18 0822  TempSrc: Oral  PainSc:                  [name]

## 2018-08-17 ENCOUNTER — Encounter (HOSPITAL_COMMUNITY): Payer: Self-pay | Admitting: Internal Medicine

## 2018-08-19 NOTE — H&P (Signed)
The patient is not on an ACE inhibitor or an ARB due to orthostasis.   Joseph Carson.D

## 2018-08-20 ENCOUNTER — Telehealth: Payer: Self-pay

## 2018-08-20 ENCOUNTER — Telehealth: Payer: Self-pay | Admitting: *Deleted

## 2018-08-20 NOTE — Telephone Encounter (Signed)
lmom for prescreen/drive thru 

## 2018-08-20 NOTE — Telephone Encounter (Signed)
-----   Message from Lajoyce Corners, [place] sent at 08/19/2018 11:15 AM EDT ----- Regarding: Virtual Visit I called this patient about setting up a virtual visit, but he has a coumadin check on the 7th. He states that he moved his Coumadin check to the 7th so he could do both visits in one day. What should I do? Reschedule to Monday and move Coumadin check to Monday?

## 2018-08-20 NOTE — Telephone Encounter (Signed)
Spoke with patient to reschedule wound check visit. Patient declines video visit. Advised that Coumadin Clinic visit is being done as a drive-thru visit. He verbalizes understanding. Both appointments will be moved to Monday, 08/24/18, at 9:00am. Pt verbalizes understanding and thanked me for my call.

## 2018-08-20 NOTE — Telephone Encounter (Signed)

## 2018-08-23 ENCOUNTER — Telehealth: Payer: Self-pay | Admitting: *Deleted

## 2018-08-23 NOTE — Telephone Encounter (Signed)
Spoke with patient to reschedule wound check appointment for 08/24/18. Offered E-visit this week or in-office visit on 4/12. Pt reports he will think about what he wants to do and call back during business hours. He is aware to keep his Coumadin Clinic appointment tomorrow and denies questions at this time.

## 2018-08-24 ENCOUNTER — Ambulatory Visit: Payer: Medicare Other

## 2018-08-24 ENCOUNTER — Ambulatory Visit (INDEPENDENT_AMBULATORY_CARE_PROVIDER_SITE_OTHER): Payer: Medicare Other | Admitting: Pharmacist Clinician (PhC)/ Clinical Pharmacy Specialist

## 2018-08-24 ENCOUNTER — Other Ambulatory Visit: Payer: Self-pay

## 2018-08-24 DIAGNOSIS — I4819 Other persistent atrial fibrillation: Secondary | ICD-10-CM

## 2018-08-24 DIAGNOSIS — Z5181 Encounter for therapeutic drug level monitoring: Secondary | ICD-10-CM | POA: Diagnosis not present

## 2018-08-24 LAB — POCT INR: INR: 2.9 (ref 2.0–3.0)

## 2018-08-25 ENCOUNTER — Ambulatory Visit: Payer: Medicare Other

## 2018-08-25 NOTE — Telephone Encounter (Signed)
LMOVM requesting call back to the DC to reschedule wound check. Can offer 4/13 for in-clinic check (okay to double-book in the AM), or any day later this week for virtual OV.

## 2018-08-25 NOTE — Telephone Encounter (Signed)
  Patient is calling to see about setting up visit mentioned in previous note

## 2018-08-26 NOTE — Telephone Encounter (Signed)
Spoke with patient. He is agreeable to wound check appointment via virtual visit on 08/27/18 at 10:00am. He will send a manual transmission in the AM prior to his appointment. He denies questions or concerns at this time.

## 2018-08-27 ENCOUNTER — Telehealth (INDEPENDENT_AMBULATORY_CARE_PROVIDER_SITE_OTHER): Payer: Medicare Other | Admitting: *Deleted

## 2018-08-27 ENCOUNTER — Encounter: Payer: Self-pay | Admitting: *Deleted

## 2018-08-27 ENCOUNTER — Other Ambulatory Visit: Payer: Self-pay

## 2018-08-27 DIAGNOSIS — I255 Ischemic cardiomyopathy: Secondary | ICD-10-CM

## 2018-08-27 DIAGNOSIS — Z95810 Presence of automatic (implantable) cardiac defibrillator: Secondary | ICD-10-CM | POA: Diagnosis not present

## 2018-08-27 DIAGNOSIS — I5022 Chronic systolic (congestive) heart failure: Secondary | ICD-10-CM | POA: Diagnosis not present

## 2018-08-27 LAB — CUP PACEART REMOTE DEVICE CHECK
Battery Remaining Longevity: 65 mo
Battery Voltage: 3.07 V
Brady Statistic AP VP Percent: 69.06 %
Brady Statistic AP VS Percent: 1.12 %
Brady Statistic AS VP Percent: 26.17 %
Brady Statistic AS VS Percent: 3.64 %
Brady Statistic RA Percent Paced: 67.31 %
Brady Statistic RV Percent Paced: 90.08 %
Date Time Interrogation Session: 20200409104222
HighPow Impedance: 79 Ohm
Implantable Lead Implant Date: 20160414
Implantable Lead Implant Date: 20160414
Implantable Lead Implant Date: 20200327
Implantable Lead Location: 753858
Implantable Lead Location: 753859
Implantable Lead Location: 753860
Implantable Lead Model: 4398
Implantable Lead Model: 5076
Implantable Lead Model: 6935
Implantable Pulse Generator Implant Date: 20200327
Lead Channel Impedance Value: 198.837
Lead Channel Impedance Value: 198.837
Lead Channel Impedance Value: 202.091
Lead Channel Impedance Value: 237.5 Ohm
Lead Channel Impedance Value: 242.157
Lead Channel Impedance Value: 342 Ohm
Lead Channel Impedance Value: 361 Ohm
Lead Channel Impedance Value: 437 Ohm
Lead Channel Impedance Value: 475 Ohm
Lead Channel Impedance Value: 475 Ohm
Lead Channel Impedance Value: 475 Ohm
Lead Channel Impedance Value: 494 Ohm
Lead Channel Impedance Value: 494 Ohm
Lead Channel Impedance Value: 703 Ohm
Lead Channel Impedance Value: 703 Ohm
Lead Channel Impedance Value: 722 Ohm
Lead Channel Impedance Value: 798 Ohm
Lead Channel Impedance Value: 798 Ohm
Lead Channel Pacing Threshold Amplitude: 0.625 V
Lead Channel Pacing Threshold Amplitude: 0.75 V
Lead Channel Pacing Threshold Amplitude: 2 V
Lead Channel Pacing Threshold Pulse Width: 0.4 ms
Lead Channel Pacing Threshold Pulse Width: 0.4 ms
Lead Channel Pacing Threshold Pulse Width: 0.8 ms
Lead Channel Sensing Intrinsic Amplitude: 0.875 mV
Lead Channel Sensing Intrinsic Amplitude: 13.375 mV
Lead Channel Setting Pacing Amplitude: 1.5 V
Lead Channel Setting Pacing Amplitude: 3.5 V
Lead Channel Setting Pacing Amplitude: 3.5 V
Lead Channel Setting Pacing Pulse Width: 0.4 ms
Lead Channel Setting Pacing Pulse Width: 0.8 ms
Lead Channel Setting Sensing Sensitivity: 0.3 mV

## 2018-08-27 NOTE — Progress Notes (Signed)
CRT-D wound check via video visit. Steri-strips removed by patient prior to appointment. Wound without redness or edema. Incision edges approximated, wound well healed. Manual transmission sent in for review of device function. Threshold, sensing, and impedance trends stable and consistent with implant measurements. RA and LV outputs at chronic settings; RV output programmed at 3.5V with auto capture on for extra safety margin until 3 month visit. Histogram distribution appropriate for patient and level of activity. BiV pacing 90.1% (effective 86.1%). No mode switches or ventricular arrhythmias noted. Patient educated about wound care, arm mobility, lifting restrictions, shock plan, and Carelink monitor. ROV with GT on 11/17/18.

## 2018-08-27 NOTE — Patient Instructions (Signed)
After Your ICD (Implantable Cardiac Defibrillator)    . Monitor your defibrillator site for redness, swelling, and drainage. Call the device clinic at [phone] if you experience these symptoms or fever/chills.  . If your incision is closed with Steri-strips or staples:  You may shower 7 days after your procedure and wash your incision with soap and water. Avoid lotions, ointments, or perfumes over your incision until it is well-healed.  . You may use a hot tub or a pool after your wound check appointment if the incision is completely closed.  . Do not lift over 10 pounds with the affected arm until 6 weeks after your procedure. There are no other restrictions in arm movement after your wound check appointment.  . Your ICD IS MRI compatible.  . Your ICD is designed to protect you from life threatening heart rhythms. Because of this, you may receive a shock.   o 1 shock with no symptoms:  Call the office during business hours. o 1 shock with symptoms (chest pain, chest pressure, dizziness, lightheadedness, shortness of breath, overall feeling unwell):  Call 911. o If you experience 2 or more shocks in 24 hours:  Call 911. o If you receive a shock, you should not drive.  o [place] DMV - no driving for 6 months if you receive appropriate therapy from your ICD.   . ICD Alerts:  Some alerts are vibratory and others beep. These are NOT emergencies. Please call our office to let us know. If this occurs at night or on weekends, it can wait until the next business day. Send a remote transmission.  . If your device is capable of reading fluid status (for heart failure), you will be offered monthly monitoring to review this with you.   . Remote monitoring is used to monitor your ICD from home. This monitoring is scheduled every 91 days by our office. It allows us to keep an eye on the functioning of your device to ensure it is working properly. You will routinely see your Electrophysiologist annually  (more often if necessary). 

## 2018-10-14 ENCOUNTER — Telehealth: Payer: Self-pay | Admitting: Pharmacist

## 2018-10-14 NOTE — Telephone Encounter (Signed)
Called patient to reschedule for in office coumadin clinic appointment

## 2018-10-19 ENCOUNTER — Telehealth: Payer: Self-pay

## 2018-10-19 NOTE — Telephone Encounter (Signed)

## 2018-10-20 ENCOUNTER — Other Ambulatory Visit: Payer: Self-pay

## 2018-10-20 ENCOUNTER — Ambulatory Visit (INDEPENDENT_AMBULATORY_CARE_PROVIDER_SITE_OTHER): Payer: Medicare Other | Admitting: *Deleted

## 2018-10-20 DIAGNOSIS — Z5181 Encounter for therapeutic drug level monitoring: Secondary | ICD-10-CM | POA: Diagnosis not present

## 2018-10-20 LAB — POCT INR: INR: 2.5 (ref 2.0–3.0)

## 2018-10-20 NOTE — Patient Instructions (Signed)
Description   Continue on same dosage 1 tablet daily except 1/2 tablet on Sundays. Recheck INR in 8 weeks.  Call Coumadin Clinic if placed on any new medications or scheduled for any procedures - [phone].      

## 2018-10-23 ENCOUNTER — Telehealth: Payer: Self-pay | Admitting: Internal Medicine

## 2018-10-23 NOTE — Telephone Encounter (Signed)
New Message   Patient wants to know if he takes his remote when he goes out of town will he still be able to do the home remote transmission for his defib.

## 2018-10-23 NOTE — Telephone Encounter (Signed)
Spoke w/ pt and he stated that he would be gone for 3-4 weeks. Informed pt that the recommendation is that if he is going to be gone for more than 7 days to take the monitor w/ him. Pt verbalized understanding.

## 2018-11-04 IMAGING — CR DG CHEST 2V
2 series · 2 of 2 positions shown · non-contrast
Comparison: 08/22/2015

CLINICAL DATA: Short of breath

EXAM:
CHEST - 2 VIEW

[chest pa]
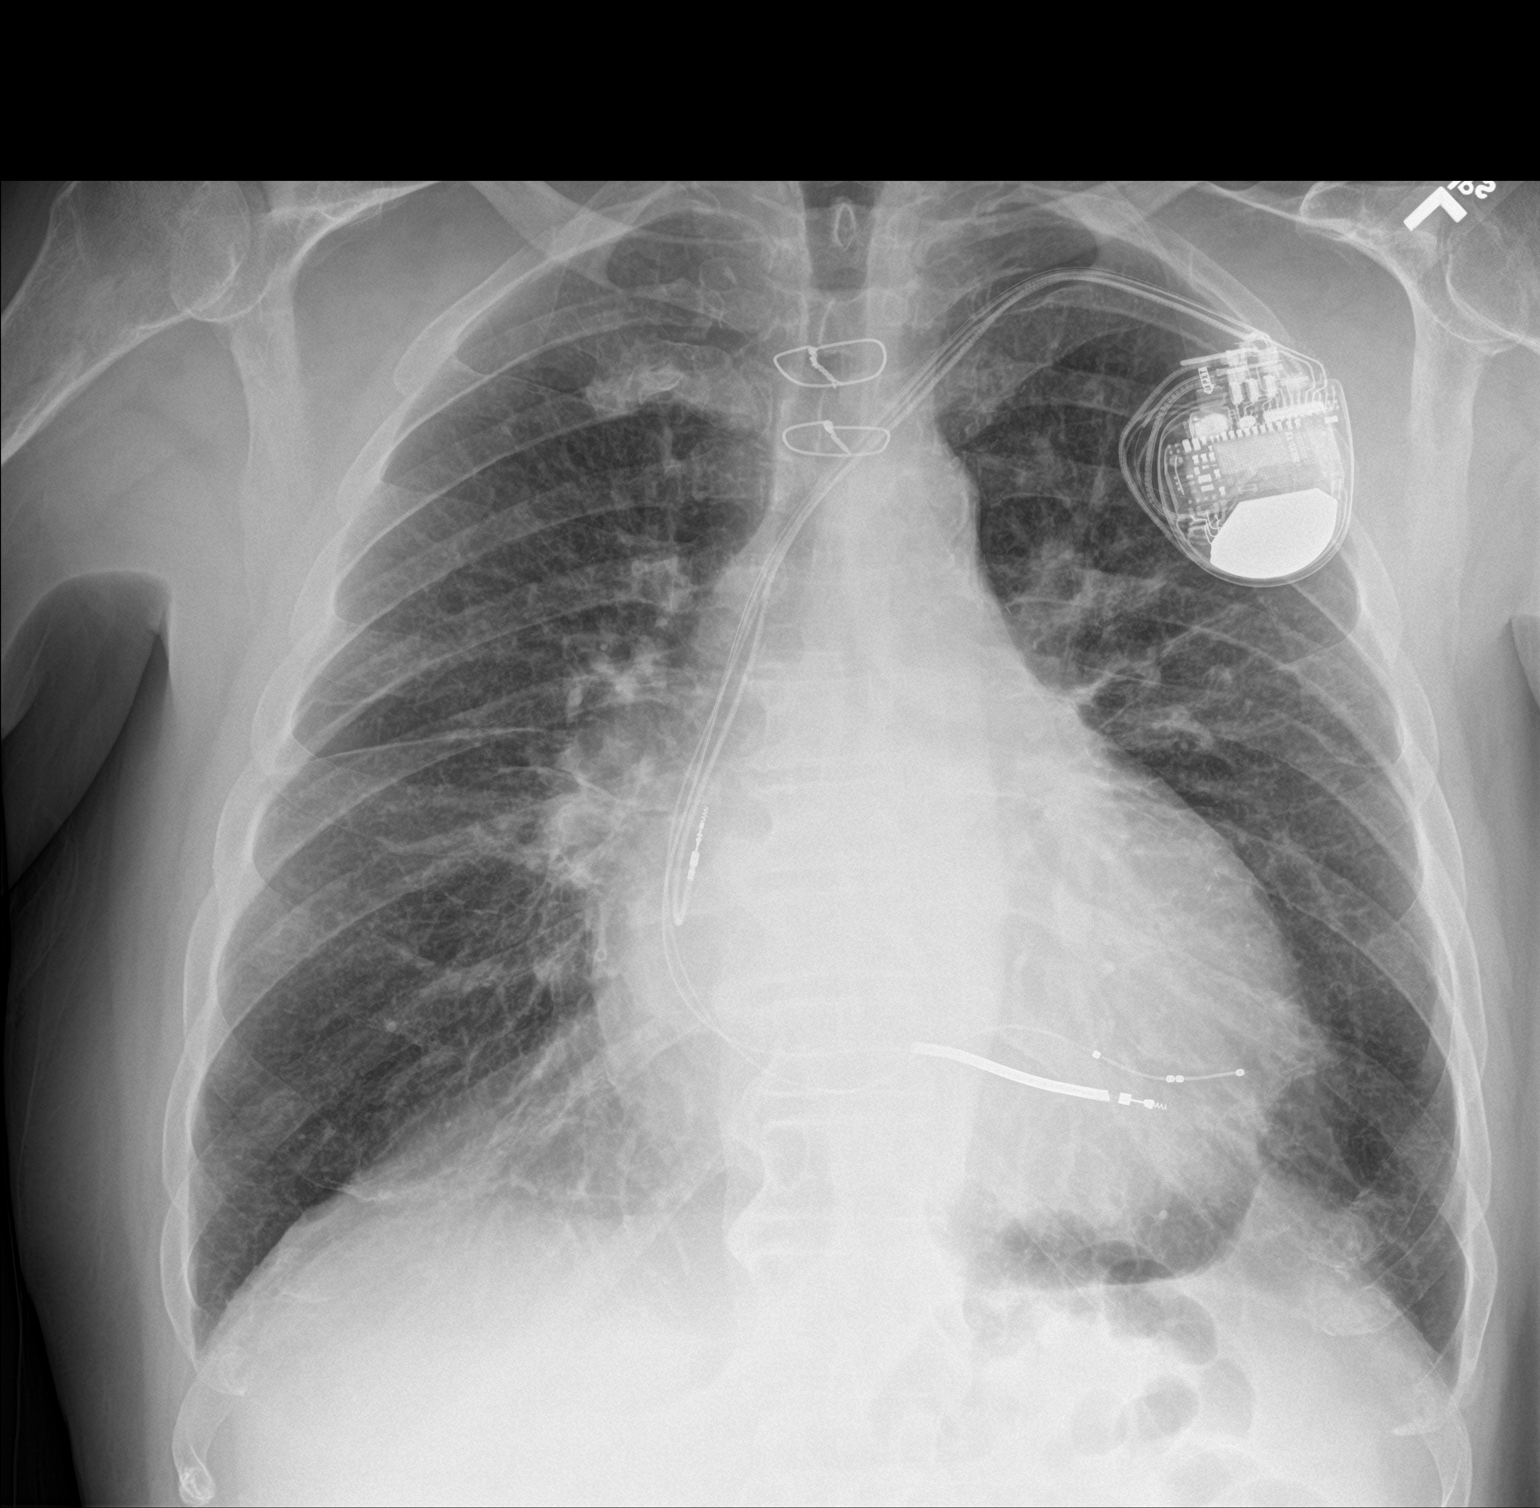

[chest lat]
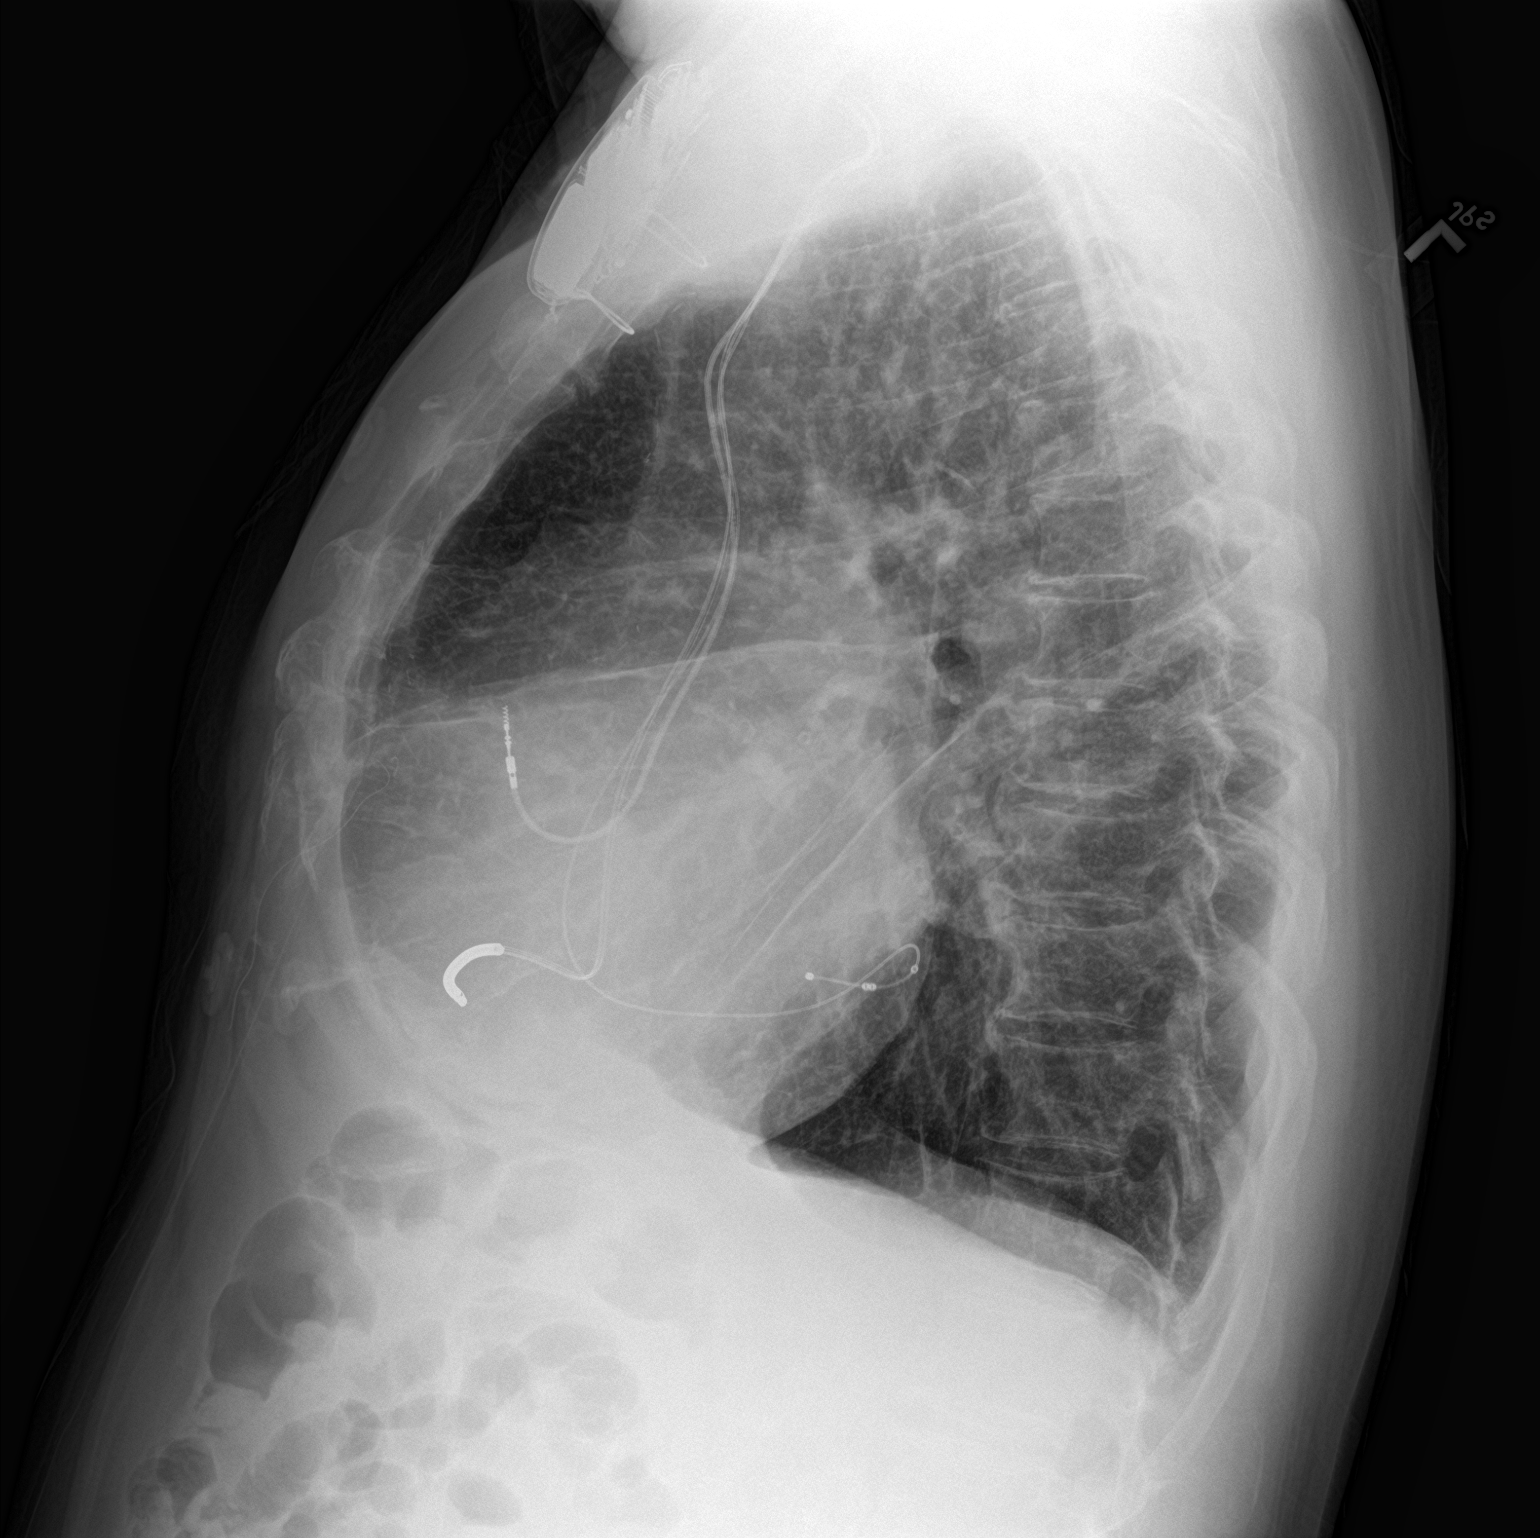

[2 of 2 positions shown; findings below may reference images not displayed]

FINDINGS: Cardiac enlargement. AICD unchanged. Negative for heart failure. No
edema or effusion

COPD with pulmonary hyperinflation.

Lingular patchy airspace disease has resolved in the interval.
IMPRESSION: COPD without infiltrate.

Cardiac enlargement without heart failure

## 2018-11-13 ENCOUNTER — Ambulatory Visit (INDEPENDENT_AMBULATORY_CARE_PROVIDER_SITE_OTHER): Payer: Medicare Other | Admitting: *Deleted

## 2018-11-13 DIAGNOSIS — I255 Ischemic cardiomyopathy: Secondary | ICD-10-CM

## 2018-11-13 LAB — CUP PACEART REMOTE DEVICE CHECK
Battery Remaining Longevity: 49 mo
Battery Voltage: 3 V
Brady Statistic AP VP Percent: 70.18 %
Brady Statistic AP VS Percent: 1.52 %
Brady Statistic AS VP Percent: 24.68 %
Brady Statistic AS VS Percent: 3.62 %
Brady Statistic RA Percent Paced: 70.38 %
Brady Statistic RV Percent Paced: 92.52 %
Date Time Interrogation Session: 20200626052404
HighPow Impedance: 80 Ohm
Implantable Lead Implant Date: 20160414
Implantable Lead Implant Date: 20160414
Implantable Lead Implant Date: 20200327
Implantable Lead Location: 753858
Implantable Lead Location: 753859
Implantable Lead Location: 753860
Implantable Lead Model: 4398
Implantable Lead Model: 5076
Implantable Lead Model: 6935
Implantable Pulse Generator Implant Date: 20200327
Lead Channel Impedance Value: 208.568
Lead Channel Impedance Value: 220.723
Lead Channel Impedance Value: 220.723
Lead Channel Impedance Value: 231.878
Lead Channel Impedance Value: 231.878
Lead Channel Impedance Value: 361 Ohm
Lead Channel Impedance Value: 399 Ohm
Lead Channel Impedance Value: 437 Ohm
Lead Channel Impedance Value: 437 Ohm
Lead Channel Impedance Value: 494 Ohm
Lead Channel Impedance Value: 494 Ohm
Lead Channel Impedance Value: 494 Ohm
Lead Channel Impedance Value: 513 Ohm
Lead Channel Impedance Value: 722 Ohm
Lead Channel Impedance Value: 722 Ohm
Lead Channel Impedance Value: 760 Ohm
Lead Channel Impedance Value: 779 Ohm
Lead Channel Impedance Value: 798 Ohm
Lead Channel Pacing Threshold Amplitude: 0.5 V
Lead Channel Pacing Threshold Amplitude: 0.875 V
Lead Channel Pacing Threshold Amplitude: 2.75 V
Lead Channel Pacing Threshold Pulse Width: 0.4 ms
Lead Channel Pacing Threshold Pulse Width: 0.4 ms
Lead Channel Pacing Threshold Pulse Width: 0.8 ms
Lead Channel Sensing Intrinsic Amplitude: 0.625 mV
Lead Channel Sensing Intrinsic Amplitude: 0.625 mV
Lead Channel Sensing Intrinsic Amplitude: 13.5 mV
Lead Channel Sensing Intrinsic Amplitude: 13.5 mV
Lead Channel Setting Pacing Amplitude: 1.5 V
Lead Channel Setting Pacing Amplitude: 3.5 V
Lead Channel Setting Pacing Amplitude: 4 V
Lead Channel Setting Pacing Pulse Width: 0.4 ms
Lead Channel Setting Pacing Pulse Width: 0.8 ms
Lead Channel Setting Sensing Sensitivity: 0.3 mV

## 2018-11-17 ENCOUNTER — Encounter: Payer: Medicare Other | Admitting: Internal Medicine

## 2018-11-18 ENCOUNTER — Encounter: Payer: Self-pay | Admitting: Cardiology

## 2018-11-18 NOTE — Progress Notes (Signed)
Remote ICD transmission.   

## 2018-11-23 ENCOUNTER — Encounter: Payer: Medicare Other | Admitting: Internal Medicine

## 2018-11-24 ENCOUNTER — Encounter: Payer: Medicare Other | Admitting: Internal Medicine

## 2018-11-30 ENCOUNTER — Telehealth: Payer: Self-pay | Admitting: Internal Medicine

## 2018-11-30 NOTE — Telephone Encounter (Signed)

## 2018-12-01 ENCOUNTER — Ambulatory Visit (INDEPENDENT_AMBULATORY_CARE_PROVIDER_SITE_OTHER): Payer: Medicare Other | Admitting: Internal Medicine

## 2018-12-01 ENCOUNTER — Other Ambulatory Visit: Payer: Self-pay

## 2018-12-01 ENCOUNTER — Encounter: Payer: Self-pay | Admitting: Internal Medicine

## 2018-12-01 VITALS — BP 102/64 | HR 60 | Ht 70.0 in | Wt 215.4 lb

## 2018-12-01 DIAGNOSIS — Z95810 Presence of automatic (implantable) cardiac defibrillator: Secondary | ICD-10-CM

## 2018-12-01 DIAGNOSIS — I5022 Chronic systolic (congestive) heart failure: Secondary | ICD-10-CM

## 2018-12-01 DIAGNOSIS — I255 Ischemic cardiomyopathy: Secondary | ICD-10-CM | POA: Diagnosis not present

## 2018-12-01 DIAGNOSIS — I4819 Other persistent atrial fibrillation: Secondary | ICD-10-CM

## 2018-12-01 LAB — CUP PACEART INCLINIC DEVICE CHECK
Battery Remaining Longevity: 55 mo
Battery Voltage: 2.99 V
Brady Statistic AP VP Percent: 69.54 %
Brady Statistic AP VS Percent: 1.38 %
Brady Statistic AS VP Percent: 25.58 %
Brady Statistic AS VS Percent: 3.5 %
Brady Statistic RA Percent Paced: 69.35 %
Brady Statistic RV Percent Paced: 92.25 %
Date Time Interrogation Session: 20200714091030
HighPow Impedance: 94 Ohm
Implantable Lead Implant Date: 20160414
Implantable Lead Implant Date: 20160414
Implantable Lead Implant Date: 20200327
Implantable Lead Location: 753858
Implantable Lead Location: 753859
Implantable Lead Location: 753860
Implantable Lead Model: 4398
Implantable Lead Model: 5076
Implantable Lead Model: 6935
Implantable Pulse Generator Implant Date: 20200327
Lead Channel Impedance Value: 227.604
Lead Channel Impedance Value: 235.98 Ohm
Lead Channel Impedance Value: 235.98 Ohm
Lead Channel Impedance Value: 246.635
Lead Channel Impedance Value: 246.635
Lead Channel Impedance Value: 399 Ohm
Lead Channel Impedance Value: 437 Ohm
Lead Channel Impedance Value: 475 Ohm
Lead Channel Impedance Value: 494 Ohm
Lead Channel Impedance Value: 513 Ohm
Lead Channel Impedance Value: 513 Ohm
Lead Channel Impedance Value: 513 Ohm
Lead Channel Impedance Value: 551 Ohm
Lead Channel Impedance Value: 779 Ohm
Lead Channel Impedance Value: 798 Ohm
Lead Channel Impedance Value: 798 Ohm
Lead Channel Impedance Value: 798 Ohm
Lead Channel Impedance Value: 855 Ohm
Lead Channel Pacing Threshold Amplitude: 0.625 V
Lead Channel Pacing Threshold Amplitude: 0.75 V
Lead Channel Pacing Threshold Amplitude: 1.875 V
Lead Channel Pacing Threshold Pulse Width: 0.4 ms
Lead Channel Pacing Threshold Pulse Width: 0.4 ms
Lead Channel Pacing Threshold Pulse Width: 0.8 ms
Lead Channel Sensing Intrinsic Amplitude: 0.75 mV
Lead Channel Sensing Intrinsic Amplitude: 1 mV
Lead Channel Sensing Intrinsic Amplitude: 20.75 mV
Lead Channel Sensing Intrinsic Amplitude: 22 mV
Lead Channel Setting Pacing Amplitude: 1.5 V
Lead Channel Setting Pacing Amplitude: 2.5 V
Lead Channel Setting Pacing Amplitude: 4 V
Lead Channel Setting Pacing Pulse Width: 0.4 ms
Lead Channel Setting Pacing Pulse Width: 0.8 ms
Lead Channel Setting Sensing Sensitivity: 0.3 mV

## 2018-12-01 NOTE — Patient Instructions (Signed)
Medication Instructions:  Your physician recommends that you continue on your current medications as directed. Please refer to the Current Medication list given to you today.  Labwork: None ordered.  Testing/Procedures: None ordered.  Follow-Up: Your physician wants you to follow-up in: 9 months with Dr. Lovena Le.   You will receive a reminder letter in the mail two months in advance. If you don't receive a letter, please call our office to schedule the follow-up appointment.  Remote monitoring is used to monitor your ICD from home. This monitoring reduces the number of office visits required to check your device to one time per year. It allows [place] to keep an eye on the functioning of your device to ensure it is working properly. You are scheduled for a device check from home on 03/04/2019. You may send your transmission at any time that day. If you have a wireless device, the transmission will be sent automatically. After your physician reviews your transmission, you will receive a postcard with your next transmission date.  Any Other Special Instructions Will Be Listed Below (If Applicable).  If you need a refill on your cardiac medications before your next appointment, please call your pharmacy.

## 2018-12-01 NOTE — Progress Notes (Signed)
HPI Joseph Carson returns today for ICD followup. He is a pleasant 63 yo man who has chronic systolic heart failure and CHB, s/p Biv ICD Insertion. He had a revision about 3 months ago when his RV lead was found not to be working normally. He has not had angina or sob.  No Known Allergies   Current Outpatient Medications  Medication Sig Dispense Refill  . acetaminophen (TYLENOL) 325 MG tablet Take 1-2 tablets (325-650 mg total) by mouth every 4 (four) hours as needed for mild pain.    Marland Kitchen atorvastatin (LIPITOR) 40 MG tablet TAKE 1 TABLET BY MOUTH DAILY 90 tablet 3  . carvedilol (COREG) 6.25 MG tablet TAKE 2 TABLETS WITH FOOD TWICE DAILY (Patient taking differently: Take 12.5 mg by mouth 2 (two) times daily with a meal. ) 360 tablet 3  . dofetilide (TIKOSYN) 250 MCG capsule TAKE 1 CAPSULE BY MOUTH TWICE DAILY (Patient taking differently: 250 mcg 2 (two) times daily. ) 180 capsule 1  . furosemide (LASIX) 40 MG tablet Take 1 tablet (40 mg total) by mouth daily. 90 tablet 2  . Multiple Vitamins-Minerals (HAIR/SKIN/NAILS PO) Take 1 tablet by mouth daily.    Marland Kitchen spironolactone (ALDACTONE) 25 MG tablet TAKE 1 TABLET BY MOUTH DAILY 90 tablet 3  . warfarin (COUMADIN) 5 MG tablet TAKE AS DIRECTED BY COUMADIN CLINIC. (Patient taking differently: Take 2.5-5 mg by mouth See admin instructions. Take as directed by Coumadin Clinic 2.5 mg on Sunday, all other days is 5 mg) 35 tablet 2   No current facility-administered medications for this visit.      Past Medical History:  Diagnosis Date  . AICD (automatic cardioverter/defibrillator) present   . Atrial fibrillation, chronic   . CAD (coronary artery disease)    a. s/p CABG x 3 03/2013 (L-LAD, S-RI, S-PDA) in [place]  . CAP (community acquired pneumonia) 08/22/2015  . CHF (congestive heart failure) ([place])   . Chronic combined systolic and diastolic heart failure [facility]) November 2014  . Coronary artery disease   . Full dentures   . HLD (hyperlipidemia)     . Hyperbilirubinemia   . Ischemic cardiomyopathy    a. echo (10/15):  EF 20%, diff HK, apical AK, mobile density inf wall near papillary muscle (?ruptured) with mod MR, mod reduced RVSF, mild PI, PASP 37 mmHg b. s/p CRTD   . LBBB (left bundle branch block)    . LV (left ventricular) mural thrombus     a. TEE (10/15):  EF 15-20%, septum and ant walls AK, lat wall HK, no papillary muscle rupture, probable small thrombus on ant/ant-septal wall, mild MR, mod decreased RVSF, neg bubble study  . Mitral regurgitation     mild by TEE 02/2014  . NSVT (nonsustained ventricular tachycardia) ([place])        . Obesity   . Pulmonary hypertension ([place])   . Umbilical hernia    "have had it since age [age]"  . Umbilical hernia   . Wears glasses     ROS:   All systems reviewed and negative except as noted in the HPI.   Past Surgical History:  Procedure Laterality Date  . BI-VENTRICULAR IMPLANTABLE CARDIOVERTER DEFIBRILLATOR N/A 09/01/2014   MDT CRTD implanted by Dr Lovena Le  . CARDIAC CATHETERIZATION  03/2013  . CARDIOVERSION N/A 12/04/2017   Procedure: CARDIOVERSION;  Surgeon: Evans Lance, MD;  Location: [facility];  Service: Cardiovascular;  Laterality: N/A;  . CORONARY ARTERY BYPASS GRAFT  03/2013  LIMA to the LAD, SVG to the ramus, SVG to the PDA  . CORONARY ARTERY BYPASS GRAFT  2014   CABG "X3"  . FRACTURE SURGERY    . ICD LEAD REMOVAL N/A 08/14/2018   Procedure: ICD LEAD REMOVAL;  Surgeon: Marinus Mawaylor, Gregg W, MD;  [facility];  Service: Cardiovascular;  Laterality: N/A;  . INSERT / REPLACE / REMOVE PACEMAKER  2016  . LEAD INSERTION N/A 08/14/2018   Procedure: LEAD INSERTION;  Surgeon: Marinus Mawaylor, Gregg W, MD;  [facility];  Service: Cardiovascular;  Laterality: N/A;  . LEAD REVISION/REPAIR N/A 12/04/2017   Procedure: LEAD REVISION/REPAIR;  Surgeon: Marinus Mawaylor, Gregg W, MD;  [facility];  Service: Cardiovascular;  Laterality: N/A;  . MULTIPLE TOOTH EXTRACTIONS    .  PATELLA FRACTURE SURGERY Left 1972  . TEE WITHOUT CARDIOVERSION N/A 03/04/2014   Procedure: TRANSESOPHAGEAL ECHOCARDIOGRAM (TEE);  Surgeon: Laurey Moralealton S McLean, MD;  [facility];  Service: Cardiovascular;  Laterality: N/A;  . TEE WITHOUT CARDIOVERSION N/A 12/02/2017   Procedure: TRANSESOPHAGEAL ECHOCARDIOGRAM (TEE);  Surgeon: Jodelle Redhristopher, Bridgette, MD;  [facility];  Service: Cardiovascular;  Laterality: N/A;     Family History  Problem Relation Age of Onset  . Diabetes Mother        Deceased  . Thyroid disease Mother      Social History   Socioeconomic History  . Marital status: Divorced    Spouse name: Not on file  . Number of children: Not on file  . Years of education: Not on file  . Highest education level: Not on file  Occupational History  . Not on file  Social Needs  . Financial resource strain: Not on file  . Food insecurity    Worry: Not on file    Inability: Not on file  . Transportation needs    Medical: Not on file    Non-medical: Not on file  Tobacco Use  . Smoking status: Current Every Day Smoker    Packs/day: 0.25    Years: 47.00    Pack years: 11.75    Types: Cigarettes  . Smokeless tobacco: Never Used  Substance and Sexual Activity  . Alcohol use: Not Currently    Alcohol/week: 0.0 standard drinks    Comment: "quit drinking when I was 23"  . Drug use: Yes    Types: Marijuana    Comment:  "weekly" last use week of 08/07/2018  . Sexual activity: Not Currently  Lifestyle  . Physical activity    Days per week: Not on file    Minutes per session: Not on file  . Stress: Not on file  Relationships  . Social Musicianconnections    Talks on phone: Not on file    Gets together: Not on file    Attends religious service: Not on file    Active member of club or organization: Not on file    Attends meetings of clubs or organizations: Not on file    Relationship status: Not on file  . Intimate partner violence    Fear of current or ex  partner: Not on file    Emotionally abused: Not on file    Physically abused: Not on file    Forced sexual activity: Not on file  Other Topics Concern  . Not on file  Social History Narrative   ** Merged History Encounter **         BP 102/64   Pulse 60   Ht 5\' 10"  (1.778 m)  Wt 215 lb 6.4 oz (97.7 kg)   SpO2 97%   BMI 30.91 kg/m   Physical Exam:  Well appearing NAD HEENT: Unremarkable Neck:  No JVD, no thyromegally Lymphatics:  No adenopathy Back:  No CVA tenderness Lungs:  Clear with no wheezes HEART:  Regular rate rhythm, no murmurs, no rubs, no clicks Abd:  soft, positive bowel sounds, no organomegally, no rebound, no guarding Ext:  2 plus pulses, no edema, no cyanosis, no clubbing Skin:  No rashes no nodules Neuro:  CN II through XII intact, motor grossly intact  EKG - NSR with biv pacing  DEVICE  Normal device function.  See PaceArt for details.   Assess/Plan: 1. Chronic systolic heart failure - his symptoms are class 2. He is encouraged to maintain a low sodium diet. He will continue his current meds.  2. ICD - his Medtronic biv ICD is working normally. His device has been reprogrammed to maximize battery longevity. 3. VT/VF - he has had no recurrent arrhythmias. 4. PAF - he will continue dofetilide 5. CAD - he is s/p cABG with no anginal symptoms.  Joseph Carson,M.D.

## 2018-12-15 ENCOUNTER — Other Ambulatory Visit: Payer: Self-pay

## 2018-12-15 ENCOUNTER — Ambulatory Visit (INDEPENDENT_AMBULATORY_CARE_PROVIDER_SITE_OTHER): Payer: Medicare Other | Admitting: *Deleted

## 2018-12-15 DIAGNOSIS — Z5181 Encounter for therapeutic drug level monitoring: Secondary | ICD-10-CM | POA: Diagnosis not present

## 2018-12-15 LAB — POCT INR: INR: 2.4 (ref 2.0–3.0)

## 2018-12-15 NOTE — Patient Instructions (Signed)
Description   Continue on same dosage 1 tablet daily except 1/2 tablet on Sundays. Recheck INR in 8 weeks.  Call Coumadin Clinic if placed on any new medications or scheduled for any procedures - [phone].

## 2018-12-30 ENCOUNTER — Other Ambulatory Visit: Payer: Self-pay | Admitting: Internal Medicine

## 2019-01-11 ENCOUNTER — Other Ambulatory Visit: Payer: Self-pay | Admitting: Cardiovascular Disease

## 2019-02-16 ENCOUNTER — Ambulatory Visit (INDEPENDENT_AMBULATORY_CARE_PROVIDER_SITE_OTHER): Payer: Medicare Other | Admitting: *Deleted

## 2019-02-16 ENCOUNTER — Other Ambulatory Visit: Payer: Self-pay

## 2019-02-16 DIAGNOSIS — Z5181 Encounter for therapeutic drug level monitoring: Secondary | ICD-10-CM

## 2019-02-16 LAB — POCT INR: INR: 2.4 (ref 2.0–3.0)

## 2019-02-16 NOTE — Patient Instructions (Signed)
Description   Continue on same dosage 1 tablet daily except 1/2 tablet on Sundays. Recheck INR in 8 weeks.  Call Coumadin Clinic if placed on any new medications or scheduled for any procedures - [phone]    

## 2019-02-22 ENCOUNTER — Other Ambulatory Visit: Payer: Self-pay | Admitting: Internal Medicine

## 2019-02-22 MED ORDER — DOFETILIDE 250 MCG PO CAPS: 250.0000 ug | ORAL_CAPSULE | Freq: Two times a day (BID) | ORAL | 2 refills | Status: DC

## 2019-02-22 NOTE — Telephone Encounter (Signed)
°*  STAT* If patient is at the pharmacy, call can be transferred to refill team.   1. Which medications need to be refilled? (please list name of each medication and dose if known)  Dofetilide  2. Which pharmacy/location (including street and city if local pharmacy) is medication to be sent to? [place], [place], [place]  3. Do they need a 30 day or 90 day supply? *90 days and refills

## 2019-02-22 NOTE — Telephone Encounter (Signed)
Pt's medication was sent to pt's pharmacy as requested. Confirmation received.  °

## 2019-03-04 ENCOUNTER — Ambulatory Visit (INDEPENDENT_AMBULATORY_CARE_PROVIDER_SITE_OTHER): Payer: Medicare Other | Admitting: *Deleted

## 2019-03-04 DIAGNOSIS — I4819 Other persistent atrial fibrillation: Secondary | ICD-10-CM | POA: Diagnosis not present

## 2019-03-04 DIAGNOSIS — I5022 Chronic systolic (congestive) heart failure: Secondary | ICD-10-CM

## 2019-03-04 LAB — CUP PACEART REMOTE DEVICE CHECK
Battery Remaining Longevity: 60 mo
Battery Voltage: 2.99 V
Brady Statistic AP VP Percent: 67.24 %
Brady Statistic AP VS Percent: 0.52 %
Brady Statistic AS VP Percent: 31.14 %
Brady Statistic AS VS Percent: 1.1 %
Brady Statistic RA Percent Paced: 66.78 %
Brady Statistic RV Percent Paced: 96.4 %
Date Time Interrogation Session: 20201015052504
HighPow Impedance: 85 Ohm
Implantable Lead Implant Date: 20160414
Implantable Lead Implant Date: 20160414
Implantable Lead Implant Date: 20200327
Implantable Lead Location: 753858
Implantable Lead Location: 753859
Implantable Lead Location: 753860
Implantable Lead Model: 4398
Implantable Lead Model: 5076
Implantable Lead Model: 6935
Implantable Pulse Generator Implant Date: 20200327
Lead Channel Impedance Value: 197.69 Ohm
Lead Channel Impedance Value: 205.114
Lead Channel Impedance Value: 205.114
Lead Channel Impedance Value: 227.604
Lead Channel Impedance Value: 227.604
Lead Channel Impedance Value: 342 Ohm
Lead Channel Impedance Value: 361 Ohm
Lead Channel Impedance Value: 437 Ohm
Lead Channel Impedance Value: 475 Ohm
Lead Channel Impedance Value: 475 Ohm
Lead Channel Impedance Value: 475 Ohm
Lead Channel Impedance Value: 475 Ohm
Lead Channel Impedance Value: 513 Ohm
Lead Channel Impedance Value: 684 Ohm
Lead Channel Impedance Value: 684 Ohm
Lead Channel Impedance Value: 722 Ohm
Lead Channel Impedance Value: 760 Ohm
Lead Channel Impedance Value: 760 Ohm
Lead Channel Pacing Threshold Amplitude: 0.5 V
Lead Channel Pacing Threshold Amplitude: 0.75 V
Lead Channel Pacing Threshold Amplitude: 2.25 V
Lead Channel Pacing Threshold Pulse Width: 0.4 ms
Lead Channel Pacing Threshold Pulse Width: 0.4 ms
Lead Channel Pacing Threshold Pulse Width: 0.8 ms
Lead Channel Sensing Intrinsic Amplitude: 0.625 mV
Lead Channel Sensing Intrinsic Amplitude: 14.25 mV
Lead Channel Setting Pacing Amplitude: 1.5 V
Lead Channel Setting Pacing Amplitude: 2.5 V
Lead Channel Setting Pacing Amplitude: 3.25 V
Lead Channel Setting Pacing Pulse Width: 0.4 ms
Lead Channel Setting Pacing Pulse Width: 0.8 ms
Lead Channel Setting Sensing Sensitivity: 0.3 mV

## 2019-03-18 NOTE — Progress Notes (Signed)
Remote ICD transmission.   

## 2019-04-13 ENCOUNTER — Other Ambulatory Visit: Payer: Self-pay

## 2019-04-13 ENCOUNTER — Ambulatory Visit (INDEPENDENT_AMBULATORY_CARE_PROVIDER_SITE_OTHER): Payer: Medicare Other | Admitting: Pharmacist

## 2019-04-13 DIAGNOSIS — I513 Intracardiac thrombosis, not elsewhere classified: Secondary | ICD-10-CM

## 2019-04-13 DIAGNOSIS — Z5181 Encounter for therapeutic drug level monitoring: Secondary | ICD-10-CM | POA: Diagnosis not present

## 2019-04-13 LAB — POCT INR: INR: 2.2 (ref 2.0–3.0)

## 2019-04-13 NOTE — Patient Instructions (Signed)
Description   Continue on same dosage 1 tablet daily except 1/2 tablet on Sundays. Recheck INR in 8 weeks.  Call Coumadin Clinic if placed on any new medications or scheduled for any procedures - [phone]    

## 2019-06-03 ENCOUNTER — Ambulatory Visit (INDEPENDENT_AMBULATORY_CARE_PROVIDER_SITE_OTHER): Payer: Medicare Other | Admitting: *Deleted

## 2019-06-03 DIAGNOSIS — I5022 Chronic systolic (congestive) heart failure: Secondary | ICD-10-CM | POA: Diagnosis not present

## 2019-06-03 LAB — CUP PACEART REMOTE DEVICE CHECK
Battery Remaining Longevity: 57 mo
Battery Voltage: 2.98 V
Brady Statistic AP VP Percent: 69.38 %
Brady Statistic AP VS Percent: 0.67 %
Brady Statistic AS VP Percent: 28.31 %
Brady Statistic AS VS Percent: 1.65 %
Brady Statistic RA Percent Paced: 68.96 %
Brady Statistic RV Percent Paced: 95.91 %
Date Time Interrogation Session: 20210114012404
HighPow Impedance: 87 Ohm
Implantable Lead Implant Date: 20160414
Implantable Lead Implant Date: 20160414
Implantable Lead Implant Date: 20200327
Implantable Lead Location: 753858
Implantable Lead Location: 753859
Implantable Lead Location: 753860
Implantable Lead Model: 4398
Implantable Lead Model: 5076
Implantable Lead Model: 6935
Implantable Pulse Generator Implant Date: 20200327
Lead Channel Impedance Value: 197.69 Ohm
Lead Channel Impedance Value: 205.114
Lead Channel Impedance Value: 205.114
Lead Channel Impedance Value: 227.604
Lead Channel Impedance Value: 227.604
Lead Channel Impedance Value: 342 Ohm
Lead Channel Impedance Value: 361 Ohm
Lead Channel Impedance Value: 437 Ohm
Lead Channel Impedance Value: 475 Ohm
Lead Channel Impedance Value: 475 Ohm
Lead Channel Impedance Value: 475 Ohm
Lead Channel Impedance Value: 475 Ohm
Lead Channel Impedance Value: 494 Ohm
Lead Channel Impedance Value: 684 Ohm
Lead Channel Impedance Value: 703 Ohm
Lead Channel Impedance Value: 722 Ohm
Lead Channel Impedance Value: 760 Ohm
Lead Channel Impedance Value: 779 Ohm
Lead Channel Pacing Threshold Amplitude: 0.75 V
Lead Channel Pacing Threshold Amplitude: 1 V
Lead Channel Pacing Threshold Amplitude: 2.75 V
Lead Channel Pacing Threshold Pulse Width: 0.4 ms
Lead Channel Pacing Threshold Pulse Width: 0.4 ms
Lead Channel Pacing Threshold Pulse Width: 0.8 ms
Lead Channel Sensing Intrinsic Amplitude: 0.625 mV
Lead Channel Sensing Intrinsic Amplitude: 0.625 mV
Lead Channel Sensing Intrinsic Amplitude: 17.25 mV
Lead Channel Sensing Intrinsic Amplitude: 17.25 mV
Lead Channel Setting Pacing Amplitude: 1.5 V
Lead Channel Setting Pacing Amplitude: 2.5 V
Lead Channel Setting Pacing Amplitude: 3.25 V
Lead Channel Setting Pacing Pulse Width: 0.4 ms
Lead Channel Setting Pacing Pulse Width: 0.8 ms
Lead Channel Setting Sensing Sensitivity: 0.3 mV

## 2019-06-04 NOTE — Progress Notes (Signed)
ICD remote 

## 2019-06-15 ENCOUNTER — Other Ambulatory Visit: Payer: Self-pay

## 2019-06-15 ENCOUNTER — Ambulatory Visit (INDEPENDENT_AMBULATORY_CARE_PROVIDER_SITE_OTHER): Payer: Medicare Other | Admitting: Pharmacist

## 2019-06-15 DIAGNOSIS — I513 Intracardiac thrombosis, not elsewhere classified: Secondary | ICD-10-CM

## 2019-06-15 DIAGNOSIS — Z5181 Encounter for therapeutic drug level monitoring: Secondary | ICD-10-CM

## 2019-06-15 LAB — POCT INR: INR: 3.4 — AB (ref 2.0–3.0)

## 2019-06-15 NOTE — Patient Instructions (Signed)
Description   Skip Coumadin today, then continue on same dosage 1 tablet daily except 1/2 tablet on Sundays. Recheck INR in 4 weeks.  Call Coumadin Clinic if placed on any new medications or scheduled for any procedures - [phone]

## 2019-07-02 ENCOUNTER — Other Ambulatory Visit: Payer: Self-pay | Admitting: Internal Medicine

## 2019-07-13 ENCOUNTER — Ambulatory Visit (INDEPENDENT_AMBULATORY_CARE_PROVIDER_SITE_OTHER): Payer: Medicare Other | Admitting: Pharmacist

## 2019-07-13 ENCOUNTER — Other Ambulatory Visit: Payer: Self-pay

## 2019-07-13 DIAGNOSIS — I513 Intracardiac thrombosis, not elsewhere classified: Secondary | ICD-10-CM | POA: Diagnosis not present

## 2019-07-13 DIAGNOSIS — Z5181 Encounter for therapeutic drug level monitoring: Secondary | ICD-10-CM

## 2019-07-13 LAB — POCT INR: INR: 2.7 (ref 2.0–3.0)

## 2019-07-13 NOTE — Patient Instructions (Signed)
Description   Continue on same dosage 1 tablet daily except 1/2 tablet on Sundays. Recheck INR in 6 weeks.  Call Coumadin Clinic if placed on any new medications or scheduled for any procedures - [phone]

## 2019-08-24 ENCOUNTER — Ambulatory Visit (INDEPENDENT_AMBULATORY_CARE_PROVIDER_SITE_OTHER): Payer: Medicare Other | Admitting: *Deleted

## 2019-08-24 ENCOUNTER — Other Ambulatory Visit: Payer: Self-pay

## 2019-08-24 DIAGNOSIS — Z5181 Encounter for therapeutic drug level monitoring: Secondary | ICD-10-CM | POA: Diagnosis not present

## 2019-08-24 DIAGNOSIS — I513 Intracardiac thrombosis, not elsewhere classified: Secondary | ICD-10-CM | POA: Diagnosis not present

## 2019-08-24 LAB — POCT INR: INR: 2.3 (ref 2.0–3.0)

## 2019-08-24 NOTE — Patient Instructions (Signed)
Continue on same dosage 1 tablet daily except 1/2 tablet on Sundays. Recheck INR in 6 weeks.  Call Coumadin Clinic if placed on any new medications or scheduled for any procedures - [phone]

## 2019-09-02 ENCOUNTER — Ambulatory Visit (INDEPENDENT_AMBULATORY_CARE_PROVIDER_SITE_OTHER): Payer: Medicare Other | Admitting: *Deleted

## 2019-09-02 DIAGNOSIS — I5022 Chronic systolic (congestive) heart failure: Secondary | ICD-10-CM

## 2019-09-02 LAB — CUP PACEART REMOTE DEVICE CHECK
Battery Remaining Longevity: 56 mo
Battery Voltage: 2.97 V
Brady Statistic AP VP Percent: 76.8 %
Brady Statistic AP VS Percent: 0.66 %
Brady Statistic AS VP Percent: 21.35 %
Brady Statistic AS VS Percent: 1.18 %
Brady Statistic RA Percent Paced: 76.77 %
Brady Statistic RV Percent Paced: 97.22 %
Date Time Interrogation Session: 20210415012304
HighPow Impedance: 90 Ohm
Implantable Lead Implant Date: 20160414
Implantable Lead Implant Date: 20160414
Implantable Lead Implant Date: 20200327
Implantable Lead Location: 753858
Implantable Lead Location: 753859
Implantable Lead Location: 753860
Implantable Lead Model: 4398
Implantable Lead Model: 5076
Implantable Lead Model: 6935
Implantable Pulse Generator Implant Date: 20200327
Lead Channel Impedance Value: 208.568
Lead Channel Impedance Value: 216.848
Lead Channel Impedance Value: 220.723
Lead Channel Impedance Value: 227.604
Lead Channel Impedance Value: 231.878
Lead Channel Impedance Value: 399 Ohm
Lead Channel Impedance Value: 399 Ohm
Lead Channel Impedance Value: 437 Ohm
Lead Channel Impedance Value: 437 Ohm
Lead Channel Impedance Value: 475 Ohm
Lead Channel Impedance Value: 494 Ohm
Lead Channel Impedance Value: 513 Ohm
Lead Channel Impedance Value: 551 Ohm
Lead Channel Impedance Value: 722 Ohm
Lead Channel Impedance Value: 760 Ohm
Lead Channel Impedance Value: 760 Ohm
Lead Channel Impedance Value: 798 Ohm
Lead Channel Impedance Value: 798 Ohm
Lead Channel Pacing Threshold Amplitude: 0.5 V
Lead Channel Pacing Threshold Amplitude: 1 V
Lead Channel Pacing Threshold Amplitude: 2.75 V
Lead Channel Pacing Threshold Pulse Width: 0.4 ms
Lead Channel Pacing Threshold Pulse Width: 0.4 ms
Lead Channel Pacing Threshold Pulse Width: 0.8 ms
Lead Channel Sensing Intrinsic Amplitude: 1 mV
Lead Channel Sensing Intrinsic Amplitude: 1 mV
Lead Channel Sensing Intrinsic Amplitude: 14.5 mV
Lead Channel Sensing Intrinsic Amplitude: 14.5 mV
Lead Channel Setting Pacing Amplitude: 1.5 V
Lead Channel Setting Pacing Amplitude: 2.5 V
Lead Channel Setting Pacing Amplitude: 3.25 V
Lead Channel Setting Pacing Pulse Width: 0.4 ms
Lead Channel Setting Pacing Pulse Width: 0.8 ms
Lead Channel Setting Sensing Sensitivity: 0.3 mV

## 2019-09-02 NOTE — Progress Notes (Signed)
ICD Remote  

## 2019-10-04 ENCOUNTER — Other Ambulatory Visit: Payer: Self-pay | Admitting: Internal Medicine

## 2019-10-05 ENCOUNTER — Other Ambulatory Visit: Payer: Self-pay

## 2019-10-05 ENCOUNTER — Ambulatory Visit (INDEPENDENT_AMBULATORY_CARE_PROVIDER_SITE_OTHER): Payer: Medicare Other | Admitting: Pharmacist

## 2019-10-05 DIAGNOSIS — Z5181 Encounter for therapeutic drug level monitoring: Secondary | ICD-10-CM

## 2019-10-05 DIAGNOSIS — I513 Intracardiac thrombosis, not elsewhere classified: Secondary | ICD-10-CM

## 2019-10-05 LAB — POCT INR: INR: 2.4 (ref 2.0–3.0)

## 2019-10-05 NOTE — Patient Instructions (Signed)
Description   Continue on same dosage 1 tablet daily except 1/2 tablet on Sundays. Recheck INR in 8 weeks.  Call Coumadin Clinic if placed on any new medications or scheduled for any procedures - [phone]

## 2019-11-24 ENCOUNTER — Other Ambulatory Visit: Payer: Self-pay | Admitting: Internal Medicine

## 2019-11-30 ENCOUNTER — Other Ambulatory Visit: Payer: Self-pay

## 2019-11-30 ENCOUNTER — Ambulatory Visit (INDEPENDENT_AMBULATORY_CARE_PROVIDER_SITE_OTHER): Payer: Medicare Other | Admitting: Pharmacist Clinician (PhC)/ Clinical Pharmacy Specialist

## 2019-11-30 DIAGNOSIS — Z5181 Encounter for therapeutic drug level monitoring: Secondary | ICD-10-CM

## 2019-11-30 DIAGNOSIS — I513 Intracardiac thrombosis, not elsewhere classified: Secondary | ICD-10-CM | POA: Diagnosis not present

## 2019-11-30 LAB — POCT INR: INR: 3.5 — AB (ref 2.0–3.0)

## 2019-11-30 NOTE — Patient Instructions (Signed)
Hold today (Tuesday) and then continue on same dosage 1 tablet daily except 1/2 tablet on Sundays. Recheck INR in 4 weeks.  Call Coumadin Clinic if placed on any new medications or scheduled for any procedures - [phone]

## 2019-12-02 ENCOUNTER — Ambulatory Visit (INDEPENDENT_AMBULATORY_CARE_PROVIDER_SITE_OTHER): Payer: Medicare Other | Admitting: *Deleted

## 2019-12-02 DIAGNOSIS — I5022 Chronic systolic (congestive) heart failure: Secondary | ICD-10-CM | POA: Diagnosis not present

## 2019-12-02 DIAGNOSIS — I447 Left bundle-branch block, unspecified: Secondary | ICD-10-CM

## 2019-12-02 LAB — CUP PACEART REMOTE DEVICE CHECK
Battery Remaining Longevity: 50 mo
Battery Voltage: 2.97 V
Brady Statistic AP VP Percent: 74.77 %
Brady Statistic AP VS Percent: 0.49 %
Brady Statistic AS VP Percent: 22.08 %
Brady Statistic AS VS Percent: 2.66 %
Brady Statistic RA Percent Paced: 74.38 %
Brady Statistic RV Percent Paced: 95.64 %
Date Time Interrogation Session: 20210715022603
HighPow Impedance: 90 Ohm
Implantable Lead Implant Date: 20160414
Implantable Lead Implant Date: 20160414
Implantable Lead Implant Date: 20200327
Implantable Lead Location: 753858
Implantable Lead Location: 753859
Implantable Lead Location: 753860
Implantable Lead Model: 4398
Implantable Lead Model: 5076
Implantable Lead Model: 6935
Implantable Pulse Generator Implant Date: 20200327
Lead Channel Impedance Value: 205.114
Lead Channel Impedance Value: 205.114
Lead Channel Impedance Value: 208.578
Lead Channel Impedance Value: 237.5 Ohm
Lead Channel Impedance Value: 242.157
Lead Channel Impedance Value: 361 Ohm
Lead Channel Impedance Value: 399 Ohm
Lead Channel Impedance Value: 475 Ohm
Lead Channel Impedance Value: 475 Ohm
Lead Channel Impedance Value: 475 Ohm
Lead Channel Impedance Value: 494 Ohm
Lead Channel Impedance Value: 513 Ohm
Lead Channel Impedance Value: 513 Ohm
Lead Channel Impedance Value: 760 Ohm
Lead Channel Impedance Value: 760 Ohm
Lead Channel Impedance Value: 760 Ohm
Lead Channel Impedance Value: 798 Ohm
Lead Channel Impedance Value: 836 Ohm
Lead Channel Pacing Threshold Amplitude: 0.625 V
Lead Channel Pacing Threshold Amplitude: 1.125 V
Lead Channel Pacing Threshold Amplitude: 2.375 V
Lead Channel Pacing Threshold Pulse Width: 0.4 ms
Lead Channel Pacing Threshold Pulse Width: 0.4 ms
Lead Channel Pacing Threshold Pulse Width: 0.8 ms
Lead Channel Sensing Intrinsic Amplitude: 0.875 mV
Lead Channel Sensing Intrinsic Amplitude: 0.875 mV
Lead Channel Sensing Intrinsic Amplitude: 16.875 mV
Lead Channel Sensing Intrinsic Amplitude: 16.875 mV
Lead Channel Setting Pacing Amplitude: 1.5 V
Lead Channel Setting Pacing Amplitude: 2.5 V
Lead Channel Setting Pacing Amplitude: 3.25 V
Lead Channel Setting Pacing Pulse Width: 0.4 ms
Lead Channel Setting Pacing Pulse Width: 0.8 ms
Lead Channel Setting Sensing Sensitivity: 0.3 mV

## 2019-12-03 NOTE — Progress Notes (Signed)
Remote ICD transmission.   

## 2019-12-07 ENCOUNTER — Encounter: Payer: Self-pay | Admitting: Internal Medicine

## 2019-12-07 ENCOUNTER — Other Ambulatory Visit: Payer: Self-pay

## 2019-12-07 ENCOUNTER — Ambulatory Visit (INDEPENDENT_AMBULATORY_CARE_PROVIDER_SITE_OTHER): Payer: Medicare Other | Admitting: Internal Medicine

## 2019-12-07 VITALS — BP 114/70 | HR 65 | Ht 70.0 in | Wt 216.0 lb

## 2019-12-07 DIAGNOSIS — I472 Ventricular tachycardia: Secondary | ICD-10-CM | POA: Diagnosis not present

## 2019-12-07 DIAGNOSIS — I5022 Chronic systolic (congestive) heart failure: Secondary | ICD-10-CM

## 2019-12-07 DIAGNOSIS — I4819 Other persistent atrial fibrillation: Secondary | ICD-10-CM | POA: Diagnosis not present

## 2019-12-07 DIAGNOSIS — I4729 Other ventricular tachycardia: Secondary | ICD-10-CM

## 2019-12-07 DIAGNOSIS — I2510 Atherosclerotic heart disease of native coronary artery without angina pectoris: Secondary | ICD-10-CM

## 2019-12-07 DIAGNOSIS — Z95810 Presence of automatic (implantable) cardiac defibrillator: Secondary | ICD-10-CM

## 2019-12-07 NOTE — Patient Instructions (Signed)
Medication Instructions:  Your physician recommends that you continue on your current medications as directed. Please refer to the Current Medication list given to you today.  *If you need a refill on your cardiac medications before your next appointment, please call your pharmacy*  Lab Work: None ordered.  If you have labs (blood work) drawn today and your tests are completely normal, you will receive your results only by: Marland Kitchen MyChart Message (if you have MyChart) OR . A paper copy in the mail If you have any lab test that is abnormal or we need to change your treatment, we will call you to review the results.  Testing/Procedures: None ordered.  Follow-Up: At [facility], you and your health needs are our priority.  As part of our continuing mission to provide you with exceptional heart care, we have created designated Provider Care Teams.  These Care Teams include your primary Cardiologist (physician) and Advanced Practice Providers (APPs -  Physician Assistants and Nurse Practitioners) who all work together to provide you with the care you need, when you need it.  We recommend signing up for the patient portal called "MyChart".  Sign up information is provided on this After Visit Summary.  MyChart is used to connect with patients for Virtual Visits (Telemedicine).  Patients are able to view lab/test results, encounter notes, upcoming appointments, etc.  Non-urgent messages can be sent to your provider as well.   To learn more about what you can do with MyChart, go to [email or web address].    Your next appointment:   Your physician wants you to follow-up in: 1 year with Dr. Ladona Ridgel.  You will receive a reminder letter in the mail two months in advance. If you don't receive a letter, please call our office to schedule the follow-up appointment.  Remote monitoring is used to monitor your ICD from home. This monitoring reduces the number of office visits required to check your device to  one time per year. It allows [place] to keep an eye on the functioning of your device to ensure it is working properly. You are scheduled for a device check from home on 03/02/20. You may send your transmission at any time that day. If you have a wireless device, the transmission will be sent automatically. After your physician reviews your transmission, you will receive a postcard with your next transmission date.  Other Instructions:

## 2019-12-07 NOTE — Progress Notes (Signed)
HPI Joseph Carson returns today for followup. He is a pleasant 64 yo man with a  H/o VT, PAF, CAD, and ICD lead dysfunction s/p extraction and insertion of a new system. In the interim he notes that he has not been vaccinated. He denies chest pain or sob.  No Known Allergies   Current Outpatient Medications  Medication Sig Dispense Refill  . acetaminophen (TYLENOL) 325 MG tablet Take 1-2 tablets (325-650 mg total) by mouth every 4 (four) hours as needed for mild pain.    Marland Kitchen atorvastatin (LIPITOR) 40 MG tablet Take 1 tablet (40 mg total) by mouth daily at 6 PM. 90 tablet 3  . carvedilol (COREG) 6.25 MG tablet TAKE 2 TABLETS WITH FOOD TWICE DAILY 360 tablet 3  . dofetilide (TIKOSYN) 250 MCG capsule Take 1 capsule (250 mcg total) by mouth 2 (two) times daily. 1st attempt: MUST CALL [phone] TO SCHEDULE OVERDUE F/U TO CONTINUE REFILLS. Thank you. 60 capsule 0  . furosemide (LASIX) 40 MG tablet TAKE 1 TABLET BY MOUTH DAILY 90 tablet 1  . Multiple Vitamins-Minerals (HAIR/SKIN/NAILS PO) Take 1 tablet by mouth daily.    Marland Kitchen spironolactone (ALDACTONE) 25 MG tablet Take 1 tablet (25 mg total) by mouth daily. 90 tablet 3  . warfarin (COUMADIN) 5 MG tablet TAKE AS DIRECTED BY COUMADIN CLINIC. 90 tablet 0   No current facility-administered medications for this visit.     Past Medical History:  Diagnosis Date  . AICD (automatic cardioverter/defibrillator) present   . Atrial fibrillation, chronic (HCC)   . CAD (coronary artery disease)    a. s/p CABG x 3 03/2013 (L-LAD, S-RI, S-PDA) in [place]  . CAP (community acquired pneumonia) 08/22/2015  . CHF (congestive heart failure) (HCC)   . Chronic combined systolic and diastolic heart failure [facility]) November 2014  . Coronary artery disease   . Full dentures   . HLD (hyperlipidemia)    . Hyperbilirubinemia   . Ischemic cardiomyopathy    a. echo (10/15):  EF 20%, diff HK, apical AK, mobile density inf wall near papillary muscle (?ruptured) with mod  MR, mod reduced RVSF, mild PI, PASP 37 mmHg b. s/p CRTD   . LBBB (left bundle branch block)    . LV (left ventricular) mural thrombus     a. TEE (10/15):  EF 15-20%, septum and ant walls AK, lat wall HK, no papillary muscle rupture, probable small thrombus on ant/ant-septal wall, mild MR, mod decreased RVSF, neg bubble study  . Mitral regurgitation     mild by TEE 02/2014  . NSVT (nonsustained ventricular tachycardia) (HCC)        . Obesity   . Pulmonary hypertension (HCC)   . Umbilical hernia    "have had it since age [age]"  . Umbilical hernia   . Wears glasses     ROS:   All systems reviewed and negative except as noted in the HPI.   Past Surgical History:  Procedure Laterality Date  . BI-VENTRICULAR IMPLANTABLE CARDIOVERTER DEFIBRILLATOR N/A 09/01/2014   MDT CRTD implanted by Dr Ladona Ridgel  . CARDIAC CATHETERIZATION  03/2013  . CARDIOVERSION N/A 12/04/2017   Procedure: CARDIOVERSION;  Surgeon: Marinus Maw, MD;  Location: [facility] INVASIVE CV LAB;  Service: Cardiovascular;  Laterality: N/A;  . CORONARY ARTERY BYPASS GRAFT  03/2013   LIMA to the LAD, SVG to the ramus, SVG to the PDA  . CORONARY ARTERY BYPASS GRAFT  2014   CABG "X3"  . FRACTURE SURGERY    .  ICD LEAD REMOVAL N/A 08/14/2018   Procedure: ICD LEAD REMOVAL;  Surgeon: Marinus Maw, MD;  Location: [facility] OR;  Service: Cardiovascular;  Laterality: N/A;  . INSERT / REPLACE / REMOVE PACEMAKER  2016  . LEAD INSERTION N/A 08/14/2018   Procedure: LEAD INSERTION;  Surgeon: Marinus Maw, MD;  Location: [facility] OR;  Service: Cardiovascular;  Laterality: N/A;  . LEAD REVISION/REPAIR N/A 12/04/2017   Procedure: LEAD REVISION/REPAIR;  Surgeon: Marinus Maw, MD;  Location: [facility] INVASIVE CV LAB;  Service: Cardiovascular;  Laterality: N/A;  . MULTIPLE TOOTH EXTRACTIONS    . PATELLA FRACTURE SURGERY Left 1972  . TEE WITHOUT CARDIOVERSION N/A 03/04/2014   Procedure: TRANSESOPHAGEAL ECHOCARDIOGRAM (TEE);  Surgeon: Laurey Morale, MD;   Location: [facility] ENDOSCOPY;  Service: Cardiovascular;  Laterality: N/A;  . TEE WITHOUT CARDIOVERSION N/A 12/02/2017   Procedure: TRANSESOPHAGEAL ECHOCARDIOGRAM (TEE);  Surgeon: Jodelle Red, MD;  Location: [facility] ENDOSCOPY;  Service: Cardiovascular;  Laterality: N/A;     Family History  Problem Relation Age of Onset  . Diabetes Mother        Deceased  . Thyroid disease Mother      Social History   Socioeconomic History  . Marital status: Divorced    Spouse name: Not on file  . Number of children: Not on file  . Years of education: Not on file  . Highest education level: Not on file  Occupational History  . Not on file  Tobacco Use  . Smoking status: Current Every Day Smoker    Packs/day: 0.25    Years: 47.00    Pack years: 11.75    Types: Cigarettes  . Smokeless tobacco: Never Used  Vaping Use  . Vaping Use: Never used  Substance and Sexual Activity  . Alcohol use: Not Currently    Alcohol/week: 0.0 standard drinks    Comment: "quit drinking when I was 23"  . Drug use: Yes    Types: Marijuana    Comment:  "weekly" last use week of 08/07/2018  . Sexual activity: Not Currently  Other Topics Concern  . Not on file  Social History Narrative   ** Merged History Encounter **       Social Determinants of Health   Financial Resource Strain:   . Difficulty of Paying Living Expenses:   Food Insecurity:   . Worried About Programme researcher, broadcasting/film/video in the Last Year:   . Barista in the Last Year:   Transportation Needs:   . Freight forwarder (Medical):   Marland Kitchen Lack of Transportation (Non-Medical):   Physical Activity:   . Days of Exercise per Week:   . Minutes of Exercise per Session:   Stress:   . Feeling of Stress :   Social Connections:   . Frequency of Communication with Friends and Family:   . Frequency of Social Gatherings with Friends and Family:   . Attends Religious Services:   . Active Member of Clubs or Organizations:   . Attends Banker  Meetings:   Marland Kitchen Marital Status:   Intimate Partner Violence:   . Fear of Current or Ex-Partner:   . Emotionally Abused:   Marland Kitchen Physically Abused:   . Sexually Abused:      Ht 5\' 10"  (1.778 m)   Wt 216 lb (98 kg)   SpO2 95%   BMI 30.99 kg/m   Physical Exam:  Well appearing NAD HEENT: Unremarkable Neck:  No JVD, no thyromegally Lymphatics:  No adenopathy Back:  No CVA tenderness Lungs:  Clear with no wheezes HEART:  Regular rate rhythm, no murmurs, no rubs, no clicks Abd:  soft, positive bowel sounds, no organomegally, no rebound, no guarding Ext:  2 plus pulses, no edema, no cyanosis, no clubbing Skin:  No rashes no nodules Neuro:  CN II through XII intact, motor grossly intact  EKG - nsr with AV pacing  DEVICE  Normal device function.  See PaceArt for details.   Assess/Plan: 1. PAF - he is maintaining NSR on dofetilide. 2. ICD - his [employer] Biv ICD is working normally. We note some noise on his atrial lead. 3. Chronic systolic heart failure - his symptoms are class 2. He will continue his current meds. 4. CAD - he denies anginal symptoms. We will follow. 5. Covid 19 - he has not been vaccinated and I have strongly encouraged him to do so.  Leonia Reeves.D.

## 2019-12-24 ENCOUNTER — Other Ambulatory Visit: Payer: Self-pay | Admitting: Internal Medicine

## 2019-12-28 ENCOUNTER — Other Ambulatory Visit: Payer: Self-pay

## 2019-12-28 ENCOUNTER — Ambulatory Visit (INDEPENDENT_AMBULATORY_CARE_PROVIDER_SITE_OTHER): Payer: Medicare Other

## 2019-12-28 DIAGNOSIS — I513 Intracardiac thrombosis, not elsewhere classified: Secondary | ICD-10-CM

## 2019-12-28 DIAGNOSIS — Z5181 Encounter for therapeutic drug level monitoring: Secondary | ICD-10-CM

## 2019-12-28 LAB — POCT INR: INR: 2.9 (ref 2.0–3.0)

## 2019-12-28 NOTE — Patient Instructions (Signed)
continue on same dosage 1 tablet daily except 1/2 tablet on Sundays. Recheck INR in 8 weeks.  Call Coumadin Clinic if placed on any new medications or scheduled for any procedures - [phone]

## 2019-12-31 ENCOUNTER — Other Ambulatory Visit: Payer: Self-pay | Admitting: Internal Medicine

## 2020-01-12 ENCOUNTER — Other Ambulatory Visit: Payer: Self-pay | Admitting: Cardiovascular Disease

## 2020-01-21 ENCOUNTER — Other Ambulatory Visit: Payer: Self-pay | Admitting: Internal Medicine

## 2020-02-22 ENCOUNTER — Ambulatory Visit (INDEPENDENT_AMBULATORY_CARE_PROVIDER_SITE_OTHER): Payer: Medicare Other

## 2020-02-22 ENCOUNTER — Other Ambulatory Visit: Payer: Self-pay

## 2020-02-22 DIAGNOSIS — I513 Intracardiac thrombosis, not elsewhere classified: Secondary | ICD-10-CM

## 2020-02-22 DIAGNOSIS — Z5181 Encounter for therapeutic drug level monitoring: Secondary | ICD-10-CM

## 2020-02-22 LAB — POCT INR: INR: 4.2 — AB (ref 2.0–3.0)

## 2020-02-22 NOTE — Patient Instructions (Signed)
Hold today and then continue on same dosage 1 tablet daily except 1/2 tablet on Sundays. Recheck INR in 4 weeks.  Call Coumadin Clinic if placed on any new medications or scheduled for any procedures - [phone]

## 2020-03-02 ENCOUNTER — Ambulatory Visit (INDEPENDENT_AMBULATORY_CARE_PROVIDER_SITE_OTHER): Payer: Medicare Other

## 2020-03-02 DIAGNOSIS — I255 Ischemic cardiomyopathy: Secondary | ICD-10-CM | POA: Diagnosis not present

## 2020-03-02 LAB — CUP PACEART REMOTE DEVICE CHECK
Battery Remaining Longevity: 58 mo
Battery Voltage: 2.97 V
Brady Statistic AP VP Percent: 75.44 %
Brady Statistic AP VS Percent: 0.95 %
Brady Statistic AS VP Percent: 20.75 %
Brady Statistic AS VS Percent: 2.86 %
Brady Statistic RA Percent Paced: 75.16 %
Brady Statistic RV Percent Paced: 94.54 %
Date Time Interrogation Session: 20211014022603
HighPow Impedance: 85 Ohm
Implantable Lead Implant Date: 20160414
Implantable Lead Implant Date: 20160414
Implantable Lead Implant Date: 20200327
Implantable Lead Location: 753858
Implantable Lead Location: 753859
Implantable Lead Location: 753860
Implantable Lead Model: 4398
Implantable Lead Model: 5076
Implantable Lead Model: 6935
Implantable Pulse Generator Implant Date: 20200327
Lead Channel Impedance Value: 197.69 Ohm
Lead Channel Impedance Value: 208.578
Lead Channel Impedance Value: 208.578
Lead Channel Impedance Value: 231.878
Lead Channel Impedance Value: 231.878
Lead Channel Impedance Value: 342 Ohm
Lead Channel Impedance Value: 361 Ohm
Lead Channel Impedance Value: 437 Ohm
Lead Channel Impedance Value: 475 Ohm
Lead Channel Impedance Value: 494 Ohm
Lead Channel Impedance Value: 494 Ohm
Lead Channel Impedance Value: 494 Ohm
Lead Channel Impedance Value: 513 Ohm
Lead Channel Impedance Value: 722 Ohm
Lead Channel Impedance Value: 760 Ohm
Lead Channel Impedance Value: 760 Ohm
Lead Channel Impedance Value: 779 Ohm
Lead Channel Impedance Value: 798 Ohm
Lead Channel Pacing Threshold Amplitude: 0.625 V
Lead Channel Pacing Threshold Amplitude: 1.125 V
Lead Channel Pacing Threshold Amplitude: 2.75 V
Lead Channel Pacing Threshold Pulse Width: 0.4 ms
Lead Channel Pacing Threshold Pulse Width: 0.4 ms
Lead Channel Pacing Threshold Pulse Width: 0.8 ms
Lead Channel Sensing Intrinsic Amplitude: 2.25 mV
Lead Channel Sensing Intrinsic Amplitude: 2.25 mV
Lead Channel Sensing Intrinsic Amplitude: 20.375 mV
Lead Channel Sensing Intrinsic Amplitude: 20.375 mV
Lead Channel Setting Pacing Amplitude: 1.5 V
Lead Channel Setting Pacing Amplitude: 2.5 V
Lead Channel Setting Pacing Amplitude: 2.5 V
Lead Channel Setting Pacing Pulse Width: 0.4 ms
Lead Channel Setting Pacing Pulse Width: 0.8 ms
Lead Channel Setting Sensing Sensitivity: 0.3 mV

## 2020-03-07 NOTE — Progress Notes (Signed)
Remote ICD transmission.   

## 2020-03-10 ENCOUNTER — Telehealth: Payer: Self-pay | Admitting: Internal Medicine

## 2020-03-10 NOTE — Telephone Encounter (Signed)
Patient received letter from [employer].  Provided him with phone number for [employer] to follow-up.

## 2020-03-10 NOTE — Telephone Encounter (Signed)
Patient states that he got a letter from our office stating that they were going to send out a new SIMS card for security reasons out to the patient. He states he has yet to receive it. States he got the letter about a month and a half ago but lost it.

## 2020-03-21 ENCOUNTER — Other Ambulatory Visit: Payer: Self-pay

## 2020-03-21 ENCOUNTER — Ambulatory Visit (INDEPENDENT_AMBULATORY_CARE_PROVIDER_SITE_OTHER): Payer: Medicare Other

## 2020-03-21 DIAGNOSIS — Z5181 Encounter for therapeutic drug level monitoring: Secondary | ICD-10-CM | POA: Diagnosis not present

## 2020-03-21 DIAGNOSIS — I513 Intracardiac thrombosis, not elsewhere classified: Secondary | ICD-10-CM | POA: Diagnosis not present

## 2020-03-21 LAB — POCT INR: INR: 3.1 — AB (ref 2.0–3.0)

## 2020-03-21 NOTE — Patient Instructions (Signed)
continue on same dosage 1 tablet daily except 1/2 tablet on Sundays. Recheck INR in 6 weeks.  Call Coumadin Clinic if placed on any new medications or scheduled for any procedures - [phone]  Eat more greens over the next few days

## 2020-03-31 ENCOUNTER — Other Ambulatory Visit: Payer: Self-pay | Admitting: Internal Medicine

## 2020-05-02 ENCOUNTER — Other Ambulatory Visit: Payer: Self-pay

## 2020-05-02 ENCOUNTER — Ambulatory Visit (INDEPENDENT_AMBULATORY_CARE_PROVIDER_SITE_OTHER): Payer: Medicare Other

## 2020-05-02 DIAGNOSIS — I513 Intracardiac thrombosis, not elsewhere classified: Secondary | ICD-10-CM

## 2020-05-02 DIAGNOSIS — Z5181 Encounter for therapeutic drug level monitoring: Secondary | ICD-10-CM

## 2020-05-02 LAB — POCT INR: INR: 3.2 — AB (ref 2.0–3.0)

## 2020-05-02 NOTE — Patient Instructions (Signed)
Decrease dosage to 1 tablet daily except 1/2 tablet on Saturday and Sunday. Recheck INR in 6 weeks.  Call Coumadin Clinic if placed on any new medications or scheduled for any procedures - [phone]  Eat more greens over the next few days

## 2020-06-01 ENCOUNTER — Ambulatory Visit (INDEPENDENT_AMBULATORY_CARE_PROVIDER_SITE_OTHER): Payer: Medicare Other

## 2020-06-01 DIAGNOSIS — I255 Ischemic cardiomyopathy: Secondary | ICD-10-CM | POA: Diagnosis not present

## 2020-06-01 LAB — CUP PACEART REMOTE DEVICE CHECK
Battery Remaining Longevity: 54 mo
Battery Voltage: 2.97 V
Brady Statistic AP VP Percent: 71.02 %
Brady Statistic AP VS Percent: 0.85 %
Brady Statistic AS VP Percent: 26.07 %
Brady Statistic AS VS Percent: 2.06 %
Brady Statistic RA Percent Paced: 70.49 %
Brady Statistic RV Percent Paced: 95.08 %
Date Time Interrogation Session: 20220113001602
HighPow Impedance: 83 Ohm
Implantable Lead Implant Date: 20160414
Implantable Lead Implant Date: 20160414
Implantable Lead Implant Date: 20200327
Implantable Lead Location: 753858
Implantable Lead Location: 753859
Implantable Lead Location: 753860
Implantable Lead Model: 4398
Implantable Lead Model: 5076
Implantable Lead Model: 6935
Implantable Pulse Generator Implant Date: 20200327
Lead Channel Impedance Value: 216.848
Lead Channel Impedance Value: 216.848
Lead Channel Impedance Value: 220.723
Lead Channel Impedance Value: 237.5 Ohm
Lead Channel Impedance Value: 242.157
Lead Channel Impedance Value: 361 Ohm
Lead Channel Impedance Value: 399 Ohm
Lead Channel Impedance Value: 475 Ohm
Lead Channel Impedance Value: 475 Ohm
Lead Channel Impedance Value: 475 Ohm
Lead Channel Impedance Value: 494 Ohm
Lead Channel Impedance Value: 513 Ohm
Lead Channel Impedance Value: 551 Ohm
Lead Channel Impedance Value: 760 Ohm
Lead Channel Impedance Value: 760 Ohm
Lead Channel Impedance Value: 779 Ohm
Lead Channel Impedance Value: 798 Ohm
Lead Channel Impedance Value: 798 Ohm
Lead Channel Pacing Threshold Amplitude: 0.625 V
Lead Channel Pacing Threshold Amplitude: 1 V
Lead Channel Pacing Threshold Amplitude: 2.25 V
Lead Channel Pacing Threshold Pulse Width: 0.4 ms
Lead Channel Pacing Threshold Pulse Width: 0.4 ms
Lead Channel Pacing Threshold Pulse Width: 0.8 ms
Lead Channel Sensing Intrinsic Amplitude: 1 mV
Lead Channel Sensing Intrinsic Amplitude: 1 mV
Lead Channel Sensing Intrinsic Amplitude: 17 mV
Lead Channel Sensing Intrinsic Amplitude: 17 mV
Lead Channel Setting Pacing Amplitude: 1.5 V
Lead Channel Setting Pacing Amplitude: 2.5 V
Lead Channel Setting Pacing Amplitude: 2.5 V
Lead Channel Setting Pacing Pulse Width: 0.4 ms
Lead Channel Setting Pacing Pulse Width: 0.8 ms
Lead Channel Setting Sensing Sensitivity: 0.3 mV

## 2020-06-13 ENCOUNTER — Ambulatory Visit (INDEPENDENT_AMBULATORY_CARE_PROVIDER_SITE_OTHER): Payer: Medicare Other

## 2020-06-13 ENCOUNTER — Other Ambulatory Visit: Payer: Self-pay

## 2020-06-13 DIAGNOSIS — I513 Intracardiac thrombosis, not elsewhere classified: Secondary | ICD-10-CM

## 2020-06-13 DIAGNOSIS — Z5181 Encounter for therapeutic drug level monitoring: Secondary | ICD-10-CM

## 2020-06-13 LAB — POCT INR: INR: 2.1 (ref 2.0–3.0)

## 2020-06-13 NOTE — Patient Instructions (Signed)
Continue taking 1 tablet daily except 1/2 tablet on Saturday and Sunday. Recheck INR in 6 weeks.  Call Coumadin Clinic if placed on any new medications or scheduled for any procedures - [phone]

## 2020-06-14 NOTE — Progress Notes (Signed)
Remote ICD transmission.   

## 2020-07-04 ENCOUNTER — Other Ambulatory Visit: Payer: Self-pay | Admitting: Internal Medicine

## 2020-07-25 ENCOUNTER — Other Ambulatory Visit: Payer: Self-pay

## 2020-07-25 ENCOUNTER — Ambulatory Visit (INDEPENDENT_AMBULATORY_CARE_PROVIDER_SITE_OTHER): Payer: Medicare Other

## 2020-07-25 DIAGNOSIS — I513 Intracardiac thrombosis, not elsewhere classified: Secondary | ICD-10-CM

## 2020-07-25 DIAGNOSIS — Z5181 Encounter for therapeutic drug level monitoring: Secondary | ICD-10-CM

## 2020-07-25 LAB — POCT INR: INR: 2.2 (ref 2.0–3.0)

## 2020-07-25 NOTE — Patient Instructions (Signed)
Continue taking 1 tablet daily except 1/2 tablet on Saturday and Sunday. Recheck INR in 8 weeks.  Call Coumadin Clinic if placed on any new medications or scheduled for any procedures - [phone]

## 2020-08-31 ENCOUNTER — Ambulatory Visit (INDEPENDENT_AMBULATORY_CARE_PROVIDER_SITE_OTHER): Payer: Medicare Other

## 2020-08-31 DIAGNOSIS — I255 Ischemic cardiomyopathy: Secondary | ICD-10-CM | POA: Diagnosis not present

## 2020-08-31 DIAGNOSIS — I5022 Chronic systolic (congestive) heart failure: Secondary | ICD-10-CM

## 2020-08-31 LAB — CUP PACEART REMOTE DEVICE CHECK
Battery Remaining Longevity: 47 mo
Battery Voltage: 2.97 V
Brady Statistic AP VP Percent: 75.22 %
Brady Statistic AP VS Percent: 0.7 %
Brady Statistic AS VP Percent: 21.63 %
Brady Statistic AS VS Percent: 2.45 %
Brady Statistic RA Percent Paced: 74.65 %
Brady Statistic RV Percent Paced: 95.08 %
Date Time Interrogation Session: 20220414033523
HighPow Impedance: 92 Ohm
Implantable Lead Implant Date: 20160414
Implantable Lead Implant Date: 20160414
Implantable Lead Implant Date: 20200327
Implantable Lead Location: 753858
Implantable Lead Location: 753859
Implantable Lead Location: 753860
Implantable Lead Model: 4398
Implantable Lead Model: 5076
Implantable Lead Model: 6935
Implantable Pulse Generator Implant Date: 20200327
Lead Channel Impedance Value: 193.707
Lead Channel Impedance Value: 205.114
Lead Channel Impedance Value: 208.578
Lead Channel Impedance Value: 222.34 Ohm
Lead Channel Impedance Value: 226.417
Lead Channel Impedance Value: 342 Ohm
Lead Channel Impedance Value: 361 Ohm
Lead Channel Impedance Value: 418 Ohm
Lead Channel Impedance Value: 475 Ohm
Lead Channel Impedance Value: 475 Ohm
Lead Channel Impedance Value: 475 Ohm
Lead Channel Impedance Value: 494 Ohm
Lead Channel Impedance Value: 494 Ohm
Lead Channel Impedance Value: 703 Ohm
Lead Channel Impedance Value: 703 Ohm
Lead Channel Impedance Value: 760 Ohm
Lead Channel Impedance Value: 798 Ohm
Lead Channel Impedance Value: 798 Ohm
Lead Channel Pacing Threshold Amplitude: 0.625 V
Lead Channel Pacing Threshold Amplitude: 1.125 V
Lead Channel Pacing Threshold Amplitude: 2.375 V
Lead Channel Pacing Threshold Pulse Width: 0.4 ms
Lead Channel Pacing Threshold Pulse Width: 0.4 ms
Lead Channel Pacing Threshold Pulse Width: 0.8 ms
Lead Channel Sensing Intrinsic Amplitude: 0.375 mV
Lead Channel Sensing Intrinsic Amplitude: 0.375 mV
Lead Channel Sensing Intrinsic Amplitude: 21.875 mV
Lead Channel Sensing Intrinsic Amplitude: 21.875 mV
Lead Channel Setting Pacing Amplitude: 1.5 V
Lead Channel Setting Pacing Amplitude: 2.5 V
Lead Channel Setting Pacing Amplitude: 2.5 V
Lead Channel Setting Pacing Pulse Width: 0.4 ms
Lead Channel Setting Pacing Pulse Width: 0.8 ms
Lead Channel Setting Sensing Sensitivity: 0.3 mV

## 2020-09-14 NOTE — Progress Notes (Signed)
Remote ICD transmission.   

## 2020-09-19 ENCOUNTER — Other Ambulatory Visit: Payer: Self-pay

## 2020-09-19 ENCOUNTER — Ambulatory Visit (INDEPENDENT_AMBULATORY_CARE_PROVIDER_SITE_OTHER): Payer: Medicare Other

## 2020-09-19 DIAGNOSIS — Z5181 Encounter for therapeutic drug level monitoring: Secondary | ICD-10-CM

## 2020-09-19 DIAGNOSIS — I513 Intracardiac thrombosis, not elsewhere classified: Secondary | ICD-10-CM | POA: Diagnosis not present

## 2020-09-19 LAB — POCT INR: INR: 1.8 — AB (ref 2.0–3.0)

## 2020-09-19 NOTE — Patient Instructions (Signed)
Take 2 tablets tonight only and then Continue taking 1 tablet daily except 1/2 tablet on Saturday and Sunday. Recheck INR in 8 weeks.  Call Coumadin Clinic if placed on any new medications or scheduled for any procedures - [phone]

## 2020-09-29 ENCOUNTER — Telehealth: Payer: Self-pay

## 2020-09-29 MED ORDER — WARFARIN SODIUM 5 MG PO TABS: ORAL_TABLET | ORAL | 0 refills | Status: DC

## 2020-09-29 NOTE — Telephone Encounter (Signed)
90 day Rx sent as requested

## 2020-11-07 ENCOUNTER — Other Ambulatory Visit: Payer: Self-pay

## 2020-11-07 ENCOUNTER — Ambulatory Visit (INDEPENDENT_AMBULATORY_CARE_PROVIDER_SITE_OTHER): Payer: Medicare Other

## 2020-11-07 DIAGNOSIS — I513 Intracardiac thrombosis, not elsewhere classified: Secondary | ICD-10-CM

## 2020-11-07 DIAGNOSIS — Z5181 Encounter for therapeutic drug level monitoring: Secondary | ICD-10-CM | POA: Diagnosis not present

## 2020-11-07 LAB — POCT INR: INR: 2.8 (ref 2.0–3.0)

## 2020-11-07 NOTE — Patient Instructions (Signed)
Continue taking 1 tablet daily except 1/2 tablet on Saturday and Sunday. Recheck INR in 8 weeks.  Call Coumadin Clinic if placed on any new medications or scheduled for any procedures - [phone]

## 2020-11-30 ENCOUNTER — Ambulatory Visit (INDEPENDENT_AMBULATORY_CARE_PROVIDER_SITE_OTHER): Payer: Medicare Other

## 2020-11-30 ENCOUNTER — Inpatient Hospital Stay (HOSPITAL_COMMUNITY)
Admission: EM | Admit: 2020-11-30 | Discharge: 2020-12-03 | DRG: 177 | Disposition: A | Discharge status: Home / Self Care | Payer: Medicare Other | Attending: Internal Medicine | Admitting: Internal Medicine

## 2020-11-30 ENCOUNTER — Encounter (HOSPITAL_COMMUNITY): Payer: Self-pay

## 2020-11-30 ENCOUNTER — Telehealth: Payer: Self-pay

## 2020-11-30 ENCOUNTER — Emergency Department (HOSPITAL_COMMUNITY): Payer: Medicare Other

## 2020-11-30 ENCOUNTER — Other Ambulatory Visit: Payer: Self-pay

## 2020-11-30 DIAGNOSIS — I472 Ventricular tachycardia: Secondary | ICD-10-CM

## 2020-11-30 DIAGNOSIS — I5082 Biventricular heart failure: Secondary | ICD-10-CM | POA: Diagnosis present

## 2020-11-30 DIAGNOSIS — U071 COVID-19: Secondary | ICD-10-CM | POA: Diagnosis present

## 2020-11-30 DIAGNOSIS — Z8349 Family history of other endocrine, nutritional and metabolic diseases: Secondary | ICD-10-CM

## 2020-11-30 DIAGNOSIS — R7989 Other specified abnormal findings of blood chemistry: Secondary | ICD-10-CM

## 2020-11-30 DIAGNOSIS — I5042 Chronic combined systolic (congestive) and diastolic (congestive) heart failure: Secondary | ICD-10-CM

## 2020-11-30 DIAGNOSIS — Z4502 Encounter for adjustment and management of automatic implantable cardiac defibrillator: Secondary | ICD-10-CM | POA: Diagnosis present

## 2020-11-30 DIAGNOSIS — Z79899 Other long term (current) drug therapy: Secondary | ICD-10-CM | POA: Diagnosis not present

## 2020-11-30 DIAGNOSIS — I700 Atherosclerosis of aorta: Secondary | ICD-10-CM | POA: Diagnosis present

## 2020-11-30 DIAGNOSIS — J1282 Pneumonia due to coronavirus disease 2019: Secondary | ICD-10-CM | POA: Diagnosis present

## 2020-11-30 DIAGNOSIS — I4901 Ventricular fibrillation: Secondary | ICD-10-CM | POA: Diagnosis present

## 2020-11-30 DIAGNOSIS — Z833 Family history of diabetes mellitus: Secondary | ICD-10-CM

## 2020-11-30 DIAGNOSIS — I5022 Chronic systolic (congestive) heart failure: Secondary | ICD-10-CM | POA: Diagnosis present

## 2020-11-30 DIAGNOSIS — I272 Pulmonary hypertension, unspecified: Secondary | ICD-10-CM | POA: Diagnosis present

## 2020-11-30 DIAGNOSIS — F17210 Nicotine dependence, cigarettes, uncomplicated: Secondary | ICD-10-CM | POA: Diagnosis present

## 2020-11-30 DIAGNOSIS — I2510 Atherosclerotic heart disease of native coronary artery without angina pectoris: Secondary | ICD-10-CM | POA: Diagnosis present

## 2020-11-30 DIAGNOSIS — I255 Ischemic cardiomyopathy: Secondary | ICD-10-CM | POA: Diagnosis present

## 2020-11-30 DIAGNOSIS — Z28310 Unvaccinated for covid-19: Secondary | ICD-10-CM | POA: Diagnosis not present

## 2020-11-30 DIAGNOSIS — I480 Paroxysmal atrial fibrillation: Secondary | ICD-10-CM | POA: Diagnosis present

## 2020-11-30 DIAGNOSIS — I4729 Other ventricular tachycardia: Secondary | ICD-10-CM

## 2020-11-30 DIAGNOSIS — Z95810 Presence of automatic (implantable) cardiac defibrillator: Secondary | ICD-10-CM

## 2020-11-30 DIAGNOSIS — E785 Hyperlipidemia, unspecified: Secondary | ICD-10-CM | POA: Diagnosis present

## 2020-11-30 DIAGNOSIS — Z7901 Long term (current) use of anticoagulants: Secondary | ICD-10-CM

## 2020-11-30 DIAGNOSIS — Z951 Presence of aortocoronary bypass graft: Secondary | ICD-10-CM | POA: Diagnosis not present

## 2020-11-30 LAB — CUP PACEART REMOTE DEVICE CHECK
Battery Remaining Longevity: 45 mo
Brady Statistic AP VP Percent: 75.1 %
Brady Statistic AP VS Percent: 0.53 %
Brady Statistic AS VP Percent: 22.32 %
Brady Statistic AS VS Percent: 2.05 %
Brady Statistic RA Percent Paced: 73.13 %
Brady Statistic RV Percent Paced: 94.56 %
Date Time Interrogation Session: 20220713194543
HighPow Impedance: 84 Ohm
Implantable Lead Implant Date: 20160414
Implantable Lead Implant Date: 20160414
Implantable Lead Implant Date: 20200327
Implantable Lead Location: 753858
Implantable Lead Location: 753859
Implantable Lead Location: 753860
Implantable Lead Model: 4398
Implantable Lead Model: 5076
Implantable Lead Model: 6935
Implantable Pulse Generator Implant Date: 20200327
Lead Channel Impedance Value: 216.848
Lead Channel Impedance Value: 216.848
Lead Channel Impedance Value: 220.723
Lead Channel Impedance Value: 237.5 Ohm
Lead Channel Impedance Value: 242.157
Lead Channel Impedance Value: 342 Ohm
Lead Channel Impedance Value: 399 Ohm
Lead Channel Impedance Value: 437 Ohm
Lead Channel Impedance Value: 475 Ohm
Lead Channel Impedance Value: 475 Ohm
Lead Channel Impedance Value: 475 Ohm
Lead Channel Impedance Value: 494 Ohm
Lead Channel Impedance Value: 494 Ohm
Lead Channel Impedance Value: 722 Ohm
Lead Channel Impedance Value: 722 Ohm
Lead Channel Impedance Value: 760 Ohm
Lead Channel Impedance Value: 779 Ohm
Lead Channel Impedance Value: 798 Ohm
Lead Channel Pacing Threshold Amplitude: 0.5 V
Lead Channel Pacing Threshold Amplitude: 1 V
Lead Channel Pacing Threshold Amplitude: 4 V
Lead Channel Pacing Threshold Pulse Width: 0.4 ms
Lead Channel Pacing Threshold Pulse Width: 0.4 ms
Lead Channel Pacing Threshold Pulse Width: 0.8 ms
Lead Channel Sensing Intrinsic Amplitude: 1.5 mV
Lead Channel Sensing Intrinsic Amplitude: 1.5 mV
Lead Channel Sensing Intrinsic Amplitude: 18.875 mV
Lead Channel Sensing Intrinsic Amplitude: 18.875 mV
Lead Channel Setting Pacing Amplitude: 1.5 V
Lead Channel Setting Pacing Amplitude: 2.5 V
Lead Channel Setting Pacing Amplitude: 2.5 V
Lead Channel Setting Pacing Pulse Width: 0.4 ms
Lead Channel Setting Pacing Pulse Width: 0.8 ms
Lead Channel Setting Sensing Sensitivity: 0.3 mV

## 2020-11-30 LAB — CBC WITH DIFFERENTIAL/PLATELET
Abs Immature Granulocytes: 0.04 10*3/uL (ref 0.00–0.07)
Basophils Absolute: 0 10*3/uL (ref 0.0–0.1)
Basophils Relative: 0 %
Eosinophils Absolute: 0 10*3/uL (ref 0.0–0.5)
Eosinophils Relative: 0 %
HCT: 48.2 % (ref 39.0–52.0)
Hemoglobin: 16.1 g/dL (ref 13.0–17.0)
Immature Granulocytes: 0 %
Lymphocytes Relative: 13 %
Lymphs Abs: 1.3 10*3/uL (ref 0.7–4.0)
MCH: 29.8 pg (ref 26.0–34.0)
MCHC: 33.4 g/dL (ref 30.0–36.0)
MCV: 89.1 fL (ref 80.0–100.0)
Monocytes Absolute: 1.1 10*3/uL — ABNORMAL HIGH (ref 0.1–1.0)
Monocytes Relative: 11 %
Neutro Abs: 7.5 10*3/uL (ref 1.7–7.7)
Neutrophils Relative %: 76 %
Platelets: 212 10*3/uL (ref 150–400)
RBC: 5.41 MIL/uL (ref 4.22–5.81)
RDW: 13.3 % (ref 11.5–15.5)
WBC: 10 10*3/uL (ref 4.0–10.5)
nRBC: 0 % (ref 0.0–0.2)

## 2020-11-30 LAB — COMPREHENSIVE METABOLIC PANEL
ALT: 19 U/L (ref 0–44)
AST: 31 U/L (ref 15–41)
Albumin: 3.3 g/dL — ABNORMAL LOW (ref 3.5–5.0)
Alkaline Phosphatase: 57 U/L (ref 38–126)
Anion gap: 12 (ref 5–15)
BUN: 18 mg/dL (ref 8–23)
CO2: 24 mmol/L (ref 22–32)
Calcium: 8.9 mg/dL (ref 8.9–10.3)
Chloride: 99 mmol/L (ref 98–111)
Creatinine, Ser: 1.2 mg/dL (ref 0.61–1.24)
GFR, Estimated: >60 mL/min (ref >60)
Glucose, Bld: 107 mg/dL — ABNORMAL HIGH (ref 70–99)
Potassium: 3.7 mmol/L (ref 3.5–5.1)
Sodium: 135 mmol/L (ref 135–145)
Total Bilirubin: 1.8 mg/dL — ABNORMAL HIGH (ref 0.3–1.2)
Total Protein: 7.1 g/dL (ref 6.5–8.1)

## 2020-11-30 LAB — MAGNESIUM: Magnesium: 1.6 mg/dL — ABNORMAL LOW (ref 1.7–2.4)

## 2020-11-30 LAB — PROTIME-INR
INR: 3.6 — ABNORMAL HIGH (ref 0.8–1.2)
Prothrombin Time: 35.9 seconds — ABNORMAL HIGH (ref 11.4–15.2)

## 2020-11-30 LAB — TSH: TSH: 6.206 u[IU]/mL — ABNORMAL HIGH (ref 0.350–4.500)

## 2020-11-30 LAB — RESP PANEL BY RT-PCR (FLU A&B, COVID) ARPGX2
Influenza A by PCR: NEGATIVE
Influenza B by PCR: NEGATIVE
SARS Coronavirus 2 by RT PCR: POSITIVE — AB

## 2020-11-30 MED ORDER — ATORVASTATIN CALCIUM 40 MG PO TABS
40.0000 mg | ORAL_TABLET | Freq: Every day | ORAL | Status: DC
  Administered 2020-11-30 – 2020-12-03 (×4): 40 mg via ORAL
  Filled 2020-11-30 (×4): qty 1

## 2020-11-30 MED ORDER — CARVEDILOL 12.5 MG PO TABS
12.5000 mg | ORAL_TABLET | Freq: Two times a day (BID) | ORAL | Status: DC
  Administered 2020-12-01 – 2020-12-03 (×5): 12.5 mg via ORAL
  Filled 2020-11-30 (×5): qty 1

## 2020-11-30 MED ORDER — FUROSEMIDE 40 MG PO TABS
40.0000 mg | ORAL_TABLET | Freq: Every day | ORAL | Status: DC
  Administered 2020-11-30 – 2020-12-03 (×4): 40 mg via ORAL
  Filled 2020-11-30: qty 2
  Filled 2020-11-30 (×3): qty 1

## 2020-11-30 MED ORDER — MAGNESIUM SULFATE 2 GM/50ML IV SOLN
2.0000 g | Freq: Once | INTRAVENOUS | Status: AC
  Administered 2020-11-30: 2 g via INTRAVENOUS
  Filled 2020-11-30: qty 50

## 2020-11-30 MED ORDER — WARFARIN - PHARMACIST DOSING INPATIENT
Freq: Every day | Status: DC
  Administered 2020-12-02: 1

## 2020-11-30 MED ORDER — ACETAMINOPHEN 325 MG PO TABS
650.0000 mg | ORAL_TABLET | Freq: Four times a day (QID) | ORAL | Status: DC | PRN
  Administered 2020-12-01: 650 mg via ORAL
  Filled 2020-11-30: qty 2

## 2020-11-30 MED ORDER — NICOTINE 7 MG/24HR TD PT24
7.0000 mg | MEDICATED_PATCH | Freq: Every day | TRANSDERMAL | Status: DC
  Filled 2020-11-30 (×4): qty 1

## 2020-11-30 MED ORDER — SPIRONOLACTONE 25 MG PO TABS
25.0000 mg | ORAL_TABLET | Freq: Every day | ORAL | Status: DC
  Administered 2020-11-30 – 2020-12-03 (×4): 25 mg via ORAL
  Filled 2020-11-30 (×4): qty 1

## 2020-11-30 MED ORDER — ACETAMINOPHEN 650 MG RE SUPP: 650.0000 mg | Freq: Four times a day (QID) | RECTAL | Status: DC | PRN

## 2020-11-30 NOTE — Progress Notes (Signed)
ANTICOAGULATION CONSULT NOTE - Initial Consult  Pharmacy Consult for Warfarin Indication: atrial fibrillation  No Known Allergies  Patient Measurements:     Vital Signs: Temp: 98.1 F (36.7 C) (07/14 2142) Temp Source: Oral (07/14 2142) BP: 99/75 (07/14 2140) Pulse Rate: 69 (07/14 2142)  Labs: Recent Labs    11/30/20 1508  HGB 16.1  HCT 48.2  PLT 212  LABPROT 35.9*  INR 3.6*  CREATININE 1.20    CrCl cannot be calculated (Unknown ideal weight.).   Medical History: Past Medical History:  Diagnosis Date   AICD (automatic cardioverter/defibrillator) present    Atrial fibrillation, chronic (HCC)    CAD (coronary artery disease)    a. s/p CABG x 3 03/2013 (L-LAD, S-RI, S-PDA) in [place]   CAP (community acquired pneumonia) 08/22/2015   CHF (congestive heart failure) (HCC)    Chronic combined systolic and diastolic heart failure (HCC) November 2014   Coronary artery disease    Full dentures    HLD (hyperlipidemia)     Hyperbilirubinemia    Ischemic cardiomyopathy    a. echo (10/15):  EF 20%, diff HK, apical AK, mobile density inf wall near papillary muscle (?ruptured) with mod MR, mod reduced RVSF, mild PI, PASP 37 mmHg b. s/p CRTD    LBBB (left bundle branch block)     LV (left ventricular) mural thrombus     a. TEE (10/15):  EF 15-20%, septum and ant walls AK, lat wall HK, no papillary muscle rupture, probable small thrombus on ant/ant-septal wall, mild MR, mod decreased RVSF, neg bubble study   Mitral regurgitation     mild by TEE 02/2014   NSVT (nonsustained ventricular tachycardia) (HCC)         Obesity    Pulmonary hypertension (HCC)    Umbilical hernia    "have had it since age [age]"   Umbilical hernia    Wears glasses     Medications:  Warfarin  Assessment: 65 years of age male on Warfarin prior to admission for persistent atrial fibrillation being admitted here for multiple ICD shocks. Home regimen was 2.5 mg on Saturdays and Sundays and 5 mg all  other days. Last dose was on 11/29/20 per patient report.    INR on arrival is elevated at 3.6 (goal 2-3) - note patient had a therapeutic INR of 2.8 two weeks prior. H/H is within normal limits. Platelets are within normal limit. No bleeding reported.   Goal of Therapy:  INR 2-3 Monitor platelets by anticoagulation protocol: Yes   Plan:  Hold Warfarin tonight due to elevated INR.  Recheck PT/INR in AM.   Link Snuffer, PharmD, BCPS, BCCCP Clinical Pharmacist Please refer to [facility] for [facility] Pharmacy numbers 11/30/2020,10:04 PM

## 2020-11-30 NOTE — H&P (Addendum)
Date: 11/30/2020               Patient Name:  Joseph Carson MRN: [id number]  DOB: Mar 30, 1956 Age / Sex: 65 y.o., male   PCP: Patient, No Pcp Per (Inactive)         Medical Service: Internal Medicine Teaching Service         Attending Physician: Dr. Reymundo Poll, MD    First Contact: Dr. Alroy Bailiff Pager: [phone]  Second Contact: Dr. Barbaraann Faster Pager: [phone]       After Hours (After 5p/  First Contact Pager: [phone]  weekends / holidays): Second Contact Pager: [phone]   Chief Complaint: AICD shock  History of Present Illness: Joseph Carson is a 65 year-old with past medical history of Chronic systolic HF, ICD lead dysfunction s/p extraction and insertion of new system, paroxysmal atrial fibrillation, CAD s/p CABG (2014), and VT/ VF.  He called his cardiologist this morning (7/14) because his AICD went off 7/13 while he was sitting in his chair watching TV, when he felt the shocks as if something had hit him.  Alert from AICD was received for 7/13 for PMVT and VF, as well as twice for VT on 7/11.  He states that since 7/4 he has noticed increased fatigue and coughing up more phlegm (described as white and thickened) than usual.  Found to be COVID + in the ED, he is unvaccinated.  He is oxygenating well and states that he just thought the increased fatigue was from his heart.    He states that he has increased fatigue, cough with sputum production, and shortness of breath.   Denies chest pain, fever, chills, nausea, vomiting, and rhinorrhea.  DEVICE HISTORY: MDT BiV ICD implanted 2016, Gen change and lead extraction due to stim 08/17/2018 Echo 11/2017 LVEF 20-25%  Meds:  Current Meds  Medication Sig   acetaminophen (TYLENOL) 325 MG tablet Take 1-2 tablets (325-650 mg total) by mouth every 4 (four) hours as needed for mild pain.   atorvastatin (LIPITOR) 40 MG tablet TAKE 1 TABLET BY MOUTH DAILY AT 6 PM. (Patient taking differently: Take 40 mg by mouth daily.)   carvedilol (COREG)  6.25 MG tablet TAKE 2 TABLETS WITH FOOD TWICE DAILY (Patient taking differently: Take 12.5 mg by mouth 2 (two) times daily with a meal.)   dofetilide (TIKOSYN) 250 MCG capsule TAKE ONE CAPSULE BY MOUTH TWICE DAILY (Patient taking differently: Take 250 mcg by mouth 2 (two) times daily.)   furosemide (LASIX) 40 MG tablet TAKE 1 TABLET BY MOUTH DAILY (Patient taking differently: Take 40 mg by mouth daily.)   Multiple Vitamins-Minerals (HAIR/SKIN/NAILS PO) Take 1 tablet by mouth daily.   spironolactone (ALDACTONE) 25 MG tablet TAKE 1 TABLET BY MOUTH DAILY. (Patient taking differently: Take 25 mg by mouth daily.)   warfarin (COUMADIN) 5 MG tablet Take 1/2 to 1 tablet daily as directed by coumadin clinic. (Patient taking differently: Take 2.5-5 mg by mouth See admin instructions. 5mg  on Mon-fri  2.5mg  on Sat and Sun.)     Allergies: Allergies as of 11/30/2020   (No Known Allergies)   Past Medical History:  Diagnosis Date   AICD (automatic cardioverter/defibrillator) present    Atrial fibrillation, chronic (HCC)    CAD (coronary artery disease)    a. s/p CABG x 3 03/2013 (L-LAD, S-RI, S-PDA) [place]   CAP (community acquired pneumonia) 08/22/2015   CHF (congestive heart failure) (HCC)    Chronic combined systolic and diastolic heart failure (  [facility]) November 2014   Coronary artery disease    Full dentures    HLD (hyperlipidemia)     Hyperbilirubinemia    Ischemic cardiomyopathy    a. echo (10/15):  EF 20%, diff HK, apical AK, mobile density inf wall near papillary muscle (?ruptured) with mod MR, mod reduced RVSF, mild PI, PASP 37 mmHg b. s/p CRTD    LBBB (left bundle branch block)     LV (left ventricular) mural thrombus     a. TEE (10/15):  EF 15-20%, septum and ant walls AK, lat wall HK, no papillary muscle rupture, probable small thrombus on ant/ant-septal wall, mild MR, mod decreased RVSF, neg bubble study   Mitral regurgitation     mild by TEE 02/2014   NSVT (nonsustained ventricular  tachycardia) (HCC)         Obesity    Pulmonary hypertension (HCC)    Umbilical hernia    "have had it since age [age]"   Umbilical hernia    Wears glasses     Family History  Problem Relation Age of Onset   Diabetes Mother        Deceased   Thyroid disease Mother      Social History: patient is a current smoker, 1/4 a pack for 47 years.  He does not drink EtOH and occasionally uses marijuana.  He lives in [place]. His family, including his daughter and grandchildren live in [place], and he likes to visit them as often as possible.  Review of Systems: A complete ROS was negative except as per HPI.   Physical Exam: Blood pressure 99/75, pulse 69, temperature 98.1 F (36.7 C), temperature source Oral, resp. rate 18, SpO2 95 %.  GEN: Patient is ill-appearing, but well-nourished HENT: NCAT, mucous moist Eyes: sclera non-icteric, conjunctiva clear Heart: No murmurs, regular rate, no lower extremity edema, extremities warm Lungs: wheezing heard in exhalation and inhalation, pulmonary effort normal Abd: no tenderness to palpation, bowel sounds normal Extrm: no peripheral edema, pulses intact Skin: warm and dry, no rashes Psych: alert and oriented x3, normal mood and affect  EKG: shows A sensing V pacing at 79 bpm with LBBB like appearance. 1 pseudo-fusion beat   CXR:  IMPRESSION: Minimal enlargement of cardiac silhouette post ICD. No acute abnormalities. Aortic Atherosclerosis.  Assessment & Plan by Problem: Active Problems:   COVID-19  Joseph Carson is a 65 year-old with past medical history of Chronic systolic HF, ICD lead dysfunction s/p extraction and insertion of new system, paroxysmal atrial fibrillation, CAD s/p CABG (2014), and VT/ VF.  He called his cardiologist this morning (7/14) because his AICD went off 7/13 while he was sitting in his chair watching TV, when he felt the shocks as if something had hit him. Patient will be admitted due to episodes of ventricular  tachycardia and fibrillation, as well as monitored for further symptoms of COVID.  PMVT/VF--likely in setting of COVID 19 illness.  His device has detected three episodes- two on 7/11 and one 7/13.  Ischemic cardiomyopathy with biventricular heart failure with reduced EF 20-25% s/p CABG 2014, s/p AICD Euvolemic on exam.  Denies chest pain at this time. Paroxysmal atrial fibrillation (CHADS2-VASc 4)- on warfarin Chronic systolic Heart Failure-Euvolemic on exam Plan: Management per EP  -d/c tikosyn. Plan will be to allow for a 24-48h washout period, then to start amiodarone -warfarin per pharmacy -Continue coreg 6.25 mg BID, can titrate as tolerated.  -Continue BB and Cleda Daub, has previously failed ARB due to orthostasis- will  not tolerate entresto -Continue statin -tele monitoring, strict I/O, daily weights -monitor electrolytes. Maintain K 4-5, Mag >2 -continue statin -repeat echo  Elevated TSH. Check T4  Symptomatic COVID 19 infection Patient is unvaccinated and has been symptomatic since 7/4, tested +ve in ED 7/14.  He has had increased coughing with sputum production and fatigue.  Symptoms started around a month ago but have been more noticeable since 7/4.  He is maintaining his oxygen saturations on room air.  CXR looks ok. Will monitor him closely for further symptoms.  Extra stress on his body from COVID illness is the most likely contributing to his heart entering ventricular tachycardia/ fibrillation.    Chronic mild hyperbilirubinemia (1.8 on admission). AST/ALT/AP normal. Bili was 1.6 in 2019.  -may consider further evaluation after discharge  Aortic atherosclerosis Seen on CXR 7/14. Statin as noted above.   Diet: Heart healthy IVF: none Code: FULL VTE: warfarin (pharmacy consulted)  Dispo: Admit to Inpatient with expected length of stay greater than 2 midnights.   Signed: Rudene Christians, DO 11/30/2020, 9:45 PM  Pager: [phone] After 5pm on weekdays and 1pm on  weekends: On Call pager: [phone]

## 2020-11-30 NOTE — Progress Notes (Signed)
Called ED to inform them was do not have clean bed for room at this time and informed Chloe, RN that this nurse would notify her once bed was cleaned.

## 2020-11-30 NOTE — Telephone Encounter (Signed)
Spoke to A. Tillery, PA-C and recommends patient to go [facility] ED at the request of Dr. Lalla Brothers considering PMVT and taking Tikosyn. Called patient and notified. Advised NOT to drive. Patient is agreeable and verbalized understanding.

## 2020-11-30 NOTE — Telephone Encounter (Signed)
The patient received a successful ICD shock for torsades on 11/29/2020 and also had a successful ATP treatment for VT on 11/27/2020.  There were 5 NSVT arrhythmias detected and two atrial arrhythmias that both lasted less than one minute.    Patient reports over the past several days he has experienced a productive cough and increased fatigue. Denies fever, chills, chest pain, palpitations or dizziness. Has not taken a covid test, also not vaccinated. Reports yesterday he was laying in bed watching tv when he received a shock. States he felt like something hit him and felt like he was shocked. Reports of feeling fatigue prior to shock, no specific symptoms. Patient does not have PCP to follow up with about symptoms.   Reports compliance with medications including Coumadin 1/2 to 1 tablet daily, Tikosyn 250 mcg BID, Coreg 6.25 mg BID, Lasix 40 mg daily.   Forwarding to A. Tillery, PA-C for review.

## 2020-11-30 NOTE — ED Notes (Signed)
Attempted report 

## 2020-11-30 NOTE — ED Provider Notes (Signed)
[facility] EMERGENCY DEPARTMENT Provider Note   CSN: [id number] Arrival date & time: 11/30/20  1455     History Chief Complaint  Patient presents with   AICD Problem    Joseph Carson is a 65 y.o. male.  Presented to the emergency room with concern for AICD firing.  Patient reports AICD fired at home yesterday.  Torsades.  Also had ATP treatment for VT on 7/11.  Patient states that over the last few days has been feeling generally unwell, cough, productive with white sputum.  No chest pain or difficulty in breathing.  No fevers or chills.  Additional history obtained from chart review, reviewed telephone notes from cardiology clinic.  HPI     Past Medical History:  Diagnosis Date   AICD (automatic cardioverter/defibrillator) present    Atrial fibrillation, chronic (HCC)    CAD (coronary artery disease)    a. s/p CABG x 3 03/2013 (L-LAD, S-RI, S-PDA) in [place]   CAP (community acquired pneumonia) 08/22/2015   CHF (congestive heart failure) (HCC)    Chronic combined systolic and diastolic heart failure (HCC) November 2014   Coronary artery disease    Full dentures    HLD (hyperlipidemia)     Hyperbilirubinemia    Ischemic cardiomyopathy    a. echo (10/15):  EF 20%, diff HK, apical AK, mobile density inf wall near papillary muscle (?ruptured) with mod MR, mod reduced RVSF, mild PI, PASP 37 mmHg b. s/p CRTD    LBBB (left bundle branch block)     LV (left ventricular) mural thrombus     a. TEE (10/15):  EF 15-20%, septum and ant walls AK, lat wall HK, no papillary muscle rupture, probable small thrombus on ant/ant-septal wall, mild MR, mod decreased RVSF, neg bubble study   Mitral regurgitation     mild by TEE 02/2014   NSVT (nonsustained ventricular tachycardia) (HCC)         Obesity    Pulmonary hypertension (HCC)    Umbilical hernia    "have had it since age [age]"   Umbilical hernia    Wears glasses     Patient Active Problem List   Diagnosis  Date Noted   COVID-19 11/30/2020   Malfunction of implantable defibrillator ventricular (ICD) lead 08/14/2018   Visit for monitoring Tikosyn therapy 12/02/2017   Persistent atrial fibrillation (HCC)    Colon cancer screening 10/04/2015   CAP (community acquired pneumonia) 08/22/2015   Warfarin-induced coagulopathy (HCC) 08/22/2015   Biventricular automatic implantable cardioverter defibrillator in situ 09/01/2014   Encounter for therapeutic drug monitoring 08/25/2014   Chronic systolic CHF (congestive heart failure) (HCC) 03/10/2014   Mitral regurgitation 03/06/2014   LV (left ventricular) mural thrombus 03/06/2014   NSVT (nonsustained ventricular tachycardia) (HCC) 03/06/2014   LBBB (left bundle branch block) 03/06/2014   HLD (hyperlipidemia) 03/06/2014   Coronary artery disease 03/01/2014   Tobacco abuse 03/01/2014   Pulmonary hypertension (HCC) 03/01/2014    Past Surgical History:  Procedure Laterality Date   BI-VENTRICULAR IMPLANTABLE CARDIOVERTER DEFIBRILLATOR N/A 09/01/2014   MDT CRTD implanted by Dr Ladona Ridgel   CARDIAC CATHETERIZATION  03/2013   CARDIOVERSION N/A 12/04/2017   Procedure: CARDIOVERSION;  Surgeon: Marinus Maw, MD;  Location: [facility] INVASIVE CV LAB;  Service: Cardiovascular;  Laterality: N/A;   CORONARY ARTERY BYPASS GRAFT  03/2013   LIMA to the LAD, SVG to the ramus, SVG to the PDA   CORONARY ARTERY BYPASS GRAFT  2014   CABG "X3"   FRACTURE  SURGERY     ICD LEAD REMOVAL N/A 08/14/2018   Procedure: ICD LEAD REMOVAL;  Surgeon: Marinus Maw, MD;  Location: [facility] OR;  Service: Cardiovascular;  Laterality: N/A;   INSERT / REPLACE / REMOVE PACEMAKER  2016   LEAD INSERTION N/A 08/14/2018   Procedure: LEAD INSERTION;  Surgeon: Marinus Maw, MD;  Location: [facility] OR;  Service: Cardiovascular;  Laterality: N/A;   LEAD REVISION/REPAIR N/A 12/04/2017   Procedure: LEAD REVISION/REPAIR;  Surgeon: Marinus Maw, MD;  Location: [facility] INVASIVE CV LAB;  Service: Cardiovascular;   Laterality: N/A;   MULTIPLE TOOTH EXTRACTIONS     PATELLA FRACTURE SURGERY Left 1972   TEE WITHOUT CARDIOVERSION N/A 03/04/2014   Procedure: TRANSESOPHAGEAL ECHOCARDIOGRAM (TEE);  Surgeon: Laurey Morale, MD;  Location: [facility] ENDOSCOPY;  Service: Cardiovascular;  Laterality: N/A;   TEE WITHOUT CARDIOVERSION N/A 12/02/2017   Procedure: TRANSESOPHAGEAL ECHOCARDIOGRAM (TEE);  Surgeon: Jodelle Red, MD;  Location: [facility] ENDOSCOPY;  Service: Cardiovascular;  Laterality: N/A;       Family History  Problem Relation Age of Onset   Diabetes Mother        Deceased   Thyroid disease Mother     Social History   Tobacco Use   Smoking status: Every Day    Packs/day: 0.25    Years: 47.00    Pack years: 11.75    Types: Cigarettes   Smokeless tobacco: Never  Vaping Use   Vaping Use: Never used  Substance Use Topics   Alcohol use: Not Currently    Alcohol/week: 0.0 standard drinks    Comment: "quit drinking when I was 23"   Drug use: Yes    Types: Marijuana    Comment:  "weekly" last use week of 08/07/2018    Home Medications Prior to Admission medications   Medication Sig Start Date End Date Taking? Authorizing Provider  acetaminophen (TYLENOL) 325 MG tablet Take 1-2 tablets (325-650 mg total) by mouth every 4 (four) hours as needed for mild pain. 09/02/14  Yes Leone Brand, NP  atorvastatin (LIPITOR) 40 MG tablet TAKE 1 TABLET BY MOUTH DAILY AT 6 PM. Patient taking differently: Take 40 mg by mouth daily. 01/12/20  Yes Marinus Maw, MD  carvedilol (COREG) 6.25 MG tablet TAKE 2 TABLETS WITH FOOD TWICE DAILY Patient taking differently: Take 12.5 mg by mouth 2 (two) times daily with a meal. 01/12/20  Yes Marinus Maw, MD  dofetilide (TIKOSYN) 250 MCG capsule TAKE ONE CAPSULE BY MOUTH TWICE DAILY Patient taking differently: Take 250 mcg by mouth 2 (two) times daily. 01/21/20  Yes Marinus Maw, MD  furosemide (LASIX) 40 MG tablet TAKE 1 TABLET BY MOUTH DAILY Patient taking  differently: Take 40 mg by mouth daily. 12/24/19  Yes Marinus Maw, MD  Multiple Vitamins-Minerals (HAIR/SKIN/NAILS PO) Take 1 tablet by mouth daily.   Yes [provider]  spironolactone (ALDACTONE) 25 MG tablet TAKE 1 TABLET BY MOUTH DAILY. Patient taking differently: Take 25 mg by mouth daily. 01/12/20  Yes Marinus Maw, MD  warfarin (COUMADIN) 5 MG tablet Take 1/2 to 1 tablet daily as directed by coumadin clinic. Patient taking differently: Take 2.5-5 mg by mouth See admin instructions. 5mg  on Mon-fri  2.5mg  on Sat and Sun. 09/29/20  Yes 10/01/20, MD    Allergies    Patient has no known allergies.  Review of Systems   Review of Systems  Constitutional:  Positive for chills and fatigue. Negative for fever.  HENT:  Negative for ear pain and sore throat.   Eyes:  Negative for pain and visual disturbance.  Respiratory:  Positive for cough. Negative for shortness of breath.   Cardiovascular:  Negative for chest pain and palpitations.  Gastrointestinal:  Negative for abdominal pain and vomiting.  Genitourinary:  Negative for dysuria and hematuria.  Musculoskeletal:  Negative for arthralgias and back pain.  Skin:  Negative for color change and rash.  Neurological:  Positive for syncope. Negative for seizures.  All other systems reviewed and are negative.  Physical Exam Updated Vital Signs BP 93/65   Pulse 66   Temp 98.2 F (36.8 C) (Oral)   Resp 19   SpO2 98%   Physical Exam Vitals and nursing note reviewed.  Constitutional:      Appearance: He is well-developed.  HENT:     Head: Normocephalic and atraumatic.  Eyes:     Conjunctiva/sclera: Conjunctivae normal.  Cardiovascular:     Rate and Rhythm: Normal rate and regular rhythm.     Heart sounds: No murmur heard. Pulmonary:     Effort: Pulmonary effort is normal. No respiratory distress.     Breath sounds: Normal breath sounds.  Abdominal:     Palpations: Abdomen is soft.     Tenderness: There is no  abdominal tenderness.  Musculoskeletal:        General: No deformity or signs of injury.     Cervical back: Neck supple.  Skin:    General: Skin is warm and dry.  Neurological:     Mental Status: He is alert.    ED Results / Procedures / Treatments   Labs (all labs ordered are listed, but only abnormal results are displayed) Labs Reviewed  RESP PANEL BY RT-PCR (FLU A&B, COVID) ARPGX2 - Abnormal; Notable for the following components:      Result Value   SARS Coronavirus 2 by RT PCR POSITIVE (*)    All other components within normal limits  COMPREHENSIVE METABOLIC PANEL - Abnormal; Notable for the following components:   Glucose, Bld 107 (*)    Albumin 3.3 (*)    Total Bilirubin 1.8 (*)    All other components within normal limits  TSH - Abnormal; Notable for the following components:   TSH 6.206 (*)    All other components within normal limits  CBC WITH DIFFERENTIAL/PLATELET - Abnormal; Notable for the following components:   Monocytes Absolute 1.1 (*)    All other components within normal limits  MAGNESIUM - Abnormal; Notable for the following components:   Magnesium 1.6 (*)    All other components within normal limits  PROTIME-INR - Abnormal; Notable for the following components:   Prothrombin Time 35.9 (*)    INR 3.6 (*)    All other components within normal limits  T4, FREE  HIV ANTIBODY (ROUTINE TESTING W REFLEX)  CBC  COMPREHENSIVE METABOLIC PANEL    EKG None  Radiology DG Chest Portable 1 View  Result Date: 11/30/2020 CLINICAL DATA:  Defibrillator discharge 3 times in past week EXAM: PORTABLE CHEST 1 VIEW COMPARISON:  Portable exam 1547 hours compared to 11/27/2017 FINDINGS: LEFT subclavian ICD with leads projecting over RIGHT atrium and RIGHT ventricle. Enlargement of cardiac silhouette. Mediastinal contours and pulmonary vascularity normal. Atherosclerotic calcification aorta. Lungs clear. No infiltrate, pleural effusion, or pneumothorax. Mild osseous  demineralization. IMPRESSION: Minimal enlargement of cardiac silhouette post ICD. No acute abnormalities. Aortic Atherosclerosis (ICD10-I70.0). Electronically Signed   By: Ulyses Southward M.D.   On: 11/30/2020 16:15  CUP PACEART REMOTE DEVICE CHECK  Result Date: 11/30/2020 Scheduled remote reviewed. Normal device function.  The patient received a successful ICD shock for torsades on 11/29/2020 and also had a successful ATP treatment for VT on 11/27/2020.  There were 5 NSVT arrhythmias detected and two atrial arrhythmias that both lasted less than on.e minute.  Sent to triage Next remote 91 days. Hassell HalimMary Jo Lemke, RN, CCDS, [employer]   Procedures Procedures   Medications Ordered in ED Medications  atorvastatin (LIPITOR) tablet 40 mg (has no administration in time range)  carvedilol (COREG) tablet 12.5 mg (has no administration in time range)  furosemide (LASIX) tablet 40 mg (has no administration in time range)  spironolactone (ALDACTONE) tablet 25 mg (has no administration in time range)  acetaminophen (TYLENOL) tablet 650 mg (has no administration in time range)    Or  acetaminophen (TYLENOL) suppository 650 mg (has no administration in time range)  nicotine (NICODERM CQ - dosed in mg/24 hr) patch 7 mg (has no administration in time range)  magnesium sulfate IVPB 2 g 50 mL (0 g Intravenous Stopped 11/30/20 1845)    ED Course  I have reviewed the triage vital signs and the nursing notes.  Pertinent labs & imaging results that were available during my care of the patient were reviewed by me and considered in my medical decision making (see chart for details).    MDM Rules/Calculators/A&P                          65 year old presents to ER after his AICD fired yesterday.  Also having generalized fatigue.  Cardiology evaluated patient, requested medicine admission, they will likely make adjustments to his antiarrhythmic regimen.  Patient currently appears stable.  Vitals within normal  limits.  Does have general fatigue, cough.  COVID-positive.  Consulted medicine for admit.   Final Clinical Impression(s) / ED Diagnoses Final diagnoses:  AICD discharge  COVID-19    Rx / DC Orders ED Discharge Orders     None        Milagros Lollykstra, Elaysia Devargas S, MD 11/30/20 2130

## 2020-11-30 NOTE — Consult Note (Signed)
ELECTROPHYSIOLOGY CONSULT NOTE    Patient ID: Joseph Carson MRN: [id number], DOB/AGE: 10-20-1955 65 y.o.  Admit date: 11/30/2020 Date of Consult: 11/30/2020  Primary Physician: Patient, No Pcp Per (Inactive) Primary Cardiologist: None  Electrophysiologist: Dr. Ladona Ridgel  Referring Provider: Dr. Stevie Kern  Patient Profile: Joseph Carson is a 65 y.o. male with a history of PAF, CAD, VT/VF, chronic systolic HF, and h/o ICD lead dysfunction s/p extraction and insertion of a new system who is being seen today for the evaluation of ICD shocks at the request of Dr. Stevie Kern.  HPI:  Joseph Carson is a 65 y.o. male with medical history as above.  Alert received for ICD shock on 7/13 for PMVT and VF; Also previously two ATP treatments for VT on 7/11.   Pt reported productive cough and increased fatigue. He denies fever, chest discomfort, palpitation, or dizziness. Was sitting watching TV when he felt the shocks, feeling like something had hit him.   Reported compliance with all of his medications including Tikosyn.   With PMVT/VF on Tikosyn pt instructed to report to ED for further work-up given.   Currently, pt is asymptomatic at rest. He repeats the history above. He does endorse malaise and productive cough over the last 10-14 days. He reports intermittent chills. Denies fever. He has not had a COVID test but has been socially distancing. He is not vaccinated.   Past Medical History:  Diagnosis Date   AICD (automatic cardioverter/defibrillator) present    Atrial fibrillation, chronic (HCC)    CAD (coronary artery disease)    a. s/p CABG x 3 03/2013 (L-LAD, S-RI, S-PDA) in [place]   CAP (community acquired pneumonia) 08/22/2015   CHF (congestive heart failure) (HCC)    Chronic combined systolic and diastolic heart failure (HCC) November 2014   Coronary artery disease    Full dentures    HLD (hyperlipidemia)     Hyperbilirubinemia    Ischemic cardiomyopathy    a. echo  (10/15):  EF 20%, diff HK, apical AK, mobile density inf wall near papillary muscle (?ruptured) with mod MR, mod reduced RVSF, mild PI, PASP 37 mmHg b. s/p CRTD    LBBB (left bundle branch block)     LV (left ventricular) mural thrombus     a. TEE (10/15):  EF 15-20%, septum and ant walls AK, lat wall HK, no papillary muscle rupture, probable small thrombus on ant/ant-septal wall, mild MR, mod decreased RVSF, neg bubble study   Mitral regurgitation     mild by TEE 02/2014   NSVT (nonsustained ventricular tachycardia) (HCC)         Obesity    Pulmonary hypertension (HCC)    Umbilical hernia    "have had it since age [age]"   Umbilical hernia    Wears glasses      Surgical History:  Past Surgical History:  Procedure Laterality Date   BI-VENTRICULAR IMPLANTABLE CARDIOVERTER DEFIBRILLATOR N/A 09/01/2014   MDT CRTD implanted by Dr Ladona Ridgel   CARDIAC CATHETERIZATION  03/2013   CARDIOVERSION N/A 12/04/2017   Procedure: CARDIOVERSION;  Surgeon: Marinus Maw, MD;  Location: [facility] INVASIVE CV LAB;  Service: Cardiovascular;  Laterality: N/A;   CORONARY ARTERY BYPASS GRAFT  03/2013   LIMA to the LAD, SVG to the ramus, SVG to the PDA   CORONARY ARTERY BYPASS GRAFT  2014   CABG "X3"   FRACTURE SURGERY     ICD LEAD REMOVAL N/A 08/14/2018   Procedure: ICD LEAD REMOVAL;  Surgeon: Ladona Ridgel,  Doylene Canning, MD;  Location: [facility] OR;  Service: Cardiovascular;  Laterality: N/A;   INSERT / REPLACE / REMOVE PACEMAKER  2016   LEAD INSERTION N/A 08/14/2018   Procedure: LEAD INSERTION;  Surgeon: Marinus Maw, MD;  Location: [facility] OR;  Service: Cardiovascular;  Laterality: N/A;   LEAD REVISION/REPAIR N/A 12/04/2017   Procedure: LEAD REVISION/REPAIR;  Surgeon: Marinus Maw, MD;  Location: [facility] INVASIVE CV LAB;  Service: Cardiovascular;  Laterality: N/A;   MULTIPLE TOOTH EXTRACTIONS     PATELLA FRACTURE SURGERY Left 1972   TEE WITHOUT CARDIOVERSION N/A 03/04/2014   Procedure: TRANSESOPHAGEAL ECHOCARDIOGRAM (TEE);  Surgeon:  Laurey Morale, MD;  Location: [facility] ENDOSCOPY;  Service: Cardiovascular;  Laterality: N/A;   TEE WITHOUT CARDIOVERSION N/A 12/02/2017   Procedure: TRANSESOPHAGEAL ECHOCARDIOGRAM (TEE);  Surgeon: Jodelle Red, MD;  Location: [facility] ENDOSCOPY;  Service: Cardiovascular;  Laterality: N/A;     (Not in a hospital admission)   Inpatient Medications:   Allergies: No Known Allergies  Social History   Socioeconomic History   Marital status: Divorced    Spouse name: Not on file   Number of children: Not on file   Years of education: Not on file   Highest education level: Not on file  Occupational History   Not on file  Tobacco Use   Smoking status: Every Day    Packs/day: 0.25    Years: 47.00    Pack years: 11.75    Types: Cigarettes   Smokeless tobacco: Never  Vaping Use   Vaping Use: Never used  Substance and Sexual Activity   Alcohol use: Not Currently    Alcohol/week: 0.0 standard drinks    Comment: "quit drinking when I was 23"   Drug use: Yes    Types: Marijuana    Comment:  "weekly" last use week of 08/07/2018   Sexual activity: Not Currently  Other Topics Concern   Not on file  Social History Narrative   ** Merged History Encounter **       Social Determinants of Health   Financial Resource Strain: Not on file  Food Insecurity: Not on file  Transportation Needs: Not on file  Physical Activity: Not on file  Stress: Not on file  Social Connections: Not on file  Intimate Partner Violence: Not on file     Family History  Problem Relation Age of Onset   Diabetes Mother        Deceased   Thyroid disease Mother      Review of Systems: All other systems reviewed and are otherwise negative except as noted above.  Physical Exam: Vitals:   11/30/20 1459 11/30/20 1537 11/30/20 1541  BP: 117/87 110/84   Pulse: (!) 57 (!) 39 71  Resp: (!) 24 19 18   Temp: 98.2 F (36.8 C)    TempSrc: Oral    SpO2: 95% 95% 100%   Limited physical exam as pt is pending  COVID test with concerning symptoms.   GEN- The patient is unkempt and fatigue appearing, alert and oriented x 3 today.   HEENT: normocephalic, atraumatic; sclera clear, conjunctiva pink; hearing intact; oropharynx clear; neck supple Lungs: Normal respiratory effort with intermittent cough.  GI- somewhat obese. Umbilical hernia noted.  Extremities- Moves all 4 without difficulty.  Psych- euthymic mood, full affect  Labs:   Lab Results  Component Value Date   WBC 10.5 08/05/2018   HGB 13.6 08/14/2018   HCT 40.0 08/14/2018   MCV 90 08/05/2018   PLT 211 08/05/2018  No results for input(s): NA, K, CL, CO2, BUN, CREATININE, CALCIUM, PROT, BILITOT, ALKPHOS, ALT, AST, GLUCOSE in the last 168 hours.  Invalid input(s): LABALBU    Radiology/Studies: CUP PACEART REMOTE DEVICE CHECK  Result Date: 11/30/2020 Scheduled remote reviewed. Normal device function.  The patient received a successful ICD shock for torsades on 11/29/2020 and also had a successful ATP treatment for VT on 11/27/2020.  There were 5 NSVT arrhythmias detected and two atrial arrhythmias that both lasted less than on.e minute.  Sent to triage Next remote 91 days. Hassell Halim, RN, CCDS, [employer]   EKG: shows A sensing V pacing at 79 bpm with LBBB like appearance. 1 pseudo-fusion beat (personally reviewed)  TELEMETRY: Not yet connected (personally reviewed)  DEVICE HISTORY: MDT BiV ICD implanted 2016, Gen change and lead extraction due to stim 08/17/2018  Assessment/Plan: 1.  PMVT/VF In setting of non-specific URI illness and on Tikosyn.  Would STOP tikosyn. Let wash out for 24-48 hours than transition to amiodarone.  Continue coreg 6.25 mg BID. Can titrate as tolerated.   2. PAF STOP tikosyn with now several episodes of VF/PMVT.  Will plan to start amiodarone in 24-48 hours.  On coumadin for CHA2DS2/VASc of at least 3. Electrolytes pending.    3. Chronic systolic CHF, ICM Update echo.  Echo 11/2017 LVEF  20-25% Volume status looks OK on exam Continue BB and spiro He has previously failed ARB due to orthostasis. Would not likely tolerate Entresto.   4. URI 1-2 weeks malaise, productive cough, chills Labs and COVID test pending.    5. CAD s/p CABG 2014 He denies anginal symptoms. Continue statin.  No ASA with stable disease on coumadin.   EP will follow along with you. Plan to STOP tikosyn and consider amiodarone in 24-48 hours.   For questions or updates, please contact [facility] Please consult [email or web address] for contact info under Cardiology/STEMI.  Broderic, Bara, PA-C  11/30/2020 3:54 PM

## 2020-11-30 NOTE — Plan of Care (Signed)

## 2020-11-30 NOTE — ED Triage Notes (Signed)
Pt reports his defibrillator has fired about 3 times in the past week but the first time it fired the office ?reset it. Pt denies chest pain but c.o feeling weak all over. HR 50s in triage. [employer] ICD

## 2020-11-30 NOTE — ED Notes (Signed)
3E called back this RN stated the bed in the room has not been cleaned yet, will call again once  the bed is cleaned.

## 2020-12-01 ENCOUNTER — Inpatient Hospital Stay (HOSPITAL_COMMUNITY): Payer: Medicare Other

## 2020-12-01 DIAGNOSIS — I4901 Ventricular fibrillation: Secondary | ICD-10-CM

## 2020-12-01 DIAGNOSIS — U071 COVID-19: Principal | ICD-10-CM

## 2020-12-01 DIAGNOSIS — R7989 Other specified abnormal findings of blood chemistry: Secondary | ICD-10-CM | POA: Diagnosis not present

## 2020-12-01 DIAGNOSIS — Z4502 Encounter for adjustment and management of automatic implantable cardiac defibrillator: Secondary | ICD-10-CM

## 2020-12-01 DIAGNOSIS — I472 Ventricular tachycardia: Secondary | ICD-10-CM

## 2020-12-01 LAB — CBC
HCT: 44.9 % (ref 39.0–52.0)
Hemoglobin: 15.1 g/dL (ref 13.0–17.0)
MCH: 29.6 pg (ref 26.0–34.0)
MCHC: 33.6 g/dL (ref 30.0–36.0)
MCV: 88 fL (ref 80.0–100.0)
Platelets: 227 10*3/uL (ref 150–400)
RBC: 5.1 MIL/uL (ref 4.22–5.81)
RDW: 13.3 % (ref 11.5–15.5)
WBC: 9 10*3/uL (ref 4.0–10.5)
nRBC: 0 % (ref 0.0–0.2)

## 2020-12-01 LAB — PROTIME-INR
INR: 3.9 — ABNORMAL HIGH (ref 0.8–1.2)
Prothrombin Time: 38.2 seconds — ABNORMAL HIGH (ref 11.4–15.2)

## 2020-12-01 LAB — COMPREHENSIVE METABOLIC PANEL
ALT: 21 U/L (ref 0–44)
AST: 31 U/L (ref 15–41)
Albumin: 3 g/dL — ABNORMAL LOW (ref 3.5–5.0)
Alkaline Phosphatase: 51 U/L (ref 38–126)
Anion gap: 7 (ref 5–15)
BUN: 19 mg/dL (ref 8–23)
CO2: 27 mmol/L (ref 22–32)
Calcium: 8.7 mg/dL — ABNORMAL LOW (ref 8.9–10.3)
Chloride: 99 mmol/L (ref 98–111)
Creatinine, Ser: 1.17 mg/dL (ref 0.61–1.24)
GFR, Estimated: >60 mL/min (ref >60)
Glucose, Bld: 123 mg/dL — ABNORMAL HIGH (ref 70–99)
Potassium: 3.5 mmol/L (ref 3.5–5.1)
Sodium: 133 mmol/L — ABNORMAL LOW (ref 135–145)
Total Bilirubin: 1.2 mg/dL (ref 0.3–1.2)
Total Protein: 6.7 g/dL (ref 6.5–8.1)

## 2020-12-01 LAB — HIV ANTIBODY (ROUTINE TESTING W REFLEX): HIV Screen 4th Generation wRfx: NONREACTIVE

## 2020-12-01 LAB — T4, FREE: Free T4: 1.43 ng/dL — ABNORMAL HIGH (ref 0.61–1.12)

## 2020-12-01 MED ORDER — POTASSIUM CHLORIDE CRYS ER 20 MEQ PO TBCR
40.0000 meq | EXTENDED_RELEASE_TABLET | Freq: Once | ORAL | Status: AC
  Administered 2020-12-01: 40 meq via ORAL
  Filled 2020-12-01: qty 2

## 2020-12-01 MED ORDER — MAGNESIUM SULFATE 2 GM/50ML IV SOLN
2.0000 g | Freq: Once | INTRAVENOUS | Status: AC
  Administered 2020-12-01: 2 g via INTRAVENOUS
  Filled 2020-12-01: qty 50

## 2020-12-01 MED ORDER — AMIODARONE HCL 200 MG PO TABS
400.0000 mg | ORAL_TABLET | Freq: Two times a day (BID) | ORAL | Status: DC
  Administered 2020-12-02 – 2020-12-03 (×3): 400 mg via ORAL
  Filled 2020-12-01 (×4): qty 2

## 2020-12-01 NOTE — Plan of Care (Signed)

## 2020-12-01 NOTE — Plan of Care (Signed)

## 2020-12-01 NOTE — Progress Notes (Signed)
Electrophysiology Rounding Note  Patient Name: Joseph Carson Date of Encounter: 12/01/2020  Primary Cardiologist: None Electrophysiologist: Lewayne Bunting, MD   Subjective   The patient is doing OK today.  At this time, the patient denies chest pain, shortness of breath, or any new concerns.  Inpatient Medications    Scheduled Meds:  [START ON 12/02/2020] amiodarone  400 mg Oral BID   atorvastatin  40 mg Oral Daily   carvedilol  12.5 mg Oral BID WC   furosemide  40 mg Oral Daily   nicotine  7 mg Transdermal Daily   potassium chloride  40 mEq Oral Once   spironolactone  25 mg Oral Daily   Warfarin - Pharmacist Dosing Inpatient   Does not apply q1600   Continuous Infusions:  magnesium sulfate bolus IVPB     PRN Meds: acetaminophen **OR** acetaminophen   Vital Signs    Vitals:   12/01/20 0031 12/01/20 0514 12/01/20 0531 12/01/20 0735  BP: (!) 102/54 93/80 (!) 99/51 (!) 99/55  Pulse: 86 76 68 78  Resp: (!) 22 (!) 22 (!) 22 20  Temp: 98.6 F (37 C) 98.8 F (37.1 C)  97.9 F (36.6 C)  TempSrc: Oral Oral  Oral  SpO2: 91% 93%  92%  Weight:  88.8 kg      Intake/Output Summary (Last 24 hours) at 12/01/2020 0810 Last data filed at 12/01/2020 0700 Gross per 24 hour  Intake --  Output 312 ml  Net -312 ml   Filed Weights   12/01/20 0514  Weight: 88.8 kg    Physical Exam    GEN- The patient is well appearing, alert and oriented x 3 today.   Head- normocephalic, atraumatic Eyes-  Sclera clear, conjunctiva pink Ears- hearing intact Oropharynx- clear Neck- supple Lungs- Clear to ausculation bilaterally, normal work of breathing Heart- Regular rate and rhythm, no murmurs, rubs or gallops GI- soft, NT, ND, + BS Extremities- no clubbing or cyanosis. No edema Skin- no rash or lesion Psych- euthymic mood, full affect Neuro- strength and sensation are intact  Labs    CBC Recent Labs    11/30/20 1508 12/01/20 0311  WBC 10.0 9.0  NEUTROABS 7.5  --   HGB  16.1 15.1  HCT 48.2 44.9  MCV 89.1 88.0  PLT 212 227   Basic Metabolic Panel Recent Labs    27/25/36 1508 12/01/20 0311  NA 135 133*  K 3.7 3.5  CL 99 99  CO2 24 27  GLUCOSE 107* 123*  BUN 18 19  CREATININE 1.20 1.17  CALCIUM 8.9 8.7*  MG 1.6*  --    Liver Function Tests Recent Labs    11/30/20 1508 12/01/20 0311  AST 31 31  ALT 19 21  ALKPHOS 57 51  BILITOT 1.8* 1.2  PROT 7.1 6.7  ALBUMIN 3.3* 3.0*   No results for input(s): LIPASE, AMYLASE in the last 72 hours. Cardiac Enzymes No results for input(s): CKTOTAL, CKMB, CKMBINDEX, TROPONINI in the last 72 hours.   Telemetry    NSR 60-80s, BiV pacing, NSVT noted. (personally reviewed)  Radiology    DG Chest Portable 1 View  Result Date: 11/30/2020 CLINICAL DATA:  Defibrillator discharge 3 times in past week EXAM: PORTABLE CHEST 1 VIEW COMPARISON:  Portable exam 1547 hours compared to 11/27/2017 FINDINGS: LEFT subclavian ICD with leads projecting over RIGHT atrium and RIGHT ventricle. Enlargement of cardiac silhouette. Mediastinal contours and pulmonary vascularity normal. Atherosclerotic calcification aorta. Lungs clear. No infiltrate, pleural effusion, or pneumothorax.  Mild osseous demineralization. IMPRESSION: Minimal enlargement of cardiac silhouette post ICD. No acute abnormalities. Aortic Atherosclerosis (ICD10-I70.0). Electronically Signed   By: Ulyses Southward M.D.   On: 11/30/2020 16:15   CUP PACEART REMOTE DEVICE CHECK  Result Date: 11/30/2020 Scheduled remote reviewed. Normal device function.  The patient received a successful ICD shock for torsades on 11/29/2020 and also had a successful ATP treatment for VT on 11/27/2020.  There were 5 NSVT arrhythmias detected and two atrial arrhythmias that both lasted less than on.e minute.  Sent to triage Next remote 91 days. Hassell Halim, RN, CCDS, [employer]   Patient Profile     Joseph Carson is a 65 y.o. male with a history of PAF, CAD, VT/VF,  chronic systolic HF, and h/o ICD lead dysfunction s/p extraction and insertion of a new system who is being seen today for the evaluation of ICD shocks at the request of Dr. Stevie Kern. Found to be COVID +.     Assessment & Plan    COVID + Suspect this has led to his gradual decline over the past week+ and ultimately to his ICD shock.  Appreciate IM care  2. PMVT/VF Tikosyn has been stopped with several episodes of PMVT Keep K > 4.0 and Mg > 2.0  Will plan to start amiodarone tomorrow with taper over the next several weeks.  [place] guidelines of no driving x 6 months has been reviewed with the patient.   3. Chronic systolic CHF Update echo pending Volume status OK Continue BB and spiro.  Has not tolerated ACE/ARB  4. CAD s/p CABG 2014 Denies anginal symptoms Continue statin No ASA with stable disease on coumadin.  For questions or updates, please contact [facility] Please consult [email or web address] for contact info under Cardiology/STEMI.  Signed, Graciella Freer, PA-C  12/01/2020, 8:10 AM

## 2020-12-01 NOTE — Progress Notes (Signed)
ANTICOAGULATION CONSULT NOTE - Follow Up Consult  Pharmacy Consult for Warfarin Indication: atrial fibrillation  No Known Allergies  Patient Measurements: Height: 5\' 10"  (177.8 cm) Weight: 88.8 kg (195 lb 12.3 oz) IBW/kg (Calculated) : 73  Vital Signs: Temp: 97.9 F (36.6 C) (07/15 0735) Temp Source: Oral (07/15 0735) BP: 99/55 (07/15 0735) Pulse Rate: 78 (07/15 0735)  Labs: Recent Labs    11/30/20 1508 12/01/20 0311  HGB 16.1 15.1  HCT 48.2 44.9  PLT 212 227  LABPROT 35.9* 38.2*  INR 3.6* 3.9*  CREATININE 1.20 1.17    Estimated Creatinine Clearance: 70.6 mL/min (by C-G formula based on SCr of 1.17 mg/dL).  Assessment: 65 yr old male on Warfarin prior to admission for persistent atrial fibrillation admitted 11/30/20 for multiple ICD shocks. Home warfarin regimen was 2.5 mg on Saturdays and Sundays and 5 mg all other days. Last dose was on 11/29/20 per patient report.  Last outpatient INR 2.8 on 11/07/20, at goal.   INR supratherapeutic (3.6) on admit and up to 3.9 today.   Amiodarone to begin 7/16, which is likely to increase sensitivity to warfarin.   Goal of Therapy:  INR 2-3 Monitor platelets by anticoagulation protocol: Yes   Plan:   No Warfarin again today.  Daily PT/INR.  Amiodarone to begin 7/16; will need close monitoring over the next several weeks.   8/16, RPh 12/01/2020,11:08 AM

## 2020-12-01 NOTE — Progress Notes (Addendum)
Subjective:  Overnight: No acute events or concerns  Patient seen and examined on rounds. Patient feels well this morning. He endorses sputum production, cough. Denies SOB, flu like symptoms, diarrhea, abdominal pain, chest pain. We discussed patient's thyroid labs and his hair/nails vitamin use and the interactions between biotin and thyroid studies.  Patient has had no further ICD shocks.  Objective:  Vital signs in last 24 hours: Vitals:   12/01/20 0514 12/01/20 0531 12/01/20 0735 12/01/20 1100  BP: 93/80 (!) 99/51 (!) 99/55   Pulse: 76 68 78   Resp: (!) 22 (!) 22 20   Temp: 98.8 F (37.1 C)  97.9 F (36.6 C)   TempSrc: Oral  Oral   SpO2: 93%  92%   Weight: 88.8 kg     Height:    5\' 10"  (1.778 m)    Intake/Output Summary (Last 24 hours) at 12/01/2020 1207 Last data filed at 12/01/2020 0700 Gross per 24 hour  Intake --  Output 312 ml  Net -312 ml   Physical Exam: General: Well appearing caucasian male, well nourished, NAD HENT: normocephalic, atraumatic EYES: conjunctiva non-erythematous, no scleral icterus CV: regular rate, normal rhythm, no murmurs, rubs, gallops. Pulmonary: sating well on RA, decreased breath sounds RLL, no rales, wheezes, rhonchi Abdominal: non-distended, soft, non-tender to palpation, normal BS Skin: Warm and dry, no rashes or lesions Neurological: MS: awake, alert and oriented x4, normal speech and fund of knowledge Motor: moves all extremities antigravity Psych: normal affect  CBC Latest Ref Rng & Units 12/01/2020 11/30/2020 08/14/2018  WBC 4.0 - 10.5 K/uL 9.0 10.0 -  Hemoglobin 13.0 - 17.0 g/dL 08/16/2018 68.1 15.7  Hematocrit 39.0 - 52.0 % 44.9 48.2 40.0  Platelets 150 - 400 K/uL 227 212 -   CMP Latest Ref Rng & Units 12/01/2020 11/30/2020 08/14/2018  Glucose 70 - 99 mg/dL 08/16/2018) 035(D) -  BUN 8 - 23 mg/dL 19 18 -  Creatinine 974(B - 1.24 mg/dL 6.38 4.53 -  Sodium 6.46 - 145 mmol/L 133(L) 135 139  Potassium 3.5 - 5.1 mmol/L 3.5 3.7 3.7  Chloride  98 - 111 mmol/L 99 99 -  CO2 22 - 32 mmol/L 27 24 -  Calcium 8.9 - 10.3 mg/dL 803) 8.9 -  Total Protein 6.5 - 8.1 g/dL 6.7 7.1 -  Total Bilirubin 0.3 - 1.2 mg/dL 1.2 2.1(Y) -  Alkaline Phos 38 - 126 U/L 51 57 -  AST 15 - 41 U/L 31 31 -  ALT 0 - 44 U/L 21 19 -   Magnesium 2.4(M (Abnormal) Collected: 11/30/20 1508  Specimen: Blood Updated: 11/30/20 1616   Magnesium 1.6 Low  mg/dL    TSH 12/02/20 (Abnormal) Collected: 11/30/20 1508  Specimen: Blood Updated: 11/30/20 1641   TSH 6.206 High  uIU/mL    T4, free 12/02/20 (Abnormal) Collected: 12/01/20 0311   Updated: 12/01/20 0436   Free T4 1.43 High  ng/dL    DG Chest Portable 1 View Result Date: 11/30/2020 CLINICAL DATA:  Defibrillator discharge 3 times in past week EXAM: PORTABLE CHEST 1 VIEW COMPARISON:  Portable exam 1547 hours compared to 11/27/2017 IMPRESSION: Minimal enlargement of cardiac silhouette post ICD. No acute abnormalities. Aortic Atherosclerosis (ICD10-I70.0). Electronically Signed   By: 01/28/2018 M.D.   On: 11/30/2020 16:15    EKG 7/15: shows A sensing V pacing at 79 bpm with LBBB like appearance. 1 pseudo-fusion beat   CUP PACEART REMOTE DEVICE CHECK 7/13 Scheduled remote reviewed. Normal device function.  The patient received a successful ICD shock for torsades on 11/29/2020 and also had a successful ATP treatment for VT on 11/27/2020.  There were 5 NSVT arrhythmias detected and two atrial arrhythmias that both lasted less than on.e minute.  Sent to  Triage.  Next remote 91 days. Hassell Halim, RN, CCDS, [employer]  Assessment/Plan:  Active Problems:   COVID-19  Joseph Carson is a 65 year-old with past medical history of PMVT/ VF with ICD, HFrEF, ICD lead dysfunction s/p extraction and replacement, paroxysmal atrial fibrillation, and CAD s/p CABG (2014) who presented to the emergency room after feeling defibrillator discharge. Patient was admitted for AICD workup and was found to be Covid-19  positive.   #Polymorphic Ventricular Tachycardia/Ventricular Fibrillation Patient presented to [facility] ED after ICD shocks and was admitted for evaluation of ICD shocks. Arrhythmias occurring likely 2/2 COVID 19 infection. Device interrogated with three events reported: two on 7/11 and one on 7/13. EP is following, greatly appreciate recommendations. -Tikosyn stopped 7/14 -Keep K greater than/equal to 4.0 and Mg greater than equal to 2.0 -Plan to start amiodarone 7/16, with taper over next several weeks. -Patient should not drive for 6 months  #COVID 19 in unvaccinated patient Patient is unvaccinated and has been mildly symptomatic since 7/4, he incidentally tested positive for COVID 19 after presenting to the ED after ICD shocks. Patient endorses productive cough. He denies SOB, flu like symptoms, diarrhea, CP. Patient is sating well on RA. CXR without acute abnormalities. Has reduced breath sounds RLL on exam. Patient without leukocytosis or fever. -continue to monitor for worsening respiratory symptoms  #Chronic HFrEF s/p AICD #Ischemic cardiomyopathy with biventricular heart failure  #CAD s/p CABG (2014)  Last ECHO 12/02/2017 showed ejection fraction 20-25%, severely reduced LV  function, diffuse hypokinesis, mild to mod MR, mod TR. Patient is euvolemic on exam. Denies angina.  -Lasix 40mg  qd -Continue coreg 6.25 mg BID, titrate as tolerated. -Continue Spironolactone 25mg  qd, has previously failed ARB due to orthostatic hypotension, will not tolerate entresto -Continue Lipitor 40mg  -tele monitoring, strict I/O, daily weights -repeat ECHO  #Paroxysmal a. fib -continue home warfarin  #Elevated Thyroid Labs Patient with TSH 6.206 and free T4 1.43 on admission. Notably patient is taking hair/skin/nail supplements daily which contain biotin. Biotin can affect thyroid studies. Recommended to recheck thyroid labs after 48-72hrs. -Recheck thyroid labs on 7/16 or 7/17  #Tobacco use  disorder Patient smokes cigarettes, 1/14 PPD for 47 years -nicotine patch   Prior to Admission Living Arrangement: Home Anticipated Discharge Location: Home Barriers to Discharge: Medical management after ICD shocks Dispo: Anticipated discharge in approximately 1-2 day(s).   8/16, MD 12/01/20, 3:27 PM  Pager: [phone] Internal Medicine Resident, PGY-1 [facility]

## 2020-12-02 ENCOUNTER — Inpatient Hospital Stay (HOSPITAL_COMMUNITY): Payer: Medicare Other

## 2020-12-02 DIAGNOSIS — I472 Ventricular tachycardia: Secondary | ICD-10-CM | POA: Diagnosis not present

## 2020-12-02 DIAGNOSIS — U071 COVID-19: Secondary | ICD-10-CM | POA: Diagnosis not present

## 2020-12-02 DIAGNOSIS — Z4502 Encounter for adjustment and management of automatic implantable cardiac defibrillator: Secondary | ICD-10-CM | POA: Diagnosis not present

## 2020-12-02 DIAGNOSIS — R7989 Other specified abnormal findings of blood chemistry: Secondary | ICD-10-CM | POA: Diagnosis not present

## 2020-12-02 DIAGNOSIS — I4901 Ventricular fibrillation: Secondary | ICD-10-CM | POA: Diagnosis not present

## 2020-12-02 LAB — BASIC METABOLIC PANEL
Anion gap: 8 (ref 5–15)
BUN: 17 mg/dL (ref 8–23)
CO2: 25 mmol/L (ref 22–32)
Calcium: 8.5 mg/dL — ABNORMAL LOW (ref 8.9–10.3)
Chloride: 99 mmol/L (ref 98–111)
Creatinine, Ser: 0.92 mg/dL (ref 0.61–1.24)
GFR, Estimated: >60 mL/min (ref >60)
Glucose, Bld: 94 mg/dL (ref 70–99)
Potassium: 3.8 mmol/L (ref 3.5–5.1)
Sodium: 132 mmol/L — ABNORMAL LOW (ref 135–145)

## 2020-12-02 LAB — CBC
HCT: 41.2 % (ref 39.0–52.0)
Hemoglobin: 13.9 g/dL (ref 13.0–17.0)
MCH: 29.4 pg (ref 26.0–34.0)
MCHC: 33.7 g/dL (ref 30.0–36.0)
MCV: 87.3 fL (ref 80.0–100.0)
Platelets: 226 10*3/uL (ref 150–400)
RBC: 4.72 MIL/uL (ref 4.22–5.81)
RDW: 13.2 % (ref 11.5–15.5)
WBC: 9.6 10*3/uL (ref 4.0–10.5)
nRBC: 0 % (ref 0.0–0.2)

## 2020-12-02 LAB — ECHOCARDIOGRAM LIMITED
Area-P 1/2: 2.97 cm2
Calc EF: 21.1 %
Height: 70 in
S' Lateral: 7.1 cm
Single Plane A2C EF: 14.7 %
Single Plane A4C EF: 19.9 %
Weight: 3161.6 oz

## 2020-12-02 LAB — MAGNESIUM: Magnesium: 1.9 mg/dL (ref 1.7–2.4)

## 2020-12-02 LAB — PROTIME-INR
INR: 3.8 — ABNORMAL HIGH (ref 0.8–1.2)
Prothrombin Time: 37.4 seconds — ABNORMAL HIGH (ref 11.4–15.2)

## 2020-12-02 MED ORDER — POTASSIUM CHLORIDE CRYS ER 20 MEQ PO TBCR
40.0000 meq | EXTENDED_RELEASE_TABLET | Freq: Once | ORAL | Status: AC
  Administered 2020-12-02: 40 meq via ORAL
  Filled 2020-12-02: qty 2

## 2020-12-02 MED ORDER — MAGNESIUM SULFATE 2 GM/50ML IV SOLN
2.0000 g | Freq: Once | INTRAVENOUS | Status: AC
  Administered 2020-12-02: 2 g via INTRAVENOUS
  Filled 2020-12-02: qty 50

## 2020-12-02 MED ORDER — PERFLUTREN LIPID MICROSPHERE
1.0000 mL | INTRAVENOUS | Status: AC | PRN
  Administered 2020-12-02: 2 mL via INTRAVENOUS
  Filled 2020-12-02: qty 10

## 2020-12-02 NOTE — Progress Notes (Signed)
  Echocardiogram 2D Echocardiogram limited with defintiy has been performed.  Leta Jungling M 12/02/2020, 9:51 AM

## 2020-12-02 NOTE — Progress Notes (Signed)
   Subjective: No acute overnight events.  Patient reports feeling well this morning.  He continues to have some cough and congestion.  Denies any chest pain.  Objective:  Vital signs in last 24 hours: Vitals:   12/01/20 1206 12/01/20 1725 12/01/20 1933 12/02/20 0418  BP: (!) 103/58 96/62 (!) 93/45 102/61  Pulse: 62 66 62 (!) 59  Resp: 20 20 20 20   Temp: 98.4 F (36.9 C) 98.6 F (37 C) 98.9 F (37.2 C) 98.7 F (37.1 C)  TempSrc: Oral Oral Oral Oral  SpO2: 94% 93% (!) 88% 93%  Weight:    89.6 kg  Height:       Physical Exam General: alert, appears stated age, in no acute distress HEENT: Normocephalic, atraumatic, EOM intact, conjunctiva normal CV: Regular rate and rhythm, no murmurs rubs or gallops Pulm: Clear to auscultation bilaterally, normal work of breathing Abdomen: Soft, nondistended, bowel sounds present, no tenderness to palpation MSK: No lower extremity edema Skin: Warm and dry Neuro: Alert and oriented x3   Assessment/Plan:  Active Problems:   COVID-19   AICD discharge   Abnormal thyroid blood test  Mr. Gacek is a 65 year old male with a past medical history ofPMVT/ VF with ICD, HFrEF, ICD lead dysfunction s/p extraction and replacement, paroxysmal atrial fibrillation, and CAD s/p CABG (2014) who presented to the emergency room after feeling defibrillator discharge. Patient was admitted for AICD workup and was found to be Covid-19 positive.  Polymorphic Ventricular Tachycardia/Ventricular Fibrillation Presented after multiple ICD shocks, likely in the setting of COVID-19.  EP is following and started patient on amiodarone today.  Per EP, we will need to closely follow his INR with initiation of amiodarone. - Tikosyn stopped 7/14 - Keep K greater than/equal to 4.0 and Mg greater than equal to 2.0 - Amiodarone started today, taper 400 mg twice daily for 5 days followed by 400 mg daily for 5 days followed by 200 mg daily - Patient should not drive for 6 months    COVID 19 in unvaccinated patient Stable.  Continues to have productive cough and mild congestion.  Saturating well on room air.     Chronic HFrEF s/p AICD Ischemic cardiomyopathy with biventricular heart failure  CAD s/p CABG (2014)  Last ECHO 12/02/2017 showed ejection fraction 20-25%, severely reduced LV  function, diffuse hypokinesis, mild to mod MR, mod TR. Patient is euvolemic on exam. Denies angina. - Continue furosemide 40mg  qd - Continue coreg 6.25 mg BID, titrate as tolerated. - Continue Spironolactone 25mg  qd, has previously failed ARB due to orthostatic hypotension, will not tolerate entresto - Continue Lipitor 40mg  - Tele monitoring, strict I/O, daily weights - Follow up ECHO   Paroxysmal a. fib -Continue warfarin   Elevated Thyroid Labs Patient with TSH 6.206 and free T4 1.43 on admission. Notably patient is taking hair/skin/nail supplements daily which contain biotin.  - Repeat free T3 pending.  Tobacco use disorder - Nicotine patch    Prior to Admission Living Arrangement: Anticipated Discharge Location: Barriers to Discharge: Dispo: Anticipated discharge in approximately 1-2 day(s).    Lockie Bothun N, DO 12/02/2020, 6:11 AM Pager: [phone] After 5pm on weekdays and 1pm on weekends: On Call pager [phone]

## 2020-12-02 NOTE — Plan of Care (Signed)

## 2020-12-02 NOTE — Progress Notes (Signed)
Progress Note  Patient Name: Joseph Carson Date of Encounter: 12/02/2020  [facility] Cardiologist: Lewayne Bunting, MD  Subjective   Doing well this AM eatin gbreakfast. NAEO.  Inpatient Medications    Scheduled Meds:  amiodarone  400 mg Oral BID   atorvastatin  40 mg Oral Daily   carvedilol  12.5 mg Oral BID WC   furosemide  40 mg Oral Daily   nicotine  7 mg Transdermal Daily   potassium chloride  40 mEq Oral Once   spironolactone  25 mg Oral Daily   Warfarin - Pharmacist Dosing Inpatient   Does not apply q1600   Continuous Infusions:  magnesium sulfate bolus IVPB     PRN Meds: acetaminophen **OR** acetaminophen   Vital Signs    Vitals:   12/01/20 1206 12/01/20 1725 12/01/20 1933 12/02/20 0418  BP: (!) 103/58 96/62 (!) 93/45 102/61  Pulse: 62 66 62 (!) 59  Resp: 20 20 20 20   Temp: 98.4 F (36.9 C) 98.6 F (37 C) 98.9 F (37.2 C) 98.7 F (37.1 C)  TempSrc: Oral Oral Oral Oral  SpO2: 94% 93% (!) 88% 93%  Weight:    89.6 kg  Height:        Intake/Output Summary (Last 24 hours) at 12/02/2020 0757 Last data filed at 12/02/2020 0600 Gross per 24 hour  Intake 657 ml  Output 1100 ml  Net -443 ml   Last 3 Weights 12/02/2020 12/01/2020 12/07/2019  Weight (lbs) 197 lb 9.6 oz 195 lb 12.3 oz 216 lb  Weight (kg) 89.631 kg 88.8 kg 97.977 kg      Telemetry    No sustained arrhythmias. - Personally Reviewed  ECG     - Personally Reviewed  Physical Exam   GEN: No acute distress.   Neck: No JVD Cardiac: RRR, no murmurs, rubs, or gallops.  Respiratory: Clear to auscultation bilaterally. GI: Soft, nontender, non-distended  MS: No edema; No deformity. Neuro:  Nonfocal  Psych: Normal affect   Labs    High Sensitivity Troponin:  No results for input(s): TROPONINIHS in the last 720 hours.    Chemistry Recent Labs  Lab 11/30/20 1508 12/01/20 0311 12/02/20 0351  NA 135 133* 132*  K 3.7 3.5 3.8  CL 99 99 99  CO2 24 27 25   GLUCOSE 107* 123* 94   BUN 18 19 17   CREATININE 1.20 1.17 0.92  CALCIUM 8.9 8.7* 8.5*  PROT 7.1 6.7  --   ALBUMIN 3.3* 3.0*  --   AST 31 31  --   ALT 19 21  --   ALKPHOS 57 51  --   BILITOT 1.8* 1.2  --   GFRNONAA >60 >60 >60  ANIONGAP 12 7 8      Hematology Recent Labs  Lab 11/30/20 1508 12/01/20 0311 12/02/20 0351  WBC 10.0 9.0 9.6  RBC 5.41 5.10 4.72  HGB 16.1 15.1 13.9  HCT 48.2 44.9 41.2  MCV 89.1 88.0 87.3  MCH 29.8 29.6 29.4  MCHC 33.4 33.6 33.7  RDW 13.3 13.3 13.2  PLT 212 227 226    BNPNo results for input(s): BNP, PROBNP in the last 168 hours.   DDimer No results for input(s): DDIMER in the last 168 hours.   Radiology    DG Chest Portable 1 View  Result Date: 11/30/2020 CLINICAL DATA:  Defibrillator discharge 3 times in past week EXAM: PORTABLE CHEST 1 VIEW COMPARISON:  Portable exam 1547 hours compared to 11/27/2017 FINDINGS: LEFT subclavian ICD with leads  projecting over RIGHT atrium and RIGHT ventricle. Enlargement of cardiac silhouette. Mediastinal contours and pulmonary vascularity normal. Atherosclerotic calcification aorta. Lungs clear. No infiltrate, pleural effusion, or pneumothorax. Mild osseous demineralization. IMPRESSION: Minimal enlargement of cardiac silhouette post ICD. No acute abnormalities. Aortic Atherosclerosis (ICD10-I70.0). Electronically Signed   By: Ulyses Southward M.D.   On: 11/30/2020 16:15    Cardiac Studies   No new   Assessment & Plan    65yo man with chronic systolic HF w/ ICD a/w PMVT/VF in setting of covid pneumonia. Previously on dofetilide for persistent AF but this has been stopped during this admission.  #PMVT/VF -stop dofetilide - start amiodarone today. Taper: 400mg  PO BID x 5 days followed by 400mg  PO daily x 5 days followed by 200mg  PO daily - close following of his INR given initiation of amiodarone - no driving x 6 months  #Covid pneumonia - management per primary team  #Chronic systolic hF Warm, euvolemic  #CAD No ischemic  symptoms   For questions or updates, please contact [facility] Please consult [email or web address] for contact info under        Signed, , MD  12/02/2020, 7:57 AM

## 2020-12-02 NOTE — Progress Notes (Signed)
ANTICOAGULATION CONSULT NOTE - Follow Up Consult  Pharmacy Consult for Warfarin Indication: atrial fibrillation  No Known Allergies  Patient Measurements: Height: 5\' 10"  (177.8 cm) Weight: 89.6 kg (197 lb 9.6 oz) IBW/kg (Calculated) : 73  Vital Signs: Temp: 98.7 F (37.1 C) (07/16 0418) Temp Source: Oral (07/16 0418) BP: 114/66 (07/16 0847) Pulse Rate: 59 (07/16 0847)  Labs: Recent Labs    11/30/20 1508 12/01/20 0311 12/02/20 0351  HGB 16.1 15.1 13.9  HCT 48.2 44.9 41.2  PLT 212 227 226  LABPROT 35.9* 38.2* 37.4*  INR 3.6* 3.9* 3.8*  CREATININE 1.20 1.17 0.92     Estimated Creatinine Clearance: 90.1 mL/min (by C-G formula based on SCr of 0.92 mg/dL).  Assessment: 65 yr old male on Warfarin prior to admission for persistent atrial fibrillation admitted 11/30/20 for multiple ICD shocks. Home warfarin regimen was 2.5 mg on Saturdays and Sundays and 5 mg all other days. Last dose was on 11/29/20 per patient report.  Last outpatient INR 2.8 on 11/07/20, at goal.  INR supratherapeutic (3.6) on admit, remains elevated at 3.8 today. Amiodarone to begin 7/16, which is likely to increase sensitivity to warfarin. Hgb 13.9, plt 226. No s/sx of bleeding documented.    Goal of Therapy:  INR 2-3 Monitor platelets by anticoagulation protocol: Yes   Plan:  No Warfarin again today. Daily PT/INR  8/16, PharmD, BCCCP Clinical Pharmacist  Phone: [phone] 12/02/2020 9:52 AM  Please check AMION for all [facility] Pharmacy phone numbers After 10:00 PM, call Main Pharmacy [phone]

## 2020-12-02 NOTE — Progress Notes (Signed)
  Date: 12/02/2020  Patient name: Joseph Carson  Medical record number: [id number]  Date of birth: Jul 06, 1955        I have seen and evaluated this patient and I have discussed the plan of care with the house staff. Please see Dr. Patty Sermons note for complete details. I concur with her findings and plan.  Initiation of amiodarone today with monitoring.  TTE performed today at the recommendation of cardiology.  We will follow up results.   Inez Catalina, MD 12/02/2020, 1:11 PM

## 2020-12-03 ENCOUNTER — Other Ambulatory Visit: Payer: Self-pay | Admitting: Internal Medicine

## 2020-12-03 DIAGNOSIS — I4901 Ventricular fibrillation: Secondary | ICD-10-CM | POA: Diagnosis not present

## 2020-12-03 DIAGNOSIS — U071 COVID-19: Secondary | ICD-10-CM | POA: Diagnosis not present

## 2020-12-03 DIAGNOSIS — I472 Ventricular tachycardia: Secondary | ICD-10-CM | POA: Diagnosis not present

## 2020-12-03 DIAGNOSIS — Z4502 Encounter for adjustment and management of automatic implantable cardiac defibrillator: Secondary | ICD-10-CM | POA: Diagnosis not present

## 2020-12-03 LAB — BASIC METABOLIC PANEL
Anion gap: 6 (ref 5–15)
BUN: 17 mg/dL (ref 8–23)
CO2: 28 mmol/L (ref 22–32)
Calcium: 8.6 mg/dL — ABNORMAL LOW (ref 8.9–10.3)
Chloride: 97 mmol/L — ABNORMAL LOW (ref 98–111)
Creatinine, Ser: 1.18 mg/dL (ref 0.61–1.24)
GFR, Estimated: >60 mL/min (ref >60)
Glucose, Bld: 108 mg/dL — ABNORMAL HIGH (ref 70–99)
Potassium: 4.2 mmol/L (ref 3.5–5.1)
Sodium: 131 mmol/L — ABNORMAL LOW (ref 135–145)

## 2020-12-03 LAB — MAGNESIUM: Magnesium: 1.9 mg/dL (ref 1.7–2.4)

## 2020-12-03 LAB — PROTIME-INR
INR: 3.2 — ABNORMAL HIGH (ref 0.8–1.2)
Prothrombin Time: 32.4 seconds — ABNORMAL HIGH (ref 11.4–15.2)

## 2020-12-03 MED ORDER — AMIODARONE HCL 200 MG PO TABS: ORAL_TABLET | ORAL | 0 refills | Status: DC

## 2020-12-03 MED ORDER — WARFARIN SODIUM 2.5 MG PO TABS: ORAL_TABLET | ORAL | 2 refills | Status: DC

## 2020-12-03 MED ORDER — AMIODARONE HCL 400 MG PO TABS: ORAL_TABLET | ORAL | 0 refills | Status: DC

## 2020-12-03 MED ORDER — WARFARIN SODIUM 1 MG PO TABS
1.0000 mg | ORAL_TABLET | Freq: Once | ORAL | Status: DC
  Filled 2020-12-03: qty 1

## 2020-12-03 MED ORDER — WARFARIN SODIUM 1 MG PO TABS
1.0000 mg | ORAL_TABLET | ORAL | Status: AC
  Administered 2020-12-03: 1 mg via ORAL
  Filled 2020-12-03: qty 1

## 2020-12-03 MED ORDER — CARVEDILOL 6.25 MG PO TABS: 12.5000 mg | ORAL_TABLET | Freq: Two times a day (BID) | ORAL | Status: DC

## 2020-12-03 NOTE — Discharge Instructions (Signed)
You were hospitalized due to your ICD shocks likely due to your COVID infection.  Please note the following changes to your medications:  STOP taking Tikosyn START taking amiodarone, please note that you will be tapering this medication as follows Take 400 mg twice daily for 4 days, take 400 mg tonight and then 400 mg tomorrow morning followed by 400 mg tomorrow night. Followed by taking 400 mg once daily for total of 5 days Followed by taking 200 mg once daily DECREASE warfarin to 2.5 mg daily starting tomorrow  I will have you follow-up with our internal medicine clinic in Coumadin clinic here at the hospital.  We will have to follow you after your COVID quarantine, anytime after July 25.  Our clinic will call you to schedule this appointment.

## 2020-12-03 NOTE — Progress Notes (Signed)
ANTICOAGULATION CONSULT NOTE - Follow Up Consult  Pharmacy Consult for Warfarin Indication: atrial fibrillation  No Known Allergies  Patient Measurements: Height: 5\' 10"  (177.8 cm) Weight: 89.1 kg (196 lb 8 oz) IBW/kg (Calculated) : 73  Vital Signs: Temp: 98.6 F (37 C) (07/17 0850) Temp Source: Oral (07/17 0850) BP: 95/63 (07/17 0850) Pulse Rate: 60 (07/17 0850)  Labs: Recent Labs    11/30/20 1508 12/01/20 0311 12/02/20 0351 12/03/20 0234  HGB 16.1 15.1 13.9  --   HCT 48.2 44.9 41.2  --   PLT 212 227 226  --   LABPROT 35.9* 38.2* 37.4* 32.4*  INR 3.6* 3.9* 3.8* 3.2*  CREATININE 1.20 1.17 0.92 1.18     Estimated Creatinine Clearance: 70.1 mL/min (by C-G formula based on SCr of 1.18 mg/dL).  Assessment: 65 yr old male on Warfarin prior to admission for persistent atrial fibrillation admitted 11/30/20 for multiple ICD shocks. Home warfarin regimen was 2.5 mg on Saturdays and Sundays and 5 mg all other days. Last dose was on 11/29/20 per patient report.  Last outpatient INR 2.8 on 11/07/20, at goal.  INR supratherapeutic (3.6) on admit, remains slightly elevated at 3.2 today. Amiodarone began 7/16, which is likely to increase sensitivity to warfarin. Warfarin has been held for 3 days. Plan to give a decreased dose of 1 mg to prevent subtherapeutic INR given the trend towards goal. No s/sx of bleeding documented.    Goal of Therapy:  INR 2-3 Monitor platelets by anticoagulation protocol: Yes   Plan:  Warfarin 1 mg PO once Daily PT/INR  8/16. Roda Shutters.D. PGY-1 Ambulatory Care Resident Phone:[phone] 12/03/2020 9:43 AM   Please check AMION for all [facility] phone numbers After 10:00 PM, call Main Pharmacy [phone]

## 2020-12-03 NOTE — Progress Notes (Signed)
Progress Note  Patient Name: Joseph Carson Date of Encounter: 12/03/2020  [facility] Cardiologist: Lewayne Bunting, MD  Subjective   NAEO. Doing well. Breathing is better.  Inpatient Medications    Scheduled Meds:  amiodarone  400 mg Oral BID   atorvastatin  40 mg Oral Daily   carvedilol  12.5 mg Oral BID WC   furosemide  40 mg Oral Daily   nicotine  7 mg Transdermal Daily   spironolactone  25 mg Oral Daily   Warfarin - Pharmacist Dosing Inpatient   Does not apply q1600   Continuous Infusions:  PRN Meds: acetaminophen **OR** acetaminophen   Vital Signs    Vitals:   12/02/20 1404 12/02/20 1718 12/02/20 2023 12/03/20 0445  BP: 90/60 (!) 101/51 (!) 111/57 102/62  Pulse: 64 60 (!) 59 (!) 59  Resp: 19 18 17 16   Temp: 98 F (36.7 C) 99.3 F (37.4 C) 98.2 F (36.8 C) 99 F (37.2 C)  TempSrc: Oral Oral Oral Oral  SpO2:  93% 93% 94%  Weight:    89.1 kg  Height:        Intake/Output Summary (Last 24 hours) at 12/03/2020 0850 Last data filed at 12/03/2020 0300 Gross per 24 hour  Intake --  Output 500 ml  Net -500 ml   Last 3 Weights 12/03/2020 12/02/2020 12/01/2020  Weight (lbs) 196 lb 8 oz 197 lb 9.6 oz 195 lb 12.3 oz  Weight (kg) 89.132 kg 89.631 kg 88.8 kg      Telemetry    Paced. No VT/VF. - Personally Reviewed  ECG    No new - Personally Reviewed  Physical Exam   GEN: No acute distress.   Neck: No JVD Cardiac: RRR, no murmurs, rubs, or gallops.  Respiratory: Clear to auscultation bilaterally. GI: Soft, nontender, non-distended  MS: No edema; No deformity. Neuro:  Nonfocal  Psych: Normal affect   Labs    High Sensitivity Troponin:  No results for input(s): TROPONINIHS in the last 720 hours.    Chemistry Recent Labs  Lab 11/30/20 1508 12/01/20 0311 12/02/20 0351 12/03/20 0234  NA 135 133* 132* 131*  K 3.7 3.5 3.8 4.2  CL 99 99 99 97*  CO2 24 27 25 28   GLUCOSE 107* 123* 94 108*  BUN 18 19 17 17   CREATININE 1.20 1.17 0.92 1.18   CALCIUM 8.9 8.7* 8.5* 8.6*  PROT 7.1 6.7  --   --   ALBUMIN 3.3* 3.0*  --   --   AST 31 31  --   --   ALT 19 21  --   --   ALKPHOS 57 51  --   --   BILITOT 1.8* 1.2  --   --   GFRNONAA >60 >60 >60 >60  ANIONGAP 12 7 8 6      Hematology Recent Labs  Lab 11/30/20 1508 12/01/20 0311 12/02/20 0351  WBC 10.0 9.0 9.6  RBC 5.41 5.10 4.72  HGB 16.1 15.1 13.9  HCT 48.2 44.9 41.2  MCV 89.1 88.0 87.3  MCH 29.8 29.6 29.4  MCHC 33.4 33.6 33.7  RDW 13.3 13.3 13.2  PLT 212 227 226    BNPNo results for input(s): BNP, PROBNP in the last 168 hours.   DDimer No results for input(s): DDIMER in the last 168 hours.   Radiology    ECHOCARDIOGRAM LIMITED  Result Date: 12/02/2020    ECHOCARDIOGRAM LIMITED REPORT   Patient Name:   Joseph Carson Date of Exam: 12/02/2020  Medical Rec #:  [id number]             Height:       70.0 in Accession #:    [id number]            Weight:       197.6 lb Date of Birth:  Nov 03, 1955             BSA:          2.077 m Patient Age:    65 years              BP:           102/61 mmHg Patient Gender: M                     HR:           59 bpm. Exam Location:  Inpatient Procedure: Limited Echo, Cardiac Doppler, Color Doppler and Intracardiac            Opacification Agent Indications:    Ventricular Tachycardia I47.2  History:        Patient has prior history of Echocardiogram examinations, most                 recent 06/16/2014. CHF, CAD, Defibrillator, Pulmonary HTN;                 Arrythmias:LBBB and Atrial Fibrillation. Covid pneumonia.                 Ischemic cardiomyopathy.  Sonographer:    Leta Junglingiffany Cooper RDCS Referring Phys: [id number] [place] LivingsRICHARD S DYKSTRA IMPRESSIONS  1. Left ventricular ejection fraction, by estimation, is <20%. The left ventricle has severely decreased function. The left ventricle demonstrates global hypokinesis. The left ventricular internal cavity size was severely dilated. Left ventricular diastolic parameters are consistent with Grade I diastolic  dysfunction (impaired relaxation).  2. Right ventricular systolic function is normal. The right ventricular size is normal. There is normal pulmonary artery systolic pressure. The estimated right ventricular systolic pressure is 25.5 mmHg.  3. Left atrial size was moderately dilated.  4. Right atrial size was mildly dilated.  5. The mitral valve is normal in structure. Mild mitral valve regurgitation. No evidence of mitral stenosis.  6. The aortic valve is normal in structure. Aortic valve regurgitation is not visualized. No aortic stenosis is present.  7. The inferior vena cava is normal in size with greater than 50% respiratory variability, suggesting right atrial pressure of 3 mmHg. FINDINGS  Left Ventricle: Left ventricular ejection fraction, by estimation, is <20%. The left ventricle has severely decreased function. The left ventricle demonstrates global hypokinesis. Definity contrast agent was given IV to delineate the left ventricular endocardial borders. The left ventricular internal cavity size was severely dilated. There is no left ventricular hypertrophy. Abnormal (paradoxical) septal motion, consistent with left bundle branch block. Left ventricular diastolic parameters are consistent with Grade I diastolic dysfunction (impaired relaxation). Right Ventricle: The right ventricular size is normal. No increase in right ventricular wall thickness. Right ventricular systolic function is normal. There is normal pulmonary artery systolic pressure. The tricuspid regurgitant velocity is 2.37 m/s, and  with an assumed right atrial pressure of 3 mmHg, the estimated right ventricular systolic pressure is 25.5 mmHg. Left Atrium: Left atrial size was moderately dilated. Right Atrium: Right atrial size was mildly dilated. Pericardium: There is no evidence of pericardial effusion. Mitral Valve: The mitral valve is normal in structure. Mild mitral valve regurgitation. No  evidence of mitral valve stenosis. Tricuspid  Valve: The tricuspid valve is normal in structure. Tricuspid valve regurgitation is mild . No evidence of tricuspid stenosis. Aortic Valve: The aortic valve is normal in structure. Aortic valve regurgitation is not visualized. No aortic stenosis is present. Pulmonic Valve: The pulmonic valve was normal in structure. Pulmonic valve regurgitation is not visualized. No evidence of pulmonic stenosis. Aorta: The aortic root is normal in size and structure. Venous: The inferior vena cava is normal in size with greater than 50% respiratory variability, suggesting right atrial pressure of 3 mmHg. IAS/Shunts: No atrial level shunt detected by color flow Doppler. Additional Comments: A device lead is visualized in the right ventricle. LEFT VENTRICLE PLAX 2D LVIDd:         7.70 cm      Diastology LVIDs:         7.10 cm      LV e' medial:    3.85 cm/s LV PW:         0.90 cm      LV E/e' medial:  11.0 LV IVS:        0.90 cm      LV e' lateral:   3.47 cm/s LVOT diam:     1.90 cm      LV E/e' lateral: 12.2 LV SV:         47 LV SV Index:   23 LVOT Area:     2.84 cm  LV Volumes (MOD) LV vol d, MOD A2C: 265.0 ml LV vol d, MOD A4C: 278.0 ml LV vol s, MOD A2C: 226.0 ml LV vol s, MOD A4C: 222.7 ml LV SV MOD A2C:     39.0 ml LV SV MOD A4C:     278.0 ml LV SV MOD BP:      59.7 ml LEFT ATRIUM             Index LA diam:        4.40 cm 2.12 cm/m LA Vol (A2C):   43.0 ml 20.71 ml/m LA Vol (A4C):   40.8 ml 19.65 ml/m LA Biplane Vol: 44.9 ml 21.62 ml/m  AORTIC VALVE LVOT Vmax:   86.00 cm/s LVOT Vmean:  55.000 cm/s LVOT VTI:    0.167 m  AORTA Ao Root diam: 3.10 cm MITRAL VALVE               TRICUSPID VALVE MV Area (PHT): 2.97 cm    TR Peak grad:   22.5 mmHg MV Decel Time: 256 msec    TR Vmax:        237.00 cm/s MV E velocity: 42.45 cm/s MV A velocity: 75.95 cm/s  SHUNTS MV E/A ratio:  0.56        Systemic VTI:  0.17 m                            Systemic Diam: 1.90 cm Donato Schultz MD Electronically signed by Donato Schultz MD Signature  Date/Time: 12/02/2020/11:50:20 AM    Final     Cardiac Studies   No new    Assessment & Plan    65yo man with chronic systolic HF w/ ICD a/w PMVT/VF in setting of covid pneumonia. Previously on dofetilide for persistent AF but this has been stopped during this admission.   #PMVT/VF -stop dofetilide - continue amiodarone upon discharge. Taper: 400mg  PO BID x 5 days followed by 400mg  PO daily x 5 days followed by  200mg  PO daily - close following of his INR given initiation of amiodarone - no driving x 6 months  - will set up f/u w/ EP  #Covid pneumonia - management per primary team   #Chronic systolic hF Warm, euvolemic   #CAD No ischemic symptoms  OK to discharge from an EP perspective.   For questions or updates, please contact [facility] Please consult [email or web address] for contact info under        Signed, , MD  12/03/2020, 8:50 AM

## 2020-12-03 NOTE — Discharge Summary (Signed)
Name: Joseph Carson MRN: [id number] DOB: [birth year] 65 y.o. PCP: Patient, No Pcp Per (Inactive)  Date of Admission: 11/30/2020  2:57 PM Date of Discharge: 12/03/2020 Attending Physician: Inez Catalina, MD  Discharge Diagnosis: 1. PMVT/VF 2.COVID-19 PNA 3. Paroxysmal a. Fib 4.Elevated Thyroid Labs in the setting of Biotin supplementation  Discharge Medications: Allergies as of 12/03/2020   No Known Allergies      Medication List     STOP taking these medications    dofetilide 250 MCG capsule Commonly known as: TIKOSYN       TAKE these medications    acetaminophen 325 MG tablet Commonly known as: TYLENOL Take 1-2 tablets (325-650 mg total) by mouth every 4 (four) hours as needed for mild pain.   amiodarone 400 MG tablet Commonly known as: PACERONE Take 1 tablet (400 mg total) by mouth 2 (two) times daily for 4 days, THEN 1 tablet (400 mg total) daily for 5 days, THEN 0.5 tablets (200 mg total) daily. Take 400mg  PO twice daily for 4 more days followed by 400mg  PO daily for 5 days followed by 200mg  PO daily. Start taking on: December 03, 2020   atorvastatin 40 MG tablet Commonly known as: LIPITOR TAKE 1 TABLET BY MOUTH DAILY AT 6 PM. What changed: when to take this   carvedilol 6.25 MG tablet Commonly known as: COREG Take 2 tablets (12.5 mg total) by mouth 2 (two) times daily with a meal. What changed: See the new instructions.   furosemide 40 MG tablet Commonly known as: LASIX TAKE 1 TABLET BY MOUTH DAILY   HAIR/SKIN/NAILS PO Take 1 tablet by mouth daily.   spironolactone 25 MG tablet Commonly known as: ALDACTONE TAKE 1 TABLET BY MOUTH DAILY.   warfarin 2.5 MG tablet Commonly known as: COUMADIN Take as directed. If you are unsure how to take this medication, talk to your nurse or doctor. Original instructions: Take 1/2 to 1 tablet daily as directed by coumadin clinic. What changed: medication strength        Disposition and follow-up:    JosephJoseph Carson was discharged from [facility] in Stable condition.  At the hospital follow up visit please address:  1.  Polymorphic Ventricular Tachycardia/Ventricular Fibrillation: Patient presented after ICD shock. He was admitted for ICD workup. He was transitioned to amiodarone for rhythm control. He will need close follow up with EP after discharge. He is on a amiodarone loading dose and taper plan per EP. Patient should not drive for 6 months. COVID 19 in unvaccinated patient: Patient has mild symptoms of fatigue, cough though no SOB. No oxygen requirement this admission. Paroxysmal a. Fib: Patient was continued on warfarin. After initiation of amiodarone, the warfarin dose was adjusted per pharmacy. Patient will need to follow closely with warfarin clinic to monitor PT/INR.  Elevated Thyroid Labs: Patient had elevated thyroid labs on admission possibly due to hair skin nail supplement use since these contain Biotin and these can interfere with thyroid labs.   2.  Labs / imaging needed at time of follow-up: PT/INR checks  3.  Pending labs/ test needing follow-up: T3  Follow-up Appointments:  Follow-up Information     December 05, 2020, MD. Call in 2 day(s).   Specialty: Cardiology Why: Call in 2 days to make an appointment with your electrophysiologist. Contact information: [address] [place] [address] [phone]         [place], [place]. Call.   Specialty: Cardiology  Why: Make sure you have a scheduled appointment with your cardiologist to discuss your echocardiogram results from your hospital admission. Contact information: [address] [place] [address] [phone]         [facility]. Call in 1 week(s).   Why: Our office will call you to set up an appointment with our primary care office. Please call [phone] within one week if you do not hear from our office. Contact  information: [address] [place] [address] [phone]                Hospital Course by problem list:  Polymorphic Ventricular Tachycardia/Ventricular Fibrillation Joseph Carson is a 65 year-old with past medical history of PMVT/ VF with ICD, HFrEF, ICD lead dysfunction s/p extraction and replacement, paroxysmal atrial fibrillation, and CAD s/p CABG (2014) who presented to the emergency room after ICD shock. Patient was admitted for AICD workup and was found to be Covid-19 positive. Arrhythmias occurring likely 2/2 COVID 19 infection. Device interrogated with three events reported: two on 7/11 and one on 7/13. EP was consulted and discontinued patient's home Tikosyn. Patient was started on amiodarone for rhythm control after Tikosyn washout period. Patient discharged on 12/03/2020 on PO amiodarone with taper plan: 400mg  PO BID x 5 days followed by 400mg  PO daily x 5 days followed by 200mg  PO daily. Since amiodarone can potentiate effects of warfarin, patient's INR will need to be monitored closely after discharge. Patient cannot drive for 6 months. Patient will follow up with EP outpatient.      COVID 19 in unvaccinated patient Patient is unvaccinated and has been mildly symptomatic since 7/4, he incidentally tested positive for COVID 19 after presenting to the ED after ICD shocks. Patient has not had a leukocytosis or fever. Patient endorses productive cough. He denies SOB, flu like symptoms, diarrhea, CP. Patient is sating well on RA. CXR without acute abnormalities. He had reduced breath sounds RLL on exam as well as diffuse rhonchi. Patient is stable for discharge, has no oxygen requirement.   Chronic HFrEF s/p AICD Ischemic cardiomyopathy with biventricular heart failure  CAD s/p CABG (2014) Patient presented ED after ICD shock though denied angina, EKG and troponins were reassuring, there was low suspicion for ACS. We repeated his ECHO which showed less than 20% EF,  worse from previous with severely reduced LV function and LV global hypokinesis, and grade 1 diastolic dysfunction. Patient is euvolemic on exam. Patient was continued on Lasix 40mg  daily, Coreg 6.25mg  BID, Spirnolactone 25mg  qd, and Lipotor 40mg  qd. Patient will need to follow up with cardiology for worsening heart function on ECHO.    Paroxysmal a. Fib Patient was restarted on warfarin for anticoagulation. After amiodarone was started, warfarin dosing was adjusted by pharmacy to prevent increase in INR since amiodarone can potentiate the effects of warfarin. Patient will need to follow up with his PCP and be followed closely to monitor PT INR.   Elevated Thyroid Labs Patient with TSH 6.206 and free T4 1.43 on admission. Notably patient is taking hair/skin/nail supplements daily which contain biotin. Biotin can affect thyroid studies. Repeat T3 pending. Patient will need to follow up with PCP to follow up on repeat thyroid testing.   Tobacco use disorder Patient smokes cigarettes, 1/14 PPD for 47 years. Patient was provided with nicotine patch.   Subjective on day of discharge: Patient denies ICD shocks, CP. He notes improvement in cough. He denies SOB. He reports cardiology felt  he was ready to go home today and he is in agreement.   Discharge Exam:   BP 95/63 (BP Location: Left Arm)   Pulse 60   Temp 98.6 F (37 C) (Oral)   Resp 16   Ht 5\' 10"  (1.778 m)   Wt 89.1 kg   SpO2 93%   BMI 28.19 kg/m  Physical Exam: General: Well appearing caucasian male, well nourished, NAD HENT: normocephalic, atraumatic EYES: conjunctiva non-erythematous, no scleral icterus CV: regular rate, normal rhythm, no murmurs, rubs, gallops. Pulmonary: sating well on RA, decreased breath sounds RLL, no rales, wheezes, rhonchi Abdominal: non-distended, soft, non-tender to palpation, normal BS Skin: Warm and dry, no rashes or lesions Neurological: MS: awake, alert and oriented x4, normal speech and fund of  knowledge Motor: moves all extremities antigravity Psych: normal affect  Pertinent Labs, Studies, and Procedures:  CBC Latest Ref Rng & Units 12/02/2020 12/01/2020 11/30/2020  WBC 4.0 - 10.5 K/uL 9.6 9.0 10.0  Hemoglobin 13.0 - 17.0 g/dL 12/02/2020 56.4 33.2  Hematocrit 39.0 - 52.0 % 41.2 44.9 48.2  Platelets 150 - 400 K/uL 226 227 212   BMP Latest Ref Rng & Units 12/03/2020 12/02/2020 12/01/2020  Glucose 70 - 99 mg/dL 12/03/2020) 94 884(Z)  BUN 8 - 23 mg/dL 17 17 19   Creatinine 0.61 - 1.24 mg/dL 660(Y 3.01  BUN/Creat Ratio 10 - 24 - - -  Sodium 135 - 145 mmol/L 131(L) 132(L) 133(L)  Potassium 3.5 - 5.1 mmol/L 4.2 3.8 3.5  Chloride 98 - 111 mmol/L 97(L) 99 99  CO2 22 - 32 mmol/L 28 25 27   Calcium 8.9 - 10.3 mg/dL 6.01) 0.93) )   TSH 2.3(F (Abnormal) Collected: 11/30/20 1508  Specimen: Blood Updated: 11/30/20 1641   TSH 6.206 High  uIU/mL    T4, free [[id number]] (Abnormal) Collected: 12/01/20 0311   Updated: 12/01/20 0436   Free T4 1.43 High  ng/dL    Prothrombin Time [[id number]] - 15.2 seconds 32.4 High   37.4 High   38.2 High   35.9 High       INR 0.8 - 1.2 3.2 High   3.8 High  CM  3.9 High  CM  3.6 High        Discharge Instructions: Discharge Instructions     Call MD for:  difficulty breathing, headache or visual disturbances   Complete by: As directed    Call MD for:  persistant dizziness or light-headedness   Complete by: As directed    Call MD for:  persistant nausea and vomiting   Complete by: As directed    Call MD for:  temperature >100.4   Complete by: As directed    Diet - low sodium heart healthy   Complete by: As directed    Increase activity slowly   Complete by: As directed        Signed: 12/03/20, MD 12/03/20, 1:22 PM  Pager: [phone] Internal Medicine Resident, PGY-1 [facility]

## 2020-12-03 NOTE — Plan of Care (Signed)

## 2020-12-04 LAB — T3, FREE: T3, Free: 2.3 pg/mL (ref 2.0–4.4)

## 2020-12-05 ENCOUNTER — Telehealth: Payer: Self-pay | Admitting: Internal Medicine

## 2020-12-05 NOTE — Telephone Encounter (Signed)
TOC HFU appointment 12/14/2020 at 3:15 pm with Dr. Sloan Leiter.  Appointment card has been mailed to patient.

## 2020-12-05 NOTE — Telephone Encounter (Signed)
-----   Message from Jaci Standard, DO sent at 12/03/2020  1:10 PM EDT ----- Can we schedule this patient for hospital follow up? He will be establishing care with [place]. He tested positive for covid on 7/14 so we can schedule him for any day after the 25th.  He will also need an appointment with our Coumadin clinic, I have Cc-ed Dr. Alexandria Lodge. He is on warfarin but recently started on Amiodarone and will need close monitoring.  Thanks!

## 2020-12-07 NOTE — Telephone Encounter (Signed)
Transition Care Management Unsuccessful Follow-up Telephone Call  Date of discharge and from where:  12/03/20 [address]  Attempts:  1st Attempt  Reason for unsuccessful TCM follow-up call:  Left voice message

## 2020-12-08 ENCOUNTER — Telehealth: Payer: Self-pay | Admitting: Internal Medicine

## 2020-12-08 NOTE — Telephone Encounter (Signed)
New PT HFU sch with Dr. Sloan Leiter on 12/14/2020.  Pt states he is returnign a phone call from the nurse and,  he needs help with whether or not he should be taking the following medication:   warfarin (COUMADIN) 2.5 MG tablet

## 2020-12-08 NOTE — Telephone Encounter (Signed)
Return pt call - stated he has questions on how he should be taking the Coumadin. Stated since discharged from the hospital he has been taking it  as 5 mg 1/2 tab (2.5 mg) daily. Also stated he has a coumadin appt in [place] at the clinic on Tuesday. Since he had been covid +; instructed pt to let them know and wear a mask; pt stated it has been more than 10 days. I will send message to discharge team.

## 2020-12-12 ENCOUNTER — Ambulatory Visit (INDEPENDENT_AMBULATORY_CARE_PROVIDER_SITE_OTHER): Payer: Medicare Other

## 2020-12-12 ENCOUNTER — Other Ambulatory Visit: Payer: Self-pay

## 2020-12-12 DIAGNOSIS — Z7901 Long term (current) use of anticoagulants: Secondary | ICD-10-CM

## 2020-12-12 DIAGNOSIS — I513 Intracardiac thrombosis, not elsewhere classified: Secondary | ICD-10-CM | POA: Diagnosis not present

## 2020-12-12 DIAGNOSIS — Z5181 Encounter for therapeutic drug level monitoring: Secondary | ICD-10-CM | POA: Diagnosis not present

## 2020-12-12 LAB — POCT INR: INR: 4 — AB (ref 2.0–3.0)

## 2020-12-12 NOTE — Patient Instructions (Signed)
Hold today only and then decrease to 0.5 tablet daily except 1 tablet on Wednesday.  Recheck INR in 1 week.  Call Coumadin Clinic if placed on any new medications or scheduled for any procedures - [phone];  Pt was told to reduce Warfarin to 0.5 tablets daily, while starting Amiodarone.

## 2020-12-14 ENCOUNTER — Encounter: Payer: Self-pay | Admitting: Internal Medicine

## 2020-12-14 ENCOUNTER — Ambulatory Visit (INDEPENDENT_AMBULATORY_CARE_PROVIDER_SITE_OTHER): Payer: Medicare Other | Admitting: Internal Medicine

## 2020-12-14 ENCOUNTER — Other Ambulatory Visit: Payer: Self-pay

## 2020-12-14 VITALS — BP 119/73 | HR 69 | Temp 97.8°F | Resp 28 | Ht 70.0 in | Wt 202.3 lb

## 2020-12-14 DIAGNOSIS — I5022 Chronic systolic (congestive) heart failure: Secondary | ICD-10-CM | POA: Diagnosis not present

## 2020-12-14 DIAGNOSIS — D6832 Hemorrhagic disorder due to extrinsic circulating anticoagulants: Secondary | ICD-10-CM | POA: Diagnosis not present

## 2020-12-14 DIAGNOSIS — Z1211 Encounter for screening for malignant neoplasm of colon: Secondary | ICD-10-CM

## 2020-12-14 DIAGNOSIS — I255 Ischemic cardiomyopathy: Secondary | ICD-10-CM

## 2020-12-14 DIAGNOSIS — T45515A Adverse effect of anticoagulants, initial encounter: Secondary | ICD-10-CM | POA: Diagnosis not present

## 2020-12-14 DIAGNOSIS — I4819 Other persistent atrial fibrillation: Secondary | ICD-10-CM | POA: Diagnosis not present

## 2020-12-14 DIAGNOSIS — Z720 Tobacco use: Secondary | ICD-10-CM

## 2020-12-14 DIAGNOSIS — E785 Hyperlipidemia, unspecified: Secondary | ICD-10-CM | POA: Diagnosis present

## 2020-12-14 NOTE — Assessment & Plan Note (Addendum)
Patient presents to clinic following hospitalization due to ICD shock with PMHX of HFpEF and ischemic cardiomyopathy s/p CABG x3 and Bi-ventricular implantable cardioverter defibrillator April 2016.  Last echo 12/02/20 <20%.  He was subsequently diagnosed with COVID in the ER on admission.  At that time he was minimally symptomatic with fatigue and cough with no shortness of breath.  Since then most of his symptoms have resolved, but he states that he remains fatigued.  He did not have further episodes of ventricular T/VF while he was hospitalized or afterwards.  He was transitioned to amiodarone for rhythm control inpatient and is on his loading dose currently per EP.  Of note he did have elevated thyroid labs on admission possibly due to hair skin nail supplement use, will let patient know to discontinue that before we recheck his thyroid in 3 months.    -Continue on amiodarone per EP -No driving for next 6 months -will recheck thyroid in 3 months after letting patient know to d/c biotin supplement for 7 days prior.

## 2020-12-14 NOTE — Progress Notes (Signed)
CC: establishing care hospital follow-up after ICD shock  HPI:  JosephJoseph Carson is a 65 y.o. with medical history as below presenting to [facility] for hospital follow-up after his ICD shocked him due to ventricular tachycardia.  He was found to be COVID+ on admission.  Please see problem-based list for further details, assessments, and plans.  Past Medical History:  Diagnosis Date   AICD (automatic cardioverter/defibrillator) present    Atrial fibrillation, chronic (HCC)    CAD (coronary artery disease)    a. s/p CABG x 3 03/2013 (L-LAD, S-RI, S-PDA) in [place]   CAP (community acquired pneumonia) 08/22/2015   CHF (congestive heart failure) (HCC)    Chronic combined systolic and diastolic heart failure (HCC) November 2014   Coronary artery disease    Full dentures    HLD (hyperlipidemia)     Hyperbilirubinemia    Ischemic cardiomyopathy    a. echo (10/15):  EF 20%, diff HK, apical AK, mobile density inf wall near papillary muscle (?ruptured) with mod MR, mod reduced RVSF, mild PI, PASP 37 mmHg b. s/p CRTD    LBBB (left bundle branch block)     LV (left ventricular) mural thrombus     a. TEE (10/15):  EF 15-20%, septum and ant walls AK, lat wall HK, no papillary muscle rupture, probable small thrombus on ant/ant-septal wall, mild MR, mod decreased RVSF, neg bubble study   Mitral regurgitation     mild by TEE 02/2014   NSVT (nonsustained ventricular tachycardia) (HCC)         Obesity    Pulmonary hypertension (HCC)    Umbilical hernia    "have had it since age [age]"   Umbilical hernia    Wears glasses    Review of Systems:  As per HPI  Past Surgical History:  Procedure Laterality Date   BI-VENTRICULAR IMPLANTABLE CARDIOVERTER DEFIBRILLATOR N/A 09/01/2014   MDT CRTD implanted by Dr Ladona Ridgel   CARDIAC CATHETERIZATION  03/2013   CARDIOVERSION N/A 12/04/2017   Procedure: CARDIOVERSION;  Surgeon: Marinus Maw, MD;  Location: [facility] INVASIVE CV LAB;  Service: Cardiovascular;   Laterality: N/A;   CORONARY ARTERY BYPASS GRAFT  03/2013   LIMA to the LAD, SVG to the ramus, SVG to the PDA   CORONARY ARTERY BYPASS GRAFT  2014   CABG "X3"   FRACTURE SURGERY     ICD LEAD REMOVAL N/A 08/14/2018   Procedure: ICD LEAD REMOVAL;  Surgeon: Marinus Maw, MD;  Location: [facility] OR;  Service: Cardiovascular;  Laterality: N/A;   INSERT / REPLACE / REMOVE PACEMAKER  2016   LEAD INSERTION N/A 08/14/2018   Procedure: LEAD INSERTION;  Surgeon: Marinus Maw, MD;  Location: [facility] OR;  Service: Cardiovascular;  Laterality: N/A;   LEAD REVISION/REPAIR N/A 12/04/2017   Procedure: LEAD REVISION/REPAIR;  Surgeon: Marinus Maw, MD;  Location: [facility] INVASIVE CV LAB;  Service: Cardiovascular;  Laterality: N/A;   MULTIPLE TOOTH EXTRACTIONS     PATELLA FRACTURE SURGERY Left 1972   TEE WITHOUT CARDIOVERSION N/A 03/04/2014   Procedure: TRANSESOPHAGEAL ECHOCARDIOGRAM (TEE);  Surgeon: Laurey Morale, MD;  Location: [facility] ENDOSCOPY;  Service: Cardiovascular;  Laterality: N/A;   TEE WITHOUT CARDIOVERSION N/A 12/02/2017   Procedure: TRANSESOPHAGEAL ECHOCARDIOGRAM (TEE);  Surgeon: Jodelle Red, MD;  Location: [facility] ENDOSCOPY;  Service: Cardiovascular;  Laterality: N/A;    Allergies: No known allergies  Family History  Problem Relation Age of Onset   Diabetes Mother        Deceased  Thyroid disease Mother    Social History: Patient lives in [place] alone, he is a current smoker and is trying to decrease the amount he smokes.  Denies alcohol consumption, but does smoke marijuana on occasion.  He does not work.  Enjoys visiting with his daughter and grandchildren who live in [place].  He has not been vaccinated for COVID or Shingles.  Physical Exam:  Vitals:   12/14/20 1501  BP: 119/73  Pulse: 69  Resp: (!) 28  Temp: 97.8 F (36.6 C)  TempSrc: Oral  SpO2: 97%  Weight: 202 lb 4.8 oz (91.8 kg)  Height: 5\' 10"  (1.778 m)    General: well-developed, well-nourished HENT: NCAT, no  scars Eyes: wears glasses, sclera non-icteric CV: normal rate, no murmurs noted Pulm: CTAB, normal pulmonary effort GI: no tenderness to palpation, bowel sounds present MSK: normal gait, midsternal scar noted from previous surgery Skin: no bruises or rashes noted Psych: normal mood and affect   Assessment & Plan:   See Encounters Tab for problem based charting.  Patient seen with Dr. 

## 2020-12-14 NOTE — Assessment & Plan Note (Signed)
Patient on warfarin and follows with warfarin clinic here

## 2020-12-14 NOTE — Assessment & Plan Note (Signed)
Current smoker.  He states that he is trying to reduce the amount he smokes.

## 2020-12-14 NOTE — Assessment & Plan Note (Signed)
Follows with warfarin clinic here to see Dr. Alexandria Lodge.

## 2020-12-14 NOTE — Patient Instructions (Signed)
[name], it was a pleasure seeing you today!  Today we discussed: Hospital follow-up- it was great to see you again today when you are feeling better.  Please continue following up with your cardiologist as well as the warfarin clinic here.  Today we are getting some blood work to check how your cholesterol is doing.  I will call you back with those results within a few business days.  Please come back in about 3 months and we will recheck your thyroid.  I have ordered the following labs today:   Lab Orders  Lipid Profile    Tests ordered today:  Lipid profile- to check your cholesterol  Referrals ordered today:   Referral Orders  No referral(s) requested today     I have ordered the following medication/changed the following medications:   Stop the following medications: There are no discontinued medications.   Start the following medications: No orders of the defined types were placed in this encounter.    Follow-up: 3 months   Please make sure to arrive 15 minutes prior to your next appointment. If you arrive late, you may be asked to reschedule.   We look forward to seeing you next time. Please call our clinic at [phone] if you have any questions or concerns. The best time to call is Monday-Friday from 9am-4pm, but there is someone available 24/7. If after hours or the weekend, call the main hospital number and ask for the Internal Medicine Resident On-Call. If you need medication refills, please notify your pharmacy one week in advance and they will send Joseph Carson a request.  Thank you for letting Joseph Carson take part in your care. Wishing you the best!  Thank you, Dr. Sloan Leiter

## 2020-12-14 NOTE — Assessment & Plan Note (Signed)
Patient declines GI referral for colonoscopy at this time.  He has not had one before.  He states that he will think on it and get back to [place] next visit in 3 months.

## 2020-12-14 NOTE — Assessment & Plan Note (Signed)
Medications: Lipitor 40 mg Last Cholesterol: in 2016 total=126 LDL=62  A/P: -Lipid panel

## 2020-12-15 LAB — LIPID PANEL
Chol/HDL Ratio: 2.9 ratio (ref 0.0–5.0)
Cholesterol, Total: 114 mg/dL (ref 100–199)
HDL: 40 mg/dL (ref >39)
LDL Chol Calc (NIH): 56 mg/dL (ref 0–99)
Triglycerides: 94 mg/dL (ref 0–149)
VLDL Cholesterol Cal: 18 mg/dL (ref 5–40)

## 2020-12-19 ENCOUNTER — Ambulatory Visit (INDEPENDENT_AMBULATORY_CARE_PROVIDER_SITE_OTHER): Payer: Medicare Other

## 2020-12-19 ENCOUNTER — Other Ambulatory Visit: Payer: Self-pay

## 2020-12-19 DIAGNOSIS — Z5181 Encounter for therapeutic drug level monitoring: Secondary | ICD-10-CM

## 2020-12-19 DIAGNOSIS — I513 Intracardiac thrombosis, not elsewhere classified: Secondary | ICD-10-CM | POA: Diagnosis not present

## 2020-12-19 LAB — POCT INR: INR: 1.5 — AB (ref 2.0–3.0)

## 2020-12-19 NOTE — Patient Instructions (Signed)
Take 1.5 tablets today only and then continue 0.5 tablet daily except 1 tablet on Wednesday.  Recheck INR in 1 week.  Call Coumadin Clinic if placed on any new medications or scheduled for any procedures - [phone];

## 2020-12-23 NOTE — Progress Notes (Signed)
Remote ICD transmission.   

## 2020-12-26 ENCOUNTER — Other Ambulatory Visit: Payer: Self-pay

## 2020-12-26 ENCOUNTER — Ambulatory Visit (INDEPENDENT_AMBULATORY_CARE_PROVIDER_SITE_OTHER): Payer: Medicare Other

## 2020-12-26 DIAGNOSIS — Z5181 Encounter for therapeutic drug level monitoring: Secondary | ICD-10-CM | POA: Diagnosis not present

## 2020-12-26 DIAGNOSIS — I513 Intracardiac thrombosis, not elsewhere classified: Secondary | ICD-10-CM | POA: Diagnosis not present

## 2020-12-26 LAB — POCT INR: INR: 2.2 (ref 2.0–3.0)

## 2020-12-26 NOTE — Patient Instructions (Signed)
continue 0.5 tablet daily except 1 tablet on Wednesday.  Recheck INR in 6 weeks.  Call Coumadin Clinic if placed on any new medications or scheduled for any procedures - [phone];

## 2020-12-31 NOTE — Progress Notes (Signed)
Internal Medicine Clinic Attending  I saw and evaluated the patient.  I personally confirmed the key portions of the history and exam documented by Dr. Masters and I reviewed pertinent patient test results.  The assessment, diagnosis, and plan were formulated together and I agree with the documentation in the resident's note.  

## 2021-01-02 ENCOUNTER — Other Ambulatory Visit: Payer: Self-pay

## 2021-01-02 ENCOUNTER — Ambulatory Visit (INDEPENDENT_AMBULATORY_CARE_PROVIDER_SITE_OTHER): Payer: Medicare Other | Admitting: Internal Medicine

## 2021-01-02 VITALS — BP 112/74 | HR 68 | Ht 70.0 in | Wt 202.0 lb

## 2021-01-02 DIAGNOSIS — I2510 Atherosclerotic heart disease of native coronary artery without angina pectoris: Secondary | ICD-10-CM | POA: Diagnosis not present

## 2021-01-02 DIAGNOSIS — I255 Ischemic cardiomyopathy: Secondary | ICD-10-CM | POA: Diagnosis not present

## 2021-01-02 DIAGNOSIS — Z95810 Presence of automatic (implantable) cardiac defibrillator: Secondary | ICD-10-CM

## 2021-01-02 DIAGNOSIS — I4819 Other persistent atrial fibrillation: Secondary | ICD-10-CM

## 2021-01-02 DIAGNOSIS — I5022 Chronic systolic (congestive) heart failure: Secondary | ICD-10-CM | POA: Diagnosis not present

## 2021-01-02 NOTE — Patient Instructions (Addendum)
Medication Instructions:  Your physician recommends that you continue on your current medications as directed. Please refer to the Current Medication list given to you today.  Labwork: None ordered.  Testing/Procedures: None ordered.  Follow-Up: Your physician wants you to follow-up in: 6 months with Lewayne Bunting, MD or one of the following Advanced Practice Providers on your designated Care Team:   Francis Dowse, [place] Mohd. "Mardelle Matte" Lanna Poche, [place]  Remote monitoring is used to monitor your ICD from home. This monitoring reduces the number of office visits required to check your device to one time per year. It allows [place] to keep an eye on the functioning of your device to ensure it is working properly. You are scheduled for a device check from home on 03/01/2021. You may send your transmission at any time that day. If you have a wireless device, the transmission will be sent automatically. After your physician reviews your transmission, you will receive a postcard with your next transmission date.  Any Other Special Instructions Will Be Listed Below (If Applicable).  If you need a refill on your cardiac medications before your next appointment, please call your pharmacy.

## 2021-01-02 NOTE — Progress Notes (Signed)
HPI [name] returns today for followup. He is a pleasant 65 yo man with a  H/o VT, PAF, CAD, and ICD lead dysfunction s/p extraction and insertion of a new system due to noise on the ICD lead. In the interim he notes that he has gotten covid and he was almost asymptomatic despite not being vaccinated. He denies chest pain or sob. No Known Allergies   Current Outpatient Medications  Medication Sig Dispense Refill   acetaminophen (TYLENOL) 325 MG tablet Take 1-2 tablets (325-650 mg total) by mouth every 4 (four) hours as needed for mild pain.     amiodarone (PACERONE) 200 MG tablet Take 2 tablets (400 mg total) by mouth 2 (two) times daily for 4 days, THEN 2 tablets (400 mg total) daily for 5 days, THEN 1 tablet (200 mg total) daily. 56 tablet 0   atorvastatin (LIPITOR) 40 MG tablet TAKE 1 TABLET BY MOUTH DAILY AT 6 PM. 90 tablet 3   carvedilol (COREG) 6.25 MG tablet Take 2 tablets (12.5 mg total) by mouth 2 (two) times daily with a meal.     furosemide (LASIX) 40 MG tablet TAKE 1 TABLET BY MOUTH DAILY 90 tablet 3   Multiple Vitamins-Minerals (HAIR/SKIN/NAILS PO) Take 1 tablet by mouth daily.     spironolactone (ALDACTONE) 25 MG tablet TAKE 1 TABLET BY MOUTH DAILY. 90 tablet 3   warfarin (COUMADIN) 2.5 MG tablet Take 1/2 to 1 tablet daily as directed by coumadin clinic. 30 tablet 2   No current facility-administered medications for this visit.     Past Medical History:  Diagnosis Date   AICD (automatic cardioverter/defibrillator) present    Atrial fibrillation, chronic (HCC)    CAD (coronary artery disease)    a. s/p CABG x 3 03/2013 (L-LAD, S-RI, S-PDA) in [place]   CAP (community acquired pneumonia) 08/22/2015   CHF (congestive heart failure) (HCC)    Chronic combined systolic and diastolic heart failure (HCC) November 2014   Coronary artery disease    Full dentures    HLD (hyperlipidemia)     Hyperbilirubinemia    Ischemic cardiomyopathy    a. echo (10/15):  EF 20%, diff  HK, apical AK, mobile density inf wall near papillary muscle (?ruptured) with mod MR, mod reduced RVSF, mild PI, PASP 37 mmHg b. s/p CRTD    LBBB (left bundle branch block)     LV (left ventricular) mural thrombus     a. TEE (10/15):  EF 15-20%, septum and ant walls AK, lat wall HK, no papillary muscle rupture, probable small thrombus on ant/ant-septal wall, mild MR, mod decreased RVSF, neg bubble study   Mitral regurgitation     mild by TEE 02/2014   NSVT (nonsustained ventricular tachycardia) (HCC)         Obesity    Pulmonary hypertension (HCC)    Umbilical hernia    "have had it since age [age]"   Umbilical hernia    Wears glasses     ROS:   All systems reviewed and negative except as noted in the HPI.   Past Surgical History:  Procedure Laterality Date   BI-VENTRICULAR IMPLANTABLE CARDIOVERTER DEFIBRILLATOR N/A 09/01/2014   MDT CRTD implanted by Dr Ladona Ridgel   CARDIAC CATHETERIZATION  03/2013   CARDIOVERSION N/A 12/04/2017   Procedure: CARDIOVERSION;  Surgeon: Marinus Maw, MD;  Location: [facility] INVASIVE CV LAB;  Service: Cardiovascular;  Laterality: N/A;   CORONARY ARTERY BYPASS GRAFT  03/2013   LIMA to the  LAD, SVG to the ramus, SVG to the PDA   CORONARY ARTERY BYPASS GRAFT  2014   CABG "X3"   FRACTURE SURGERY     ICD LEAD REMOVAL N/A 08/14/2018   Procedure: ICD LEAD REMOVAL;  Surgeon: Marinus Maw, MD;  Location: [facility] OR;  Service: Cardiovascular;  Laterality: N/A;   INSERT / REPLACE / REMOVE PACEMAKER  2016   LEAD INSERTION N/A 08/14/2018   Procedure: LEAD INSERTION;  Surgeon: Marinus Maw, MD;  Location: [facility] OR;  Service: Cardiovascular;  Laterality: N/A;   LEAD REVISION/REPAIR N/A 12/04/2017   Procedure: LEAD REVISION/REPAIR;  Surgeon: Marinus Maw, MD;  Location: [facility] INVASIVE CV LAB;  Service: Cardiovascular;  Laterality: N/A;   MULTIPLE TOOTH EXTRACTIONS     PATELLA FRACTURE SURGERY Left 1972   TEE WITHOUT CARDIOVERSION N/A 03/04/2014   Procedure: TRANSESOPHAGEAL  ECHOCARDIOGRAM (TEE);  Surgeon: Laurey Morale, MD;  Location: [facility] ENDOSCOPY;  Service: Cardiovascular;  Laterality: N/A;   TEE WITHOUT CARDIOVERSION N/A 12/02/2017   Procedure: TRANSESOPHAGEAL ECHOCARDIOGRAM (TEE);  Surgeon: Jodelle Red, MD;  Location: [facility] ENDOSCOPY;  Service: Cardiovascular;  Laterality: N/A;     Family History  Problem Relation Age of Onset   Diabetes Mother        Deceased   Thyroid disease Mother      Social History   Socioeconomic History   Marital status: Divorced    Spouse name: Not on file   Number of children: Not on file   Years of education: Not on file   Highest education level: Not on file  Occupational History   Not on file  Tobacco Use   Smoking status: Every Day    Packs/day: 0.25    Years: 47.00    Pack years: 11.75    Types: Cigarettes   Smokeless tobacco: Never   Tobacco comments:    3-4 per day   Vaping Use   Vaping Use: Never used  Substance and Sexual Activity   Alcohol use: Not Currently    Alcohol/week: 0.0 standard drinks    Comment: "quit drinking when I was 23"   Drug use: Yes    Types: Marijuana    Comment:  "weekly" last use week of 08/07/2018   Sexual activity: Not Currently  Other Topics Concern   Not on file  Social History Narrative   ** Merged History Encounter **       Social Determinants of Health   Financial Resource Strain: Not on file  Food Insecurity: Not on file  Transportation Needs: Not on file  Physical Activity: Not on file  Stress: Not on file  Social Connections: Not on file  Intimate Partner Violence: Not on file     BP 112/74   Pulse 68   Ht 5\' 10"  (1.778 m)   Wt 202 lb (91.6 kg)   SpO2 97%   BMI 28.98 kg/m   Physical Exam:  Well appearing 65 yo man, NAD HEENT: Unremarkable Neck:  No JVD, no thyromegally Lymphatics:  No adenopathy Back:  No CVA tenderness Lungs:  Clear with no wheezes HEART:  Regular rate rhythm, no murmurs, no rubs, no clicks Abd:  soft, positive  bowel sounds, no organomegally, no rebound, no guarding Ext:  2 plus pulses, no edema, no cyanosis, no clubbing Skin:  No rashes no nodules Neuro:  CN II through XII intact, motor grossly intact  EKG - nsr with biv pacing  DEVICE  Normal device function.  See PaceArt for details.  Assess/Plan:  1. PAF - he is maintaining NSR on amiodarone. 2. ICD/VT - his medtronic Biv ICD is working normally. We note some noise on his atrial lead. We will follow. He has had appropriate ICD treatment for his VT. I reminded him about state of [place] driving restrictions. 3. Chronic systolic heart failure - his symptoms are class 2. He will continue his current meds. 4. CAD - he denies anginal symptoms. We will follow. 5. Covid 19 - he has recently gotten Covid but was essentially asymptomatic.   Sharlot Gowda Maylie Ashton,MD

## 2021-01-15 ENCOUNTER — Other Ambulatory Visit: Payer: Self-pay | Admitting: Internal Medicine

## 2021-02-05 ENCOUNTER — Other Ambulatory Visit: Payer: Self-pay | Admitting: Internal Medicine

## 2021-02-06 ENCOUNTER — Other Ambulatory Visit: Payer: Self-pay

## 2021-02-06 ENCOUNTER — Ambulatory Visit (INDEPENDENT_AMBULATORY_CARE_PROVIDER_SITE_OTHER): Payer: Medicare Other

## 2021-02-06 DIAGNOSIS — Z5181 Encounter for therapeutic drug level monitoring: Secondary | ICD-10-CM | POA: Diagnosis not present

## 2021-02-06 DIAGNOSIS — I513 Intracardiac thrombosis, not elsewhere classified: Secondary | ICD-10-CM

## 2021-02-06 LAB — POCT INR: INR: 2.2 (ref 2.0–3.0)

## 2021-02-06 NOTE — Patient Instructions (Signed)
continue 0.5 tablet daily except 1 tablet on Wednesday.  Recheck INR in 6 weeks.  Call Coumadin Clinic if placed on any new medications or scheduled for any procedures - [phone];   

## 2021-03-01 ENCOUNTER — Ambulatory Visit (INDEPENDENT_AMBULATORY_CARE_PROVIDER_SITE_OTHER): Payer: Medicare Other

## 2021-03-01 DIAGNOSIS — I255 Ischemic cardiomyopathy: Secondary | ICD-10-CM

## 2021-03-01 LAB — CUP PACEART REMOTE DEVICE CHECK
Battery Remaining Longevity: 36 mo
Battery Voltage: 2.96 V
Brady Statistic AP VP Percent: 84.18 %
Brady Statistic AP VS Percent: 0.15 %
Brady Statistic AS VP Percent: 15.32 %
Brady Statistic AS VS Percent: 0.36 %
Brady Statistic RA Percent Paced: 83.52 %
Brady Statistic RV Percent Paced: 98.28 %
Date Time Interrogation Session: 20221013044223
HighPow Impedance: 86 Ohm
Implantable Lead Implant Date: 20160414
Implantable Lead Implant Date: 20160414
Implantable Lead Implant Date: 20200327
Implantable Lead Location: 753858
Implantable Lead Location: 753859
Implantable Lead Location: 753860
Implantable Lead Model: 4398
Implantable Lead Model: 5076
Implantable Lead Model: 6935
Implantable Pulse Generator Implant Date: 20200327
Lead Channel Impedance Value: 216.848
Lead Channel Impedance Value: 216.848
Lead Channel Impedance Value: 216.848
Lead Channel Impedance Value: 237.5 Ohm
Lead Channel Impedance Value: 237.5 Ohm
Lead Channel Impedance Value: 342 Ohm
Lead Channel Impedance Value: 399 Ohm
Lead Channel Impedance Value: 475 Ohm
Lead Channel Impedance Value: 475 Ohm
Lead Channel Impedance Value: 475 Ohm
Lead Channel Impedance Value: 475 Ohm
Lead Channel Impedance Value: 475 Ohm
Lead Channel Impedance Value: 494 Ohm
Lead Channel Impedance Value: 722 Ohm
Lead Channel Impedance Value: 722 Ohm
Lead Channel Impedance Value: 760 Ohm
Lead Channel Impedance Value: 779 Ohm
Lead Channel Impedance Value: 779 Ohm
Lead Channel Pacing Threshold Amplitude: 0.625 V
Lead Channel Pacing Threshold Amplitude: 1.125 V
Lead Channel Pacing Threshold Amplitude: 2.125 V
Lead Channel Pacing Threshold Pulse Width: 0.4 ms
Lead Channel Pacing Threshold Pulse Width: 0.4 ms
Lead Channel Pacing Threshold Pulse Width: 0.8 ms
Lead Channel Sensing Intrinsic Amplitude: 1.5 mV
Lead Channel Sensing Intrinsic Amplitude: 1.5 mV
Lead Channel Sensing Intrinsic Amplitude: 19.625 mV
Lead Channel Sensing Intrinsic Amplitude: 19.625 mV
Lead Channel Setting Pacing Amplitude: 1.5 V
Lead Channel Setting Pacing Amplitude: 2.5 V
Lead Channel Setting Pacing Amplitude: 2.5 V
Lead Channel Setting Pacing Pulse Width: 0.4 ms
Lead Channel Setting Pacing Pulse Width: 0.8 ms
Lead Channel Setting Sensing Sensitivity: 0.3 mV

## 2021-03-09 NOTE — Progress Notes (Signed)
Remote ICD transmission.   

## 2021-03-14 ENCOUNTER — Telehealth: Payer: Self-pay

## 2021-03-14 NOTE — Telephone Encounter (Signed)
Pt received notified for Defib lead impedence.   Reset notified via carelink, advised pt if alert happens again we will need to test in person.  Pt provided with phone number to call device clinic directly.

## 2021-03-14 NOTE — Telephone Encounter (Signed)
The patient states his ICD was beeping. I asked him was he around any magnets? He states no. He also states he feels fine. I asked him to send a transmission. Transmission received.

## 2021-03-15 ENCOUNTER — Other Ambulatory Visit: Payer: Self-pay

## 2021-03-15 ENCOUNTER — Ambulatory Visit (INDEPENDENT_AMBULATORY_CARE_PROVIDER_SITE_OTHER): Payer: Medicare Other

## 2021-03-15 ENCOUNTER — Telehealth: Payer: Self-pay

## 2021-03-15 DIAGNOSIS — I5022 Chronic systolic (congestive) heart failure: Secondary | ICD-10-CM

## 2021-03-15 LAB — CUP PACEART INCLINIC DEVICE CHECK
Battery Remaining Longevity: 35 mo
Battery Voltage: 2.92 V
Brady Statistic AP VP Percent: 84.78 %
Brady Statistic AP VS Percent: 0.17 %
Brady Statistic AS VP Percent: 14.68 %
Brady Statistic AS VS Percent: 0.37 %
Brady Statistic RA Percent Paced: 84.17 %
Brady Statistic RV Percent Paced: 98.38 %
Date Time Interrogation Session: 20221027111015
HighPow Impedance: 95 Ohm
Implantable Lead Implant Date: 20160414
Implantable Lead Implant Date: 20160414
Implantable Lead Implant Date: 20200327
Implantable Lead Location: 753858
Implantable Lead Location: 753859
Implantable Lead Location: 753860
Implantable Lead Model: 4398
Implantable Lead Model: 5076
Implantable Lead Model: 6935
Implantable Pulse Generator Implant Date: 20200327
Lead Channel Impedance Value: 222.34 Ohm
Lead Channel Impedance Value: 222.34 Ohm
Lead Channel Impedance Value: 226.417
Lead Channel Impedance Value: 237.5 Ohm
Lead Channel Impedance Value: 242.157
Lead Channel Impedance Value: 399 Ohm
Lead Channel Impedance Value: 418 Ohm
Lead Channel Impedance Value: 437 Ohm
Lead Channel Impedance Value: 475 Ohm
Lead Channel Impedance Value: 475 Ohm
Lead Channel Impedance Value: 494 Ohm
Lead Channel Impedance Value: 494 Ohm
Lead Channel Impedance Value: 494 Ohm
Lead Channel Impedance Value: 703 Ohm
Lead Channel Impedance Value: 722 Ohm
Lead Channel Impedance Value: 722 Ohm
Lead Channel Impedance Value: 779 Ohm
Lead Channel Impedance Value: 779 Ohm
Lead Channel Pacing Threshold Amplitude: 0.75 V
Lead Channel Pacing Threshold Amplitude: 1 V
Lead Channel Pacing Threshold Amplitude: 2 V
Lead Channel Pacing Threshold Pulse Width: 0.4 ms
Lead Channel Pacing Threshold Pulse Width: 0.4 ms
Lead Channel Pacing Threshold Pulse Width: 0.8 ms
Lead Channel Sensing Intrinsic Amplitude: 0.75 mV
Lead Channel Sensing Intrinsic Amplitude: 19.75 mV
Lead Channel Setting Pacing Amplitude: 1.5 V
Lead Channel Setting Pacing Amplitude: 2.5 V
Lead Channel Setting Pacing Amplitude: 2.5 V
Lead Channel Setting Pacing Pulse Width: 0.4 ms
Lead Channel Setting Pacing Pulse Width: 0.8 ms
Lead Channel Setting Sensing Sensitivity: 0.3 mV

## 2021-03-15 NOTE — Telephone Encounter (Signed)
The patient states his ICD beeped again last night. He sent a transmission. Joseph Carson asked me to bring him in today at 10:40 am. The patient agreed to come to the appointment.

## 2021-03-15 NOTE — Progress Notes (Signed)
CRT-D device check in office due to device alert. Thresholds and sensing consistent with previous device measurements. Lead impedance trends stable over time, with exception of noted RV defib impedence >160 on 03/13/21.  lead impedence testing today reflected RV defib impedence of 96 ohms consistent with trends, reviewed with Dr. Elberta Fortis in office, device alert reset and patient educated to notify office if alert occurs again.    Patient bi-ventricularly pacing 98.4% of the time. Device programmed with appropriate safety margins. Heart failure diagnostics reviewed and trends are stable for patient. Audible/vibratory alerts demonstrated for patient. No changes made this session. Estimated longevity 2.9years.  Patient enrolled in remote follow up, next scheduled check 05/31/21. ROV with Dr. Ladona Ridgel as scheduled on 07/10/21.

## 2021-03-15 NOTE — Telephone Encounter (Signed)
Alert remote reviewed. Normal device function.   RV defib elevated at 161 ohms.  LV threshold higher than programmed, on monitor only, effective CRT pacing only 83.8% of the time. One AF episode that was 32 seconds. Next remote 05/31/2021. Hassell Halim, RN, CCDS, [employer]  Patient will be seen today in device clinic.

## 2021-03-20 ENCOUNTER — Other Ambulatory Visit: Payer: Self-pay

## 2021-03-20 ENCOUNTER — Ambulatory Visit (INDEPENDENT_AMBULATORY_CARE_PROVIDER_SITE_OTHER): Payer: Medicare Other

## 2021-03-20 DIAGNOSIS — Z5181 Encounter for therapeutic drug level monitoring: Secondary | ICD-10-CM

## 2021-03-20 LAB — POCT INR: INR: 2.2 (ref 2.0–3.0)

## 2021-03-20 NOTE — Patient Instructions (Signed)
Description   continue 0.5 tablet daily except 1 tablet on Wednesday.  Recheck INR in 6 weeks.  Call Coumadin Clinic if placed on any new medications or scheduled for any procedures - [phone];

## 2021-03-26 ENCOUNTER — Telehealth: Payer: Self-pay

## 2021-03-26 ENCOUNTER — Emergency Department (HOSPITAL_COMMUNITY)
Admission: EM | Admit: 2021-03-26 | Discharge: 2021-03-26 | Disposition: A | Discharge status: Home / Self Care | Payer: Medicare Other | Attending: Emergency Medicine | Admitting: Emergency Medicine

## 2021-03-26 ENCOUNTER — Other Ambulatory Visit: Payer: Self-pay

## 2021-03-26 ENCOUNTER — Emergency Department (HOSPITAL_COMMUNITY): Payer: Medicare Other

## 2021-03-26 DIAGNOSIS — Z7901 Long term (current) use of anticoagulants: Secondary | ICD-10-CM | POA: Insufficient documentation

## 2021-03-26 DIAGNOSIS — R29898 Other symptoms and signs involving the musculoskeletal system: Secondary | ICD-10-CM

## 2021-03-26 DIAGNOSIS — R202 Paresthesia of skin: Secondary | ICD-10-CM | POA: Insufficient documentation

## 2021-03-26 DIAGNOSIS — Z955 Presence of coronary angioplasty implant and graft: Secondary | ICD-10-CM | POA: Diagnosis not present

## 2021-03-26 DIAGNOSIS — I2510 Atherosclerotic heart disease of native coronary artery without angina pectoris: Secondary | ICD-10-CM | POA: Insufficient documentation

## 2021-03-26 DIAGNOSIS — R531 Weakness: Secondary | ICD-10-CM | POA: Insufficient documentation

## 2021-03-26 DIAGNOSIS — Z79899 Other long term (current) drug therapy: Secondary | ICD-10-CM | POA: Diagnosis not present

## 2021-03-26 DIAGNOSIS — G720 Drug-induced myopathy: Secondary | ICD-10-CM | POA: Diagnosis not present

## 2021-03-26 DIAGNOSIS — I4891 Unspecified atrial fibrillation: Secondary | ICD-10-CM | POA: Diagnosis not present

## 2021-03-26 DIAGNOSIS — I5042 Chronic combined systolic (congestive) and diastolic (congestive) heart failure: Secondary | ICD-10-CM | POA: Diagnosis not present

## 2021-03-26 DIAGNOSIS — T466X5A Adverse effect of antihyperlipidemic and antiarteriosclerotic drugs, initial encounter: Secondary | ICD-10-CM | POA: Diagnosis not present

## 2021-03-26 DIAGNOSIS — Z95810 Presence of automatic (implantable) cardiac defibrillator: Secondary | ICD-10-CM | POA: Insufficient documentation

## 2021-03-26 DIAGNOSIS — I110 Hypertensive heart disease with heart failure: Secondary | ICD-10-CM | POA: Diagnosis not present

## 2021-03-26 DIAGNOSIS — Z8616 Personal history of COVID-19: Secondary | ICD-10-CM | POA: Insufficient documentation

## 2021-03-26 DIAGNOSIS — F17210 Nicotine dependence, cigarettes, uncomplicated: Secondary | ICD-10-CM | POA: Diagnosis not present

## 2021-03-26 LAB — CBC
HCT: 49.9 % (ref 39.0–52.0)
Hemoglobin: 17 g/dL (ref 13.0–17.0)
MCH: 30.3 pg (ref 26.0–34.0)
MCHC: 34.1 g/dL (ref 30.0–36.0)
MCV: 88.9 fL (ref 80.0–100.0)
Platelets: 160 10*3/uL (ref 150–400)
RBC: 5.61 MIL/uL (ref 4.22–5.81)
RDW: 13.8 % (ref 11.5–15.5)
WBC: 10.4 10*3/uL (ref 4.0–10.5)
nRBC: 0 % (ref 0.0–0.2)

## 2021-03-26 LAB — URINALYSIS, ROUTINE W REFLEX MICROSCOPIC
Bilirubin Urine: NEGATIVE
Glucose, UA: NEGATIVE mg/dL
Hgb urine dipstick: NEGATIVE
Ketones, ur: NEGATIVE mg/dL
Leukocytes,Ua: NEGATIVE
Nitrite: NEGATIVE
Protein, ur: NEGATIVE mg/dL
Specific Gravity, Urine: 1.027 (ref 1.005–1.030)
pH: 5 (ref 5.0–8.0)

## 2021-03-26 LAB — BASIC METABOLIC PANEL
Anion gap: 10 (ref 5–15)
BUN: 21 mg/dL (ref 8–23)
CO2: 24 mmol/L (ref 22–32)
Calcium: 9 mg/dL (ref 8.9–10.3)
Chloride: 100 mmol/L (ref 98–111)
Creatinine, Ser: 1.35 mg/dL — ABNORMAL HIGH (ref 0.61–1.24)
GFR, Estimated: 58 mL/min — ABNORMAL LOW (ref >60)
Glucose, Bld: 152 mg/dL — ABNORMAL HIGH (ref 70–99)
Potassium: 3.8 mmol/L (ref 3.5–5.1)
Sodium: 134 mmol/L — ABNORMAL LOW (ref 135–145)

## 2021-03-26 LAB — CK: Total CK: 166 U/L (ref 49–397)

## 2021-03-26 LAB — FOLATE: Folate: 20.7 ng/mL (ref >5.9)

## 2021-03-26 LAB — VITAMIN B12: Vitamin B-12: 524 pg/mL (ref 180–914)

## 2021-03-26 LAB — MAGNESIUM: Magnesium: 1.9 mg/dL (ref 1.7–2.4)

## 2021-03-26 LAB — TSH: TSH: 30.704 u[IU]/mL — ABNORMAL HIGH (ref 0.350–4.500)

## 2021-03-26 NOTE — Discharge Instructions (Addendum)
You were seen this evening for evaluation of your episodes of bilateral lower extremity weakness.  Electrophysiology who manages your amiodarone was consulted, they have low suspicion that your amiodarone is causing the symptoms, and stressed the importance of continuing to take your amiodarone and giving her history of cardiac arrhythmias.  They have given you an appointment to see them in the office next week.  Please attend this appointment.  Additionally, you were seen by neurology Dr. Derry Lory this evening who have thoroughly evaluated you and suspect that your symptoms are likely due to vitamin deficiency.  Therefore I would like for you to start taking a multivitamin with multimineral tablet over-the-counter daily.  Is also recommended that you stop your Lipitor as your symptoms could be due to toxicity related to this.  I have given you Dr. Tollie Eth office to call tomorrow to schedule appointment for follow-up.  Additionally, please return if you develop new or worsening symptoms.

## 2021-03-26 NOTE — ED Notes (Signed)
Neuro provider bedside 

## 2021-03-26 NOTE — ED Provider Notes (Signed)
[facility] EMERGENCY DEPARTMENT Provider Note   CSN: [id number] Arrival date & time: 03/26/21  0102     History Chief Complaint  Patient presents with  . Weakness  . Numbness  . Fatigue    Jaiven Graveline is a 65 y.o. male.  65 year old male presents today for evaluation of multiple episodes of intermittent bilateral lower extremity weakness and numbness without associated back or lower extremity pain.  Patient reports his first episode was Thursday morning following waking up when he noticed that both of his legs were numb and weak and he was unable to move them.  He reports he laid in bed for about 30 minutes until his symptoms completely went away, and then he was able to resume his daily activities.  He reports he was fine until yesterday when he was standing in the bathroom and symptoms recurred causing him to fall to the ground between the toilet and the tub.  He denies head trauma during this fall.  He crawled to bed following the fall, and stayed in bed until this morning because he was alone in the house and was afraid of another episode. He denies new shortness of breath, chest pain, palpitations, lightheadedness, visual changes, speech changes, or nausea around these episodes.    The history is provided by the patient. No language interpreter was used.      Past Medical History:  Diagnosis Date  . AICD (automatic cardioverter/defibrillator) present   . Atrial fibrillation, chronic (HCC)   . CAD (coronary artery disease)    a. s/p CABG x 3 03/2013 (L-LAD, S-RI, S-PDA) in [place]  . CAP (community acquired pneumonia) 08/22/2015  . CHF (congestive heart failure) (HCC)   . Chronic combined systolic and diastolic heart failure [facility]) November 2014  . Coronary artery disease   . Full dentures   . HLD (hyperlipidemia)    . Hyperbilirubinemia   . Ischemic cardiomyopathy    a. echo (10/15):  EF 20%, diff HK, apical AK, mobile density inf wall near papillary  muscle (?ruptured) with mod MR, mod reduced RVSF, mild PI, PASP 37 mmHg b. s/p CRTD   . LBBB (left bundle branch block)    . LV (left ventricular) mural thrombus     a. TEE (10/15):  EF 15-20%, septum and ant walls AK, lat wall HK, no papillary muscle rupture, probable small thrombus on ant/ant-septal wall, mild MR, mod decreased RVSF, neg bubble study  . Mitral regurgitation     mild by TEE 02/2014  . NSVT (nonsustained ventricular tachycardia) (HCC)        . Obesity   . Pulmonary hypertension (HCC)   . Umbilical hernia    "have had it since age [age]"  . Umbilical hernia   . Wears glasses     Patient Active Problem List   Diagnosis Date Noted  . AICD discharge   . Abnormal thyroid blood test   . COVID-19 11/30/2020  . Malfunction of implantable defibrillator ventricular (ICD) lead 08/14/2018  . Visit for monitoring Tikosyn therapy 12/02/2017  . Persistent atrial fibrillation (HCC)   . Colon cancer screening 10/04/2015  . CAP (community acquired pneumonia) 08/22/2015  . Warfarin-induced coagulopathy (HCC) 08/22/2015  . Biventricular automatic implantable cardioverter defibrillator in situ 09/01/2014  . Encounter for therapeutic drug monitoring 08/25/2014  . Chronic systolic CHF (congestive heart failure) (HCC) 03/10/2014  . Mitral regurgitation 03/06/2014  . LV (left ventricular) mural thrombus 03/06/2014  . NSVT (nonsustained ventricular tachycardia) 03/06/2014  .  LBBB (left bundle branch block) 03/06/2014  . HLD (hyperlipidemia) 03/06/2014  . Coronary artery disease 03/01/2014  . Tobacco abuse 03/01/2014  . Pulmonary hypertension (HCC) 03/01/2014    Past Surgical History:  Procedure Laterality Date  . BI-VENTRICULAR IMPLANTABLE CARDIOVERTER DEFIBRILLATOR N/A 09/01/2014   MDT CRTD implanted by Dr Ladona Ridgel  . CARDIAC CATHETERIZATION  03/2013  . CARDIOVERSION N/A 12/04/2017   Procedure: CARDIOVERSION;  Surgeon: Marinus Maw, MD;  Location: [facility] INVASIVE CV LAB;  Service:  Cardiovascular;  Laterality: N/A;  . CORONARY ARTERY BYPASS GRAFT  03/2013   LIMA to the LAD, SVG to the ramus, SVG to the PDA  . CORONARY ARTERY BYPASS GRAFT  2014   CABG "X3"  . FRACTURE SURGERY    . ICD LEAD REMOVAL N/A 08/14/2018   Procedure: ICD LEAD REMOVAL;  Surgeon: Marinus Maw, MD;  Location: [facility] OR;  Service: Cardiovascular;  Laterality: N/A;  . INSERT / REPLACE / REMOVE PACEMAKER  2016  . LEAD INSERTION N/A 08/14/2018   Procedure: LEAD INSERTION;  Surgeon: Marinus Maw, MD;  Location: [facility] OR;  Service: Cardiovascular;  Laterality: N/A;  . LEAD REVISION/REPAIR N/A 12/04/2017   Procedure: LEAD REVISION/REPAIR;  Surgeon: Marinus Maw, MD;  Location: [facility] INVASIVE CV LAB;  Service: Cardiovascular;  Laterality: N/A;  . MULTIPLE TOOTH EXTRACTIONS    . PATELLA FRACTURE SURGERY Left 1972  . TEE WITHOUT CARDIOVERSION N/A 03/04/2014   Procedure: TRANSESOPHAGEAL ECHOCARDIOGRAM (TEE);  Surgeon: Laurey Morale, MD;  Location: [facility] ENDOSCOPY;  Service: Cardiovascular;  Laterality: N/A;  . TEE WITHOUT CARDIOVERSION N/A 12/02/2017   Procedure: TRANSESOPHAGEAL ECHOCARDIOGRAM (TEE);  Surgeon: Jodelle Red, MD;  Location: [facility] ENDOSCOPY;  Service: Cardiovascular;  Laterality: N/A;       Family History  Problem Relation Age of Onset  . Diabetes Mother        Deceased  . Thyroid disease Mother     Social History   Tobacco Use  . Smoking status: Every Day    Packs/day: 0.25    Years: 47.00    Pack years: 11.75    Types: Cigarettes  . Smokeless tobacco: Never  . Tobacco comments:    3-4 per day   Vaping Use  . Vaping Use: Never used  Substance Use Topics  . Alcohol use: Not Currently    Alcohol/week: 0.0 standard drinks    Comment: "quit drinking when I was 23"  . Drug use: Yes    Types: Marijuana    Comment:  "weekly" last use week of 08/07/2018    Home Medications Prior to Admission medications   Medication Sig Start Date End Date Taking? Authorizing Provider   acetaminophen (TYLENOL) 325 MG tablet Take 1-2 tablets (325-650 mg total) by mouth every 4 (four) hours as needed for mild pain. 09/02/14   Leone Brand, NP  amiodarone (PACERONE) 200 MG tablet TAKE 2 TABLETS TWICE A DAY FOR 4 DAYS, THEN 2 TABLETS DAILY FOR 5 DAYS, THEN 1 TABLET DAILY 02/05/21   Marinus Maw, MD  atorvastatin (LIPITOR) 40 MG tablet TAKE 1 TABLET BY MOUTH DAILY AT 6 PM. 01/16/21   Marinus Maw, MD  carvedilol (COREG) 6.25 MG tablet TAKE 2 TABLETS WITH FOOD TWICE DAILY 01/16/21   Marinus Maw, MD  furosemide (LASIX) 40 MG tablet TAKE 1 TABLET BY MOUTH DAILY 01/16/21   Marinus Maw, MD  Multiple Vitamins-Minerals (HAIR/SKIN/NAILS PO) Take 1 tablet by mouth daily.    [provider]  spironolactone (ALDACTONE)  25 MG tablet TAKE 1 TABLET BY MOUTH DAILY. 01/16/21   Evans Lance, MD  warfarin (COUMADIN) 5 MG tablet TAKE 1/2 TO 1 TABLET BY MOUTH DAILY AS DIRECTED BY COUMADIN CLINIC 01/15/21   Evans Lance, MD    Allergies    Patient has no known allergies.  Review of Systems   Review of Systems  Constitutional:  Positive for activity change and diaphoresis. Negative for appetite change, chills and fever.  Eyes:  Negative for visual disturbance.  Respiratory:  Negative for shortness of breath.   Cardiovascular:  Negative for chest pain, palpitations and leg swelling.  Gastrointestinal:  Negative for abdominal pain, nausea and vomiting.  Genitourinary:  Negative for difficulty urinating.  Musculoskeletal:  Negative for back pain.  Neurological:  Positive for weakness and numbness. Negative for syncope, facial asymmetry, light-headedness and headaches.  All other systems reviewed and are negative.  Physical Exam Updated Vital Signs BP 114/81 (BP Location: Right Arm)   Pulse (!) 59   Temp 97.7 F (36.5 C) (Oral)   Resp 18   SpO2 97%   Physical Exam Vitals and nursing note reviewed.  Constitutional:      General: He is not in acute distress.     Appearance: Normal appearance. He is not ill-appearing.  HENT:     Head: Normocephalic and atraumatic.     Nose: Nose normal.  Eyes:     General: No scleral icterus.    Extraocular Movements: Extraocular movements intact.     Conjunctiva/sclera: Conjunctivae normal.  Cardiovascular:     Rate and Rhythm: Normal rate and regular rhythm.     Pulses: Normal pulses.  Pulmonary:     Effort: Pulmonary effort is normal. No respiratory distress.     Breath sounds: Normal breath sounds. No wheezing.  Abdominal:     General: There is no distension.     Tenderness: There is no abdominal tenderness.  Musculoskeletal:        General: Normal range of motion.     Cervical back: Normal range of motion.     Right lower leg: No edema.     Left lower leg: No edema.     Comments: Cervical, thoracic, lumbar spine without tenderness to palpation.  Thoracic and lumbar paraspinal muscles without tenderness to palpation.  Without visual swelling or deformity to spine.  Skin:    General: Skin is warm and dry.  Neurological:     General: No focal deficit present.     Mental Status: He is alert. Mental status is at baseline.     Comments: Lower extremity strength in bilateral hips, knees, and ankles is 5/5.  Sensation intact in bilateral lower extremity.  DP pulses are 2+ and symmetrical.    ED Results / Procedures / Treatments   Labs (all labs ordered are listed, but only abnormal results are displayed) Labs Reviewed  BASIC METABOLIC PANEL - Abnormal; Notable for the following components:      Result Value   Sodium 134 (*)    Glucose, Bld 152 (*)    Creatinine, Ser 1.35 (*)    GFR, Estimated 58 (*)    All other components within normal limits  URINALYSIS, ROUTINE W REFLEX MICROSCOPIC - Abnormal; Notable for the following components:   APPearance HAZY (*)    All other components within normal limits  CBC  MAGNESIUM  CBG MONITORING, ED    EKG EKG Interpretation  Date/Time:  Monday March 26 2021 07:05:18 EST Ventricular Rate:  62 PR Interval:  196 QRS Duration: 208 QT Interval:  574 QTC Calculation: 582 R Axis:   258 Text Interpretation: Atrial-sensed ventricular-paced rhythm Abnormal ECG Confirmed by Thamas Jaegers ([id number]) on 03/26/2021 11:07:44 AM  Radiology No results found.  Procedures Procedures   Medications Ordered in ED Medications - No data to display  ED Course  I have reviewed the triage vital signs and the nursing notes.  Pertinent labs & imaging results that were available during my care of the patient were reviewed by me and considered in my medical decision making (see chart for details).  Clinical Course as of 03/26/21 1524  Mon Mar 26, 2021  1350 Device interrogation without evidence of arrhythmias.  Patient and daughter both concerned that patient's presenting symptoms are secondary to his amiodarone started 3 months ago.  Their concerns were related to EP APP Renee who believes that this is unlikely, but they will schedule him a clinic appointment for next week to discuss this.  They recommend continuing amiodarone until the follow-up appointment.  [AA]  1524 TSH [AA]    Clinical Course User Index [AA] Evlyn Courier, PA-C   MDM Rules/Calculators/A&P                           65 year old male presents today for evaluation of multiple episodes of bilateral lower extremity weakness and numbness.  Patient had a fall denies head trauma, loss of consciousness.  Patient is without fever or spinous process tenderness.  Device interrogation ordered.  Case discussed with neurology who will evaluate patient.  Patient CBC is unremarkable.  BMP with sodium of 134, glucose of 152, creatinine 1.35 otherwise unremarkable.  UA without UTI.  CT head without contrast negative for acute intracranial processes.  TSH ordered for concern of amnio toxicity.  EKG without acute ischemic changes.  Renee EP APP recommends patient continue given history of V. tach/V. fib until he  follows up in clinic next week and discussed this his concerns regarding amiodarone.  Still awaiting neurology evaluation.  Pending neurology evaluation I have discussed the above with patient and daughter and they voiced understanding. Patient at the end of the shift signed out to oncoming provider for appropriate follow-up and disposition.    Final Clinical Impression(s) / ED Diagnoses Final diagnoses:  None    Rx / DC Orders ED Discharge Orders     None        Evlyn Courier, PA-C 03/26/21 1523    Luna Fuse, MD 03/26/21 1525

## 2021-03-26 NOTE — ED Triage Notes (Signed)
Pt. Stated, Lavenia Atlas started a medication ? Name but this is one of the side effects of weakness and numbness. I had an episode on Friday or Saturday of weakness and my legs were just totally numb. I couldn't even move. The same happened yesterday and I couldn't even get off the floor and had to crawl back to bed. The medication is for my heart. Dr. Ladona Ridgel is his Dr.

## 2021-03-26 NOTE — Telephone Encounter (Signed)
Patient daughter Joseph Carson called in stating he has not been feeling well and she thinks it has something to do with him being on amiodarone. Patient daughter states he is so weak and sweating a lot and she is in the ED right now since he does not feel well and wanted to make you aware. Patient has spoken to a device nurse regarding his recent changes

## 2021-03-26 NOTE — Consult Note (Addendum)
NEUROLOGY CONSULTATION NOTE   Date of service: March 26, 2021 Patient Name: Joseph Carson MRN:  [id number] DOB:  10-Jan-1956 Reason for consult: "BL lower extremity weakness" Requesting Provider: Norman Clay, MD. _ _ _   _ __   _ __ _ _  __ __   _ __   __ _  History of Present Illness   Isaac Dubie is a 65 y.o. male with a medical history significant for chronic atrial fibrillation, CAD s/p CABG x3, coronary artery disease, combined systolic and diastolic heart failure s/p AICD placement, and hyperlipidemia who presented to the ED for evaluation of intermittent bilateral lower extremity weakness.  Patient states that on Thursday 11/3 he attempted to get up out of bed and swung his feet over the side before he realized that he was too weak to stand on his legs.  He states that he had to lay back in bed for about 30 minutes before his strength came back and he was able to walk again.  He had another episode on Sunday when he went to walk to the restroom and his legs "went out" causing him to fall and being wedged between the toilet and the tub.  He crawled to his bed and laid there throughout the night because he was scared to get up again and fall without anybody being at home.  He states that he has not noticed any sensory deficits, speech disturbance, palpitations, or arm weakness during these episodes but that he does become extremely diaphoretic. He denies any injury during the fall or leg pain before or after the onset of weakness. No saddle anesthesia, no urinary or bowel incontinence or retention/constipation. Does not endorse lhermitte's sign.  Patient does state that he feels that the symptoms are related to his amiodarone that was started approximately 3 months ago. He also endorses feeling like he was getting sick on Wednesday with a sore throat and feeling generally ill but without fevers and he endorses that he used Listerine and that took care of it without further  symptoms of sickness. He still feels tired and lethargic thou. Endorses avoiding green vegetables from his diet in general and poor diet in general. Also reports that he walks very little and spends most of his time sitting of lying down. He feels he has been getting weaker and weaker and just recently started doing some bicep curls. He has a tread mill and they are planning on getting it out of his daughter's room so everyone can use it.   ROS   Constitutional Denies weight loss, fever and chills.   HEENT Denies changes in vision and hearing.   Respiratory Denies SOB and cough.   CV Denies palpitations and CP   GI Denies abdominal pain, nausea, vomiting and diarrhea.   GU Denies dysuria and urinary frequency.   MSK Denies myalgia and joint pain.   Skin Denies rash and pruritus.   Neurological Denies headache and syncope.   Psychiatric Denies recent changes in mood. Denies anxiety and depression.    Past History   Past Medical History:  Diagnosis Date   AICD (automatic cardioverter/defibrillator) present    Atrial fibrillation, chronic (HCC)    CAD (coronary artery disease)    a. s/p CABG x 3 03/2013 (L-LAD, S-RI, S-PDA) in [place]   CAP (community acquired pneumonia) 08/22/2015   CHF (congestive heart failure) (HCC)    Chronic combined systolic and diastolic heart failure [facility]) November 2014   Coronary artery disease  Full dentures    HLD (hyperlipidemia)     Hyperbilirubinemia    Ischemic cardiomyopathy    a. echo (10/15):  EF 20%, diff HK, apical AK, mobile density inf wall near papillary muscle (?ruptured) with mod MR, mod reduced RVSF, mild PI, PASP 37 mmHg b. s/p CRTD    LBBB (left bundle branch block)     LV (left ventricular) mural thrombus     a. TEE (10/15):  EF 15-20%, septum and ant walls AK, lat wall HK, no papillary muscle rupture, probable small thrombus on ant/ant-septal wall, mild MR, mod decreased RVSF, neg bubble study   Mitral regurgitation     mild by TEE 02/2014    NSVT (nonsustained ventricular tachycardia) (HCC)         Obesity    Pulmonary hypertension (HCC)    Umbilical hernia    "have had it since age [age]"   Umbilical hernia    Wears glasses    Past Surgical History:  Procedure Laterality Date   BI-VENTRICULAR IMPLANTABLE CARDIOVERTER DEFIBRILLATOR N/A 09/01/2014   MDT CRTD implanted by Dr Ladona Ridgel   CARDIAC CATHETERIZATION  03/2013   CARDIOVERSION N/A 12/04/2017   Procedure: CARDIOVERSION;  Surgeon: Marinus Maw, MD;  Location: [facility] INVASIVE CV LAB;  Service: Cardiovascular;  Laterality: N/A;   CORONARY ARTERY BYPASS GRAFT  03/2013   LIMA to the LAD, SVG to the ramus, SVG to the PDA   CORONARY ARTERY BYPASS GRAFT  2014   CABG "X3"   FRACTURE SURGERY     ICD LEAD REMOVAL N/A 08/14/2018   Procedure: ICD LEAD REMOVAL;  Surgeon: Marinus Maw, MD;  Location: [facility] OR;  Service: Cardiovascular;  Laterality: N/A;   INSERT / REPLACE / REMOVE PACEMAKER  2016   LEAD INSERTION N/A 08/14/2018   Procedure: LEAD INSERTION;  Surgeon: Marinus Maw, MD;  Location: [facility] OR;  Service: Cardiovascular;  Laterality: N/A;   LEAD REVISION/REPAIR N/A 12/04/2017   Procedure: LEAD REVISION/REPAIR;  Surgeon: Marinus Maw, MD;  Location: [facility] INVASIVE CV LAB;  Service: Cardiovascular;  Laterality: N/A;   MULTIPLE TOOTH EXTRACTIONS     PATELLA FRACTURE SURGERY Left 1972   TEE WITHOUT CARDIOVERSION N/A 03/04/2014   Procedure: TRANSESOPHAGEAL ECHOCARDIOGRAM (TEE);  Surgeon: Laurey Morale, MD;  Location: [facility] ENDOSCOPY;  Service: Cardiovascular;  Laterality: N/A;   TEE WITHOUT CARDIOVERSION N/A 12/02/2017   Procedure: TRANSESOPHAGEAL ECHOCARDIOGRAM (TEE);  Surgeon: Jodelle Red, MD;  Location: [facility] ENDOSCOPY;  Service: Cardiovascular;  Laterality: N/A;   Family History  Problem Relation Age of Onset   Diabetes Mother        Deceased   Thyroid disease Mother    Social History   Socioeconomic History   Marital status: Divorced    Spouse name: Not on  file   Number of children: Not on file   Years of education: Not on file   Highest education level: Not on file  Occupational History   Not on file  Tobacco Use   Smoking status: Every Day    Packs/day: 0.25    Years: 47.00    Pack years: 11.75    Types: Cigarettes   Smokeless tobacco: Never   Tobacco comments:    3-4 per day   Vaping Use   Vaping Use: Never used  Substance and Sexual Activity   Alcohol use: Not Currently    Alcohol/week: 0.0 standard drinks    Comment: "quit drinking when I was 23"   Drug use: Yes  Types: Marijuana    Comment:  "weekly" last use week of 08/07/2018   Sexual activity: Not Currently  Other Topics Concern   Not on file  Social History Narrative   ** Merged History Encounter **       Social Determinants of Health   Financial Resource Strain: Not on file  Food Insecurity: Not on file  Transportation Needs: Not on file  Physical Activity: Not on file  Stress: Not on file  Social Connections: Not on file   No Known Allergies  Medications  (Not in a hospital admission)    Vitals   Vitals:   03/26/21 1541 03/26/21 1722 03/26/21 1841 03/26/21 2031  BP: 115/67 115/73 120/75 (!) 135/119  Pulse: (!) 57 60 60 67  Resp: 15 15 15 17   Temp: 98.4 F (36.9 C) 97.9 F (36.6 C) 97.7 F (36.5 C) 98 F (36.7 C)  TempSrc: Oral  Oral Oral  SpO2: 97% 95% 95% 97%  Weight:    99.8 kg  Height:    5\' 10"  (1.778 m)     Body mass index is 31.57 kg/m.  Physical Exam   General: Laying comfortably in bed; in no acute distress.  HENT: Normal oropharynx and mucosa. Normal external appearance of ears and nose.  Neck: Supple, no pain or tenderness  CV: No JVD. No peripheral edema.  Pulmonary: Symmetric Chest rise. Normal respiratory effort.  Abdomen: Soft to touch, non-tender.  Ext: No cyanosis, edema, or deformity  Skin: No rash. Normal palpation of skin.   Musculoskeletal: Normal digits and nails by inspection. No clubbing.   Neurologic  Examination  Mental status/Cognition: Alert, oriented to self, place, month and year, good attention.  Speech/language: Fluent, comprehension intact, object naming intact, repetition intact.  Cranial nerves:   CN II Pupils equal and reactive to light, R superior temporal quadrantanopsia.   CN III,IV,VI EOM intact, no gaze preference or deviation, no nystagmus    CN V normal sensation in V1, V2, and V3 segments bilaterally    CN VII no asymmetry, no nasolabial fold flattening    CN VIII normal hearing to speech    CN IX & X normal palatal elevation, no uvular deviation    CN XI 5/5 head turn and 5/5 shoulder shrug bilaterally    CN XII midline tongue protrusion    Motor:  Muscle bulk: poor, tone normal, pronator drift none tremor none Mvmt Root Nerve  Muscle Right Left Comments  SA C5/6 Ax Deltoid 4+ 4+   EF C5/6 Mc Biceps 5 5   EE C6/7/8 Rad Triceps 5 5   WF C6/7 Med FCR     WE C7/8 PIN ECU     F Ab C8/T1 U ADM/FDI 5 5   HF L1/2/3 Fem Illopsoas 4+ 4+   KE L2/3/4 Fem Quad 5 5   DF L4/5 D Peron Tib Ant 5 5   PF S1/2 Tibial Grc/Sol 5 5    Reflexes:  Right Left Comments  Pectoralis      Biceps (C5/6) 2 2   Brachioradialis (C5/6) 2 2    Triceps (C6/7) 2 2    Patellar (L3/4) 2 2    Achilles (S1)      Hoffman      Plantar     Jaw jerk    Sensation:  Light touch Intact throughout   Pin prick    Temperature    Vibration   Proprioception    Coordination/Complex Motor:  - Finger to Nose  intact BL - Heel to shin intact BL - Rapid alternating movement are normal - Gait: Stride length short. Arm swing poor. Base width narrow.  Labs   CBC:  Recent Labs  Lab 03/26/21 0717  WBC 10.4  HGB 17.0  HCT 49.9  MCV 88.9  PLT 160    Basic Metabolic Panel:  Lab Results  Component Value Date   NA 134 (L) 03/26/2021   K 3.8 03/26/2021   CO2 24 03/26/2021   GLUCOSE 152 (H) 03/26/2021   BUN 21 03/26/2021   CREATININE 1.35 (H) 03/26/2021   CALCIUM 9.0 03/26/2021   GFRNONAA  58 (L) 03/26/2021   GFRAA 72 08/05/2018   Lipid Panel:  Lab Results  Component Value Date   LDLCALC 56 12/14/2020   HgbA1c: No results found for: HGBA1C Urine Drug Screen: No results found for: LABOPIA, COCAINSCRNUR, LABBENZ, AMPHETMU, THCU, LABBARB  Alcohol Level No results found for: ETH  CT Head without contrast: Personally reviewed and notable for a chronic left occipital temporal PCA territory infarct but no acute intracranial abnormality otherwise.  MRI Brain: Unable to obtain 2/2 PPM(discussed with radiology techs). Also I am not particularly worried about a central etiology.  Impression   Joseph Carson is a 65 y.o. male with PMH significant for chronic atrial fibrillation on warfarin and recently amiodarone, CAD s/p CABG x3, coronary artery disease, combined systolic and diastolic heart failure s/p AICD placement, and hyperlipidemia who presented to the ED for evaluation of intermittent bilateral lower extremity weakness. His neurologic examination is notable for shoulder abduction and BL hip flexion weakness. Reports symptoms were noted a day after flu like symptoms. Also recently started on amiodarone and has a very sedentary lifestyles devoid of essentially any exercise.  I suspect that his episodic weakness is likely multifactorial. Amiodarone is a CP4503A inhibitor and this combined with a statin can cause myopathy. He also has a sedentary lifestyles and proximal leg muscles are most engaged when walking. Disuse of muscles will lead to atrophy and weakness. I think "flu like symptoms" since Wednesday probably unmasked the underlying weakness. Would recommend holding off on Amiodarone and statin, will get serum CK levels, will get nutritional labs along with treating hypothyroidism and have him see an outpatient neurologist to see how he is doing. If this is unrevealing, will need a potential EMG and further evaluation for potential neuromuscular  disorders.  Impression: BL lower extremity weakness Statin and amiodarone induced myopathy  Recommendations  - Serum CK, Vit B6, B12, MMA, folate. - STOP Atorvastatin and Amiodarone. - Follow up with PCP for hypothyroidism. (?Amiodarone induced thyroiditis) - Multivitamin with multimineral tablet PO daily. - Recommend outpatient PT and OT. - Follow up with neurologist outpatient for further evaluation and monitoring of BL lower extremities weakness and for the noted chronic L PCA stroke on warfarin. ______________________________________________________________________   Update: Discussed with ED team and they had reached out to EP team earlier about potential concern for amiodarone toxicity and EP recommended that patient stay on Amiodarone due to history of Vfibb/vtach. Should be okay to continue amiodarone until seen by EP next week.  Thank you for the opportunity to take part in the care of this patient. If you have any further questions, please contact the neurology consultation attending.  Signed,  Erick Blinks [facility] Pager Number [phone] _ _ _   _ __   _ __ _ _  __ __   _ __   __ _

## 2021-03-26 NOTE — ED Provider Notes (Signed)
Briefly: Patient with several episodes of intermittent bil lower extremity weakness and numbness, no back pain. Associated fall Thursday, negative imaging. Hx afib on amiodarone, EP requests that he remain on this, appt with them next week.  Plan: awaiting neuro consult to determine dispo   Neuro Dr. Derry Lory  has seen patient, concern for vitamin deficiency given recent deconditioning exacerbated by recent illness. Recommends that he have vitamin levels drawn this evening in the ER, start multivitamin OTC and follow-up with neuro outpatient.  He also request that patient stop his atorvastatin as his symptoms could be related to toxicity from this medication, he has been educated to do so.  Additionally, neuro did have the recommendation of stopping the amiodarone that he is taking as it could be toxicity related to this.  However, due to electrophysiology's strong recommendation of remaining on this medication as he has a history of V. tach and V. fib, we have agreed to continue with this medication at least until he can see electrophysiology in the office next week.   Patient amenable with plans of discharge, educated on plan and red flag symptoms that would prompt return. Patient discharged in stable condition.  Findings and plan of care discussed with supervising physician Dr. Deretha Emory who is in agreement.        Vear Clock 03/26/21 2111    Vanetta Mulders, MD 04/06/21 [phone]

## 2021-03-27 ENCOUNTER — Encounter: Payer: Self-pay | Admitting: Physician Assistant

## 2021-03-27 ENCOUNTER — Other Ambulatory Visit: Payer: Self-pay | Admitting: Physician Assistant

## 2021-03-27 ENCOUNTER — Telehealth: Payer: Self-pay | Admitting: Physician Assistant

## 2021-03-27 DIAGNOSIS — Z79899 Other long term (current) drug therapy: Secondary | ICD-10-CM

## 2021-03-27 DIAGNOSIS — E039 Hypothyroidism, unspecified: Secondary | ICD-10-CM

## 2021-03-27 NOTE — Telephone Encounter (Signed)
Alled patient to follow up on ER visit/labs Spoke to the patient and his daughter. Reviewed with Dr.Taylor his c/o LE weakness, numbness, neurology recommendations Amiodarone started July for PMVT/VF   Dr. Ladona Ridgel gave OK to stop amiodarone  I noted after d/w Dr. Ladona Ridgel his TSH also quite elevated, probably the amiodarone Will have him come for a free T3,T4 And see him next week.  The patient and daughter were appreciative of the follow up and stated understanding of the directions WI will ask our scheduler to call him for lab appointment  Francis Dowse, PA-C

## 2021-03-27 NOTE — Telephone Encounter (Signed)
Pt has follow up scheduled

## 2021-03-29 LAB — VITAMIN B6: Vitamin B6: 8.2 ug/L (ref 3.4–65.2)

## 2021-03-30 ENCOUNTER — Other Ambulatory Visit: Payer: Self-pay

## 2021-03-30 ENCOUNTER — Encounter: Payer: Self-pay | Admitting: *Deleted

## 2021-03-30 ENCOUNTER — Other Ambulatory Visit: Payer: Medicare Other | Admitting: *Deleted

## 2021-03-30 DIAGNOSIS — Z79899 Other long term (current) drug therapy: Secondary | ICD-10-CM

## 2021-03-30 DIAGNOSIS — E039 Hypothyroidism, unspecified: Secondary | ICD-10-CM

## 2021-03-30 LAB — HEPATIC FUNCTION PANEL
ALT: 11 IU/L (ref 0–44)
AST: 19 IU/L (ref 0–40)
Albumin: 4.1 g/dL (ref 3.8–4.8)
Alkaline Phosphatase: 78 IU/L (ref 44–121)
Bilirubin Total: 0.6 mg/dL (ref 0.0–1.2)
Bilirubin, Direct: 0.19 mg/dL (ref 0.00–0.40)
Total Protein: 7.2 g/dL (ref 6.0–8.5)

## 2021-03-30 LAB — T4, FREE: Free T4: 0.79 ng/dL — ABNORMAL LOW (ref 0.82–1.77)

## 2021-03-30 LAB — T3, FREE: T3, Free: 2.6 pg/mL (ref 2.0–4.4)

## 2021-03-30 LAB — METHYLMALONIC ACID, SERUM: Methylmalonic Acid, Quantitative: 160 nmol/L (ref 0–378)

## 2021-03-30 NOTE — Progress Notes (Unsigned)

## 2021-04-01 NOTE — Progress Notes (Signed)
Cardiology Office Note Date:  04/03/2021  Patient ID:  Joseph Carson, Joseph Carson 1956/05/09, MRN [id number] PCP:  [name], DO  Electrophysiologist: Dr. Lovena Le     Chief Complaint:  myalgias, amiodarone  History of Present Illness: [name] is a 65 y.o. male with history of VT, ICD, AFib, CAD (CABG 2014), chronic CHF (combined), HTN, HLD, LBBB, LV thrombus (2015).  He had a hospitalization 11/30/20 with multiple ICD shocks appropriate 2/2 VT/VF, recent/ongoing symptoms of cough/chills, feeling unwell. Admitted with VT/VF, COVID +, not felt to be volume OL Seen by EP, Dr. Quentin Ore, suspect COVID as one of the main drivers for recent functional decline and lead into his VT/VF, shocks. Tiksoyn was stopped and started amiodarone. Discharged 12/03/20  He had f/u with Dr. Lovena Le , mentioned noise on the A lead with plans to monitor, discussed appropriate therapies for VT, reminded of no driving, no changes were made  Device clinic note 03/15/21 for an alert 2/2 RV defib impedance,  >160 on 03/13/21.  lead impedence testing today reflected RV defib impedence of 96 ohms consistent with trends, no changes were made.  He had an ER visit 03/26/21 with progressive LE weakness, falls, neuroapthy sounding symptoms. In d/w myself recommended continue amioadrone, pending his neurology evaluation CK 166, B12 wnl Neuro saw him Discussed likely multifactorial with sedentary lifestyle and Recommended stopping lipitor and amiodarone If not improved further neuro eval recommended  After d/w dr. Lovena Le, cleared to stop amio and planned for EP follow up TSH quite high 30.704  03/30/21, T3 nml,T4 slightly low, LFTs were OK  TODAY He comes today accompanied by his daughter He feels like he is in fact feeling better, less SOB and legs starting to feel stronger as well. He report that shortly after starting amiodarone he started feeling a bit breathless, this has been about the same ,  his legs though fairly steadily in the last month or so have felt weak.  A couple weeks ago as he was getting out of bed noticed his legs felt strange/weak, but able to get his strength under him/ The day of his ER visit he had gone to the bathroom, his legs very weak and then fell He did not faint, was not lightheaded, his legs gave out They were so weak he was unable to get back up and drug himself back to his bedroom, finally able to get back in bed until the AM when his daught was awake she was able to help him and they went tot he ER.  Otherwise, he has felt well No CP, palpitations or cardiac awareness No SOB No bleeding or signs of bleeding   Device information MDT CRT-D implanted 10/14/2018 RA and CS leads are from  09/01/2014 Original RV lead extracted 2/2 noise, 10/14/2018  AAD Hx Tikosyn stopped >>  Amiodarone started  >> stopped Nov 2022 2/2 myalgias   Past Medical History:  Diagnosis Date   AICD (automatic cardioverter/defibrillator) present    Atrial fibrillation, chronic (HCC)    CAD (coronary artery disease)    a. s/p CABG x 3 03/2013 (L-LAD, S-RI, S-PDA) in [place]   CAP (community acquired pneumonia) 08/22/2015   CHF (congestive heart failure) ([place])    Chronic combined systolic and diastolic heart failure ([place]) November 2014   Coronary artery disease    Full dentures    HLD (hyperlipidemia)     Hyperbilirubinemia    Ischemic cardiomyopathy    a. echo (10/15):  EF 20%, diff HK,  apical AK, mobile density inf wall near papillary muscle (?ruptured) with mod MR, mod reduced RVSF, mild PI, PASP 37 mmHg b. s/p CRTD    LBBB (left bundle branch block)     LV (left ventricular) mural thrombus     a. TEE (10/15):  EF 15-20%, septum and ant walls AK, lat wall HK, no papillary muscle rupture, probable small thrombus on ant/ant-septal wall, mild MR, mod decreased RVSF, neg bubble study   Mitral regurgitation     mild by TEE 02/2014   NSVT (nonsustained ventricular tachycardia)          Obesity    Pulmonary hypertension (HCC)    Umbilical hernia    "have had it since age [age]"   Umbilical hernia    Wears glasses     Past Surgical History:  Procedure Laterality Date   BI-VENTRICULAR IMPLANTABLE CARDIOVERTER DEFIBRILLATOR N/A 09/01/2014   MDT CRTD implanted by Dr Ladona Ridgel   CARDIAC CATHETERIZATION  03/2013   CARDIOVERSION N/A 12/04/2017   Procedure: CARDIOVERSION;  Surgeon: Marinus Maw, MD;  Location: [facility] IN[facility]ASIVE [facility] LAB;  Service: Cardiovascular;  Laterality: N/A;   CORONARY ARTERY BYPASS GRAFT  03/2013   LIMA to the LAD, SVG to the ramus, SVG to the PDA   CORONARY ARTERY BYPASS GRAFT  2014   CABG "X3"   FRACTURE SURGERY     ICD LEAD REMOVAL N/A 08/14/2018   Procedure: ICD LEAD REMOVAL;  Surgeon: Marinus Maw, MD;  Location: [facility] OR;  Service: Cardiovascular;  Laterality: N/A;   INSERT / REPLACE / REMOVE PACEMAKER  2016   LEAD INSERTION N/A 08/14/2018   Procedure: LEAD INSERTION;  Surgeon: Marinus Maw, MD;  Location: [facility] OR;  Service: Cardiovascular;  Laterality: N/A;   LEAD REVISION/REPAIR N/A 12/04/2017   Procedure: LEAD REVISION/REPAIR;  Surgeon: Marinus Maw, MD;  Location: [facility] INVASIVE CV LAB;  Service: Cardiovascular;  Laterality: N/A;   MULTIPLE TOOTH EXTRACTIONS     PATELLA FRACTURE SURGERY Left 1972   TEE WITHOUT CARDIOVERSION N/A 03/04/2014   Procedure: TRANSESOPHAGEAL ECHOCARDIOGRAM (TEE);  Surgeon: Laurey Morale, MD;  Location[facility] ENDOSCOPY;  Service: Cardiovascular;  Laterality: N/A;   TEE WITHOUT CARDIOVERSION N/A 12/02/2017   Procedure: TRANSESOPHAGEAL ECHOCARDIOGRAM (TEE);  Surgeon: Jodelle Red, MD;  Location: [facility] ENDOSCOPY;  Service: Cardiovascular;  Laterality: N/A;    Current Outpatient Medications  Medication Sig Dispense Refill   acetaminophen (TYLENOL) 325 MG tablet Take 1-2 tablets (325-650 mg total) by mouth every 4 (four) hours as needed for mild pain.     carvedilol (COREG) 6.25 MG tablet TAKE 2 TABLETS WITH FOOD  TWICE DAILY 360 tablet 3   furosemide (LASIX) 40 MG tablet TAKE 1 TABLET BY MOUTH DAILY 90 tablet 3   Multiple Vitamins-Minerals (HAIR/SKIN/NAILS PO) Take 1 tablet by mouth daily.     spironolactone (ALDACTONE) 25 MG tablet TAKE 1 TABLET BY MOUTH DAILY. 90 tablet 3   warfarin (COUMADIN) 5 MG tablet TAKE 1/2 TO 1 TABLET BY MOUTH DAILY AS DIRECTED BY COUMADIN CLINIC 90 tablet 1   No current facility-administered medications for this visit.    Allergies:   Patient has no known allergies.   Social History:  The patient  reports that he has been smoking cigarettes. He has a 11.75 pack-year smoking history. He has never used smokeless tobacco. He reports that he does not currently use alcohol. He reports current drug use. Drug: Marijuana.   Family History:  The patient's family history includes Diabetes in  his mother; Thyroid disease in his mother.  ROS:  Please see the history of present illness.    All other systems are reviewed and otherwise negative.   PHYSICAL EXAM:  VS:  BP (!) 100/58   Pulse 60   Ht 5\' 10"  (1.778 m)   Wt 210 lb 9.6 oz (95.5 kg)   SpO2 97%   BMI 30.22 kg/m  BMI: Body mass index is 30.22 kg/m. Well nourished, well developed, in no acute distress HEENT: normocephalic, atraumatic Neck: no JVD, carotid bruits or masses Cardiac:  RRR; no significant murmurs, no rubs, or gallops Lungs:  CTA b/l, no wheezing, rhonchi or rales Abd: soft, nontender MS: no deformity or atrophy Ext: no edema Skin: warm and dry, no rash Neuro:  No gross deficits appreciated Psych: euthymic mood, full affect  ICD site is stable, no tethering or discomfort   EKG:  not done today  Device interrogation done today and reviewed by myself:  Battery and lead measurements are good No arrhythmias No programming changes BP 99.3% Effective 82.9%   12/02/2020: TTE IMPRESSIONS   1. Left ventricular ejection fraction, by estimation, is <20%. The left  ventricle has severely decreased  function. The left ventricle demonstrates  global hypokinesis. The left ventricular internal cavity size was severely  dilated. Left ventricular  diastolic parameters are consistent with Grade I diastolic dysfunction  (impaired relaxation).   2. Right ventricular systolic function is normal. The right ventricular  size is normal. There is normal pulmonary artery systolic pressure. The  estimated right ventricular systolic pressure is [id number] mmHg.   3. Left atrial size was moderately dilated.   4. Right atrial size was mildly dilated.   5. The mitral valve is normal in structure. Mild mitral valve  regurgitation. No evidence of mitral stenosis.   6. The aortic valve is normal in structure. Aortic valve regurgitation is  not visualized. No aortic stenosis is present.   7. The inferior vena cava is normal in size with greater than 50%  respiratory variability, suggesting right atrial pressure of 3 mmHg.    Recent Labs: 03/26/2021: BUN 21; Creatinine, Ser 1.35; Hemoglobin 17.0; Magnesium 1.9; Platelets 160; Potassium 3.8; Sodium 134; TSH 30.704 03/30/2021: ALT 11  12/14/2020: Chol/HDL Ratio 2.9; Cholesterol, Total 114; HDL 40; LDL Chol Calc (NIH) 56; Triglycerides 94   Estimated Creatinine Clearance: 63.3 mL/min (A) (by C-G formula based on SCr of 1.35 mg/dL (H)).   Wt Readings from Last 3 Encounters:  04/03/21 210 lb 9.6 oz (95.5 kg)  03/26/21 220 lb (99.8 kg)  01/02/21 202 lb (91.6 kg)     Other studies reviewed: Additional studies/records reviewed today include: summarized above  ASSESSMENT AND PLAN:  ICD Intact function No programming changes made  VT Admitted July 2022 with multiple appropriate shocks, started on amiodarone Amio stopped Nov 2022 2/2 myalgias (lipitor also stopped) TSH up markedly, T2,T4 ok Myalgias starting to improve  CAD No anginal symptoms No ASA w/warfarin Off statin w/myalgias  Chronic CHF (systolic) ICM No symptoms or exam findings of volume  OL Doubt BP will allow an ARB  Paroxysmal Afib CHA2DS2Vasc is 6, on warfarin 0 % burden Off tiksoyn July 2022 in setting of Vt/VF Off amiodarone Nov 2022 2/2 myalgias   HTN Relative hypotension     Disposition: F/u with remotes as usual, Dr. Lovena Le in a couple months, plan f/u TSH then, suspect we will need to re-visit alternative AAD at that time as well.  Current medicines are reviewed  at length with the patient today.  The patient did not have any concerns regarding medicines.  Venetia Night, PA-C 04/03/2021 4:52 PM     [address] [place] [id number] [phone] (office)  [phone] (fax)

## 2021-04-02 ENCOUNTER — Encounter: Payer: Self-pay | Admitting: Internal Medicine

## 2021-04-02 NOTE — Progress Notes (Signed)
Things That May Be Affecting Your Health:  Alcohol  Hearing loss  Pain    Depression  Home Safety  Sexual Health   Diabetes x Lack of physical activity  Stress   Difficulty with daily activities  Loneliness  Tiredness   Drug use x Medicines  Tobacco use   Falls  Motor Vehicle Safety  Weight   Food choices  Oral Health  Other    YOUR PERSONALIZED HEALTH PLAN : 1. Schedule your next subsequent Medicare Wellness visit in one year 2. Attend all of your regular appointments to address your medical issues 3. Complete the preventative screenings and services   Annual Wellness Visit   Medicare Covered Preventative Screenings and Services  Services & Screenings Men and Women Who How Often Need? Date of Last Service Action  Abdominal Aortic Aneurysm Adults with AAA risk factors Once      Alcohol Misuse and Counseling All Adults Screening once a year if no alcohol misuse. Counseling up to 4 face to face sessions.     Bone Density Measurement  Adults at risk for osteoporosis Once every 2 yrs      Lipid Panel Z13.6 All adults without CV disease Once every 5 yrs       Colorectal Cancer  Stool sample or Colonoscopy All adults 50 and older  Once every year Every 10 years x       Depression All Adults Once a year  Today   Diabetes Screening Blood glucose, post glucose load, or GTT Z13.1 All adults at risk Pre-diabetics Once per year Twice per year      Diabetes  Self-Management Training All adults Diabetics 10 hrs first year; 2 hours subsequent years. Requires Copay     Glaucoma Diabetics Family history of glaucoma African Americans 50 yrs + Hispanic Americans 65 yrs + Annually - requires coppay      Hepatitis C Z72.89 or F19.20 High Risk for HCV Born between 1945 and 1965 Annually Once x     HIV Z11.4 All adults based on risk Annually btw ages 60 & 54 regardless of risk Annually > 65 yrs if at increased risk      Lung Cancer Screening Asymptomatic adults aged 40-77 with 30  pack yr history and current smoker OR quit within the last 15 yrs Annually Must have counseling and shared decision making documentation before first screen      Medical Nutrition Therapy Adults with  Diabetes Renal disease Kidney transplant within past 3 yrs 3 hours first year; 2 hours subsequent years     Obesity and Counseling All adults Screening once a year Counseling if BMI 30 or higher  Today   Tobacco Use Counseling Adults who use tobacco  Up to 8 visits in one year     Vaccines Z23 Hepatitis B Influenza  Pneumonia  Adults  Once Once every flu season Two different vaccines separated by one year x Flu vaccine   Next Annual Wellness Visit People with Medicare Every year  Today     Services & Screenings Women Who How Often Need  Date of Last Service Action  Mammogram  Z12.31 Women over 40 One baseline ages 11-39. Annually ager 40 yrs+      Pap tests All women Annually if high risk. Every 2 yrs for normal risk women      Screening for cervical cancer with  Pap (Z01.419 nl or Z01.411abnl) & HPV Z11.51 Women aged 20 to 66 Once every 5 yrs  Screening pelvic and breast exams All women Annually if high risk. Every 2 yrs for normal risk women     Sexually Transmitted Diseases Chlamydia Gonorrhea Syphilis All at risk adults Annually for non pregnant females at increased risk         Services & Screenings Men Who How Ofter Need  Date of Last Service Action  Prostate Cancer - DRE & PSA Men over 50 Annually.  DRE might require a copay.        Sexually Transmitted Diseases Syphilis All at risk adults Annually for men at increased risk      Health Maintenance List Health Maintenance  Topic Date Due   COVID-19 Vaccine (1) Never done   Pneumonia Vaccine 21+ Years old (1 - PCV) Never done   Hepatitis C Screening  Never done   TETANUS/TDAP  Never done   Zoster Vaccines- Shingrix (1 of 2) Never done   COLONOSCOPY (Pts 45-22yrs Insurance coverage will need to be  confirmed)  Never done   COLON CANCER SCREENING ANNUAL FOBT  10/17/2016   INFLUENZA VACCINE  Never done   HIV Screening  Completed   HPV VACCINES  Aged Out

## 2021-04-03 ENCOUNTER — Ambulatory Visit (INDEPENDENT_AMBULATORY_CARE_PROVIDER_SITE_OTHER): Payer: Medicare Other | Admitting: Physician Assistant

## 2021-04-03 ENCOUNTER — Encounter: Payer: Self-pay | Admitting: Physician Assistant

## 2021-04-03 ENCOUNTER — Other Ambulatory Visit: Payer: Self-pay

## 2021-04-03 VITALS — BP 100/58 | HR 60 | Ht 70.0 in | Wt 210.6 lb

## 2021-04-03 DIAGNOSIS — I255 Ischemic cardiomyopathy: Secondary | ICD-10-CM | POA: Diagnosis not present

## 2021-04-03 DIAGNOSIS — I472 Ventricular tachycardia, unspecified: Secondary | ICD-10-CM

## 2021-04-03 DIAGNOSIS — I2510 Atherosclerotic heart disease of native coronary artery without angina pectoris: Secondary | ICD-10-CM

## 2021-04-03 DIAGNOSIS — I480 Paroxysmal atrial fibrillation: Secondary | ICD-10-CM | POA: Diagnosis not present

## 2021-04-03 DIAGNOSIS — Z79899 Other long term (current) drug therapy: Secondary | ICD-10-CM | POA: Diagnosis not present

## 2021-04-03 DIAGNOSIS — Z95810 Presence of automatic (implantable) cardiac defibrillator: Secondary | ICD-10-CM

## 2021-04-03 DIAGNOSIS — I5022 Chronic systolic (congestive) heart failure: Secondary | ICD-10-CM

## 2021-04-03 LAB — CUP PACEART INCLINIC DEVICE CHECK
Battery Remaining Longevity: 32 mo
Battery Voltage: 2.95 V
Brady Statistic AP VP Percent: 84.91 %
Brady Statistic AP VS Percent: 0.12 %
Brady Statistic AS VP Percent: 14.66 %
Brady Statistic AS VS Percent: 0.32 %
Brady Statistic RA Percent Paced: 84.46 %
Brady Statistic RV Percent Paced: 99.26 %
Date Time Interrogation Session: 20221115170335
HighPow Impedance: 102 Ohm
Implantable Lead Implant Date: 20160414
Implantable Lead Implant Date: 20160414
Implantable Lead Implant Date: 20200327
Implantable Lead Location: 753858
Implantable Lead Location: 753859
Implantable Lead Location: 753860
Implantable Lead Model: 4398
Implantable Lead Model: 5076
Implantable Lead Model: 6935
Implantable Pulse Generator Implant Date: 20200327
Lead Channel Impedance Value: 230.327
Lead Channel Impedance Value: 230.327
Lead Channel Impedance Value: 230.327
Lead Channel Impedance Value: 256.5 Ohm
Lead Channel Impedance Value: 256.5 Ohm
Lead Channel Impedance Value: 399 Ohm
Lead Channel Impedance Value: 418 Ohm
Lead Channel Impedance Value: 475 Ohm
Lead Channel Impedance Value: 513 Ohm
Lead Channel Impedance Value: 513 Ohm
Lead Channel Impedance Value: 513 Ohm
Lead Channel Impedance Value: 513 Ohm
Lead Channel Impedance Value: 551 Ohm
Lead Channel Impedance Value: 779 Ohm
Lead Channel Impedance Value: 779 Ohm
Lead Channel Impedance Value: 798 Ohm
Lead Channel Impedance Value: 855 Ohm
Lead Channel Impedance Value: 874 Ohm
Lead Channel Pacing Threshold Amplitude: 0.625 V
Lead Channel Pacing Threshold Amplitude: 1 V
Lead Channel Pacing Threshold Amplitude: 2.75 V
Lead Channel Pacing Threshold Pulse Width: 0.4 ms
Lead Channel Pacing Threshold Pulse Width: 0.4 ms
Lead Channel Pacing Threshold Pulse Width: 0.8 ms
Lead Channel Sensing Intrinsic Amplitude: 1.125 mV
Lead Channel Sensing Intrinsic Amplitude: 1.25 mV
Lead Channel Sensing Intrinsic Amplitude: 15.875 mV
Lead Channel Sensing Intrinsic Amplitude: 20.875 mV
Lead Channel Setting Pacing Amplitude: 1.5 V
Lead Channel Setting Pacing Amplitude: 2.5 V
Lead Channel Setting Pacing Amplitude: 2.5 V
Lead Channel Setting Pacing Pulse Width: 0.4 ms
Lead Channel Setting Pacing Pulse Width: 0.8 ms
Lead Channel Setting Sensing Sensitivity: 0.3 mV

## 2021-04-03 NOTE — Patient Instructions (Addendum)
Medication Instructions:   Your physician recommends that you continue on your current medications as directed. Please refer to the Current Medication list given to you today.  *If you need a refill on your cardiac medications before your next appointment, please call your pharmacy*   Lab Work: TSH TODAY ( RETURN FOR LABS 6-8 WEEKS )  If you have labs (blood work) drawn today and your tests are completely normal, you will receive your results only by: MyChart Message (if you have MyChart) OR A paper copy in the mail If you have any lab test that is abnormal or we need to change your treatment, we will call you to review the results.   Testing/Procedures: NONE ORDERED  TODAY    Follow-Up: At [facility], you and your health needs are our priority.  As part of our continuing mission to provide you with exceptional heart care, we have created designated Provider Care Teams.  These Care Teams include your primary Cardiologist (physician) and Advanced Practice Providers (APPs -  Physician Assistants and Nurse Practitioners) who all work together to provide you with the care you need, when you need it.  We recommend signing up for the patient portal called "MyChart".  Sign up information is provided on this After Visit Summary.  MyChart is used to connect with patients for Virtual Visits (Telemedicine).  Patients are able to view lab/test results, encounter notes, upcoming appointments, etc.  Non-urgent messages can be sent to your provider as well.   To learn more about what you can do with MyChart, go to [email or web address].    Your next appointment:   6 -8 week(s)  The format for your next appointment:   In Person  Provider:   Lewayne Bunting, MD{   Other Instructions

## 2021-05-01 ENCOUNTER — Other Ambulatory Visit: Payer: Self-pay

## 2021-05-01 ENCOUNTER — Ambulatory Visit (INDEPENDENT_AMBULATORY_CARE_PROVIDER_SITE_OTHER): Payer: Medicare Other

## 2021-05-01 DIAGNOSIS — Z5181 Encounter for therapeutic drug level monitoring: Secondary | ICD-10-CM

## 2021-05-01 DIAGNOSIS — I513 Intracardiac thrombosis, not elsewhere classified: Secondary | ICD-10-CM | POA: Diagnosis not present

## 2021-05-01 LAB — POCT INR: INR: 1.9 — AB (ref 2.0–3.0)

## 2021-05-01 NOTE — Patient Instructions (Signed)
TAKE 1 tablet tonight only and then continue 0.5 tablet daily except 1 tablet on Wednesday.  Recheck INR in 2 weeks.  Call Coumadin Clinic if placed on any new medications or scheduled for any procedures - [phone];

## 2021-05-15 ENCOUNTER — Other Ambulatory Visit: Payer: Self-pay

## 2021-05-15 ENCOUNTER — Ambulatory Visit (INDEPENDENT_AMBULATORY_CARE_PROVIDER_SITE_OTHER): Payer: Medicare Other

## 2021-05-15 DIAGNOSIS — Z5181 Encounter for therapeutic drug level monitoring: Secondary | ICD-10-CM

## 2021-05-15 DIAGNOSIS — I513 Intracardiac thrombosis, not elsewhere classified: Secondary | ICD-10-CM | POA: Diagnosis not present

## 2021-05-15 LAB — POCT INR: INR: 1.4 — AB (ref 2.0–3.0)

## 2021-05-15 NOTE — Patient Instructions (Signed)
Description   TAKE 1 tablet tonight only and START taking  0.5 tablet daily except 1 tablet on Monday, Wednesday, and Fridays.  Recheck INR in 1 week.  Call Coumadin Clinic if placed on any new medications or scheduled for any procedures - [phone];

## 2021-05-22 ENCOUNTER — Ambulatory Visit (INDEPENDENT_AMBULATORY_CARE_PROVIDER_SITE_OTHER): Payer: Medicare Other

## 2021-05-22 ENCOUNTER — Other Ambulatory Visit: Payer: Self-pay

## 2021-05-22 DIAGNOSIS — Z5181 Encounter for therapeutic drug level monitoring: Secondary | ICD-10-CM

## 2021-05-22 DIAGNOSIS — I513 Intracardiac thrombosis, not elsewhere classified: Secondary | ICD-10-CM | POA: Diagnosis not present

## 2021-05-22 LAB — POCT INR: INR: 1.8 — AB (ref 2.0–3.0)

## 2021-05-22 NOTE — Patient Instructions (Signed)
Description   TAKE 1 tablet tonight only and START taking  0.5 tablet daily except 1 tablet on Sunday, Monday, Wednesday, and Fridays.  Recheck INR in 1 week.  Call Coumadin Clinic if placed on any new medications or scheduled for any procedures - [phone];

## 2021-05-27 ENCOUNTER — Telehealth: Payer: Self-pay | Admitting: Physician Assistant

## 2021-05-27 NOTE — Telephone Encounter (Signed)
Pt called stating his device is beeping, has had three different alarms starting at 8AM today. He reports no shocks, no palpitations, tachycardia, or syncope. I advised to send a manual download and to call the medtronic rep. He states the last time this happened, some changes were made to his device over the phone.   I will send a message to Dr. Ladona Ridgel. He has an appt on Tues.

## 2021-05-28 ENCOUNTER — Telehealth: Payer: Self-pay | Admitting: Internal Medicine

## 2021-05-28 ENCOUNTER — Telehealth: Payer: Self-pay

## 2021-05-28 NOTE — Telephone Encounter (Signed)
Duplicate;   See 05/27/21 phone encounter

## 2021-05-28 NOTE — Telephone Encounter (Signed)
The patient left a message having questions about his device.   I called the patient back. I left a message with the device clinic number.

## 2021-05-28 NOTE — Telephone Encounter (Signed)
°  1. Has your device fired? No   2. Is you device beeping? Yes, beeps about every 4 hours  3. Are you experiencing draining or swelling at device site? no  4. Are you calling to see if we received your device transmission? no  5. Have you passed out? no    Please route to Device Clinic Pool

## 2021-05-28 NOTE — Telephone Encounter (Signed)
Spoke with patient.  Advised alarm is just as he previously had for RV lead defib impedence.  At last visit Dr. Ladona Ridgel indicated that we will watch closely.  If alarm resounds then RV lead may need attention.    Patient is scheduled for follow-up with Dr. Ladona Ridgel tomorrow.  Advised I can bring him in today to reset the alarm, howver no guarantee that it wound re-trigger.  He will wait until appt with Dr. Ladona Ridgel tomorrow.  Pt currently;y feels fine, denies any cardiac symptoms, pt v/u if that should change he should seek attention for his symptoms.

## 2021-05-29 ENCOUNTER — Encounter: Payer: Self-pay | Admitting: Internal Medicine

## 2021-05-29 ENCOUNTER — Other Ambulatory Visit: Payer: Medicare Other | Admitting: *Deleted

## 2021-05-29 ENCOUNTER — Ambulatory Visit (INDEPENDENT_AMBULATORY_CARE_PROVIDER_SITE_OTHER): Payer: Medicare Other | Admitting: Internal Medicine

## 2021-05-29 ENCOUNTER — Other Ambulatory Visit: Payer: Self-pay

## 2021-05-29 VITALS — BP 112/60 | HR 63 | Ht 70.0 in | Wt 216.0 lb

## 2021-05-29 DIAGNOSIS — I2510 Atherosclerotic heart disease of native coronary artery without angina pectoris: Secondary | ICD-10-CM

## 2021-05-29 DIAGNOSIS — I5022 Chronic systolic (congestive) heart failure: Secondary | ICD-10-CM

## 2021-05-29 DIAGNOSIS — Z79899 Other long term (current) drug therapy: Secondary | ICD-10-CM

## 2021-05-29 DIAGNOSIS — I4819 Other persistent atrial fibrillation: Secondary | ICD-10-CM

## 2021-05-29 DIAGNOSIS — Z95810 Presence of automatic (implantable) cardiac defibrillator: Secondary | ICD-10-CM | POA: Diagnosis not present

## 2021-05-29 NOTE — Progress Notes (Signed)
HPI Joseph Carson returns today for followup. He is a pleasant 66 yo man with a  H/o VT, PAF, CAD, and ICD lead dysfunction s/p extraction and insertion of a new system due to noise on the ICD lead. In the interim he notes that he has developed pain in his legs and his amio and statin were stopped and he feels much better. He denies chest pain or sob. No Known Allergies   Current Outpatient Medications  Medication Sig Dispense Refill   acetaminophen (TYLENOL) 325 MG tablet Take 1-2 tablets (325-650 mg total) by mouth every 4 (four) hours as needed for mild pain.     carvedilol (COREG) 6.25 MG tablet TAKE 2 TABLETS WITH FOOD TWICE DAILY 360 tablet 3   furosemide (LASIX) 40 MG tablet TAKE 1 TABLET BY MOUTH DAILY 90 tablet 3   Multiple Vitamins-Minerals (HAIR/SKIN/NAILS PO) Take 1 tablet by mouth daily.     spironolactone (ALDACTONE) 25 MG tablet TAKE 1 TABLET BY MOUTH DAILY. 90 tablet 3   warfarin (COUMADIN) 5 MG tablet TAKE 1/2 TO 1 TABLET BY MOUTH DAILY AS DIRECTED BY COUMADIN CLINIC 90 tablet 1   No current facility-administered medications for this visit.     Past Medical History:  Diagnosis Date   AICD (automatic cardioverter/defibrillator) present    Atrial fibrillation, chronic (HCC)    CAD (coronary artery disease)    a. s/p CABG x 3 03/2013 (L-LAD, S-RI, S-PDA) in [place]   CAP (community acquired pneumonia) 08/22/2015   CHF (congestive heart failure) (HCC)    Chronic combined systolic and diastolic heart failure ([place]) November 2014   Coronary artery disease    Full dentures    HLD (hyperlipidemia)     Hyperbilirubinemia    Ischemic cardiomyopathy    a. echo (10/15):  EF 20%, diff HK, apical AK, mobile density inf wall near papillary muscle (?ruptured) with mod MR, mod reduced RVSF, mild PI, PASP 37 mmHg b. s/p CRTD    LBBB (left bundle branch block)     LV (left ventricular) mural thrombus     a. TEE (10/15):  EF 15-20%, septum and ant walls AK, lat wall HK, no papillary  muscle rupture, probable small thrombus on ant/ant-septal wall, mild MR, mod decreased RVSF, neg bubble study   Mitral regurgitation     mild by TEE 02/2014   NSVT (nonsustained ventricular tachycardia)         Obesity    Pulmonary hypertension ([place])    Umbilical hernia    "have had it since age [id number]"   Umbilical hernia    Wears glasses     ROS:   All systems reviewed and negative except as noted in the HPI.   Past Surgical History:  Procedure Laterality Date   BI-VENTRICULAR IMPLANTABLE CARDIOVERTER DEFIBRILLATOR N/A 09/01/2014   MDT CRTD implanted by Dr Lovena Le   CARDIAC CATHETERIZATION  03/2013   CARDIOVERSION N/A 12/04/2017   Procedure: CARDIOVERSION;  Surgeon: Evans Lance, MD;  Location: [facility];  Service: Cardiovascular;  Laterality: N/A;   CORONARY ARTERY BYPASS GRAFT  03/2013   LIMA to the LAD, SVG to the ramus, SVG to the PDA   CORONARY ARTERY BYPASS GRAFT  2014   CABG "X3"   FRACTURE SURGERY     ICD LEAD REMOVAL N/A 08/14/2018   Procedure: ICD LEAD REMOVAL;  Surgeon: Evans Lance, MD;  Location: [facility];  Service: Cardiovascular;  Laterality: N/A;   INSERT / REPLACE /  REMOVE PACEMAKER  2016   LEAD INSERTION N/A 08/14/2018   Procedure: LEAD INSERTION;  Surgeon: Evans Lance, MD;  Location: [facility];  Service: Cardiovascular;  Laterality: N/A;   LEAD REVISION/REPAIR N/A 12/04/2017   Procedure: LEAD REVISION/REPAIR;  Surgeon: Evans Lance, MD;  Location: [facility];  Service: Cardiovascular;  Laterality: N/A;   MULTIPLE TOOTH EXTRACTIONS     PATELLA FRACTURE SURGERY Left 1972   TEE WITHOUT CARDIOVERSION N/A 03/04/2014   Procedure: TRANSESOPHAGEAL ECHOCARDIOGRAM (TEE);  Surgeon: Larey Dresser, MD;  Location: [facility];  Service: Cardiovascular;  Laterality: N/A;   TEE WITHOUT CARDIOVERSION N/A 12/02/2017   Procedure: TRANSESOPHAGEAL ECHOCARDIOGRAM (TEE);  Surgeon: Buford Dresser, MD;  Location: [facility];  Service: Cardiovascular;   Laterality: N/A;     Family History  Problem Relation Age of Onset   Diabetes Mother        Deceased   Thyroid disease Mother      Social History   Socioeconomic History   Marital status: Divorced    Spouse name: Not on file   Number of children: Not on file   Years of education: Not on file   Highest education level: Not on file  Occupational History   Not on file  Tobacco Use   Smoking status: Every Day    Packs/day: 0.25    Years: 47.00    Pack years: 11.75    Types: Cigarettes   Smokeless tobacco: Never   Tobacco comments:    3-4 per day   Vaping Use   Vaping Use: Never used  Substance and Sexual Activity   Alcohol use: Not Currently    Alcohol/week: 0.0 standard drinks    Comment: "quit drinking when I was 23"   Drug use: Yes    Types: Marijuana    Comment:  "weekly" last use week of 08/07/2018   Sexual activity: Not Currently  Other Topics Concern   Not on file  Social History Narrative   ** Merged History Encounter **       Social Determinants of Health   Financial Resource Strain: Not on file  Food Insecurity: Not on file  Transportation Needs: Not on file  Physical Activity: Not on file  Stress: Not on file  Social Connections: Not on file  Intimate Partner Violence: Not on file     BP 112/60    Pulse 63    Ht 5\' 10"  (1.778 m)    Wt 216 lb (98 kg)    SpO2 98%    BMI 30.99 kg/m   Physical Exam:  Well appearing NAD HEENT: Unremarkable Neck:  No JVD, no thyromegally Lymphatics:  No adenopathy Back:  No CVA tenderness Lungs:  Clear with no wheezes HEART:  Regular rate rhythm, no murmurs, no rubs, no clicks Abd:  soft, positive bowel sounds, no organomegally, no rebound, no guarding Ext:  2 plus pulses, no edema, no cyanosis, no clubbing Skin:  No rashes no nodules Neuro:  CN II through XII intact, motor grossly intact  EKG - nsr with biv pacing  DEVICE  Normal device function.  See PaceArt for details.   Assess/Plan:  1. PAF - he  is maintaining NSR off of amiodarone. 2. ICD/VT - his medtronic Biv ICD is working normally. We note some noise on his atrial lead. We will follow. He has had a single spurious high voltage impedence and we will follow. Hopefully he will not need another ICD lead. 3. Chronic systolic heart failure -  his symptoms are class 2. He will continue his current meds. 4. CAD - he denies anginal symptoms. We will follow.    Carleene Overlie Tara Wich,MD

## 2021-05-29 NOTE — Patient Instructions (Addendum)
Medication Instructions:  Your physician recommends that you continue on your current medications as directed. Please refer to the Current Medication list given to you today.  Labwork: None ordered.  Testing/Procedures: None ordered.  Follow-Up: Your physician wants you to follow-up in: one year with Lewayne Bunting, MD    Remote monitoring is used to monitor your ICD from home. This monitoring reduces the number of office visits required to check your device to one time per year. It allows [place] to keep an eye on the functioning of your device to ensure it is working properly. You are scheduled for a device check from home on 08/30/2021. You may send your transmission at any time that day. If you have a wireless device, the transmission will be sent automatically. After your physician reviews your transmission, you will receive a postcard with your next transmission date.  Any Other Special Instructions Will Be Listed Below (If Applicable).  If you need a refill on your cardiac medications before your next appointment, please call your pharmacy.

## 2021-05-30 LAB — TSH: TSH: 33.5 u[IU]/mL — ABNORMAL HIGH (ref 0.450–4.500)

## 2021-06-01 ENCOUNTER — Telehealth: Payer: Self-pay | Admitting: *Deleted

## 2021-06-01 ENCOUNTER — Other Ambulatory Visit: Payer: Self-pay | Admitting: *Deleted

## 2021-06-01 ENCOUNTER — Ambulatory Visit: Payer: Medicare Other

## 2021-06-01 MED ORDER — LEVOTHYROXINE SODIUM 50 MCG PO TABS: 50.0000 ug | ORAL_TABLET | Freq: Every day | ORAL | 1 refills | Status: DC

## 2021-06-01 NOTE — Telephone Encounter (Signed)
-----   Message from [facility], [place] sent at 05/30/2021  6:37 AM EST ----- TSH remains high despite off amiodarone.   Will d/w Dr. Ladona Ridgel that saw him yesterday, please advise him to see his PMD, I included  PMD here as well for evaluation and management of his thyroid function

## 2021-06-01 NOTE — Telephone Encounter (Signed)
Spoke with patient aware of results and recommendations and verbalized understanding.

## 2021-06-03 ENCOUNTER — Other Ambulatory Visit: Payer: Self-pay

## 2021-06-03 ENCOUNTER — Encounter (HOSPITAL_COMMUNITY): Payer: Self-pay | Admitting: Emergency Medicine

## 2021-06-03 ENCOUNTER — Observation Stay (HOSPITAL_COMMUNITY)
Admission: EM | Admit: 2021-06-03 | Discharge: 2021-06-04 | Disposition: A | Discharge status: Home / Self Care | Payer: Medicare Other | Attending: Emergency Medicine | Admitting: Emergency Medicine

## 2021-06-03 ENCOUNTER — Telehealth: Payer: Self-pay | Admitting: Physician Assistant

## 2021-06-03 DIAGNOSIS — I4819 Other persistent atrial fibrillation: Secondary | ICD-10-CM | POA: Diagnosis not present

## 2021-06-03 DIAGNOSIS — Z20822 Contact with and (suspected) exposure to covid-19: Secondary | ICD-10-CM | POA: Diagnosis not present

## 2021-06-03 DIAGNOSIS — Z951 Presence of aortocoronary bypass graft: Secondary | ICD-10-CM | POA: Diagnosis not present

## 2021-06-03 DIAGNOSIS — F17210 Nicotine dependence, cigarettes, uncomplicated: Secondary | ICD-10-CM | POA: Insufficient documentation

## 2021-06-03 DIAGNOSIS — T82118A Breakdown (mechanical) of other cardiac electronic device, initial encounter: Principal | ICD-10-CM | POA: Insufficient documentation

## 2021-06-03 DIAGNOSIS — I509 Heart failure, unspecified: Secondary | ICD-10-CM | POA: Insufficient documentation

## 2021-06-03 DIAGNOSIS — I5022 Chronic systolic (congestive) heart failure: Secondary | ICD-10-CM

## 2021-06-03 DIAGNOSIS — R7989 Other specified abnormal findings of blood chemistry: Secondary | ICD-10-CM

## 2021-06-03 DIAGNOSIS — I480 Paroxysmal atrial fibrillation: Secondary | ICD-10-CM | POA: Diagnosis not present

## 2021-06-03 DIAGNOSIS — E039 Hypothyroidism, unspecified: Secondary | ICD-10-CM | POA: Diagnosis not present

## 2021-06-03 DIAGNOSIS — I472 Ventricular tachycardia, unspecified: Secondary | ICD-10-CM

## 2021-06-03 DIAGNOSIS — I10 Essential (primary) hypertension: Secondary | ICD-10-CM | POA: Insufficient documentation

## 2021-06-03 DIAGNOSIS — Z95810 Presence of automatic (implantable) cardiac defibrillator: Secondary | ICD-10-CM | POA: Diagnosis present

## 2021-06-03 DIAGNOSIS — Z7901 Long term (current) use of anticoagulants: Secondary | ICD-10-CM | POA: Diagnosis not present

## 2021-06-03 DIAGNOSIS — Z79899 Other long term (current) drug therapy: Secondary | ICD-10-CM | POA: Diagnosis not present

## 2021-06-03 DIAGNOSIS — I2510 Atherosclerotic heart disease of native coronary artery without angina pectoris: Secondary | ICD-10-CM | POA: Diagnosis not present

## 2021-06-03 DIAGNOSIS — T829XXA Unspecified complication of cardiac and vascular prosthetic device, implant and graft, initial encounter: Secondary | ICD-10-CM | POA: Diagnosis present

## 2021-06-03 DIAGNOSIS — T82198A Other mechanical complication of other cardiac electronic device, initial encounter: Secondary | ICD-10-CM

## 2021-06-03 LAB — CUP PACEART REMOTE DEVICE CHECK
Battery Remaining Longevity: 31 mo
Battery Remaining Longevity: 31 mo
Battery Voltage: 2.9 V
Battery Voltage: 2.9 V
Brady Statistic AP VP Percent: 82.69 %
Brady Statistic AP VP Percent: 93.26 %
Brady Statistic AP VS Percent: 0.17 %
Brady Statistic AP VS Percent: 0.16 %
Brady Statistic AS VP Percent: 16.39 %
Brady Statistic AS VP Percent: 6.26 %
Brady Statistic AS VS Percent: 0.76 %
Brady Statistic AS VS Percent: 0.32 %
Brady Statistic RA Percent Paced: 81.88 %
Brady Statistic RA Percent Paced: 92.99 %
Brady Statistic RV Percent Paced: 98.05 %
Brady Statistic RV Percent Paced: 99.16 %
Date Time Interrogation Session: 20230114210020
Date Time Interrogation Session: 20230115120631
HighPow Impedance: 217 Ohm
HighPow Impedance: 116 Ohm
Implantable Lead Implant Date: 20160414
Implantable Lead Implant Date: 20160414
Implantable Lead Implant Date: 20200327
Implantable Lead Implant Date: 20160414
Implantable Lead Implant Date: 20160414
Implantable Lead Implant Date: 20200327
Implantable Lead Location: 753858
Implantable Lead Location: 753859
Implantable Lead Location: 753860
Implantable Lead Location: 753858
Implantable Lead Location: 753859
Implantable Lead Location: 753860
Implantable Lead Model: 4398
Implantable Lead Model: 5076
Implantable Lead Model: 6935
Implantable Lead Model: 4398
Implantable Lead Model: 5076
Implantable Lead Model: 6935
Implantable Pulse Generator Implant Date: 20200327
Implantable Pulse Generator Implant Date: 20200327
Lead Channel Impedance Value: 208.578
Lead Channel Impedance Value: 208.578
Lead Channel Impedance Value: 221.02 Ohm
Lead Channel Impedance Value: 264.643
Lead Channel Impedance Value: 264.643
Lead Channel Impedance Value: 361 Ohm
Lead Channel Impedance Value: 475 Ohm
Lead Channel Impedance Value: 475 Ohm
Lead Channel Impedance Value: 494 Ohm
Lead Channel Impedance Value: 494 Ohm
Lead Channel Impedance Value: 494 Ohm
Lead Channel Impedance Value: 494 Ohm
Lead Channel Impedance Value: 570 Ohm
Lead Channel Impedance Value: 722 Ohm
Lead Channel Impedance Value: 722 Ohm
Lead Channel Impedance Value: 760 Ohm
Lead Channel Impedance Value: 798 Ohm
Lead Channel Impedance Value: 798 Ohm
Lead Channel Impedance Value: 216.848
Lead Channel Impedance Value: 216.848
Lead Channel Impedance Value: 261.639
Lead Channel Impedance Value: 292.308
Lead Channel Impedance Value: 292.308
Lead Channel Impedance Value: 399 Ohm
Lead Channel Impedance Value: 399 Ohm
Lead Channel Impedance Value: 475 Ohm
Lead Channel Impedance Value: 475 Ohm
Lead Channel Impedance Value: 475 Ohm
Lead Channel Impedance Value: 475 Ohm
Lead Channel Impedance Value: 494 Ohm
Lead Channel Impedance Value: 703 Ohm
Lead Channel Impedance Value: 703 Ohm
Lead Channel Impedance Value: 703 Ohm
Lead Channel Impedance Value: 722 Ohm
Lead Channel Impedance Value: 760 Ohm
Lead Channel Impedance Value: 760 Ohm
Lead Channel Pacing Threshold Amplitude: 0.625 V
Lead Channel Pacing Threshold Amplitude: 1.125 V
Lead Channel Pacing Threshold Amplitude: 2.5 V
Lead Channel Pacing Threshold Amplitude: 0.625 V
Lead Channel Pacing Threshold Amplitude: 1.125 V
Lead Channel Pacing Threshold Amplitude: 2.75 V
Lead Channel Pacing Threshold Pulse Width: 0.4 ms
Lead Channel Pacing Threshold Pulse Width: 0.4 ms
Lead Channel Pacing Threshold Pulse Width: 0.8 ms
Lead Channel Pacing Threshold Pulse Width: 0.4 ms
Lead Channel Pacing Threshold Pulse Width: 0.4 ms
Lead Channel Pacing Threshold Pulse Width: 0.8 ms
Lead Channel Sensing Intrinsic Amplitude: 0.875 mV
Lead Channel Sensing Intrinsic Amplitude: 0.875 mV
Lead Channel Sensing Intrinsic Amplitude: 14.125 mV
Lead Channel Sensing Intrinsic Amplitude: 14.125 mV
Lead Channel Sensing Intrinsic Amplitude: 0.5 mV
Lead Channel Sensing Intrinsic Amplitude: 0.5 mV
Lead Channel Sensing Intrinsic Amplitude: 17.75 mV
Lead Channel Sensing Intrinsic Amplitude: 17.75 mV
Lead Channel Setting Pacing Amplitude: 1.5 V
Lead Channel Setting Pacing Amplitude: 2.5 V
Lead Channel Setting Pacing Amplitude: 2.5 V
Lead Channel Setting Pacing Amplitude: 1.5 V
Lead Channel Setting Pacing Amplitude: 2.5 V
Lead Channel Setting Pacing Amplitude: 2.5 V
Lead Channel Setting Pacing Pulse Width: 0.4 ms
Lead Channel Setting Pacing Pulse Width: 0.8 ms
Lead Channel Setting Pacing Pulse Width: 0.4 ms
Lead Channel Setting Pacing Pulse Width: 0.8 ms
Lead Channel Setting Sensing Sensitivity: 0.3 mV
Lead Channel Setting Sensing Sensitivity: 0.3 mV

## 2021-06-03 LAB — CBC WITH DIFFERENTIAL/PLATELET
Abs Immature Granulocytes: 0.04 10*3/uL (ref 0.00–0.07)
Basophils Absolute: 0.1 10*3/uL (ref 0.0–0.1)
Basophils Relative: 1 %
Eosinophils Absolute: 0.2 10*3/uL (ref 0.0–0.5)
Eosinophils Relative: 2 %
HCT: 48.4 % (ref 39.0–52.0)
Hemoglobin: 16.5 g/dL (ref 13.0–17.0)
Immature Granulocytes: 0 %
Lymphocytes Relative: 21 %
Lymphs Abs: 2.3 10*3/uL (ref 0.7–4.0)
MCH: 30.5 pg (ref 26.0–34.0)
MCHC: 34.1 g/dL (ref 30.0–36.0)
MCV: 89.5 fL (ref 80.0–100.0)
Monocytes Absolute: 0.6 10*3/uL (ref 0.1–1.0)
Monocytes Relative: 6 %
Neutro Abs: 7.9 10*3/uL — ABNORMAL HIGH (ref 1.7–7.7)
Neutrophils Relative %: 70 %
Platelets: 210 10*3/uL (ref 150–400)
RBC: 5.41 MIL/uL (ref 4.22–5.81)
RDW: 13.3 % (ref 11.5–15.5)
WBC: 11.2 10*3/uL — ABNORMAL HIGH (ref 4.0–10.5)
nRBC: 0 % (ref 0.0–0.2)

## 2021-06-03 LAB — BASIC METABOLIC PANEL
Anion gap: 13 (ref 5–15)
BUN: 20 mg/dL (ref 8–23)
CO2: 22 mmol/L (ref 22–32)
Calcium: 9.3 mg/dL (ref 8.9–10.3)
Chloride: 99 mmol/L (ref 98–111)
Creatinine, Ser: 1.28 mg/dL — ABNORMAL HIGH (ref 0.61–1.24)
GFR, Estimated: >60 mL/min (ref >60)
Glucose, Bld: 184 mg/dL — ABNORMAL HIGH (ref 70–99)
Potassium: 3.5 mmol/L (ref 3.5–5.1)
Sodium: 134 mmol/L — ABNORMAL LOW (ref 135–145)

## 2021-06-03 NOTE — H&P (Signed)
Cardiology Admission History and Physical:   Patient ID: Joseph Carson MRN: [id number]; DOB: February 10, 1956   Admission date: 06/03/2021  PCP:  Rudene Christians, DO   [facility] Providers Electrophysiologist:  Lewayne Bunting, MD  Advanced Heart Failure:  Marca Ancona, MD       Chief Complaint:  ICD alarm going off.  Patient Profile:   Joseph Carson is a 66 y.o. male with VT s/p AICD who is being seen 06/03/2021 for the evaluation of CD alarm going off..  History of Present Illness:   Joseph Carson came to the ER as advised since he noticed the alarm going off from his ICD. He is a pleasant 66 yo man with a  H/o VT, PAF, CAD, and ICD lead dysfunction s/p extraction and insertion of a new system due to noise on the ICD lead. Patient states that he had his device reset last week which took care of the noise until yesterday. He again noticed the sound from his device. He does not have any arrhythmia or palpitations. The lead impedence was high as signed out to me by the day cardiologist and he was asked to come in for lead repositioning/removal. He is scared that he may need defib and device may not fire. States that he stopped taking amiodarone after he developed neuropathy. Was on dofetilide previously.  Past Medical History:  Diagnosis Date   AICD (automatic cardioverter/defibrillator) present    Atrial fibrillation, chronic (HCC)    CAD (coronary artery disease)    a. s/p CABG x 3 03/2013 (L-LAD, S-RI, S-PDA) in [place]   CAP (community acquired pneumonia) 08/22/2015   CHF (congestive heart failure) (HCC)    Chronic combined systolic and diastolic heart failure (HCC) November 2014   Coronary artery disease    Full dentures    HLD (hyperlipidemia)     Hyperbilirubinemia    Ischemic cardiomyopathy    a. echo (10/15):  EF 20%, diff HK, apical AK, mobile density inf wall near papillary muscle (?ruptured) with mod MR, mod reduced RVSF, mild PI, PASP 37 mmHg b. s/p CRTD    LBBB  (left bundle branch block)     LV (left ventricular) mural thrombus     a. TEE (10/15):  EF 15-20%, septum and ant walls AK, lat wall HK, no papillary muscle rupture, probable small thrombus on ant/ant-septal wall, mild MR, mod decreased RVSF, neg bubble study   Mitral regurgitation     mild by TEE 02/2014   NSVT (nonsustained ventricular tachycardia)         Obesity    Pulmonary hypertension (HCC)    Umbilical hernia    "have had it since age [age]"   Umbilical hernia    Wears glasses     Past Surgical History:  Procedure Laterality Date   BI-VENTRICULAR IMPLANTABLE CARDIOVERTER DEFIBRILLATOR N/A 09/01/2014   MDT CRTD implanted by Dr Ladona Ridgel   CARDIAC CATHETERIZATION  03/2013   CARDIOVERSION N/A 12/04/2017   Procedure: CARDIOVERSION;  Surgeon: Marinus Maw, MD;  Location: [facility];  Service: Cardiovascular;  Laterality: N/A;   CORONARY ARTERY BYPASS GRAFT  03/2013   LIMA to the LAD, SVG to the ramus, SVG to the PDA   CORONARY ARTERY BYPASS GRAFT  2014   CABG "X3"   FRACTURE SURGERY     ICD LEAD REMOVAL N/A 08/14/2018   Procedure: ICD LEAD REMOVAL;  Surgeon: Marinus Maw, MD;  Location: [facility];  Service: Cardiovascular;  Laterality: N/A;  INSERT / REPLACE / REMOVE PACEMAKER  2016   LEAD INSERTION N/A 08/14/2018   Procedure: LEAD INSERTION;  Surgeon: Evans Lance, MD;  Location: [facility];  Service: Cardiovascular;  Laterality: N/A;   LEAD REVISION/REPAIR N/A 12/04/2017   Procedure: LEAD REVISION/REPAIR;  Surgeon: Evans Lance, MD;  Location: [facility];  Service: Cardiovascular;  Laterality: N/A;   MULTIPLE TOOTH EXTRACTIONS     PATELLA FRACTURE SURGERY Left 1972   TEE WITHOUT CARDIOVERSION N/A 03/04/2014   Procedure: TRANSESOPHAGEAL ECHOCARDIOGRAM (TEE);  Surgeon: Larey Dresser, MD;  Location: [facility];  Service: Cardiovascular;  Laterality: N/A;   TEE WITHOUT CARDIOVERSION N/A 12/02/2017   Procedure: TRANSESOPHAGEAL ECHOCARDIOGRAM (TEE);  Surgeon:  Buford Dresser, MD;  Location: [facility];  Service: Cardiovascular;  Laterality: N/A;     Medications Prior to Admission: Prior to Admission medications   Medication Sig Start Date End Date Taking? Authorizing Provider  acetaminophen (TYLENOL) 325 MG tablet Take 1-2 tablets (325-650 mg total) by mouth every 4 (four) hours as needed for mild pain. 09/02/14   Isaiah Serge, NP  carvedilol (COREG) 6.25 MG tablet TAKE 2 TABLETS WITH FOOD TWICE DAILY 01/16/21   Evans Lance, MD  furosemide (LASIX) 40 MG tablet TAKE 1 TABLET BY MOUTH DAILY 01/16/21   Evans Lance, MD  levothyroxine (SYNTHROID) 50 MCG tablet Take 1 tablet (50 mcg total) by mouth daily before breakfast. 06/01/21   Baldwin Jamaica, PA-C  Multiple Vitamins-Minerals (HAIR/SKIN/NAILS PO) Take 1 tablet by mouth daily.    [provider]  spironolactone (ALDACTONE) 25 MG tablet TAKE 1 TABLET BY MOUTH DAILY. 01/16/21   Evans Lance, MD  warfarin (COUMADIN) 5 MG tablet TAKE 1/2 TO 1 TABLET BY MOUTH DAILY AS DIRECTED BY COUMADIN CLINIC 01/15/21   Evans Lance, MD     Allergies:   No Known Allergies  Social History:   Social History   Socioeconomic History   Marital status: Divorced    Spouse name: Not on file   Number of children: Not on file   Years of education: Not on file   Highest education level: Not on file  Occupational History   Not on file  Tobacco Use   Smoking status: Every Day    Packs/day: 0.25    Years: 47.00    Pack years: 11.75    Types: Cigarettes   Smokeless tobacco: Never   Tobacco comments:    3-4 per day   Vaping Use   Vaping Use: Never used  Substance and Sexual Activity   Alcohol use: Not Currently    Alcohol/week: 0.0 standard drinks    Comment: "quit drinking when I was 23"   Drug use: Yes    Types: Marijuana    Comment:  "weekly" last use week of 08/07/2018   Sexual activity: Not Currently  Other Topics Concern   Not on file  Social History Narrative   ** Merged  History Encounter **       Social Determinants of Health   Financial Resource Strain: Not on file  Food Insecurity: Not on file  Transportation Needs: Not on file  Physical Activity: Not on file  Stress: Not on file  Social Connections: Not on file  Intimate Partner Violence: Not on file    Family History:   The patient's family history includes Diabetes in his mother; Thyroid disease in his mother.    ROS:  Please see the history of present illness.  All  other ROS reviewed and negative.     Physical Exam/Data:   Vitals:   06/03/21 1807  BP: (!) 124/105  Pulse: 68  Resp: 18  Temp: 97.7 F (36.5 C)  TempSrc: Oral  SpO2: 95%   No intake or output data in the 24 hours ending 06/03/21 2321 Last 3 Weights 05/29/2021 04/03/2021 03/26/2021  Weight (lbs) 216 lb 210 lb 9.6 oz 220 lb  Weight (kg) 97.977 kg 95.528 kg 99.791 kg     There is no height or weight on file to calculate BMI.  General:  Well nourished, well developed, in no acute distress HEENT: normal Neck: no JVD Vascular: No carotid bruits; Distal pulses 2+ bilaterally   Cardiac:  normal S1, S2; Irregular; no murmur  Lungs:  clear to auscultation bilaterally, no wheezing, rhonchi or rales  Abd: soft, nontender, no hepatomegaly  Ext: no edema Musculoskeletal:  No deformities, BUE and BLE strength normal and equal Skin: warm and dry  Neuro:  CNs 2-12 intact, no focal abnormalities noted Psych:  Normal affect   Laboratory Data:  Chemistry Recent Labs  Lab 06/03/21 1826  NA 134*  K 3.5  CL 99  CO2 22  GLUCOSE 184*  BUN 20  CREATININE 1.28*  CALCIUM 9.3  GFRNONAA >60  ANIONGAP 13    No results for input(s): PROT, ALBUMIN, AST, ALT, ALKPHOS, BILITOT in the last 168 hours. Lipids No results for input(s): CHOL, TRIG, HDL, LABVLDL, LDLCALC, CHOLHDL in the last 168 hours. Hematology Recent Labs  Lab 06/03/21 1826  WBC 11.2*  RBC 5.41  HGB 16.5  HCT 48.4  MCV 89.5  MCH 30.5  MCHC 34.1  RDW 13.3   PLT 210   Thyroid  Recent Labs  Lab 05/29/21 1555  TSH 33.500*   Echo: IMPRESSIONS     1. Left ventricular ejection fraction, by estimation, is <20%. The left  ventricle has severely decreased function. The left ventricle demonstrates  global hypokinesis. The left ventricular internal cavity size was severely  dilated. Left ventricular  diastolic parameters are consistent with Grade I diastolic dysfunction  (impaired relaxation).   2. Right ventricular systolic function is normal. The right ventricular  size is normal. There is normal pulmonary artery systolic pressure. The  estimated right ventricular systolic pressure is [id number] mmHg.   3. Left atrial size was moderately dilated.   4. Right atrial size was mildly dilated.   5. The mitral valve is normal in structure. Mild mitral valve  regurgitation. No evidence of mitral stenosis.   6. The aortic valve is normal in structure. Aortic valve regurgitation is  not visualized. No aortic stenosis is present.   7. The inferior vena cava is normal in size with greater than 50%  respiratory variability, suggesting right atrial pressure of 3 mmHg.   EKG: Sinus with BiV pacing.  BNPNo results for input(s): BNP, PROBNP in the last 168 hours.  DDimer No results for input(s): DDIMER in the last 168 hours.   Radiology/Studies:  CUP PACEART REMOTE DEVICE CHECK  Result Date: 06/03/2021 Scheduled remote reviewed. Normal device function.  High RV defib lead impedance on 06/02/2021, set to triage, see previous report. LV amplitude programmed sub threshold, Sent to triage per protocol Next remote 91 days. Kathy Breach, RN, CCDS, [employer]    Assessment and Plan:   1. PAF - Was in NSR. Has irregular heart beat now. EKG now shows sinus with BiV pacing On warfarin for anticoagulation. 2. ICD/VT - Alarm going  off. He has had a single spurious high voltage impedence. Plan to be seen by EP. Wants Dr. Lovena Le to be his EP. 3. Chronic  systolic heart failure - his symptoms are class 2. He will continue his current meds. Will start on low dose losartan. Consider entresto if able to afford. 4. CAD - he denies anginal symptoms. On home meds.   Severity of Illness: The appropriate patient status for this patient is OBSERVATION. Observation status is judged to be reasonable and necessary in order to provide the required intensity of service to ensure the patient's safety. The patient's presenting symptoms, physical exam findings, and initial radiographic and laboratory data in the context of their medical condition is felt to place them at decreased risk for further clinical deterioration. Furthermore, it is anticipated that the patient will be medically stable for discharge from the hospital within 2 midnights of admission.    For questions or updates, please contact [place] Please consult [email or web address] for contact info under     Signed, Robinette Haines, MD  06/03/2021 11:21 PM

## 2021-06-03 NOTE — ED Provider Triage Note (Signed)
Emergency Medicine Provider Triage Evaluation Note  Joseph Carson , a 66 y.o. male  was evaluated in triage.  Pt complains of pacemaker alarming.  States he was evaluated by electrophysiology last week for same problem.  His pacemaker started alarming last evening.  States that he was told to come to the emergency department tonight.  No chest pain, shortness of breath.  Defib has not fired.  Review of Systems  Positive: Fatigue Negative: Chest pain  Physical Exam  BP (!) 124/105 (BP Location: Left Arm)    Pulse 68    Temp 97.7 F (36.5 C) (Oral)    Resp 18    SpO2 95%  Gen:   Awake, no distress   Resp:  Normal effort  MSK:   Moves extremities without difficulty  Other:  Regular heart rate, no murmurs, no alarm at time of exam  Medical Decision Making  Medically screening exam initiated at 6:23 PM.  Appropriate orders placed.  Stann Ore was informed that the remainder of the evaluation will be completed by another provider, this initial triage assessment does not replace that evaluation, and the importance of remaining in the ED until their evaluation is complete.     Renne Crigler, PA-C 06/03/21 1824

## 2021-06-03 NOTE — ED Triage Notes (Signed)
C/o pacemaker alarming every 4 hours since last night.  Denies any symptoms.  States he feels fine.  States it was alarming last week and he saw Dr. Ladona Ridgel and had adjustments made to it last week.

## 2021-06-03 NOTE — Telephone Encounter (Signed)
Pt called because the device is making noise. The noise is biphasic and is occurring every 6 hours.  It is loud and keeping him awake.   He denies chest pain, SOB, palpitations, presyncope or syncope. The device has not fired.  Device was interrogated and showed increased defib lead impedance. This has progressed to the point that effective defib is not guaranteed.  He has not had any VT.  His thoracic impedance is normal, no volume overload.   Spoke w/ Dr Shari Prows, Dr Graciela Husbands, A. Tillery PAC. Dr Ladona Ridgel contacted as well.  Best plan is for him to come to the ER.  He needs to be roomed ASAP so the device can be managed.   The defib will probably have to be turned off, but he needs to be on telemetry for this.  EP to see in am, for definitive management.   Theodore Demark, PA-C 06/03/2021 4:59 PM

## 2021-06-04 ENCOUNTER — Telehealth: Payer: Self-pay

## 2021-06-04 ENCOUNTER — Observation Stay (HOSPITAL_COMMUNITY): Payer: Medicare Other

## 2021-06-04 DIAGNOSIS — Z95810 Presence of automatic (implantable) cardiac defibrillator: Secondary | ICD-10-CM

## 2021-06-04 DIAGNOSIS — I472 Ventricular tachycardia, unspecified: Secondary | ICD-10-CM

## 2021-06-04 DIAGNOSIS — T829XXD Unspecified complication of cardiac and vascular prosthetic device, implant and graft, subsequent encounter: Secondary | ICD-10-CM | POA: Diagnosis not present

## 2021-06-04 LAB — PROTIME-INR
INR: 2.2 — ABNORMAL HIGH (ref 0.8–1.2)
Prothrombin Time: 24.1 seconds — ABNORMAL HIGH (ref 11.4–15.2)

## 2021-06-04 LAB — CBC
HCT: 47.3 % (ref 39.0–52.0)
Hemoglobin: 15.8 g/dL (ref 13.0–17.0)
MCH: 30.5 pg (ref 26.0–34.0)
MCHC: 33.4 g/dL (ref 30.0–36.0)
MCV: 91.3 fL (ref 80.0–100.0)
Platelets: 170 10*3/uL (ref 150–400)
RBC: 5.18 MIL/uL (ref 4.22–5.81)
RDW: 13.4 % (ref 11.5–15.5)
WBC: 10 10*3/uL (ref 4.0–10.5)
nRBC: 0 % (ref 0.0–0.2)

## 2021-06-04 LAB — BASIC METABOLIC PANEL
Anion gap: 8 (ref 5–15)
BUN: 19 mg/dL (ref 8–23)
CO2: 24 mmol/L (ref 22–32)
Calcium: 9 mg/dL (ref 8.9–10.3)
Chloride: 104 mmol/L (ref 98–111)
Creatinine, Ser: 1.09 mg/dL (ref 0.61–1.24)
GFR, Estimated: >60 mL/min (ref >60)
Glucose, Bld: 92 mg/dL (ref 70–99)
Potassium: 3.6 mmol/L (ref 3.5–5.1)
Sodium: 136 mmol/L (ref 135–145)

## 2021-06-04 LAB — RESP PANEL BY RT-PCR (FLU A&B, COVID) ARPGX2
Influenza A by PCR: NEGATIVE
Influenza B by PCR: NEGATIVE
SARS Coronavirus 2 by RT PCR: NEGATIVE

## 2021-06-04 MED ORDER — HAIR/SKIN/NAILS PO TABS: ORAL_TABLET | Freq: Every day | ORAL | Status: DC

## 2021-06-04 MED ORDER — LOSARTAN POTASSIUM 50 MG PO TABS
25.0000 mg | ORAL_TABLET | Freq: Every day | ORAL | Status: DC
  Administered 2021-06-04: 25 mg via ORAL
  Filled 2021-06-04: qty 1

## 2021-06-04 MED ORDER — WARFARIN SODIUM 2.5 MG PO TABS: 2.5000 mg | ORAL_TABLET | ORAL | Status: DC

## 2021-06-04 MED ORDER — ONDANSETRON HCL 4 MG/2ML IJ SOLN: 4.0000 mg | Freq: Four times a day (QID) | INTRAMUSCULAR | Status: DC | PRN

## 2021-06-04 MED ORDER — LEVOTHYROXINE SODIUM 50 MCG PO TABS
50.0000 ug | ORAL_TABLET | Freq: Every day | ORAL | Status: DC
  Administered 2021-06-04: 50 ug via ORAL
  Filled 2021-06-04: qty 2

## 2021-06-04 MED ORDER — ACETAMINOPHEN 325 MG PO TABS: 325.0000 mg | ORAL_TABLET | ORAL | Status: DC | PRN

## 2021-06-04 MED ORDER — FUROSEMIDE 20 MG PO TABS
40.0000 mg | ORAL_TABLET | Freq: Every day | ORAL | Status: DC
  Administered 2021-06-04: 40 mg via ORAL
  Filled 2021-06-04: qty 2

## 2021-06-04 MED ORDER — SPIRONOLACTONE 25 MG PO TABS
25.0000 mg | ORAL_TABLET | Freq: Every day | ORAL | Status: DC
  Administered 2021-06-04: 25 mg via ORAL
  Filled 2021-06-04 (×2): qty 1

## 2021-06-04 MED ORDER — LOSARTAN POTASSIUM 25 MG PO TABS: 25.0000 mg | ORAL_TABLET | Freq: Every day | ORAL | 5 refills | Status: DC

## 2021-06-04 MED ORDER — CARVEDILOL 3.125 MG PO TABS
6.2500 mg | ORAL_TABLET | Freq: Two times a day (BID) | ORAL | Status: DC
  Administered 2021-06-04: 6.25 mg via ORAL
  Filled 2021-06-04: qty 2

## 2021-06-04 MED ORDER — WARFARIN SODIUM 5 MG PO TABS
5.0000 mg | ORAL_TABLET | ORAL | Status: DC
  Filled 2021-06-04: qty 1

## 2021-06-04 MED ORDER — WARFARIN SODIUM 5 MG PO TABS: 5.0000 mg | ORAL_TABLET | Freq: Every day | ORAL | Status: DC

## 2021-06-04 NOTE — Progress Notes (Signed)
ANTICOAGULATION CONSULT NOTE - Initial Consult  Pharmacy Consult for Warfarin  Indication:  Atrial fibrillation, history of LV thrombus  No Known Allergies    Vital Signs: Temp: 97.7 F (36.5 C) (01/15 1807) Temp Source: Oral (01/15 1807) BP: 139/82 (01/16 0317) Pulse Rate: 60 (01/16 0317)  Labs: Recent Labs    06/03/21 1826  HGB 16.5  HCT 48.4  PLT 210  CREATININE 1.28*    Estimated Creatinine Clearance: 67.5 mL/min (A) (by C-G formula based on SCr of 1.28 mg/dL (H)).   Medical History: Past Medical History:  Diagnosis Date   AICD (automatic cardioverter/defibrillator) present    Atrial fibrillation, chronic (HCC)    CAD (coronary artery disease)    a. s/p CABG x 3 03/2013 (L-LAD, S-RI, S-PDA) in [place]   CAP (community acquired pneumonia) 08/22/2015   CHF (congestive heart failure) (HCC)    Chronic combined systolic and diastolic heart failure ([place]) November 2014   Coronary artery disease    Full dentures    HLD (hyperlipidemia)     Hyperbilirubinemia    Ischemic cardiomyopathy    a. echo (10/15):  EF 20%, diff HK, apical AK, mobile density inf wall near papillary muscle (?ruptured) with mod MR, mod reduced RVSF, mild PI, PASP 37 mmHg b. s/p CRTD    LBBB (left bundle branch block)     LV (left ventricular) mural thrombus     a. TEE (10/15):  EF 15-20%, septum and ant walls AK, lat wall HK, no papillary muscle rupture, probable small thrombus on ant/ant-septal wall, mild MR, mod decreased RVSF, neg bubble study   Mitral regurgitation     mild by TEE 02/2014   NSVT (nonsustained ventricular tachycardia)         Obesity    Pulmonary hypertension ([place])    Umbilical hernia    "have had it since age [id number]"   Umbilical hernia    Wears glasses    Assessment: 66 y/o M in the ED with ICD alarm going off. On warfarin PTA for afib/history of LV thrombus. No INR yet.   Goal of Therapy:  INR 2-3 Monitor platelets by anticoagulation protocol: Yes   Plan:  INR with AM  labs to assess dosing needs  Narda Bonds, PharmD, BCPS Clinical Pharmacist Phone: [phone]

## 2021-06-04 NOTE — Discharge Instructions (Addendum)
Please go across the stress to Dr. Tanna Furry office, ask for [facility] (device clinic nurse), she has a magnet for you.  While not likely to happen,  As Dr. Lovena Le discussed. If your starts to shock you inappropriately (you are feeling well), place the magnet over the device and seek attention.

## 2021-06-04 NOTE — ED Provider Notes (Signed)
[facility] EMERGENCY DEPARTMENT Provider Note   CSN: [id number] Arrival date & time: 06/03/21  1802     History  Chief Complaint  Patient presents with   Pacemaker Problem    Joseph Carson is a 66 y.o. male.  The history is provided by the patient and medical records.   66 year old male with history of CHF, AICD placement, persistent A. fib on Coumadin, presenting to the ED with his pacemaker alarming.  Seen by EP and cardiology clinic last week for same.  States last evening and has been going off about every 3-4 hours.  He denies any chest pain or shortness of breath.  Does not feel like his defibrillator has fired.  Home Medications Prior to Admission medications   Medication Sig Start Date End Date Taking? Authorizing Provider  acetaminophen (TYLENOL) 325 MG tablet Take 1-2 tablets (325-650 mg total) by mouth every 4 (four) hours as needed for mild pain. 09/02/14   Isaiah Serge, NP  carvedilol (COREG) 6.25 MG tablet TAKE 2 TABLETS WITH FOOD TWICE DAILY 01/16/21   Evans Lance, MD  furosemide (LASIX) 40 MG tablet TAKE 1 TABLET BY MOUTH DAILY 01/16/21   Evans Lance, MD  levothyroxine (SYNTHROID) 50 MCG tablet Take 1 tablet (50 mcg total) by mouth daily before breakfast. 06/01/21   Baldwin Jamaica, PA-C  Multiple Vitamins-Minerals (HAIR/SKIN/NAILS PO) Take 1 tablet by mouth daily.    [provider]  spironolactone (ALDACTONE) 25 MG tablet TAKE 1 TABLET BY MOUTH DAILY. 01/16/21   Evans Lance, MD  warfarin (COUMADIN) 5 MG tablet TAKE 1/2 TO 1 TABLET BY MOUTH DAILY AS DIRECTED BY COUMADIN CLINIC 01/15/21   Evans Lance, MD      Allergies    Patient has no known allergies.    Review of Systems   Review of Systems  Cardiovascular:        Pacemaker alarming  All other systems reviewed and are negative.  Physical Exam Updated Vital Signs BP (!) 149/72    Pulse (!) 108    Temp 97.7 F (36.5 C) (Oral)    Resp 16    SpO2 96%    Physical Exam Vitals and nursing note reviewed.  Constitutional:      Appearance: He is well-developed.  HENT:     Head: Normocephalic and atraumatic.  Eyes:     Conjunctiva/sclera: Conjunctivae normal.     Pupils: Pupils are equal, round, and reactive to light.  Cardiovascular:     Rate and Rhythm: Normal rate and regular rhythm.     Heart sounds: Normal heart sounds.  Pulmonary:     Effort: Pulmonary effort is normal. No respiratory distress.     Breath sounds: Normal breath sounds. No wheezing or rhonchi.     Comments: Device left chest wall Abdominal:     General: Bowel sounds are normal.     Palpations: Abdomen is soft.  Musculoskeletal:        General: Normal range of motion.     Cervical back: Normal range of motion.  Skin:    General: Skin is warm and dry.  Neurological:     Mental Status: He is alert and oriented to person, place, and time.    ED Results / Procedures / Treatments   Labs (all labs ordered are listed, but only abnormal results are displayed) Labs Reviewed  BASIC METABOLIC PANEL - Abnormal; Notable for the following components:      Result Value  Sodium 134 (*)    Glucose, Bld 184 (*)    Creatinine, Ser 1.28 (*)    All other components within normal limits  CBC WITH DIFFERENTIAL/PLATELET - Abnormal; Notable for the following components:   WBC 11.2 (*)    Neutro Abs 7.9 (*)    All other components within normal limits  CBG MONITORING, ED    EKG EKG Interpretation  Date/Time:  Sunday June 03 2021 18:13:12 EST Ventricular Rate:  68 PR Interval:  172 QRS Duration: 210 QT Interval:  528 QTC Calculation: 561 R Axis:   207 Text Interpretation: Atrial-sensed ventricular-paced rhythm Abnormal ECG When compared with ECG of 26-Mar-2021 07:05, PREVIOUS ECG IS PRESENT Confirmed by Noemi Chapel [phone]) on 06/03/2021 10:00:55 PM  Radiology CUP PACEART REMOTE DEVICE CHECK  Result Date: 06/03/2021 Scheduled remote reviewed. Normal device  function.  High RV defib lead impedance on 06/02/2021, set to triage, see previous report. LV amplitude programmed sub threshold, Sent to triage per protocol Next remote 91 days. Kathy Breach, RN, CCDS, [employer]   Procedures Procedures  CRITICAL CARE Performed by: Larene Pickett   Total critical care time: 35 minutes  Critical care time was exclusive of separately billable procedures and treating other patients.  Critical care was necessary to treat or prevent imminent or life-threatening deterioration.  Critical care was time spent personally by me on the following activities: development of treatment plan with patient and/or surrogate as well as nursing, discussions with consultants, evaluation of patient's response to treatment, examination of patient, obtaining history from patient or surrogate, ordering and performing treatments and interventions, ordering and review of laboratory studies, ordering and review of radiographic studies, pulse oximetry and re-evaluation of patient's condition.   Medications Ordered in ED Medications - No data to display  ED Course/ Medical Decision Making/ A&P                           Medical Decision Making  66 year old male presenting to the ED with pacemaker alarming.  Has not felt his defibrillator fire.  He was seen in EP clinic last week for same and adjustments made.  Continues to alarm every 3-4 hours.  Denies any chest pain or shortness of breath.  Labs ordered and reviewed-- all are reassuring.  EKG is nonischemic.  Device has been interrogated, report pending.  Cardiology has evaluated in the ED, will admit.  EP team to follow in AM.    Final Clinical Impression(s) / ED Diagnoses Final diagnoses:  Malfunction of implantable defibrillator ventricular (ICD) lead  Abnormal thyroid blood test    Rx / DC Orders ED Discharge Orders     None         Larene Pickett, PA-C 06/04/21 [place], DO 06/04/21  0410

## 2021-06-04 NOTE — Consult Note (Addendum)
Cardiology Consultation   Patient ID: Joseph Carson MRN: [id number]; DOB: July 03, 1955  Admit date: 06/03/2021 Date of Consult: 06/04/2021  PCP:  Joseph Fuchs, DO   [facility] Providers Cardiologist:  None  Electrophysiologist:  Joseph Peru, MD  Advanced Heart Failure:  Joseph Champagne, MD  {      Patient Profile:   Joseph Carson is a 66 y.o. male with a hx of VT, ICD, AFib, CAD (CABG 2014), chronic CHF (combined), HTN, HLD, LBBB, LV thrombus (2015). who is being seen 06/04/2021 for the evaluation of ICD alarm at the request of Joseph Carson.   Device information MDT CRT-D implanted 10/14/2018 RA and CS leads are from  09/01/2014 Original RV lead extracted 2/2 noise, 10/14/2018   AAD Hx Tikosyn stopped >>  Amiodarone started  >> stopped Nov 2022 2/2 myalgias  History of Present Illness:   Joseph Carson of late has had a few recent hospitalizations and office visits  He had a hospitalization 11/30/20 with multiple ICD shocks appropriate 2/2 VT/VF, recent/ongoing symptoms of cough/chills, feeling unwell. Admitted with VT/VF, COVID +, not felt to be volume OL Seen by EP, Joseph Carson, suspect COVID as one of the main drivers for recent functional decline and lead into his VT/VF, shocks. Tiksoyn was stopped and started amiodarone. Discharged 12/03/20   He had f/u with Joseph Carson , mentioned noise on the A lead with plans to monitor, discussed appropriate therapies for VT, reminded of no driving, no changes were made   Device clinic note 03/15/21 for an alert 2/2 RV defib impedance,  >160 on 03/13/21.  lead impedence testing today reflected RV defib impedence of 96 ohms consistent with trends, no changes were made.   He had an ER visit 03/26/21 with progressive Carson weakness, falls, neuroapthy sounding symptoms. In d/w myself recommended continue amioadrone, pending his neurology evaluation CK 166, B12 wnl Neuro saw him Discussed likely multifactorial with sedentary  lifestyle and Recommended stopping lipitor and amiodarone If not improved further neuro eval recommended   After d/w Joseph Carson, cleared to stop amio and planned for EP follow up TSH quite high 30.704  He saw Joseph Carson 05/29/21, myalgias were much improved off amio and statin.  He did mention "note some noise on his atrial lead. We will follow. He has had a single spurious high voltage impedence and we will follow. Hopefully he will not need another ICD lead"  TSH remains elevated last week, recommended to start synthroid and f/u with PMD  He was admitted to [facility] yesterday where he came 2/2 his device alarming, with concerns of potential lead failure was advised to come in  LABS K+ 3.6 BUN/Creat 19/1.09 WBC 10.0 H/H 15/47 Plts 170  INR 2.1  He feels well, no CP, palpitations or SOB   Past Medical History:  Diagnosis Date   AICD (automatic cardioverter/defibrillator) present    Atrial fibrillation, chronic ([place])    CAD (coronary artery disease)    a. s/p CABG x 3 03/2013 (L-LAD, S-RI, S-PDA) in [place]   CAP (community acquired pneumonia) 08/22/2015   CHF (congestive heart failure) ([place])    Chronic combined systolic and diastolic heart failure ([place]) November 2014   Coronary artery disease    Full dentures    HLD (hyperlipidemia)     Hyperbilirubinemia    Ischemic cardiomyopathy    a. echo (10/15):  EF 20%, diff HK, apical AK, mobile density inf wall near papillary muscle (?ruptured) with mod MR, mod reduced RVSF, mild  PI, PASP 37 mmHg b. s/p CRTD    LBBB (left bundle branch block)     LV (left ventricular) mural thrombus     a. TEE (10/15):  EF 15-20%, septum and ant walls AK, lat wall HK, no papillary muscle rupture, probable small thrombus on ant/ant-septal wall, mild MR, mod decreased RVSF, neg bubble study   Mitral regurgitation     mild by TEE 02/2014   NSVT (nonsustained ventricular tachycardia)         Obesity    Pulmonary hypertension ([place])    Umbilical hernia     "have had it since age [id number]"   Umbilical hernia    Wears glasses     Past Surgical History:  Procedure Laterality Date   BI-VENTRICULAR IMPLANTABLE CARDIOVERTER DEFIBRILLATOR N/A 09/01/2014   MDT CRTD implanted by Dr Joseph Carson   CARDIAC CATHETERIZATION  03/2013   CARDIOVERSION N/A 12/04/2017   Procedure: CARDIOVERSION;  Surgeon: Joseph Lance, MD;  Location: [facility];  Service: Cardiovascular;  Laterality: N/A;   CORONARY ARTERY BYPASS GRAFT  03/2013   LIMA to the LAD, SVG to the ramus, SVG to the PDA   CORONARY ARTERY BYPASS GRAFT  2014   CABG "X3"   FRACTURE SURGERY     ICD LEAD REMOVAL N/A 08/14/2018   Procedure: ICD LEAD REMOVAL;  Surgeon: Joseph Lance, MD;  Location: [facility];  Service: Cardiovascular;  Laterality: N/A;   INSERT / REPLACE / REMOVE PACEMAKER  2016   LEAD INSERTION N/A 08/14/2018   Procedure: LEAD INSERTION;  Surgeon: Joseph Lance, MD;  Location: [facility];  Service: Cardiovascular;  Laterality: N/A;   LEAD REVISION/REPAIR N/A 12/04/2017   Procedure: LEAD REVISION/REPAIR;  Surgeon: Joseph Lance, MD;  Location: [place] [facility];  Service: Cardiovascular;  Laterality: N/A;   MULTIPLE TOOTH EXTRACTIONS     PATELLA FRACTURE SURGERY Left 1972   TEE WITHOUT CARDIOVERSION N/A 03/04/2014   Procedure: TRANSESOPHAGEAL ECHOCARDIOGRAM (TEE);  Surgeon: Larey Dresser, MD;  Location: [facility];  Service: Cardiovascular;  Laterality: N/A;   TEE WITHOUT CARDIOVERSION N/A 12/02/2017   Procedure: TRANSESOPHAGEAL ECHOCARDIOGRAM (TEE);  Surgeon: Buford Dresser, MD;  Location: [facility];  Service: Cardiovascular;  Laterality: N/A;     Home Medications:  Prior to Admission medications   Medication Sig Start Date End Date Taking? Authorizing Provider  carvedilol (COREG) 6.25 MG tablet TAKE 2 TABLETS WITH FOOD TWICE DAILY Patient taking differently: Take 6.25 mg by mouth 2 (two) times daily with a meal. 01/16/21  Yes Joseph Lance, MD  furosemide (LASIX) 40 MG  tablet TAKE 1 TABLET BY MOUTH DAILY Patient taking differently: Take 40 mg by mouth daily. 01/16/21  Yes Joseph Lance, MD  levothyroxine (SYNTHROID) 50 MCG tablet Take 1 tablet (50 mcg total) by mouth daily before breakfast. 06/01/21  Yes Baldwin Jamaica, PA-C  Multiple Vitamins-Minerals (HAIR/SKIN/NAILS PO) Take 1 tablet by mouth daily.   Yes [provider]  spironolactone (ALDACTONE) 25 MG tablet TAKE 1 TABLET BY MOUTH DAILY. Patient taking differently: Take 25 mg by mouth daily. 01/16/21  Yes Joseph Lance, MD  warfarin (COUMADIN) 5 MG tablet TAKE 1/2 TO 1 TABLET BY MOUTH DAILY AS DIRECTED BY COUMADIN CLINIC Patient taking differently: Take 5 mg by mouth daily at 4 PM. TAKE 1/2 TO 1 TABLET BY MOUTH DAILY AS DIRECTED BY COUMADIN CLINIC 01/15/21  Yes Joseph Lance, MD  acetaminophen (TYLENOL) 325 MG tablet Take 1-2 tablets (325-650 mg  total) by mouth every 4 (four) hours as needed for mild pain. Patient not taking: Reported on 06/04/2021 09/02/14   Isaiah Serge, NP    Inpatient Medications: Scheduled Meds:  carvedilol  6.25 mg Oral BID WC   furosemide  40 mg Oral Daily   levothyroxine  50 mcg Oral QAC breakfast   losartan  25 mg Oral Daily   spironolactone  25 mg Oral Daily   Continuous Infusions:  PRN Meds: acetaminophen, ondansetron (ZOFRAN) IV  Allergies:   No Known Allergies  Social History:   Social History   Socioeconomic History   Marital status: Divorced    Spouse name: Not on file   Number of children: Not on file   Years of education: Not on file   Highest education level: Not on file  Occupational History   Not on file  Tobacco Use   Smoking status: Every Day    Packs/day: 0.25    Years: 47.00    Pack years: 11.75    Types: Cigarettes   Smokeless tobacco: Never   Tobacco comments:    3-4 per day   Vaping Use   Vaping Use: Never used  Substance and Sexual Activity   Alcohol use: Not Currently    Alcohol/week: 0.0 standard drinks     Comment: "quit drinking when I was 23"   Drug use: Yes    Types: Marijuana    Comment:  "weekly" last use week of 08/07/2018   Sexual activity: Not Currently  Other Topics Concern   Not on file  Social History Narrative   ** Merged History Encounter **       Social Determinants of Health   Financial Resource Strain: Not on file  Food Insecurity: Not on file  Transportation Needs: Not on file  Physical Activity: Not on file  Stress: Not on file  Social Connections: Not on file  Intimate Partner Violence: Not on file    Family History:   Family History  Problem Relation Age of Onset   Diabetes Mother        Deceased   Thyroid disease Mother      ROS:  Please see the history of present illness.  All other ROS reviewed and negative.     Physical Exam/Data:   Vitals:   06/04/21 0317 06/04/21 0500 06/04/21 0600 06/04/21 0610  BP: 139/82 114/81 118/72   Pulse: 60 (!) 58 (!) 59 (!) 58  Resp: 14 17 16  (!) 21  Temp:    (!) 97.2 F (36.2 C)  TempSrc:    Temporal  SpO2: 99% 94% 91% 94%   No intake or output data in the 24 hours ending 06/04/21 0707 Last 3 Weights 05/29/2021 04/03/2021 03/26/2021  Weight (lbs) 216 lb 210 lb 9.6 oz 220 lb  Weight (kg) 97.977 kg 95.528 kg 99.791 kg     There is no height or weight on file to calculate BMI.  General:  Well nourished, well developed, in no acute distress HEENT: normal Neck: no JVD Vascular: No carotid bruits; Distal pulses 2+ bilaterally Cardiac:  RRR; no murmur s, gallops or rubs Lungs:  CTA b/l, no wheezing, rhonchi or rales  Abd: soft, nontender, no hepatomegaly  Ext: no edema Musculoskeletal:  No deformities,  Skin: warm and dry  Neuro: no gross focal abnormalities noted Psych:  Normal affect   EKG:  The EKG was personally reviewed and demonstrates:   SR 62bpm, V paced Telemetry:  Telemetry was personally reviewed and demonstrates:  SR/ V pacoing, AV pacing, infrequent PVCs, rare couplet, 3 beat V salvo  Relevant  CV Studies:  12/02/2020: TTE IMPRESSIONS   1. Left ventricular ejection fraction, by estimation, is <20%. The left  ventricle has severely decreased function. The left ventricle demonstrates  global hypokinesis. The left ventricular internal cavity size was severely  dilated. Left ventricular  diastolic parameters are consistent with Grade I diastolic dysfunction  (impaired relaxation).   2. Right ventricular systolic function is normal. The right ventricular  size is normal. There is normal pulmonary artery systolic pressure. The  estimated right ventricular systolic pressure is [id number] mmHg.   3. Left atrial size was moderately dilated.   4. Right atrial size was mildly dilated.   5. The mitral valve is normal in structure. Mild mitral valve  regurgitation. No evidence of mitral stenosis.   6. The aortic valve is normal in structure. Aortic valve regurgitation is  not visualized. No aortic stenosis is present.   7. The inferior vena cava is normal in size with greater than 50%  respiratory variability, suggesting right atrial pressure of 3 mmHg.   Laboratory Data:  High Sensitivity Troponin:  No results for input(s): TROPONINIHS in the last 720 hours.   Chemistry Recent Labs  Lab 06/03/21 1826 06/04/21 0445  NA 134* 136  K 3.5 3.6  CL 99 104  CO2 22 24  GLUCOSE 184* 92  BUN 20 19  CREATININE 1.28* 1.09  CALCIUM 9.3 9.0  GFRNONAA >60 >60  ANIONGAP 13 8    No results for input(s): PROT, ALBUMIN, AST, ALT, ALKPHOS, BILITOT in the last 168 hours. Lipids No results for input(s): CHOL, TRIG, HDL, LABVLDL, LDLCALC, CHOLHDL in the last 168 hours.  Hematology Recent Labs  Lab 06/03/21 1826 06/04/21 0445  WBC 11.2* 10.0  RBC 5.41 5.18  HGB 16.5 15.8  HCT 48.4 47.3  MCV 89.5 91.3  MCH 30.5 30.5  MCHC 34.1 33.4  RDW 13.3 13.4  PLT 210 170   Thyroid  Recent Labs  Lab 05/29/21 1555  TSH 33.500*    BNPNo results for input(s): BNP, PROBNP in the last 168 hours.  DDimer  No results for input(s): DDIMER in the last 168 hours.   Radiology/Studies:  DG Chest Port 1 View Result Date: 06/04/2021 CLINICAL DATA:  Pacemaker malfunction. EXAM: PORTABLE CHEST 1 VIEW COMPARISON:  Portable chest 11/30/2020. FINDINGS: The heart enlarged but unchanged, with old CABG changes. No vascular congestion is seen. Stable mediastinum with minor aortic tortuosity, with patchy calcification in the arch. Left chest dual lead pacing system with an AID wire, and wire insertions are unaltered. The lungs hyperexpanded but clear. The sulci are sharp. Osteopenia. IMPRESSION: Cardiomegaly with left chest pacing system with stable wire insertions. No evidence of acute chest disease or interval changes. Electronically Signed   By: Telford Nab M.D.   On: 06/04/2021 03:56      Assessment and Plan:   ICD Alarm for elevated RV defib impedance Will have industry run a few lead impedance checks, last looked ok again at 116Ohms   ADDEND: Device check by industry LV lead threshold is OK maintaining 1/4V safety margin RV defib alarm Impedance checks today are consistently 90's Alarm changed from >160ohms to 200ohms (max allowed) A lead noise with isometrics,  (known), lead measurements are stable, no AMS episodes (true or false) are noted   VT Admitted July 2022 with multiple appropriate shocks, started on amiodarone Amio stopped Nov 2022 2/2 myalgias (lipitor also stopped)  Feeling better off meds   CAD No anginal symptoms No ASA w/warfarin Off statin w/myalgias   Chronic CHF (systolic) ICM No symptoms or exam findings of volume OL Historically BP and not allowed an ARB Losartan added here OptiVol looks good   Paroxysmal Afib CHA2DS2Vasc is 6, on warfarin 0 % burden Off tiksoyn July 2022 in setting of VT/VF Off amiodarone Nov 2022 2/2 myalgias INR 2.1     HTN Has had relative hypotension in the past Looks good Perhaps now will allow low dose ARB, though he reminds me  his BP usually lower  8. Hypothyroid Probably 2/2 amio Recently started in synthroid   Suspect discharge today after MD sees him  Risk Assessment/Risk Scores:    For questions or updates, please contact [facility] Please consult [email or web address] for contact info under    Signed, Baldwin Jamaica, PA-C  06/04/2021 7:07 AM  EP Attending  Patient seen and examined. See my comments in the discharge summary.  Carleene Overlie Cheril Slattery,MD

## 2021-06-04 NOTE — Telephone Encounter (Signed)
-----   Message from Shirley Friar, [place] sent at 06/03/2021  4:00 PM EST ----- May need DC visit Wednesday for device check with industry. Impedence elevated and it seems like LV threshold may have also increased.   Had visit with GT 1/10 where noise was also noted.

## 2021-06-04 NOTE — Telephone Encounter (Signed)
Patient currently hospitalized for alarming device. Spoke with industry who is aware and states plan of action with Dr. Caryl Comes and Tommye Standard, PA is in action. Device will be interrogated and thresholds adjusted today. Continue to monitor remotely.

## 2021-06-04 NOTE — Discharge Summary (Addendum)
DISCHARGE SUMMARY     Patient ID: Joseph Carson,  MRN: [id number], DOB/AGE: 66-Sep-1957 66 y.o.  Admit date: 06/03/2021 Discharge date: 06/04/2021  Primary Care Physician: [name], DO  Electrophysiologist: Dr. Lovena Le  Primary Discharge Diagnosis:  RV lead, defib impedance warning alarm  Secondary Discharge Diagnosis:  CAD Chronic CHF Compensated currently VT history LBBB HTN AFib 7.   Hypothyroid Likely 2/2 amiodarone  No Known Allergies   Procedures This Admission:  none  Brief HPI: Joseph Carson is a 66 y.o. male was referred to the ER 2/2 ICD alarm/toning   Hospital Course:  The patient saw Dr. Lovena Le 05/29/21, myalgias were much improved off amio and statin.  He did mention "note some noise on his atrial lead. We will follow. He has had a single spurious high voltage impedence and we will follow. Hopefully he will not need another ICD lead"  At [facility] yesterday the device started to tone again, and he was advised to come in.  He has felt well, c/o no symptoms, no CP, palpitations, SOB, DOE  LABS all look ok  Device checked Subsequent defib impedances are 90's-110's No arrhythmias Lead testing noted LV lead today stable with 1/4safety margin on this lead A lead (known) with noise produced by isometrics Max defib lead impedance alarm increased from >160 to  >200 Ohms   Dr. Lovena Le has seen and examined the patient, discussed potential management strategies with the patient.   Device system extraction with new system on the R side S-ICD in tandem with his CRT device for pacing Turn off defibrillation/tachy-therapies For now, will continue to monitor lead closely and provide him with a magnet to use should he start get get in appropriate shocks The patient was considered stable for discharge to home.  Losartan was added here, historically BP has been limiter to adding an ARB though looks OK here, will discharge with the losartan, the  patient will hold for SBP < 90 and let us know  Physical Exam: Vitals:   06/04/21 0500 06/04/21 0600 06/04/21 0610 06/04/21 0953  BP: 114/81 118/72  (!) 108/55  Pulse: (!) 58 (!) 59 (!) 58 63  Resp: 17 16 (!) 21 20  Temp:   (!) 97.2 F (36.2 C)   TempSrc:   Temporal   SpO2: 94% 91% 94% 97%     Labs:   Lab Results  Component Value Date   WBC 10.0 06/04/2021   HGB 15.8 06/04/2021   HCT 47.3 06/04/2021   MCV 91.3 06/04/2021   PLT 170 06/04/2021    Recent Labs  Lab 06/04/21 0445  NA 136  K 3.6  CL 104  CO2 24  BUN 19  CREATININE 1.09  CALCIUM 9.0  GLUCOSE 92    Discharge Medications:  Allergies as of 06/04/2021   No Known Allergies      Medication List     TAKE these medications    acetaminophen 325 MG tablet Commonly known as: TYLENOL Take 1-2 tablets (325-650 mg total) by mouth every 4 (four) hours as needed for mild pain.   carvedilol 6.25 MG tablet Commonly known as: COREG TAKE 2 TABLETS WITH FOOD TWICE DAILY What changed: See the new instructions.   furosemide 40 MG tablet Commonly known as: LASIX TAKE 1 TABLET BY MOUTH DAILY   HAIR/SKIN/NAILS PO Take 1 tablet by mouth daily.   levothyroxine 50 MCG tablet Commonly known as: Synthroid Take 1 tablet (50 mcg total) by mouth daily  before breakfast.   losartan 25 MG tablet Commonly known as: COZAAR Take 1 tablet (25 mg total) by mouth daily. Start taking on: June 05, 2021 Notes to patient: If your blood pressure gets low (below 90 on the top number, stop this medicine)   spironolactone 25 MG tablet Commonly known as: ALDACTONE TAKE 1 TABLET BY MOUTH DAILY.   warfarin 5 MG tablet Commonly known as: COUMADIN Take as directed. If you are unsure how to take this medication, talk to your nurse or doctor. Original instructions: TAKE 1/2 TO 1 TABLET BY MOUTH DAILY AS DIRECTED BY COUMADIN CLINIC What changed:  how much to take how to take this when to take this        Disposition:  Home Discharge Instructions     Diet - low sodium heart healthy   Complete by: As directed    Increase activity slowly   Complete by: As directed        Follow-up Information     Baldwin Jamaica, PA-C Follow up.   Specialty: Cardiology Why: 06/15/21 @ 10:55AM Contact information: [address] [place]  [address] [phone]                 Duration of Discharge Encounter: Greater than 30 minutes including physician time.  Venetia Night, PA-C 06/04/2021 12:46 PM  EP Attending  Patient seen and examined. Agree with the findings as noted above. The patient feels well. He presented after his ICD alarmed due to an elevated isolated shocking impedence. His shocking impedence is back to normal. We discussed the treatment options in detail including turning his shocking function off, undergoing another lead extraction, with a new companies lead, removal of the current system and placing a subcutaneous ICD. He will be discharged home. Followup in the office in a couple of weeks.  Carleene Overlie Dan Dissinger,MD

## 2021-06-05 ENCOUNTER — Telehealth (HOSPITAL_COMMUNITY): Payer: Self-pay | Admitting: Cardiology

## 2021-06-05 ENCOUNTER — Other Ambulatory Visit: Payer: Self-pay

## 2021-06-05 ENCOUNTER — Ambulatory Visit (INDEPENDENT_AMBULATORY_CARE_PROVIDER_SITE_OTHER): Payer: Medicare Other

## 2021-06-05 DIAGNOSIS — I513 Intracardiac thrombosis, not elsewhere classified: Secondary | ICD-10-CM

## 2021-06-05 DIAGNOSIS — Z5181 Encounter for therapeutic drug level monitoring: Secondary | ICD-10-CM | POA: Diagnosis not present

## 2021-06-05 LAB — POCT INR: INR: 1.8 — AB (ref 2.0–3.0)

## 2021-06-05 NOTE — Patient Instructions (Signed)
Description   TAKE 1.5 tablets tonight and 1.5 tablets tomorrow and then START taking  0.5 tablet daily except 1 tablet on Sunday, Monday, Wednesday, and Fridays.  Recheck INR in 1 week.  Call Coumadin Clinic if placed on any new medications or scheduled for any procedures - [phone];   Amio discontinued.

## 2021-06-15 ENCOUNTER — Encounter: Payer: Self-pay | Admitting: Physician Assistant

## 2021-06-15 ENCOUNTER — Other Ambulatory Visit: Payer: Self-pay

## 2021-06-15 ENCOUNTER — Encounter: Payer: Self-pay | Admitting: Internal Medicine

## 2021-06-15 ENCOUNTER — Ambulatory Visit (INDEPENDENT_AMBULATORY_CARE_PROVIDER_SITE_OTHER): Payer: Medicare Other | Admitting: Internal Medicine

## 2021-06-15 ENCOUNTER — Ambulatory Visit (INDEPENDENT_AMBULATORY_CARE_PROVIDER_SITE_OTHER): Payer: Medicare Other | Admitting: Physician Assistant

## 2021-06-15 VITALS — BP 100/62 | HR 52 | Ht 70.0 in | Wt 217.4 lb

## 2021-06-15 DIAGNOSIS — I255 Ischemic cardiomyopathy: Secondary | ICD-10-CM

## 2021-06-15 DIAGNOSIS — I5022 Chronic systolic (congestive) heart failure: Secondary | ICD-10-CM

## 2021-06-15 DIAGNOSIS — I472 Ventricular tachycardia, unspecified: Secondary | ICD-10-CM | POA: Diagnosis not present

## 2021-06-15 DIAGNOSIS — Z95810 Presence of automatic (implantable) cardiac defibrillator: Secondary | ICD-10-CM

## 2021-06-15 DIAGNOSIS — E039 Hypothyroidism, unspecified: Secondary | ICD-10-CM

## 2021-06-15 DIAGNOSIS — I447 Left bundle-branch block, unspecified: Secondary | ICD-10-CM | POA: Diagnosis not present

## 2021-06-15 DIAGNOSIS — I2510 Atherosclerotic heart disease of native coronary artery without angina pectoris: Secondary | ICD-10-CM

## 2021-06-15 LAB — CUP PACEART INCLINIC DEVICE CHECK
Battery Remaining Longevity: 28 mo
Battery Voltage: 2.94 V
Brady Statistic AP VP Percent: 83.93 %
Brady Statistic AP VS Percent: 0.26 %
Brady Statistic AS VP Percent: 15.01 %
Brady Statistic AS VS Percent: 0.81 %
Brady Statistic RA Percent Paced: 83.17 %
Brady Statistic RV Percent Paced: 98 %
Date Time Interrogation Session: 20230127183919
HighPow Impedance: 90 Ohm
Implantable Lead Implant Date: 20160414
Implantable Lead Implant Date: 20160414
Implantable Lead Implant Date: 20200327
Implantable Lead Location: 753858
Implantable Lead Location: 753859
Implantable Lead Location: 753860
Implantable Lead Model: 4398
Implantable Lead Model: 5076
Implantable Lead Model: 6935
Implantable Pulse Generator Implant Date: 20200327
Lead Channel Impedance Value: 226.417
Lead Channel Impedance Value: 226.417
Lead Channel Impedance Value: 230.327
Lead Channel Impedance Value: 247 Ohm
Lead Channel Impedance Value: 251.66 Ohm
Lead Channel Impedance Value: 361 Ohm
Lead Channel Impedance Value: 418 Ohm
Lead Channel Impedance Value: 475 Ohm
Lead Channel Impedance Value: 494 Ohm
Lead Channel Impedance Value: 494 Ohm
Lead Channel Impedance Value: 494 Ohm
Lead Channel Impedance Value: 513 Ohm
Lead Channel Impedance Value: 513 Ohm
Lead Channel Impedance Value: 760 Ohm
Lead Channel Impedance Value: 760 Ohm
Lead Channel Impedance Value: 760 Ohm
Lead Channel Impedance Value: 836 Ohm
Lead Channel Impedance Value: 836 Ohm
Lead Channel Pacing Threshold Amplitude: 0.625 V
Lead Channel Pacing Threshold Amplitude: 1 V
Lead Channel Pacing Threshold Amplitude: 2.75 V
Lead Channel Pacing Threshold Pulse Width: 0.4 ms
Lead Channel Pacing Threshold Pulse Width: 0.4 ms
Lead Channel Pacing Threshold Pulse Width: 0.8 ms
Lead Channel Sensing Intrinsic Amplitude: 0.75 mV
Lead Channel Sensing Intrinsic Amplitude: 1 mV
Lead Channel Sensing Intrinsic Amplitude: 17.125 mV
Lead Channel Sensing Intrinsic Amplitude: 22.25 mV
Lead Channel Setting Pacing Amplitude: 1.5 V
Lead Channel Setting Pacing Amplitude: 2.5 V
Lead Channel Setting Pacing Amplitude: 3 V
Lead Channel Setting Pacing Pulse Width: 0.4 ms
Lead Channel Setting Pacing Pulse Width: 0.8 ms
Lead Channel Setting Sensing Sensitivity: 0.3 mV

## 2021-06-15 MED ORDER — LOSARTAN POTASSIUM 25 MG PO TABS: 12.5000 mg | ORAL_TABLET | Freq: Every day | ORAL | 1 refills | Status: DC

## 2021-06-15 MED ORDER — CARVEDILOL 25 MG PO TABS: 25.0000 mg | ORAL_TABLET | Freq: Two times a day (BID) | ORAL | 1 refills | Status: DC

## 2021-06-15 NOTE — Patient Instructions (Addendum)
Medication Instructions:    START TAKING: COREG 25 MG TWICE A DAY   START TAKING: LOSARTAN 12.5 MG ONCE  A DAY   *If you need a refill on your cardiac medications before your next appointment, please call your pharmacy*   Lab Work:  NONE ORDERED  TODAY   If you have labs (blood work) drawn today and your tests are completely normal, you will receive your results only by: MyChart Message (if you have MyChart) OR A paper copy in the mail If you have any lab test that is abnormal or we need to change your treatment, we will call you to review the results.   Testing/Procedures: NONE ORDERED  TODAY   Follow-Up: At [facility], you and your health needs are our priority.  As part of our continuing mission to provide you with exceptional heart care, we have created designated Provider Care Teams.  These Care Teams include your primary Cardiologist (physician) and Advanced Practice Providers (APPs -  Physician Assistants and Nurse Practitioners) who all work together to provide you with the care you need, when you need it.  We recommend signing up for the patient portal called "MyChart".  Sign up information is provided on this After Visit Summary.  MyChart is used to connect with patients for Virtual Visits (Telemedicine).  Patients are able to view lab/test results, encounter notes, upcoming appointments, etc.  Non-urgent messages can be sent to your provider as well.   To learn more about what you can do with MyChart, go to [email or web address].    Your next appointment:   4 month(s)  The format for your next appointment:   In Person  Provider:   Francis Dowse, PA-C    Other Instructions

## 2021-06-15 NOTE — Progress Notes (Signed)
Cardiology Office Note Date:  06/15/2021  Patient ID:  Joseph, Carson Apr 01, 1956, MRN [id number] PCP:  [name], DO  Electrophysiologist: Dr. Lovena Le     Chief Complaint:  f/u on device alerts  History of Present Illness: Joseph Carson is a 66 y.o. male with history of VT, ICD, AFib, CAD (CABG 2014), chronic CHF (combined), HTN, HLD, LBBB, LV thrombus (2015).  He had a hospitalization 11/30/20 with multiple ICD shocks appropriate 2/2 VT/VF, recent/ongoing symptoms of cough/chills, feeling unwell. Admitted with VT/VF, COVID +, not felt to be volume OL Seen by EP, Dr. Quentin Ore, suspect COVID as one of the main drivers for recent functional decline and lead into his VT/VF, shocks. Tiksoyn was stopped and started amiodarone. Discharged 12/03/20  He had f/u with Dr. Lovena Le , mentioned noise on the A lead with plans to monitor, discussed appropriate therapies for VT, reminded of no driving, no changes were made  Device clinic note 03/15/21 for an alert 2/2 RV defib impedance,  >160 on 03/13/21.  lead impedence testing today reflected RV defib impedence of 96 ohms consistent with trends, no changes were made.  He had an ER visit 03/26/21 with progressive LE weakness, falls, neuroapthy sounding symptoms. In d/w myself recommended continue amioadrone, pending his neurology evaluation CK 166, B12 wnl Neuro saw him Discussed likely multifactorial with sedentary lifestyle and Recommended stopping lipitor and amiodarone If not improved further neuro eval recommended  After d/w dr. Lovena Le, cleared to stop amio and planned for EP follow up TSH quite high 30.704  03/30/21, T3 nml,T4 slightly low, LFTs were OK  I saw him Nov 2022 He comes today accompanied by his daughter He feels like he is in fact feeling better, less SOB and legs starting to feel stronger as well. He report that shortly after starting amiodarone he started feeling a bit breathless, this has been about  the same , his legs though fairly steadily in the last month or so have felt weak.  A couple weeks ago as he was getting out of bed noticed his legs felt strange/weak, but able to get his strength under him/ The day of his ER visit he had gone to the bathroom, his legs very weak and then fell He did not faint, was not lightheaded, his legs gave out They were so weak he was unable to get back up and drug himself back to his bedroom, finally able to get back in bed until the AM when his daught was awake she was able to help him and they went tot he ER. Otherwise, he has felt well No CP, palpitations or cardiac awareness No SOB No bleeding or signs of bleeding Off amio and statin, was staring to feel better Planned to see dr. Lovena Le in a couple months to revisit AAD choices/need.  He saw Dr. Lovena Le 05/29/21, He had a device alert (beeping note by thept) for elevated HV impedence, and known A lead noise. No changes were made, planned to monitor device function closely.  ER visit  06/03/21 for device beeping again, again had an elevated HV impedence, parameters adjusted from > 160 to >200 Ohms. No other acute medical issues noted and discharged from the ER, BP looked like it may tolerate low dose ARB at that time and losartan added.  TODAY He is doing very well No CP, palpitations No rest SOB, minimal DOE with increased exertion, like going up/down the stairs a few times. No near syncope or syncope. He saw his  PMD yesterday with plans to repeat TSH/thyroid testing in a few weeks He has not heard his device alert since the ER  Device information MDT CRT-D implanted 10/14/2018 RA and CS leads are from  09/01/2014 Original RV lead extracted 2/2 noise, 10/14/2018  AAD Hx Tikosyn stopped >>  Amiodarone started  >> stopped Nov 2022 2/2 myalgias   Past Medical History:  Diagnosis Date   AICD (automatic cardioverter/defibrillator) present    Atrial fibrillation, chronic (HCC)    CAD (coronary  artery disease)    a. s/p CABG x 3 03/2013 (L-LAD, S-RI, S-PDA) in [place]   CAP (community acquired pneumonia) 08/22/2015   CHF (congestive heart failure) ([place])    Chronic combined systolic and diastolic heart failure ([place]) November 2014   Coronary artery disease    Full dentures    HLD (hyperlipidemia)     Hyperbilirubinemia    Ischemic cardiomyopathy    a. echo (10/15):  EF 20%, diff HK, apical AK, mobile density inf wall near papillary muscle (?ruptured) with mod MR, mod reduced RVSF, mild PI, PASP 37 mmHg b. s/p CRTD    LBBB (left bundle branch block)     LV (left ventricular) mural thrombus     a. TEE (10/15):  EF 15-20%, septum and ant walls AK, lat wall HK, no papillary muscle rupture, probable small thrombus on ant/ant-septal wall, mild MR, mod decreased RVSF, neg bubble study   Mitral regurgitation     mild by TEE 02/2014   NSVT (nonsustained ventricular tachycardia)         Obesity    Pulmonary hypertension ([place])    Umbilical hernia    "have had it since age [id number]"   Umbilical hernia    Wears glasses     Past Surgical History:  Procedure Laterality Date   BI-VENTRICULAR IMPLANTABLE CARDIOVERTER DEFIBRILLATOR N/A 09/01/2014   MDT CRTD implanted by Dr Lovena Le   CARDIAC CATHETERIZATION  03/2013   CARDIOVERSION N/A 12/04/2017   Procedure: CARDIOVERSION;  Surgeon: Evans Lance, MD;  Location: [facility];  Service: Cardiovascular;  Laterality: N/A;   CORONARY ARTERY BYPASS GRAFT  03/2013   LIMA to the LAD, SVG to the ramus, SVG to the PDA   CORONARY ARTERY BYPASS GRAFT  2014   CABG "X3"   FRACTURE SURGERY     ICD LEAD REMOVAL N/A 08/14/2018   Procedure: ICD LEAD REMOVAL;  Surgeon: Evans Lance, MD;  Location: [facility];  Service: Cardiovascular;  Laterality: N/A;   INSERT / REPLACE / REMOVE PACEMAKER  2016   LEAD INSERTION N/A 08/14/2018   Procedure: LEAD INSERTION;  Surgeon: Evans Lance, MD;  Location: [place];  Service: Cardiovascular;  Laterality: N/A;   LEAD  REVISION/REPAIR N/A 12/04/2017   Procedure: LEAD REVISION/REPAIR;  Surgeon: Evans Lance, MD;  Location: [place] [facility];  Service: Cardiovascular;  Laterality: N/A;   MULTIPLE TOOTH EXTRACTIONS     PATELLA FRACTURE SURGERY Left 1972   TEE WITHOUT CARDIOVERSION N/A 03/04/2014   Procedure: TRANSESOPHAGEAL ECHOCARDIOGRAM (TEE);  Surgeon: Larey Dresser, MD;  Location: [place];  Service: Cardiovascular;  Laterality: N/A;   TEE WITHOUT CARDIOVERSION N/A 12/02/2017   Procedure: TRANSESOPHAGEAL ECHOCARDIOGRAM (TEE);  Surgeon: Buford Dresser, MD;  [facility];  Service: Cardiovascular;  Laterality: N/A;    Current Outpatient Medications  Medication Sig Dispense Refill   acetaminophen (TYLENOL) 325 MG tablet Take 1-2 tablets (325-650 mg total) by mouth every 4 (four) hours as needed for mild  pain. (Patient not taking: Reported on 06/04/2021)     carvedilol (COREG) 6.25 MG tablet TAKE 2 TABLETS WITH FOOD TWICE DAILY (Patient taking differently: Take 6.25 mg by mouth 2 (two) times daily with a meal.) 360 tablet 3   furosemide (LASIX) 40 MG tablet TAKE 1 TABLET BY MOUTH DAILY (Patient taking differently: Take 40 mg by mouth daily.) 90 tablet 3   levothyroxine (SYNTHROID) 50 MCG tablet Take 1 tablet (50 mcg total) by mouth daily before breakfast. 90 tablet 1   losartan (COZAAR) 25 MG tablet Take 1 tablet (25 mg total) by mouth daily. 30 tablet 5   Multiple Vitamins-Minerals (HAIR/SKIN/NAILS PO) Take 1 tablet by mouth daily.     spironolactone (ALDACTONE) 25 MG tablet TAKE 1 TABLET BY MOUTH DAILY. (Patient taking differently: Take 25 mg by mouth daily.) 90 tablet 3   warfarin (COUMADIN) 5 MG tablet TAKE 1/2 TO 1 TABLET BY MOUTH DAILY AS DIRECTED BY COUMADIN CLINIC (Patient taking differently: Take 5 mg by mouth daily at 4 PM. TAKE 1/2 TO 1 TABLET BY MOUTH DAILY AS DIRECTED BY COUMADIN CLINIC) 90 tablet 1   No current facility-administered medications for this visit.     Allergies:   Patient has no known allergies.   Social History:  The patient  reports that he has been smoking cigarettes. He has a 11.75 pack-year smoking history. He has never used smokeless tobacco. He reports that he does not currently use alcohol. He reports current drug use. Drug: Marijuana.   Family History:  The patient's family history includes Diabetes in his mother; Thyroid disease in his mother.  ROS:  Please see the history of present illness.    All other systems are reviewed and otherwise negative.   PHYSICAL EXAM:  VS:  There were no vitals taken for this visit. BMI: There is no height or weight on file to calculate BMI. Well nourished, well developed, in no acute distress HEENT: normocephalic, atraumatic Neck: no JVD, carotid bruits or masses Cardiac:  RRR; extrasystoles no significant murmurs, no rubs, or gallops Lungs:  CTA b/l, no wheezing, rhonchi or rales Abd: soft, nontender MS: no deformity or atrophy Ext: no edema Skin: warm and dry, no rash Neuro:  No gross deficits appreciated Psych: euthymic mood, full affect  ICD site is stable, no tethering or discomfort   EKG:  done today and reviewed by myself AV paced, 61bpm, PVCs  Device interrogation done today and reviewed by myself:  Battery is good LV lead thresholds by auto threshold was 2.750/0.8 and output at 2.5/0.8  Threshold today is 2.0/0.8 and 2.0/1.0 as well LV output incresed to 3.0V/0.66ms to ensure LV capture Shock impedance has been stable, measured 3xs today all were 90 Ohms Otherwise lead measurements looked good No arrhythmias VP 98% Effective 85.8%   12/02/2020: TTE IMPRESSIONS   1. Left ventricular ejection fraction, by estimation, is <20%. The left  ventricle has severely decreased function. The left ventricle demonstrates  global hypokinesis. The left ventricular internal cavity size was severely  dilated. Left ventricular  diastolic parameters are consistent with Grade I  diastolic dysfunction  (impaired relaxation).   2. Right ventricular systolic function is normal. The right ventricular  size is normal. There is normal pulmonary artery systolic pressure. The  estimated right ventricular systolic pressure is [id number] mmHg.   3. Left atrial size was moderately dilated.   4. Right atrial size was mildly dilated.   5. The mitral valve is normal in structure. Mild mitral  valve  regurgitation. No evidence of mitral stenosis.   6. The aortic valve is normal in structure. Aortic valve regurgitation is  not visualized. No aortic stenosis is present.   7. The inferior vena cava is normal in size with greater than 50%  respiratory variability, suggesting right atrial pressure of 3 mmHg.    Recent Labs: 03/26/2021: Magnesium 1.9 03/30/2021: ALT 11 05/29/2021: TSH 33.500 06/04/2021: BUN 19; Creatinine, Ser 1.09; Hemoglobin 15.8; Platelets 170; Potassium 3.6; Sodium 136  12/14/2020: Chol/HDL Ratio 2.9; Cholesterol, Total 114; HDL 40; LDL Chol Calc (NIH) 56; Triglycerides 94   CrCl cannot be calculated (Unknown ideal weight.).   Wt Readings from Last 3 Encounters:  05/29/21 216 lb (98 kg)  04/03/21 210 lb 9.6 oz (95.5 kg)  03/26/21 220 lb (99.8 kg)     Other studies reviewed: Additional studies/records reviewed today include: summarized above  ASSESSMENT AND PLAN:  ICD Intact function Programming changes as above  He has had some HV impedance alerts, since adjusting alert trigger to >200 Ohms, has not had further  Low effective pacing, suspect he may have been not having consistant LV lead capture, he also is having frequent PVCs  VT Admitted July 2022 with multiple appropriate shocks, started on amiodarone Amio stopped Nov 2022 2/2 myalgias (lipitor also stopped) TSH up markedly, T2,T4 ok Started on Synthroid PMD is following  Increase coreg to 25mg  BID and reduce losartan to 12.5mg  daily   CAD No anginal symptoms No ASA w/warfarin Off statin  w/severe myalgias (also was on amio)  Chronic CHF (systolic) ICM No symptoms or exam findings of volume OL OptiVol looks good  Paroxysmal Afib CHA2DS2Vasc is 6, on warfarin 0 % burden Off tiksoyn July 2022 in setting of VT/VF Off amiodarone Nov 2022 2/2 myalgias No AAD currently   HTN Looks ok     Disposition: remotes as usual, in clinic in 3-2mo, sooner if needed   Current medicines are reviewed at length with the patient today.  The patient did not have any concerns regarding medicines.  Venetia Night, PA-C 06/15/2021 4:40 AM     [facility] [address] [place] [address] [phone] (office)  [phone] (fax)

## 2021-06-15 NOTE — Assessment & Plan Note (Addendum)
Patient presents to the office after a TSH came back at 33 on 05/29/21. Patient states he has never been on thyroid medication in the past, and just started Synthroid 50 mcg daily 2 weeks ago. His only symptoms are fatigue.   A/P:  Patient presents with a recent TSH of 33 and started on levothyroxine 50 mcg daily. On chart review he did have a depressed fT4 on 03/30/21 of 0.79. We did discuss that it is too early for the patient to have labs drawn since starting therapy. We typically check levels in 6 weeks after starting medication. Joseph Carson is agreeable to another lab visit in 4 weeks.  - TSH level in 4 weeks - OV in 4 weeks

## 2021-06-15 NOTE — Progress Notes (Signed)
CC: Thyroid Follow Up  HPI:  Mr.Isac Jarvis Sawa is a 66 y.o. person, with a PMH noted below, who presents to the clinic for abnormal thyroid testing. To see the management of their acute and chronic conditions, please see the A&P note under the Encounters tab.   Past Medical History:  Diagnosis Date   AICD (automatic cardioverter/defibrillator) present    Atrial fibrillation, chronic (HCC)    CAD (coronary artery disease)    a. s/p CABG x 3 03/2013 (L-LAD, S-RI, S-PDA) in [place]   CAP (community acquired pneumonia) 08/22/2015   CHF (congestive heart failure) (HCC)    Chronic combined systolic and diastolic heart failure (HCC) November 2014   Coronary artery disease    Full dentures    HLD (hyperlipidemia)     Hyperbilirubinemia    Ischemic cardiomyopathy    a. echo (10/15):  EF 20%, diff HK, apical AK, mobile density inf wall near papillary muscle (?ruptured) with mod MR, mod reduced RVSF, mild PI, PASP 37 mmHg b. s/p CRTD    LBBB (left bundle branch block)     LV (left ventricular) mural thrombus     a. TEE (10/15):  EF 15-20%, septum and ant walls AK, lat wall HK, no papillary muscle rupture, probable small thrombus on ant/ant-septal wall, mild MR, mod decreased RVSF, neg bubble study   Mitral regurgitation     mild by TEE 02/2014   NSVT (nonsustained ventricular tachycardia)         Obesity    Pulmonary hypertension (HCC)    Umbilical hernia    "have had it since age [age]"   Umbilical hernia    Wears glasses    Review of Systems:   Review of Systems  Constitutional:  Positive for malaise/fatigue. Negative for chills, diaphoresis and weight loss.  Eyes:  Negative for blurred vision.  Respiratory:  Negative for cough.   Cardiovascular:  Negative for chest pain and palpitations.  Gastrointestinal:  Negative for abdominal pain, constipation, diarrhea, nausea and vomiting.  Musculoskeletal:  Negative for myalgias.  Neurological:  Negative for dizziness and headaches.     Physical Exam:  Vitals:   06/15/21 0924  BP: 111/68  Pulse: (!) 59  Temp: 98 F (36.7 C)  TempSrc: Oral  SpO2: 97%  Weight: 219 lb 4.8 oz (99.5 kg)   Physical Exam Vitals reviewed.  Constitutional:      General: He is not in acute distress.    Appearance: Normal appearance. He is not ill-appearing or toxic-appearing.  HENT:     Head: Normocephalic and atraumatic.  Cardiovascular:     Rate and Rhythm: Normal rate and regular rhythm.     Pulses: Normal pulses.     Heart sounds: Normal heart sounds. No murmur heard.   No friction rub. No gallop.  Pulmonary:     Effort: Pulmonary effort is normal.     Breath sounds: Normal breath sounds. No wheezing, rhonchi or rales.  Abdominal:     General: Bowel sounds are normal.     Palpations: Abdomen is soft.     Tenderness: There is no abdominal tenderness. There is no guarding.  Musculoskeletal:        General: No swelling.     Right lower leg: No edema.     Left lower leg: No edema.  Skin:    General: Skin is warm and dry.  Neurological:     Mental Status: He is alert and oriented to person, place, and time.  Psychiatric:  Mood and Affect: Mood normal.        Behavior: Behavior normal.     Assessment & Plan:   See Encounters Tab for problem based charting.  Patient discussed with Dr.  Lafonda Mosses

## 2021-06-15 NOTE — Patient Instructions (Addendum)
Mr. Morimoto,   It was a pleasure seeing you today! Today we discussed your recent thyroid medication, please continue taking your Synthroid daily, and we will follow up your labs in 4 weeks to see if we need to make any adjustments at that time.  Have a good day,  Dolan Amen, MD

## 2021-06-18 NOTE — Progress Notes (Signed)
Internal Medicine Clinic Attending ? ?Case discussed with Dr. Winters  At the time of the visit.  We reviewed the resident?s history and exam and pertinent patient test results.  I agree with the assessment, diagnosis, and plan of care documented in the resident?s note.  ?

## 2021-06-19 ENCOUNTER — Other Ambulatory Visit: Payer: Self-pay

## 2021-06-19 ENCOUNTER — Ambulatory Visit (INDEPENDENT_AMBULATORY_CARE_PROVIDER_SITE_OTHER): Payer: Medicare Other

## 2021-06-19 DIAGNOSIS — Z5181 Encounter for therapeutic drug level monitoring: Secondary | ICD-10-CM

## 2021-06-19 LAB — POCT INR: INR: 3.2 — AB (ref 2.0–3.0)

## 2021-06-19 NOTE — Patient Instructions (Signed)
Description   Hold today's dose and then continue taking  0.5 tablet daily except 1 tablet on Sunday, Monday, Wednesday, and Fridays.   Recheck INR in 3 weeks. Call Coumadin Clinic if placed on any new medications or scheduled for any procedures - [phone];   Amio discontinued.

## 2021-06-26 ENCOUNTER — Telehealth: Payer: Self-pay | Admitting: Internal Medicine

## 2021-06-26 NOTE — Telephone Encounter (Signed)
Pt c/o of Chest Pain: STAT if CP now or developed within 24 hours  1. Are you having CP right now? Was a little bit ago but not next to him right now  2. Are you experiencing any other symptoms (ex. SOB, nausea, vomiting, sweating)? Sweating, chills  3. How long have you been experiencing CP? Started maybe Sunday night or yesterday morning,   4. Is your CP continuous or coming and going? Comes and goes  5. Have you taken Nitroglycerin? no   Patient's daughter states the patient has been having pain in his upper chest. She says he is also very, very confused and does not know where he is or what day it is. She says he has been sweating and having diarrhea and chills. She says his medications were just adjusted and he started a thyroid medication and losartan. She states the patient says the pain is not around his heart and is not sure if it is his lungs. She says he has also taken  "a ton of tylenol" and has tested negative for covid. She states she is not next to the patient now, but is on the same property. She says she has a building by the house she works at.   ?

## 2021-06-26 NOTE — Telephone Encounter (Signed)
Spoke with pt's daughter, Misty Stanley, [place] who reports pt developed chest pain on Saturday.  In addition to the CP he is having profuse sweating and chills without fever.  She also reports pt is confused and not oriented to date or time.  He is not eating well and has developed diarrhea.  Pt has been taking tylenol without relief. Pt's daughter advised due to pt's symptoms recommend she contact 911 now for further evaluation and transport to the ED.  Pt's daughter verbalizes understanding and agrees with current plan.

## 2021-06-27 ENCOUNTER — Emergency Department (HOSPITAL_COMMUNITY): Payer: Medicare Other

## 2021-06-27 ENCOUNTER — Inpatient Hospital Stay (HOSPITAL_COMMUNITY)
Admission: EM | Admit: 2021-06-27 | Discharge: 2021-06-30 | DRG: 194 | Disposition: A | Discharge status: Home / Self Care | Payer: Medicare Other | Attending: Internal Medicine | Admitting: Internal Medicine

## 2021-06-27 ENCOUNTER — Other Ambulatory Visit: Payer: Self-pay

## 2021-06-27 ENCOUNTER — Encounter (HOSPITAL_COMMUNITY): Payer: Self-pay | Admitting: Emergency Medicine

## 2021-06-27 ENCOUNTER — Telehealth: Payer: Self-pay | Admitting: Physician Assistant

## 2021-06-27 DIAGNOSIS — I5022 Chronic systolic (congestive) heart failure: Secondary | ICD-10-CM | POA: Diagnosis present

## 2021-06-27 DIAGNOSIS — I447 Left bundle-branch block, unspecified: Secondary | ICD-10-CM | POA: Diagnosis present

## 2021-06-27 DIAGNOSIS — F17210 Nicotine dependence, cigarettes, uncomplicated: Secondary | ICD-10-CM | POA: Diagnosis present

## 2021-06-27 DIAGNOSIS — E860 Dehydration: Secondary | ICD-10-CM | POA: Diagnosis present

## 2021-06-27 DIAGNOSIS — Z95810 Presence of automatic (implantable) cardiac defibrillator: Secondary | ICD-10-CM | POA: Diagnosis not present

## 2021-06-27 DIAGNOSIS — Z951 Presence of aortocoronary bypass graft: Secondary | ICD-10-CM

## 2021-06-27 DIAGNOSIS — J189 Pneumonia, unspecified organism: Secondary | ICD-10-CM | POA: Diagnosis present

## 2021-06-27 DIAGNOSIS — T45515A Adverse effect of anticoagulants, initial encounter: Secondary | ICD-10-CM | POA: Diagnosis present

## 2021-06-27 DIAGNOSIS — Z8616 Personal history of COVID-19: Secondary | ICD-10-CM

## 2021-06-27 DIAGNOSIS — D6832 Hemorrhagic disorder due to extrinsic circulating anticoagulants: Secondary | ICD-10-CM | POA: Diagnosis present

## 2021-06-27 DIAGNOSIS — Z79899 Other long term (current) drug therapy: Secondary | ICD-10-CM

## 2021-06-27 DIAGNOSIS — T462X5A Adverse effect of other antidysrhythmic drugs, initial encounter: Secondary | ICD-10-CM | POA: Diagnosis present

## 2021-06-27 DIAGNOSIS — I272 Pulmonary hypertension, unspecified: Secondary | ICD-10-CM | POA: Diagnosis present

## 2021-06-27 DIAGNOSIS — E032 Hypothyroidism due to medicaments and other exogenous substances: Secondary | ICD-10-CM | POA: Diagnosis present

## 2021-06-27 DIAGNOSIS — N179 Acute kidney failure, unspecified: Secondary | ICD-10-CM | POA: Diagnosis present

## 2021-06-27 DIAGNOSIS — E669 Obesity, unspecified: Secondary | ICD-10-CM | POA: Diagnosis present

## 2021-06-27 DIAGNOSIS — T82110A Breakdown (mechanical) of cardiac electrode, initial encounter: Secondary | ICD-10-CM | POA: Diagnosis present

## 2021-06-27 DIAGNOSIS — R778 Other specified abnormalities of plasma proteins: Secondary | ICD-10-CM | POA: Diagnosis present

## 2021-06-27 DIAGNOSIS — I110 Hypertensive heart disease with heart failure: Secondary | ICD-10-CM | POA: Diagnosis present

## 2021-06-27 DIAGNOSIS — R791 Abnormal coagulation profile: Secondary | ICD-10-CM | POA: Diagnosis present

## 2021-06-27 DIAGNOSIS — I4819 Other persistent atrial fibrillation: Secondary | ICD-10-CM | POA: Diagnosis present

## 2021-06-27 DIAGNOSIS — I480 Paroxysmal atrial fibrillation: Secondary | ICD-10-CM | POA: Diagnosis present

## 2021-06-27 DIAGNOSIS — I959 Hypotension, unspecified: Secondary | ICD-10-CM | POA: Diagnosis present

## 2021-06-27 DIAGNOSIS — R001 Bradycardia, unspecified: Secondary | ICD-10-CM | POA: Diagnosis present

## 2021-06-27 DIAGNOSIS — R531 Weakness: Secondary | ICD-10-CM

## 2021-06-27 DIAGNOSIS — Z7901 Long term (current) use of anticoagulants: Secondary | ICD-10-CM

## 2021-06-27 DIAGNOSIS — I255 Ischemic cardiomyopathy: Secondary | ICD-10-CM | POA: Diagnosis present

## 2021-06-27 DIAGNOSIS — Y712 Prosthetic and other implants, materials and accessory cardiovascular devices associated with adverse incidents: Secondary | ICD-10-CM | POA: Diagnosis present

## 2021-06-27 DIAGNOSIS — Z79890 Hormone replacement therapy (postmenopausal): Secondary | ICD-10-CM

## 2021-06-27 DIAGNOSIS — Z20822 Contact with and (suspected) exposure to covid-19: Secondary | ICD-10-CM | POA: Diagnosis present

## 2021-06-27 DIAGNOSIS — Z8349 Family history of other endocrine, nutritional and metabolic diseases: Secondary | ICD-10-CM

## 2021-06-27 DIAGNOSIS — I5042 Chronic combined systolic (congestive) and diastolic (congestive) heart failure: Secondary | ICD-10-CM | POA: Diagnosis present

## 2021-06-27 DIAGNOSIS — Z6832 Body mass index (BMI) 32.0-32.9, adult: Secondary | ICD-10-CM

## 2021-06-27 DIAGNOSIS — I340 Nonrheumatic mitral (valve) insufficiency: Secondary | ICD-10-CM | POA: Diagnosis present

## 2021-06-27 DIAGNOSIS — I2510 Atherosclerotic heart disease of native coronary artery without angina pectoris: Secondary | ICD-10-CM | POA: Diagnosis present

## 2021-06-27 DIAGNOSIS — E785 Hyperlipidemia, unspecified: Secondary | ICD-10-CM | POA: Diagnosis present

## 2021-06-27 LAB — TROPONIN I (HIGH SENSITIVITY)
Troponin I (High Sensitivity): 89 ng/L — ABNORMAL HIGH (ref <18)
Troponin I (High Sensitivity): 20 ng/L — ABNORMAL HIGH (ref <18)
Troponin I (High Sensitivity): 23 ng/L — ABNORMAL HIGH (ref <18)

## 2021-06-27 LAB — URINALYSIS, ROUTINE W REFLEX MICROSCOPIC
Bilirubin Urine: NEGATIVE
Glucose, UA: NEGATIVE mg/dL
Ketones, ur: NEGATIVE mg/dL
Leukocytes,Ua: NEGATIVE
Nitrite: NEGATIVE
Protein, ur: NEGATIVE mg/dL
Specific Gravity, Urine: 1.015 (ref 1.005–1.030)
pH: 5 (ref 5.0–8.0)

## 2021-06-27 LAB — COMPREHENSIVE METABOLIC PANEL
ALT: 13 U/L (ref 0–44)
AST: 19 U/L (ref 15–41)
Albumin: 3.1 g/dL — ABNORMAL LOW (ref 3.5–5.0)
Alkaline Phosphatase: 67 U/L (ref 38–126)
Anion gap: 11 (ref 5–15)
BUN: 23 mg/dL (ref 8–23)
CO2: 25 mmol/L (ref 22–32)
Calcium: 9.2 mg/dL (ref 8.9–10.3)
Chloride: 100 mmol/L (ref 98–111)
Creatinine, Ser: 1.45 mg/dL — ABNORMAL HIGH (ref 0.61–1.24)
GFR, Estimated: 53 mL/min — ABNORMAL LOW (ref >60)
Glucose, Bld: 151 mg/dL — ABNORMAL HIGH (ref 70–99)
Potassium: 3.5 mmol/L (ref 3.5–5.1)
Sodium: 136 mmol/L (ref 135–145)
Total Bilirubin: 0.8 mg/dL (ref 0.3–1.2)
Total Protein: 7.4 g/dL (ref 6.5–8.1)

## 2021-06-27 LAB — RESP PANEL BY RT-PCR (FLU A&B, COVID) ARPGX2
Influenza A by PCR: NEGATIVE
Influenza B by PCR: NEGATIVE
SARS Coronavirus 2 by RT PCR: NEGATIVE

## 2021-06-27 LAB — CBC
HCT: 44.7 % (ref 39.0–52.0)
Hemoglobin: 14.8 g/dL (ref 13.0–17.0)
MCH: 30.6 pg (ref 26.0–34.0)
MCHC: 33.1 g/dL (ref 30.0–36.0)
MCV: 92.4 fL (ref 80.0–100.0)
Platelets: 202 10*3/uL (ref 150–400)
RBC: 4.84 MIL/uL (ref 4.22–5.81)
RDW: 13.7 % (ref 11.5–15.5)
WBC: 12.3 10*3/uL — ABNORMAL HIGH (ref 4.0–10.5)
nRBC: 0 % (ref 0.0–0.2)

## 2021-06-27 LAB — TSH: TSH: 11.919 u[IU]/mL — ABNORMAL HIGH (ref 0.350–4.500)

## 2021-06-27 LAB — CBG MONITORING, ED: Glucose-Capillary: 75 mg/dL (ref 70–99)

## 2021-06-27 MED ORDER — LACTATED RINGERS IV BOLUS
500.0000 mL | Freq: Once | INTRAVENOUS | Status: AC
  Administered 2021-06-27: 500 mL via INTRAVENOUS

## 2021-06-27 MED ORDER — ACETAMINOPHEN 650 MG RE SUPP: 650.0000 mg | Freq: Four times a day (QID) | RECTAL | Status: DC | PRN

## 2021-06-27 MED ORDER — SODIUM CHLORIDE 0.9 % IV SOLN
1.0000 g | Freq: Once | INTRAVENOUS | Status: AC
  Administered 2021-06-27: 1 g via INTRAVENOUS
  Filled 2021-06-27: qty 10

## 2021-06-27 MED ORDER — LEVOTHYROXINE SODIUM 50 MCG PO TABS
50.0000 ug | ORAL_TABLET | Freq: Every day | ORAL | Status: DC
  Administered 2021-06-28 – 2021-06-30 (×3): 50 ug via ORAL
  Filled 2021-06-27 (×3): qty 1

## 2021-06-27 MED ORDER — ACETAMINOPHEN 325 MG PO TABS: 650.0000 mg | ORAL_TABLET | Freq: Four times a day (QID) | ORAL | Status: DC | PRN

## 2021-06-27 MED ORDER — IPRATROPIUM-ALBUTEROL 0.5-2.5 (3) MG/3ML IN SOLN: 3.0000 mL | RESPIRATORY_TRACT | Status: DC | PRN

## 2021-06-27 MED ORDER — SODIUM CHLORIDE 0.9 % IV SOLN
500.0000 mg | Freq: Once | INTRAVENOUS | Status: AC
  Administered 2021-06-27: 500 mg via INTRAVENOUS
  Filled 2021-06-27: qty 5

## 2021-06-27 NOTE — Hospital Course (Addendum)
On Thursday he was unable to sit up in the bed, feeling like he did not have any muscle strength; reports some episodes of urinary incontinence  Productive cough, labored breathing; dyspnea mainly when moving around  Had right sided chest pain previously, worsens with exertion (none currently)  Have had some chills, no fever, BP has been doing well  Congestion  One episode of diarrhea   Haven't eaten much, less of an appetite  Leg weakness: first episode, fell down and had no feeling in his legs - not sure when (weeks - months ago??) - after d/c'd a medication (unsure which?). Only one time occurrence.   Intermittent leg weakness  No dizziness or lightheadedness. No LOC.   No nausea or vomiting. No missed medications, took them last this AM.  Lives in [place] with daughter and two grandchildren, feels like he has good support. He is able to manage his medications. Self sufficent at home.  Cigarette use, 4-5 daily. No EtOH use current or past. Some marijuana use.   No inhalers, weight usually ~210lb.  Daughter, Last week, wed-thurs, not feeling good, loss of appetite, sweating, shirt soaked, chills, no fever, SOB, confusion about time, in and out confusion, wheezing, fall in the bathroom Nov 5th brought in nov 7th,

## 2021-06-27 NOTE — Progress Notes (Incomplete)
ANTICOAGULATION CONSULT NOTE - Initial Consult  Pharmacy Consult for Warfarin Indication: {Indications:3041533}  No Known Allergies  Patient Measurements:   Heparin Dosing Weight: ***  Vital Signs: Temp: 97.9 F (36.6 C) (02/08 1611) BP: 127/54 (02/08 1930) Pulse Rate: 62 (02/08 1930)  Labs: Recent Labs    06/27/21 0819 06/27/21 1321 06/27/21 1830  HGB  --  14.8  --   HCT  --  44.7  --   PLT  --  202  --   CREATININE 1.45*  --   --   TROPONINIHS 89* 20* 23*    Estimated Creatinine Clearance: 59.8 mL/min (A) (by C-G formula based on SCr of 1.45 mg/dL (H)).   Medical History: Past Medical History:  Diagnosis Date   AICD (automatic cardioverter/defibrillator) present    Atrial fibrillation, chronic (HCC)    CAD (coronary artery disease)    a. s/p CABG x 3 03/2013 (L-LAD, S-RI, S-PDA) in [place]   CAP (community acquired pneumonia) 08/22/2015   CHF (congestive heart failure) (HCC)    Chronic combined systolic and diastolic heart failure ([place]) November 2014   Coronary artery disease    Full dentures    HLD (hyperlipidemia)     Hyperbilirubinemia    Ischemic cardiomyopathy    a. echo (10/15):  EF 20%, diff HK, apical AK, mobile density inf wall near papillary muscle (?ruptured) with mod MR, mod reduced RVSF, mild PI, PASP 37 mmHg b. s/p CRTD    LBBB (left bundle branch block)     LV (left ventricular) mural thrombus     a. TEE (10/15):  EF 15-20%, septum and ant walls AK, lat wall HK, no papillary muscle rupture, probable small thrombus on ant/ant-septal wall, mild MR, mod decreased RVSF, neg bubble study   Mitral regurgitation     mild by TEE 02/2014   NSVT (nonsustained ventricular tachycardia)         Obesity    Pulmonary hypertension ([place])    Umbilical hernia    "have had it since age [id number]"   Umbilical hernia    Wears glasses     Medications:  (Not in a hospital admission)  Scheduled:  Infusions:   azithromycin 500 mg (06/27/21 2139)   PRN:  acetaminophen **OR** acetaminophen  Assessment: 28 yom with a history of ***. Patient presenting with ***. Warfarin per pharmacy consult placed for ***.   Patient taking warfarin prior to arrival. Home dose  5mg  tablet by mouth on Tuesday, Wednesday, Thursday and Saturdays per patient. Then take 2.5 mg rest of days. Last taken ***2/7 per patient.  PT/INR ***/*** Hgb ***; plt ***  Goal of Therapy:  [id number] {Monitor platelets by anticoagulation protocol:3041561::"Monitor platelets by anticoagulation protocol: Yes"}   Plan:  ***  Lorelei Pont, PharmD, BCPS 06/27/2021 9:45 PM ED Clinical Pharmacist -  [phone]

## 2021-06-27 NOTE — ED Notes (Signed)
Per pt's daughter, pt has been confused and was having SOBx6 days. Pt c/o right sided chest painx3 days. Pt has audible wheezes.

## 2021-06-27 NOTE — ED Provider Triage Note (Signed)
Emergency Medicine Provider Triage Evaluation Note  Joseph Carson , a 66 y.o. male  was evaluated in triage.  Pt complains of 5 days of weakness.  Patient has history of pacemaker and combined heart failure.  He has had generalized weakness, cough, diarrhea over this time.  He has had some difficulty walking at times due to generalized weakness.  He has had chills but no fevers.  No URI symptoms.  Took a home COVID test that was negative.  Review of Systems  Positive: Chills, diarrhea, cough, weakness Negative: Fever  Physical Exam  BP (!) 129/99 (BP Location: Left Arm)    Pulse (!) 50    Temp 97.7 F (36.5 C) (Oral)    Resp 20    SpO2 96%  Gen:   Awake, no distress   Resp:  Normal effort  MSK:   Moves extremities without difficulty  Other:  Abdomen with reducible hernia, no tenderness  Medical Decision Making  Medically screening exam initiated at 8:13 AM.  Appropriate orders placed.  Stann Ore was informed that the remainder of the evaluation will be completed by another provider, this initial triage assessment does not replace that evaluation, and the importance of remaining in the ED until their evaluation is complete.     Renne Crigler, PA-C 06/27/21 [phone]

## 2021-06-27 NOTE — ED Provider Notes (Signed)
[name] EMERGENCY DEPARTMENT Provider Note   [name] Arrival date & time: 06/27/21  0759     History  Chief Complaint  Patient presents with   Weakness    Joseph Carson is a 66 y.o. male.   Weakness Associated symptoms: chest pain, cough and shortness of breath   Associated symptoms: no abdominal pain   Patient presents with confusion and generalized weakness.  Had some right-sided chest pain.  Starting on Saturday felt bad.  Today is Wednesday.  States that he had some right-sided chest pain.  Worse when he moved.  No exertional pain.  States he was having difficulty getting out of bed.  States both legs felt weak on Saturday.  States he urinated on himself but has not done that since.  States he did have a little bit of dribbling but that is not new for him.  States he had a cough with some mild sputum production.  Not on oxygen at home.  Is had some wheezing.  Has had issues with his thyroid since being on amiodarone.  Is on Synthroid at 50 mcg for now.    Home Medications Prior to Admission medications   Medication Sig Start Date End Date Taking? Authorizing Provider  acetaminophen (TYLENOL) 325 MG tablet Take 1-2 tablets (325-650 mg total) by mouth every 4 (four) hours as needed for mild pain. 09/02/14   Isaiah Serge, NP  carvedilol (COREG) 25 MG tablet Take 1 tablet (25 mg total) by mouth 2 (two) times daily with a meal. 06/15/21   Baldwin Jamaica, PA-C  furosemide (LASIX) 40 MG tablet TAKE 1 TABLET BY MOUTH DAILY 01/16/21   Evans Lance, MD  levothyroxine (SYNTHROID) 50 MCG tablet Take 1 tablet (50 mcg total) by mouth daily before breakfast. 06/01/21   Baldwin Jamaica, PA-C  losartan (COZAAR) 25 MG tablet Take 0.5 tablets (12.5 mg total) by mouth daily. 06/15/21   Baldwin Jamaica, PA-C  Multiple Vitamins-Minerals (HAIR/SKIN/NAILS PO) Take 1 tablet by mouth daily.    [provider]  spironolactone (ALDACTONE) 25 MG tablet  TAKE 1 TABLET BY MOUTH DAILY. Patient taking differently: Take 25 mg by mouth daily. 01/16/21   Evans Lance, MD  warfarin (COUMADIN) 5 MG tablet TAKE 1/2 TO 1 TABLET BY MOUTH DAILY AS DIRECTED BY COUMADIN CLINIC Patient taking differently: Take 5 mg by mouth daily at 4 PM. TAKE 1/2 TO 1 TABLET BY MOUTH DAILY AS DIRECTED BY COUMADIN CLINIC 01/15/21   Evans Lance, MD      Allergies    Patient has no known allergies.    Review of Systems   Review of Systems  Constitutional:  Positive for fatigue. Negative for appetite change.  Respiratory:  Positive for cough and shortness of breath.   Cardiovascular:  Positive for chest pain.  Gastrointestinal:  Negative for abdominal pain.  Genitourinary:  Negative for flank pain.  Musculoskeletal:  Negative for back pain.  Neurological:  Positive for weakness.   Physical Exam Updated Vital Signs BP (!) 127/54    Pulse 62    Temp 97.9 F (36.6 C)    Resp (!) 23    SpO2 95%  Physical Exam Vitals and nursing note reviewed.  HENT:     Head: Normocephalic.  Eyes:     Pupils: Pupils are equal, round, and reactive to light.  Cardiovascular:     Rate and Rhythm: Regular rhythm.  Pulmonary:     Breath sounds:  No rhonchi.     Comments: Mild right-sided tenderness.  AICD to left chest wall. Chest:     Chest wall: Tenderness present.  Abdominal:     Tenderness: There is no abdominal tenderness.  Musculoskeletal:        General: No tenderness.  Skin:    General: Skin is warm.     Capillary Refill: Capillary refill takes less than 2 seconds.  Neurological:     Mental Status: He is alert and oriented to person, place, and time.    ED Results / Procedures / Treatments   Labs (all labs ordered are listed, but only abnormal results are displayed) Labs Reviewed  URINALYSIS, ROUTINE W REFLEX MICROSCOPIC - Abnormal; Notable for the following components:      Result Value   Hgb urine dipstick SMALL (*)    Bacteria, UA RARE (*)    All other  components within normal limits  CBC - Abnormal; Notable for the following components:   WBC 12.3 (*)    All other components within normal limits  COMPREHENSIVE METABOLIC PANEL - Abnormal; Notable for the following components:   Glucose, Bld 151 (*)    Creatinine, Ser 1.45 (*)    Albumin 3.1 (*)    GFR, Estimated 53 (*)    All other components within normal limits  TROPONIN I (HIGH SENSITIVITY) - Abnormal; Notable for the following components:   Troponin I (High Sensitivity) 89 (*)    All other components within normal limits  TROPONIN I (HIGH SENSITIVITY) - Abnormal; Notable for the following components:   Troponin I (High Sensitivity) 20 (*)    All other components within normal limits  TROPONIN I (HIGH SENSITIVITY) - Abnormal; Notable for the following components:   Troponin I (High Sensitivity) 23 (*)    All other components within normal limits  RESP PANEL BY RT-PCR (FLU A&B, COVID) [id number]  PROTIME-INR  TSH  T4, FREE  T3, FREE  CBG MONITORING, ED    EKG EKG Interpretation  Date/Time:  Wednesday June 27 2021 08:06:23 EST Ventricular Rate:  62 PR Interval:  224 QRS Duration: 180 QT Interval:  538 QTC Calculation: 546 R Axis:   201 Text Interpretation: Atrial-sensed ventricular-paced rhythm with prolonged AV conduction Abnormal ECG When compared with ECG of 03-Jun-2021 18:13, PREVIOUS ECG IS PRESENT Confirmed by Davonna Belling [phone]) on 06/27/2021 6:39:53 PM  Radiology DG Chest Portable 1 View  Result Date: 06/27/2021 CLINICAL DATA:  Short of breath, urinary incontinence, leg weakness EXAM: PORTABLE CHEST 1 VIEW COMPARISON:  06/04/2021 FINDINGS: Two frontal views of the chest demonstrate stable multi lead pacer/AICD. The cardiac silhouette is enlarged. Increased central vascular congestion, with new right upper lobe airspace disease abutting the mediastinal margin concerning for pneumonia. No effusion or pneumothorax. No acute bony abnormalities. IMPRESSION: 1. New  consolidation within the medial aspect of the right upper lobe, compatible with pneumonia. 2. Mild central vascular congestion. Electronically Signed   By: Randa Ngo M.D.   On: 06/27/2021 19:06    Procedures Procedures    Medications Ordered in ED Medications  cefTRIAXone (ROCEPHIN) 1 g in sodium chloride 0.9 % 100 mL IVPB (has no administration in time range)  azithromycin (ZITHROMAX) 500 mg in sodium chloride 0.9 % 250 mL IVPB (has no administration in time range)    ED Course/ Medical Decision Making/ A&P  Medical Decision Making Problems Addressed: Community acquired pneumonia, unspecified laterality: acute illness or injury Elevated troponin: undiagnosed new problem with uncertain prognosis Generalized weakness: undiagnosed new problem with uncertain prognosis  Amount and/or Complexity of Data Reviewed External Data Reviewed: notes.    Details: recent discharge note Labs: ordered. Decision-making details documented in ED Course. Radiology: ordered and independent interpretation performed. Decision-making details documented in ED Course. ECG/medicine tests: independent interpretation performed. Decision-making details documented in ED Course. Discussion of management or test interpretation with external provider(s): [facility] and cardiology and internal medicine residents.  Risk Decision regarding hospitalization.  Patient presents with both chest pain shortness of breath cough and generalized weakness.  Has had for the last few days.  Initial differential diagnosis for this is long and includes pathologies such as pneumonia, MI, electrolyte abnormality.  Patient's EKG is paced.  Chest x-ray showed right-sided pneumonia.  Independently interpreted by me.  Also some vascular congestion.  However has had thyroid issues 2.  TSH was in the 30s when checked around a month ago.  Thought to be secondary to his amiodarone.  With increasing fatigue it has  been rechecked but not resulted yet.  Troponin mildly elevated initially at around 89.  On recheck it decreased to the 20s.  With a decreasing less likely acute cardiac ischemia, could potentially come to the pneumonia or else with the pain that he had Saturday it could still be trending down.  Cardiology will be consulted.  Also while in the ER his AICD alarmed.  Has not done this since it did a month ago and some adjustments were made.  AICD is [employer] and will be interrogated here.  With the pneumonia antibiotics have been given.  Will admit to internal medicine.         Final Clinical Impression(s) / ED Diagnoses Final diagnoses:  Generalized weakness  Community acquired pneumonia, unspecified laterality  Elevated troponin    Rx / DC Orders ED Discharge Orders     None         Davonna Belling, MD 06/27/21 2024

## 2021-06-27 NOTE — H&P (Signed)
Date: 06/27/2021               Patient Name:  Joseph Carson MRN: [id number]  DOB: [birth year] Age / Sex: 66 y.o., male   PCP: [name], DO         Medical Service: Internal Medicine Teaching Service         Attending Physician: Dr. Lottie Mussel, MD    First Contact: Dr. Jeanice Lim Pager: [phone]  Second Contact: Dr. Marianna Payment Pager: [phone]       After Hours (After 5p/  First Contact Pager: [phone]  weekends / holidays): Second Contact Pager: [phone]   Chief Complaint: SOB, cough, fatigue  History of Present Illness:   Joseph Carson is a 66 year old Caucasian male who follows with [place] in [facility], with past medical history significant for congestive heart failure, ED, ischemic cardiomyopathy, nonsustained ventricular tachycardia status post biventricular AICD placement, persistent atrial fibrillation, history of LV mural thrombus on chronic warfarin, mitral regurg, hyperlipidemia, tobacco abuse, hypothyroidism secondary to amiodarone use, who presents to [facility] ED for 1 week history of malaise, chills, shortness of breath, productive cough.  Patient reports started not feeling well about a week ago.  Reports sweating and chills, no fever.  Has had some shortness of breath and cough that is productive.  Reports labored breathing mostly on exertion.  Currently feels comfortable on room air.  Patient also endorsing intermittent right-sided upper chest pain that has occurred intermittently over the last week, not happened in the last few days.  No chest pain currently.  Checks his blood pressures at home and reports these have been normal.  Endorses congestion but no other flulike symptoms.  Has had poor p.o. intake for the last week.  Denies nausea and vomiting, difficulty swallowing, choking on liquids.  Reports medication adherence and has medication list with him which is consistent with chart review.  Patient denies missed medications, last took his medications this  morning.  Patient does report episode on Thursday where he felt weak in his lower extremities, was unable to sit up in bed, once he was finally able to get up to the edge of bed, he reports episode of urinary incontinence.  No episodes of urinary incontinence since.  Patient reports had 1 other episode of bilateral leg weakness, few months ago when he entered the bathroom and fell, denying lightheadedness, dizziness, palpitations, LOC at that time.  He thinks this may have been secondary to the amiodarone which he reports was causing side effects around that time. Had 1 episode of diarrhea earlier this week, regular BMs since.  He reports while he has been in the ED his ICD alarm went off, no shock. He denied chest pain, palpitations at that time.  Patient's daughter Carrolyn Meiers was contacted for additional information.  Ms. Owens Shark similarly reports 1 week history of patient not feeling well, loss of appetite, sweating with T-shirt soaked, chills no fever, shortness of breath, intermittent confusion mostly about the date and time.  She reports fall in the bathroom occurred November 5th and patient was brought into the ED November 7th.  ED 03/26/2021: Patient presented after fall at home, [facility] negative.  Neuro suspected possible vitamin deficiency or amiodarone toxicity.  Dr. Lovena Le (EP) gave okay to stop amiodarone per telephone 03/27/2021. Cardiology OV 05/29/21: Patient maintaining NSR off amiodarone.  ICD with some noise on atrial lead, CTM. Cardiology admission for ICD alarm 06/03/2021: Patient NSR, has single spurious high-voltage impedance, device check by  industry, LV lead threshold okay, adjusted alarm settings. [place] OV 06/15/21: Hypothyroidism follow-up, started on Synthroid.  ED Course: On arrival, afebrile mildly bradycardic to 50, otherwise hemodynamically stable satting well on room air.  Heart rates improved to 60s.  CXR was obtained and showed possible pneumonia.  Medicine called for admission.   Cardiology was also consulted for ICD interrogation.  Meds:  Current Outpatient Medications  Medication Instructions   acetaminophen (TYLENOL) 325-650 mg, Oral, Every 4 hours PRN   carvedilol (COREG) 25 mg, Oral, 2 times daily with meals   furosemide (LASIX) 40 MG tablet TAKE 1 TABLET BY MOUTH DAILY   levothyroxine (SYNTHROID) 50 mcg, Oral, Daily before breakfast   losartan (COZAAR) 12.5 mg, Oral, Daily   Multiple Vitamins-Minerals (HAIR/SKIN/NAILS PO) 1 tablet, Oral, Daily   spironolactone (ALDACTONE) 25 MG tablet TAKE 1 TABLET BY MOUTH DAILY.   warfarin (COUMADIN) 5 MG tablet TAKE 1/2 TO 1 TABLET BY MOUTH DAILY AS DIRECTED BY COUMADIN CLINIC  Reports compliance, no missed doses.  Allergies: Allergies as of 06/27/2021   (No Known Allergies)   Past Medical History:  Diagnosis Date   AICD (automatic cardioverter/defibrillator) present    Atrial fibrillation, chronic (HCC)    CAD (coronary artery disease)    a. s/p CABG x 3 03/2013 (L-LAD, S-RI, S-PDA) in [place]   CAP (community acquired pneumonia) 08/22/2015   CHF (congestive heart failure) (HCC)    Chronic combined systolic and diastolic heart failure ([place]) November 2014   Coronary artery disease    Full dentures    HLD (hyperlipidemia)     Hyperbilirubinemia    Ischemic cardiomyopathy    a. echo (10/15):  EF 20%, diff HK, apical AK, mobile density inf wall near papillary muscle (?ruptured) with mod MR, mod reduced RVSF, mild PI, PASP 37 mmHg b. s/p CRTD    LBBB (left bundle branch block)     LV (left ventricular) mural thrombus     a. TEE (10/15):  EF 15-20%, septum and ant walls AK, lat wall HK, no papillary muscle rupture, probable small thrombus on ant/ant-septal wall, mild MR, mod decreased RVSF, neg bubble study   Mitral regurgitation     mild by TEE 02/2014   NSVT (nonsustained ventricular tachycardia)         Obesity    Pulmonary hypertension ([place])    Umbilical hernia    "have had it since age [id number]"   Umbilical  hernia    Wears glasses     Family History:  Family History  Problem Relation Age of Onset   Diabetes Mother        Deceased   Thyroid disease Mother    Social History: Patient reports living in [place] with his daughter and 2 grandchildren.  Has good support at home.  Manages his own medications and performs all ADLs.  Currently not requiring assistive device for ambulation. Cigarette use, 4-5 daily. No EtOH use current or past. Occasional marijuana use.   Review of Systems: A complete ROS was negative except as per HPI.   Physical Exam: Blood pressure (!) 127/54, pulse 62, temperature 97.9 F (36.6 C), resp. rate (!) 23, SpO2 95 %. Physical Exam: General: Well appearing Caucasian male resting comfortably on his right side, NAD HENT: normocephalic, atraumatic, MMM, mild erythema posterior pharynx EYES: conjunctiva non-erythematous, no scleral icterus CV: regular rate, normal rhythm, no murmurs, rubs, gallops.  Trace to 1+ lower extremity edema bilateral lower extremities.  Pedal pulses 2+ bilaterally. Pulmonary: Occasional increased work  of breathing on RA, transmitted upper airway sounds, diffuse rhonchi Abdominal: non-distended, soft, non-tender to palpation, normal BS Skin: Warm and dry, no rashes or lesions on exposed surfaces Neurological: MS: awake, alert and oriented to self, location and situation, normal speech and fund of knowledge Motor: moves all extremities antigravity, 5/5 BUE and BLE  Psych: normal affect  CBC    Component Value Date/Time   WBC 12.3 (H) 06/27/2021 1321   RBC 4.84 06/27/2021 1321   HGB 14.8 06/27/2021 1321   HGB 16.8 08/05/2018 1049   HCT 44.7 06/27/2021 1321   HCT 48.4 08/05/2018 1049   PLT 202 06/27/2021 1321   PLT 211 08/05/2018 1049   MCV 92.4 06/27/2021 1321   MCV 90 08/05/2018 1049   MCH 30.6 06/27/2021 1321   MCHC 33.1 06/27/2021 1321   RDW 13.7 06/27/2021 1321   RDW 12.8 08/05/2018 1049   LYMPHSABS 2.3 06/03/2021 1826    LYMPHSABS 1.9 08/05/2018 1049   MONOABS 0.6 06/03/2021 1826   EOSABS 0.2 06/03/2021 1826   EOSABS 0.2 08/05/2018 1049   BASOSABS 0.1 06/03/2021 1826   BASOSABS 0.1 08/05/2018 1049   CMP     Component Value Date/Time   NA 136 06/27/2021 0819   NA 141 08/05/2018 1049   K 3.5 06/27/2021 0819   CL 100 06/27/2021 0819   CO2 25 06/27/2021 0819   GLUCOSE 151 (H) 06/27/2021 0819   BUN 23 06/27/2021 0819   BUN 19 08/05/2018 1049   CREATININE 1.45 (H) 06/27/2021 0819   CALCIUM 9.2 06/27/2021 0819   PROT 7.4 06/27/2021 0819   PROT 7.2 03/30/2021 1130   ALBUMIN 3.1 (L) 06/27/2021 0819   ALBUMIN 4.1 03/30/2021 1130   AST 19 06/27/2021 0819   ALT 13 06/27/2021 0819   ALKPHOS 67 06/27/2021 0819   BILITOT 0.8 06/27/2021 0819   BILITOT 0.6 03/30/2021 1130   GFRNONAA 53 (L) 06/27/2021 0819   GFRAA 72 08/05/2018 1049    EKG: personally reviewed my interpretation is HR ~60, atrial sensed ventricular paced NSR, extreme LAD with QRS negative in lead I and aVF.  Prolonged QTc 546 ms. No STE, depressions, t wave inversions, q waves.  CXR: personally reviewed my interpretation is: AP films, first frontal view inadequate given did not image full airspace.  Second frontal image adequate, trachea midline, no osseous abnormalities, multilead pacer/AAD present, cardiomegaly, flattened left diaphragm, no pleural effusions, new lobular opacity right upper lobe, mild central interstitial edema.  Assessment & Plan by Problem: Principal Problem:   Community acquired pneumonia  Joseph Carson is a 66 year old Caucasian male who follows with [place] in [facility], with past medical history significant for congestive heart failure, ED, ischemic cardiomyopathy, nonsustained ventricular tachycardia status post biventricular AICD placement, persistent atrial fibrillation, history of LV mural thrombus on chronic warfarin, mitral regurg, hyperlipidemia, tobacco abuse, hypothyroidism secondary to amiodarone use, who  presents to [facility] ED for 1 week history of malaise, chills, shortness of breath, productive cough and admitted for CAP appreciated on chest x-ray.  #Community-acquired pneumonia Patient presents with 1 week history SOB, cough, malaise, chills, with CXR demonstrating new consolidation within right upper lobe concerning for pneumonia.  Patient currently afebrile with mild leukocytosis 12.3, comfortable on room air with appropriate O2 saturations.  Will treat for uncomplicated CAP.  Patient did intermittently develop soft BPs with maps low 60s and therefore gave small LR bolus 500 cc with improvement in BPs. Plan: -IV ceftriaxone and IV azithromycin started -S/p LR 500 cc  bolus for soft Bps -On RA, O2 supplementation as needed -Duonebs PRN -AM CBC  #AKI On arrival, BUN 23, creatinine 1.45, estimated GFR 53.  Baseline creatinine ~1.1 and estimated GFR greater than 60. patient reports poor p.o. intake in the last week.  No GI losses reported.  AKI likely secondary to poor fluid intake. Plan: -Gave small LR 500 cc bolus for low maps low 60s -Daily BMPs -Avoid nephrotoxic agents  #NSVT s/p AICD #AICD Alarm  #Persistent Atrial Fibrillation #LV mural thrombus on chronic warfarin #Hx LBBB Switched from [place] to amiodarone for VT/V-fib with ICD shocks July 2022.  Amiodarone was discontinued secondary to amiodarone induced hypothyroidism.  Not currently on medication for ventricular arrhythmias.  Has history of ICD lead malfunction requiring extraction insertion of a new system.  ICD has intermittently alerted since January, followed closely by cardiology (EP: Dr. Lovena Le) who have been adjusting alarm settings and monitoring. EKG shows A sensed and V paced sinus rhythm. ICD alarm today while patient was in the ED. Patient denying chest pain and palpitations.  Cardiology was consulted by ED provider and will see patient. Plan: -Consult cardiology, appreciate recommendations -Continued Warfarin  -F/u PT  INR  #CAD, ischemic CM #Chronic combined congestive heart failure, EF 20% Patient denies chest pain, palpitations. EKG shows A sensed and V paced sinus rhythm with a ischemic changes.  No concern for ACS.  Fairly euvolemic on exam, mild central congestion on CXR and trace to 1+ pitting edema bilateral lower extremities.  No orthopnea.  Follows with cardiology (Dr. Aundra Dubin).  Last echo July 2022 with EF less than 20% with severely decreased LV function and global hypokinesis of LV with severely dilated LV and grade 1 diastolic dysfunction. Currently on Lasix 40 mg daily, spironolactone 25 mg daily, losartan 12.5 mg daily, Coreg 25 mg twice daily.  Jan 27 was seen by cardiology PA who increased Coreg and decrease losartan.  Patient has been intermittently hypotensive in the ED. Plan: -Consulted cardiology, appreciate recommendations -Holding Lasix, spironolactone, losartan, Coreg in the setting of soft BPs with maps low 60s.   #Hypothyroidism 2/2 amiodarone Started on Synthroid during OV with [facility] end of January.  TSH today 11 improved from 33 four weeks ago.  Plan: -Continue Synthroid 50 mcg daily  #HLD Neurology discontinued atorvastatin due to patient's intermittent lower extremity weakness due to concern for statin induced myopathy at previous ED visit.  Last lipid panel July 2022 with LDL 56.  Will recheck given patient has not been on statin. Plan:  -Repeat lipid panel -Consider restarting atorvastatin given likely history of MI given ischemic cardiomyopathy and age 77  #Elevated blood sugars Blood glucose slightly elevated 151 on arrival.  Serum glucose has also been elevated with no hemoglobin A1c documented on chart review. Plan: -Hemoglobin A1c  Diet: Heart  VTE: Warfarin IVF: None Code: Full  Dispo: Admit patient to Inpatient with expected length of stay greater than 2 midnights.  Portions of this report may have been transcribed using voice recognition software. Every effort  was made to ensure accuracy; however, inadvertent computerized transcription errors may be present.   Signed: Wayland Denis, MD 06/27/2021, 9:24 PM  PagerMB:[phone] After 5pm on weekdays and 1pm on weekends: On Call pager: [phone]

## 2021-06-27 NOTE — ED Triage Notes (Signed)
Patient states on Saturday he had an episode of urinary incontinence and bilateral leg weakness. Patient states he recently started taking a new thyroid pill and it may be related to that. Patient reports that these symptoms started occurring intermittent in July 2022 after switching from Tikosyn to amiodarone which was later stopped due to hyperthyroidism.

## 2021-06-27 NOTE — Telephone Encounter (Signed)
Daughter called in while with the patient at the hospital. She stated they have been there since 8:00 AM today 02/08. They took patient's labs and and vitals and he has been waiting in the lobby ever since. Daughter pulled up the patient's mychart and stated patient's troponin levels are very high. Due to this daughter requested an appt for him to be seen today. Advised Dr. Lovena Le, nor [facility] have availability for today. Daughter verbalized understanding then asked what they should do. Advised her based on the hospital notes they verbalized to the patient it is very important he stay in the ED until his evaluation is complete. Daughter then requested our office send a message to [facility] for him to see the patient and speed the process up for him to be seen. Advised the patient she would need to request someone within the hospital page him for [facility] to see him while hospitalized. Daughter verbalized understanding before call was disconnected.

## 2021-06-27 NOTE — ED Notes (Signed)
Pt reports he felt like his pacemaker was firing. Pacemaker interrogated.

## 2021-06-28 ENCOUNTER — Encounter (HOSPITAL_COMMUNITY): Payer: Self-pay | Admitting: Internal Medicine

## 2021-06-28 ENCOUNTER — Other Ambulatory Visit (HOSPITAL_COMMUNITY): Payer: Self-pay

## 2021-06-28 DIAGNOSIS — Z95810 Presence of automatic (implantable) cardiac defibrillator: Secondary | ICD-10-CM

## 2021-06-28 DIAGNOSIS — I5022 Chronic systolic (congestive) heart failure: Secondary | ICD-10-CM

## 2021-06-28 DIAGNOSIS — J189 Pneumonia, unspecified organism: Principal | ICD-10-CM

## 2021-06-28 LAB — LIPID PANEL
Cholesterol: 139 mg/dL (ref 0–200)
HDL: 19 mg/dL — ABNORMAL LOW (ref >40)
LDL Cholesterol: 94 mg/dL (ref 0–99)
Total CHOL/HDL Ratio: 7.3 RATIO
Triglycerides: 128 mg/dL (ref <150)
VLDL: 26 mg/dL (ref 0–40)

## 2021-06-28 LAB — BASIC METABOLIC PANEL
Anion gap: 7 (ref 5–15)
BUN: 18 mg/dL (ref 8–23)
CO2: 25 mmol/L (ref 22–32)
Calcium: 8.6 mg/dL — ABNORMAL LOW (ref 8.9–10.3)
Chloride: 104 mmol/L (ref 98–111)
Creatinine, Ser: 1.06 mg/dL (ref 0.61–1.24)
GFR, Estimated: >60 mL/min (ref >60)
Glucose, Bld: 87 mg/dL (ref 70–99)
Potassium: 3.5 mmol/L (ref 3.5–5.1)
Sodium: 136 mmol/L (ref 135–145)

## 2021-06-28 LAB — CBC
HCT: 39.5 % (ref 39.0–52.0)
Hemoglobin: 13 g/dL (ref 13.0–17.0)
MCH: 30 pg (ref 26.0–34.0)
MCHC: 32.9 g/dL (ref 30.0–36.0)
MCV: 91.2 fL (ref 80.0–100.0)
Platelets: 189 10*3/uL (ref 150–400)
RBC: 4.33 MIL/uL (ref 4.22–5.81)
RDW: 13.7 % (ref 11.5–15.5)
WBC: 10.5 10*3/uL (ref 4.0–10.5)
nRBC: 0 % (ref 0.0–0.2)

## 2021-06-28 LAB — HEMOGLOBIN A1C
Hgb A1c MFr Bld: 5.6 % (ref 4.8–5.6)
Mean Plasma Glucose: 114.02 mg/dL

## 2021-06-28 LAB — PROTIME-INR
INR: 3.8 — ABNORMAL HIGH (ref 0.8–1.2)
INR: 3.6 — ABNORMAL HIGH (ref 0.8–1.2)
Prothrombin Time: 37.6 seconds — ABNORMAL HIGH (ref 11.4–15.2)
Prothrombin Time: 36.2 seconds — ABNORMAL HIGH (ref 11.4–15.2)

## 2021-06-28 LAB — T4, FREE: Free T4: 0.97 ng/dL (ref 0.61–1.12)

## 2021-06-28 MED ORDER — WARFARIN - PHARMACIST DOSING INPATIENT: Freq: Every day | Status: DC

## 2021-06-28 MED ORDER — SODIUM CHLORIDE 0.9 % IV SOLN
500.0000 mg | INTRAVENOUS | Status: DC
  Administered 2021-06-28 – 2021-06-29 (×2): 500 mg via INTRAVENOUS
  Filled 2021-06-28 (×3): qty 5

## 2021-06-28 MED ORDER — NICOTINE 7 MG/24HR TD PT24
7.0000 mg | MEDICATED_PATCH | Freq: Every day | TRANSDERMAL | Status: DC
  Filled 2021-06-28 (×2): qty 1

## 2021-06-28 MED ORDER — SODIUM CHLORIDE 0.9 % IV SOLN
1.0000 g | INTRAVENOUS | Status: DC
  Administered 2021-06-28 – 2021-06-29 (×2): 1 g via INTRAVENOUS
  Filled 2021-06-28 (×2): qty 10

## 2021-06-28 NOTE — ED Notes (Signed)
Provided Derrel Nip RN with report electronically. Also called RN and was advised that she hasn't looked at chart yet

## 2021-06-28 NOTE — Consult Note (Signed)
ELECTROPHYSIOLOGY CONSULT NOTE    Patient ID: Rushton Divan MRN: [id number], DOB/AGE: 01/10/1956 66 y.o.  Admit date: 06/27/2021 Date of Consult: 06/28/2021  Primary Physician: [name], DO Primary Cardiologist: None  Electrophysiologist: Dr. Lovena Le  Referring Provider: Dr. Cain Sieve  Patient Profile: Masaharu Wolbert is a 66 y.o. male with a history of VT, ICD, AFib, CAD (CABG 2014), chronic CHF (combined), HTN, HLD, LBBB, LV thrombus (2015), and device extraction and re-implantation 07/2018 for lead malfunction who is being seen today for the evaluation of ICD alarm at the request of Dr. Cain Sieve.  HPI:  Chima Espericueta is a 66 y.o. male with medical history as above. Well known to EP team.   Admiited 11/30/20 with multiple ICD shocks appropriate 2/2 VT/VF in setting of COVID+. Tiksoyn was stopped and started amiodarone.   He had f/u with Dr. Lovena Le , mentioned noise on the A lead with plans to monitor, discussed appropriate therapies for VT, reminded of no driving.   Device clinic note 03/15/21 for an alert 2/2 RV defib impedance,  >160 on 03/13/21.  lead impedence testing at subsequent f/u reflected RV defib impedence of 96 ohms consistent with trends, no changes were made.    Taken off amiodarone for myalgias and significant TSH elevation.   He saw Dr. Lovena Le 05/29/21, He had a device alert (beeping per the pt) for elevated HV impedence, and known A lead noise. No changes were made, planned to monitor device function closely.   ER visit  06/03/21 for device beeping again, again had an elevated HV impedence, parameters adjusted from > 160 to >200 Ohms. No other acute medical issues noted and discharged from the ER, BP looked like it may tolerate low dose ARB at that time and losartan added  Seen again in clinic 1/27 with no further HV impedance alerts. Low effective pacing noted with frequent PVCs. BB increased  He presented to ED yesterday with 1 week  productive cough, SOB , and chest discomfort. Found to have RUL PNA. CAP management initiated.   Pertinent labs on admission include Cr 1.4, K 3.5, COVID negative, WBC 12.3, Hgb 14.8.  His device has again alarmed for impedance > 200.  [employer] interrogated today and cannot guarantee effective shock.  Recommend device revision.   Pt in NAD, continues to have productive cough. Denies exertional chest pain. No syncope.   Past Medical History:  Diagnosis Date   AICD (automatic cardioverter/defibrillator) present    Atrial fibrillation, chronic (HCC)    CAD (coronary artery disease)    a. s/p CABG x 3 03/2013 (L-LAD, S-RI, S-PDA) in [place]   CAP (community acquired pneumonia) 08/22/2015   CHF (congestive heart failure) (HCC)    Chronic combined systolic and diastolic heart failure ([place]) November 2014   Coronary artery disease    Full dentures    HLD (hyperlipidemia)     Hyperbilirubinemia    Ischemic cardiomyopathy    a. echo (10/15):  EF 20%, diff HK, apical AK, mobile density inf wall near papillary muscle (?ruptured) with mod MR, mod reduced RVSF, mild PI, PASP 37 mmHg b. s/p CRTD    LBBB (left bundle branch block)     LV (left ventricular) mural thrombus     a. TEE (10/15):  EF 15-20%, septum and ant walls AK, lat wall HK, no papillary muscle rupture, probable small thrombus on ant/ant-septal wall, mild MR, mod decreased RVSF, neg bubble study   Mitral regurgitation     mild by TEE  02/2014   NSVT (nonsustained ventricular tachycardia)         Obesity    Pulmonary hypertension ([place])    Umbilical hernia    "have had it since age [id number]"   Umbilical hernia    Wears glasses      Surgical History:  Past Surgical History:  Procedure Laterality Date   BI-VENTRICULAR IMPLANTABLE CARDIOVERTER DEFIBRILLATOR N/A 09/01/2014   MDT CRTD implanted by Dr Lovena Le   CARDIAC CATHETERIZATION  03/2013   CARDIOVERSION N/A 12/04/2017   Procedure: CARDIOVERSION;  Surgeon: Evans Lance, MD;   Location: [facility];  Service: Cardiovascular;  Laterality: N/A;   CORONARY ARTERY BYPASS GRAFT  03/2013   LIMA to the LAD, SVG to the ramus, SVG to the PDA   CORONARY ARTERY BYPASS GRAFT  2014   CABG "X3"   FRACTURE SURGERY     ICD LEAD REMOVAL N/A 08/14/2018   Procedure: ICD LEAD REMOVAL;  Surgeon: Evans Lance, MD;  Location: [facility];  Service: Cardiovascular;  Laterality: N/A;   INSERT / REPLACE / REMOVE PACEMAKER  2016   LEAD INSERTION N/A 08/14/2018   Procedure: LEAD INSERTION;  Surgeon: Evans Lance, MD;  Location: [facility];  Service: Cardiovascular;  Laterality: N/A;   LEAD REVISION/REPAIR N/A 12/04/2017   Procedure: LEAD REVISION/REPAIR;  Surgeon: Evans Lance, MD;  Location: [facility];  Service: Cardiovascular;  Laterality: N/A;   MULTIPLE TOOTH EXTRACTIONS     PATELLA FRACTURE SURGERY Left 1972   TEE WITHOUT CARDIOVERSION N/A 03/04/2014   Procedure: TRANSESOPHAGEAL ECHOCARDIOGRAM (TEE);  Surgeon: Larey Dresser, MD;  Location: [facility];  Service: Cardiovascular;  Laterality: N/A;   TEE WITHOUT CARDIOVERSION N/A 12/02/2017   Procedure: TRANSESOPHAGEAL ECHOCARDIOGRAM (TEE);  Surgeon: Buford Dresser, MD;  Location: [facility];  Service: Cardiovascular;  Laterality: N/A;     (Not in a hospital admission)   Inpatient Medications:   levothyroxine  50 mcg Oral Q0600   nicotine  7 mg Transdermal Daily   Warfarin - Pharmacist Dosing Inpatient   Does not apply q1600    Allergies: No Known Allergies  Social History   Socioeconomic History   Marital status: Divorced    Spouse name: Not on file   Number of children: Not on file   Years of education: Not on file   Highest education level: Not on file  Occupational History   Not on file  Tobacco Use   Smoking status: Every Day    Packs/day: 0.25    Years: 47.00    Pack years: 11.75    Types: Cigarettes   Smokeless tobacco: Never   Tobacco comments:    3-4 per day   Vaping Use   Vaping  Use: Never used  Substance and Sexual Activity   Alcohol use: Not Currently    Alcohol/week: 0.0 standard drinks    Comment: "quit drinking when I was 23"   Drug use: Yes    Types: Marijuana    Comment:  "weekly" last use week of 08/07/2018   Sexual activity: Not Currently  Other Topics Concern   Not on file  Social History Narrative   ** Merged History Encounter **       Social Determinants of Health   Financial Resource Strain: Not on file  Food Insecurity: Not on file  Transportation Needs: Not on file  Physical Activity: Not on file  Stress: Not on file  Social Connections: Not on file  Intimate Partner Violence: Not  on file     Family History  Problem Relation Age of Onset   Diabetes Mother        Deceased   Thyroid disease Mother      Review of Systems: All other systems reviewed and are otherwise negative except as noted above.  Physical Exam: Vitals:   06/28/21 0645 06/28/21 0936 06/28/21 0945 06/28/21 0953  BP: (!) 108/48 123/68 116/67   Pulse: (!) 59 62 (!) 59   Resp: 20 19 16    Temp:      TempSrc:      SpO2: 90% 97% 96%   Weight:    102.1 kg  Height:    5\' 10"  (1.778 m)    GEN- The patient is well appearing, alert and oriented x 3 today.   HEENT: normocephalic, atraumatic; sclera clear, conjunctiva pink; hearing intact; oropharynx clear; neck supple Lungs- Clear to ausculation bilaterally, normal work of breathing.  No wheezes, rales, rhonchi Heart- Regular rate and rhythm, no murmurs, rubs or gallops GI- soft, non-tender, non-distended, bowel sounds present Extremities- no clubbing, cyanosis, or edema; DP/PT/radial pulses 2+ bilaterally MS- no significant deformity or atrophy Skin- warm and dry, no rash or lesion Psych- euthymic mood, full affect Neuro- strength and sensation are intact  Labs:   Lab Results  Component Value Date   WBC 10.5 06/28/2021   HGB 13.0 06/28/2021   HCT 39.5 06/28/2021   MCV 91.2 06/28/2021   PLT 189 06/28/2021     Recent Labs  Lab 06/27/21 0819 06/28/21 0609  NA 136 136  K 3.5 3.5  CL 100 104  CO2 25 25  BUN 23 18  CREATININE 1.45* 1.06  CALCIUM 9.2 8.6*  PROT 7.4  --   BILITOT 0.8  --   ALKPHOS 67  --   ALT 13  --   AST 19  --   GLUCOSE 151* 87      Radiology/Studies: DG Chest Portable 1 View  Result Date: 06/27/2021 CLINICAL DATA:  Short of breath, urinary incontinence, leg weakness EXAM: PORTABLE CHEST 1 VIEW COMPARISON:  06/04/2021 FINDINGS: Two frontal views of the chest demonstrate stable multi lead pacer/AICD. The cardiac silhouette is enlarged. Increased central vascular congestion, with new right upper lobe airspace disease abutting the mediastinal margin concerning for pneumonia. No effusion or pneumothorax. No acute bony abnormalities. IMPRESSION: 1. New consolidation within the medial aspect of the right upper lobe, compatible with pneumonia. 2. Mild central vascular congestion. Electronically Signed   By: Randa Ngo M.D.   On: 06/27/2021 19:06   DG Chest Port 1 View  Result Date: 06/04/2021 CLINICAL DATA:  Pacemaker malfunction. EXAM: PORTABLE CHEST 1 VIEW COMPARISON:  Portable chest 11/30/2020. FINDINGS: The heart enlarged but unchanged, with old CABG changes. No vascular congestion is seen. Stable mediastinum with minor aortic tortuosity, with patchy calcification in the arch. Left chest dual lead pacing system with an AID wire, and wire insertions are unaltered. The lungs hyperexpanded but clear. The sulci are sharp. Osteopenia. IMPRESSION: Cardiomegaly with left chest pacing system with stable wire insertions. No evidence of acute chest disease or interval changes. Electronically Signed   By: Telford Nab M.D.   On: 06/04/2021 03:56   CUP PACEART INCLINIC DEVICE CHECK  Result Date: 06/15/2021 CRT-D device check in office. Thresholds and sensing consistent with previous device measurements. Lead impedance trends stable over time. No mode switch episodes recorded. No  ventricular arrhythmia episodes recorded. Patient bi-ventricularly pacing _98,  effective 85%_% of the time. Device  programmed with appropriate safety margins. Heart failure diagnostics reviewed and trends are stable for patient. Audible/vibratory alerts demonstrated for patient. No changes made this session. Estimated longevity 2.5 years___.  Patient enrolled in remote follow up. Plan to check device remotely in 3 months and see in office in 6 months. Patient education completed including shock plan. see office note for details  CUP PACEART REMOTE DEVICE CHECK  Result Date: 06/03/2021 Scheduled remote reviewed. Normal device function.  High RV defib lead impedance on 06/02/2021, set to triage, see previous report. LV amplitude programmed sub threshold, Sent to triage per protocol Next remote 91 days. Kathy Breach, RN, CCDS, [employer]  CUP PACEART REMOTE DEVICE CHECK  Result Date: 06/03/2021 Scheduled remote reviewed. Normal device function.  RV defib impedance elevated at 217 ohms, sent to triage per protocol LV amplitude programmed right a threshold Next remote 91 days. Kathy Breach, RN, CCDS, [employer]   EKG:A sensed BiV paced (personally reviewed)  TELEMETRY: BiV pacing (personally reviewed)  DEVICE HISTORY:  MDT CRT-D implanted 10/14/2018 RA and CS leads are from  09/01/2014 Original RV lead extracted 2/2 noise, 10/14/2018   AAD Hx Tikosyn stopped >>  Amiodarone started  >> stopped Nov 2022 2/2 myalgias  Assessment/Plan:  1.  ICD He has continued to have HV impedance alerts for > 200. [employer] assessed today and we do not think we can guarantee an effective shock any longer.  Recommend Lifevest on discharge with system revision.  Of note, he has previously needed device extraction/re-implantation for lead malfunction.    VT Admitted July 2022 with multiple appropriate shocks, started on amiodarone Amio stopped Nov 2022 2/2 myalgias (lipitor also stopped) TSH  up markedly, T2,T4 recently. Started on Synthroid PMD is following Continue BB   CAD Denies s/s ischemia Doesn't need ASA with stable disease on coumadin.    Chronic CHF (systolic) ICM No symptoms or exam findings of volume OL OptiVol looks good   Paroxysmal Afib CHA2DS2Vasc is 6 on coumadin.  Off tiksoyn July 2022 in setting of VT/VF Off amiodarone Nov 2022 2/2 myalgias No AAD currently    H/o LV thrombus Best data is for coumadin, Despite successful use of DOACs in some patients, discussed with MD and pharmacy and would not recommend transition to [place] at this time.   8. CAP On ABX Appreciate medicine care.   EP will follow along for discharge planning; Will require lifevest and need planning for system revision up to and including another extraction  For questions or updates, please contact [place] Please consult [email or web address] for contact info under Cardiology/STEMI.  Christoph, [place], PA-C  06/28/2021 9:59 AM

## 2021-06-28 NOTE — Progress Notes (Signed)
ANTICOAGULATION CONSULT NOTE - Initial Consult  Pharmacy Consult for Coumadin Indication: LV thrombus  No Known Allergies  Patient Measurements:    Vital Signs: Temp: 97.9 F (36.6 C) (02/08 1611) BP: 121/58 (02/08 2315) Pulse Rate: 91 (02/08 2315)  Labs: Recent Labs    06/27/21 0819 06/27/21 1321 06/27/21 1830 06/27/21 2328  HGB  --  14.8  --   --   HCT  --  44.7  --   --   PLT  --  202  --   --   LABPROT  --   --   --  37.6*  INR  --   --   --  3.8*  CREATININE 1.45*  --   --   --   TROPONINIHS 89* 20* 23*  --     Estimated Creatinine Clearance: 59.8 mL/min (A) (by C-G formula based on SCr of 1.45 mg/dL (H)).   Medical History: Past Medical History:  Diagnosis Date   AICD (automatic cardioverter/defibrillator) present    Atrial fibrillation, chronic (HCC)    CAD (coronary artery disease)    a. s/p CABG x 3 03/2013 (L-LAD, S-RI, S-PDA) in [place]   CAP (community acquired pneumonia) 08/22/2015   CHF (congestive heart failure) (HCC)    Chronic combined systolic and diastolic heart failure (HCC) November 2014   Coronary artery disease    Full dentures    HLD (hyperlipidemia)     Hyperbilirubinemia    Ischemic cardiomyopathy    a. echo (10/15):  EF 20%, diff HK, apical AK, mobile density inf wall near papillary muscle (?ruptured) with mod MR, mod reduced RVSF, mild PI, PASP 37 mmHg b. s/p CRTD    LBBB (left bundle branch block)     LV (left ventricular) mural thrombus     a. TEE (10/15):  EF 15-20%, septum and ant walls AK, lat wall HK, no papillary muscle rupture, probable small thrombus on ant/ant-septal wall, mild MR, mod decreased RVSF, neg bubble study   Mitral regurgitation     mild by TEE 02/2014   NSVT (nonsustained ventricular tachycardia)         Obesity    Pulmonary hypertension (HCC)    Umbilical hernia    "have had it since age [age]"   Umbilical hernia    Wears glasses     Medications:  No current facility-administered medications on file  prior to encounter.   Current Outpatient Medications on File Prior to Encounter  Medication Sig Dispense Refill   acetaminophen (TYLENOL) 325 MG tablet Take 1-2 tablets (325-650 mg total) by mouth every 4 (four) hours as needed for mild pain.     carvedilol (COREG) 25 MG tablet Take 1 tablet (25 mg total) by mouth 2 (two) times daily with a meal. 180 tablet 1   furosemide (LASIX) 40 MG tablet TAKE 1 TABLET BY MOUTH DAILY (Patient taking differently: Take 40 mg by mouth daily.) 90 tablet 3   levothyroxine (SYNTHROID) 50 MCG tablet Take 1 tablet (50 mcg total) by mouth daily before breakfast. 90 tablet 1   losartan (COZAAR) 25 MG tablet Take 0.5 tablets (12.5 mg total) by mouth daily. 45 tablet 1   Multiple Vitamins-Minerals (HAIR/SKIN/NAILS PO) Take 1 tablet by mouth daily.     spironolactone (ALDACTONE) 25 MG tablet TAKE 1 TABLET BY MOUTH DAILY. (Patient taking differently: Take 25 mg by mouth daily.) 90 tablet 3   warfarin (COUMADIN) 5 MG tablet TAKE 1/2 TO 1 TABLET BY MOUTH DAILY AS DIRECTED BY  COUMADIN CLINIC (Patient taking differently: Take 2.5-5 mg by mouth daily at 4 PM. Take one 5 mg  tablet by mouth on Tuesday, Wednesday, Thursday and Saturdays per patient. Then take 2.5 mg rest of days) 90 tablet 1     Assessment: 66 y.o. male admitted with AMS/weakness, h/o LV thrombus, to continue Coumadin.  INR supratherapeutic tonight Goal of Therapy:  INR 2-3 Monitor platelets by anticoagulation protocol: Yes   Plan:  No Coumadin tonight F/U daily INR  Joseph Carson 06/28/2021,12:10 AM

## 2021-06-28 NOTE — Progress Notes (Signed)
ANTICOAGULATION CONSULT NOTE - Follow Up Consult  Pharmacy Consult for Warfarin Indication:  LV thrombus  No Known Allergies  Patient Measurements: Height: 5\' 10"  (177.8 cm) Weight: 102.1 kg (225 lb) IBW/kg (Calculated) : 73  Vital Signs: BP: 117/65 (02/09 1415) Pulse Rate: 59 (02/09 1415)  Labs: Recent Labs    06/27/21 0819 06/27/21 1321 06/27/21 1830 06/27/21 2328 06/28/21 0609 06/28/21 1414  HGB  --  14.8  --   --  13.0  --   HCT  --  44.7  --   --  39.5  --   PLT  --  202  --   --  189  --   LABPROT  --   --   --  37.6*  --  36.2*  INR  --   --   --  3.8*  --  3.6*  CREATININE 1.45*  --   --   --  1.06  --   TROPONINIHS 89* 20* 23*  --   --   --     Estimated Creatinine Clearance: 83.1 mL/min (by C-G formula based on SCr of 1.06 mg/dL).    Assessment: 66 y.o. male admitted with AMS/weakness, h/o LV thrombus, to continue Coumadin.  INR remains supratherapeutic at 3.6.   Goal of Therapy:  INR 2-3 Monitor platelets by anticoagulation protocol: Yes   Plan:  Hold warfarin again tonight F/up INR tomorrow morning  Joseph Art, Pharm.D. PGY-1 Pharmacy Resident [phone] 06/28/2021 4:27 PM

## 2021-06-28 NOTE — Discharge Summary (Signed)
Name: Joseph Carson MRN: [id number] DOB: [birth year] 66 y.o. PCP: [name], DO  Date of Admission: 06/27/2021  8:01 AM Date of Discharge: 06/30/2021 Attending Physician: Lottie Mussel, MD  Discharge Diagnosis: CAP ICD alarm AKI Hypothyroidism 2/2 amiodarone use  Discharge Medications: Allergies as of 06/30/2021   No Known Allergies      Medication List     TAKE these medications    acetaminophen 325 MG tablet Commonly known as: TYLENOL Take 1-2 tablets (325-650 mg total) by mouth every 4 (four) hours as needed for mild pain.   amoxicillin-clavulanate 875-125 MG tablet Commonly known as: Augmentin Take 1 tablet by mouth 2 (two) times daily for 2 days.   carvedilol 25 MG tablet Commonly known as: COREG Take 1 tablet (25 mg total) by mouth 2 (two) times daily with a meal.   furosemide 40 MG tablet Commonly known as: LASIX TAKE 1 TABLET BY MOUTH DAILY   HAIR/SKIN/NAILS PO Take 1 tablet by mouth daily.   levothyroxine 50 MCG tablet Commonly known as: Synthroid Take 1 tablet (50 mcg total) by mouth daily before breakfast.   losartan 25 MG tablet Commonly known as: COZAAR Take 0.5 tablets (12.5 mg total) by mouth daily.   spironolactone 25 MG tablet Commonly known as: ALDACTONE TAKE 1 TABLET BY MOUTH DAILY.   warfarin 5 MG tablet Commonly known as: COUMADIN Take as directed. If you are unsure how to take this medication, talk to your nurse or doctor. Original instructions: TAKE 1/2 TO 1 TABLET BY MOUTH DAILY AS DIRECTED BY COUMADIN CLINIC What changed:  how much to take how to take this when to take this additional instructions               Durable Medical Equipment  (From admission, onward)           Start     Ordered   06/28/21 1026  For home use only DME Vest life vest  Once       Comments: For Ventricular Tachycardia ICD lead failure   06/28/21 1030            Disposition and follow-up:   Mr.Leevi Acxel Tumminia  was discharged from [facility] in Stable condition.  At the hospital follow up visit please address:  Please adjust Warfarin dosing per pharmacy and follow up coumadin clinic 07/10/21  2.  Labs / imaging needed at time of follow-up: INR  3.  Pending labs/ test needing follow-up: None  Follow-up Appointments:  Follow-up Information     Masters, Joellen Jersey, DO. Schedule Joseph appointment as soon as possible for a visit.   Specialty: Internal Medicine Contact information: [place] [address] [place] Hospital Course by problem list: CAP-patient presented with flulike symptoms for 1 week.  Chest x-ray consistent with pneumonia revealing RUL consolidation.  No oxygen requirement since admission.  Patient saturated well on room air greater than 95% as well as ambulating.  Patient endorses improvement in symptoms since admission overnight.  Patient received 2 doses of IV ceftriaxone and 2 doses of azithromycin.  Suspicion for aspiration pneumonia low no indication for coverage for anaerobes.  PT/OT eval recommended no outpatient follow-up.  At time of discharge patient will be switched to oral Augmentin for 2 days for completion of antibiotics regimen. ICD alarm-patient has history of ICD lead malfunction requiring extraction and insertion of a new  system.  ICD alarm alerted while in the ED during admission.  Patient denies chest pain or palpitations.  Cardiology consulted and EP recommends placement of [facility] outpatient.  Patient was continued on warfarin. AKI-at baseline patient's creatinine level is 1.1.  Initial labs obtained in the ED significant for elevated creatinine of 1.45.  Patient endorsed poor oral intake for the past week.  Dehydration likely etiology of AKI.  Patient is able to tolerate food and water during admission and was encouraged oral fluid hydration. AKI resolved at time to discharge. Hypothyroidism 2/2 amiodarone  use-patient has history of use of amiodarone for rhythm control of A-fib.  That medication has since been discontinued.  TSH assessed during admission, 11 (improved from 4 weeks ago, 33).  Synthroid 50 mcg continue on admission.  No symptoms to date.  Patient stable otherwise.  Discharge Exam:   BP 106/66 (BP Location: Left Arm)    Pulse 60    Temp 98.3 F (36.8 C) (Oral)    Resp (!) 21    Ht 5\' 10"  (1.778 m)    Wt 102.1 kg    SpO2 93%    BMI 32.28 kg/m   Constitutional:Resting comfortably in bed. No acute distress. Cardio:Regular rate and rhythm.  Pulm:Clear to auscultation bilaterally. Normal work of breathing on room air. Abdomen:Soft, non-distended, non-tender. QF:[id number] for extremity edema. Skin:Warm and dry. Neuro:Alert and oriented x3. No focal deficit noted. Psych:Normal mood and affect.  Pertinent Labs, Studies, and Procedures:  CBC Latest Ref Rng & Units 06/28/2021 06/27/2021 06/04/2021  WBC 4.0 - 10.5 K/uL 10.5 12.3(H) 10.0  Hemoglobin 13.0 - 17.0 g/dL 13.0 14.8 15.8  Hematocrit 39.0 - 52.0 % 39.5 44.7 47.3  Platelets 150 - 400 K/uL 189 202 170   BMP Latest Ref Rng & Units 06/28/2021 06/27/2021 06/04/2021  Glucose 70 - 99 mg/dL 87 151(H) 92  BUN 8 - 23 mg/dL 18 23 19   Creatinine 0.61 - 1.24 mg/dL 1.06 1.45(H) 1.09  BUN/Creat Ratio 10 - 24 - - -  Sodium 135 - 145 mmol/L 136 136 136  Potassium 3.5 - 5.1 mmol/L 3.5 3.5 3.6  Chloride 98 - 111 mmol/L 104 100 104  CO2 22 - 32 mmol/L 25 25 24   Calcium 8.9 - 10.3 mg/dL 8.6(L) 9.2 9.0   DG Chest Portable 1 View  Result Date: 06/27/2021 CLINICAL DATA:  Short of breath, urinary incontinence, leg weakness EXAM: PORTABLE CHEST 1 VIEW COMPARISON:  06/04/2021 FINDINGS: Two frontal views of the chest demonstrate stable multi lead pacer/AICD. The cardiac silhouette is enlarged. Increased central vascular congestion, with new right upper lobe airspace disease abutting the mediastinal margin concerning for pneumonia. No effusion or pneumothorax.  No acute bony abnormalities. IMPRESSION: 1. New consolidation within the medial aspect of the right upper lobe, compatible with pneumonia. 2. Mild central vascular congestion. Electronically Signed   By: Randa Ngo M.D.   On: 06/27/2021 19:06   DG Chest Port 1 View  Result Date: 06/04/2021 CLINICAL DATA:  Pacemaker malfunction. EXAM: PORTABLE CHEST 1 VIEW COMPARISON:  Portable chest 11/30/2020. FINDINGS: The heart enlarged but unchanged, with old CABG changes. No vascular congestion is seen. Stable mediastinum with minor aortic tortuosity, with patchy calcification in the arch. Left chest dual lead pacing system with Joseph AID wire, and wire insertions are unaltered. The lungs hyperexpanded but clear. The sulci are sharp. Osteopenia. IMPRESSION: Cardiomegaly with left chest pacing system with stable wire insertions. No evidence of acute chest disease or interval changes. Electronically Signed  By: Telford Nab M.D.   On: 06/04/2021 03:56   CUP PACEART INCLINIC DEVICE CHECK  Result Date: 06/15/2021 CRT-D device check in office. Thresholds and sensing consistent with previous device measurements. Lead impedance trends stable over time. No mode switch episodes recorded. No ventricular arrhythmia episodes recorded. Patient bi-ventricularly pacing _98,  effective 85%_% of the time. Device programmed with appropriate safety margins. Heart failure diagnostics reviewed and trends are stable for patient. Audible/vibratory alerts demonstrated for patient. No changes made this session. Estimated longevity 2.5 years___.  Patient enrolled in remote follow up. Plan to check device remotely in 3 months and see in office in 6 months. Patient education completed including shock plan. see office note for details  CUP PACEART REMOTE DEVICE CHECK  Result Date: 06/03/2021 Scheduled remote reviewed. Normal device function.  High RV defib lead impedance on 06/02/2021, set to triage, see previous report. LV amplitude  programmed sub threshold, Sent to triage per protocol Next remote 91 days. Kathy Breach, RN, CCDS, [employer]  CUP PACEART REMOTE DEVICE CHECK  Result Date: 06/03/2021 Scheduled remote reviewed. Normal device function.  RV defib impedance elevated at 217 ohms, sent to triage per protocol LV amplitude programmed right a threshold Next remote 91 days. Kathy Breach, RN, CCDS, [employer]    Discharge Instructions: Discharge Instructions     Call MD for:  difficulty breathing, headache or visual disturbances   Complete by: As directed    Call MD for:  extreme fatigue   Complete by: As directed    Call MD for:  hives   Complete by: As directed    Call MD for:  persistant dizziness or light-headedness   Complete by: As directed    Call MD for:  persistant nausea and vomiting   Complete by: As directed    Call MD for:  redness, tenderness, or signs of infection (pain, swelling, redness, odor or green/yellow discharge around incision site)   Complete by: As directed    Call MD for:  severe uncontrolled pain   Complete by: As directed    Call MD for:  temperature >100.4   Complete by: As directed    Diet - low sodium heart healthy   Complete by: As directed    Increase activity slowly   Complete by: As directed        Signed: Farrel Gordon, DO 06/30/2021, 10:34 AM   Pager: [phone]

## 2021-06-28 NOTE — ED Notes (Signed)
Patient ambulated well in hallway. O2 read at 95%.

## 2021-06-28 NOTE — ED Notes (Signed)
Received verbal report from Kasey C RN at this time °

## 2021-06-28 NOTE — TOC Benefit Eligibility Note (Signed)
Patient Advocate Encounter ° °Insurance verification completed.   ° °The patient is currently admitted and upon discharge could be taking Eliquis 5 mg. ° °The current 30 day co-pay is, $10.35.  ° °The patient is insured through [employer] Medicare Part D  ° ° ° °Zion Ta, CPhT °Pharmacy Patient Advocate Specialist °[facility] Patient Advocate Team °Direct Number: [phone]  Fax: [phone] ° ° ° ° ° °  °

## 2021-06-28 NOTE — ED Notes (Signed)
Pt made aware of room assignment at this time 

## 2021-06-28 NOTE — Progress Notes (Signed)
Subjective: I seen and evaluated Joseph Carson at bedside.  He states improvement in his symptoms overnight.  Endorsing that his breathing is better and denies chest discomfort.  He was sitting at the edge of the bed eating breakfast.  Endorses improvement in his appetite.  Denies any other complaints at this time.  Objective:  Vital signs in last 24 hours: Vitals:   06/28/21 0530 06/28/21 0600 06/28/21 0630 06/28/21 0645  BP: (!) 102/55 (!) 102/49 (!) 114/48 (!) 108/48  Pulse: (!) 59 (!) 59 62 (!) 59  Resp: (!) 21 (!) 22 (!) 23 20  Temp:      TempSrc:      SpO2: 93% (!) 89% 93% 90%   Physical Exam Constitutional:      General: He is awake. He is not in acute distress. HENT:     Head: Normocephalic and atraumatic.  Cardiovascular:     Rate and Rhythm: Normal rate and regular rhythm.     Heart sounds: Normal heart sounds.  Pulmonary:     Effort: Pulmonary effort is normal.     Breath sounds: Wheezing present.  Chest:     Comments: ICD left upper chest Musculoskeletal:     Right lower leg: No edema.     Left lower leg: No edema.  Skin:    General: Skin is warm and dry.  Neurological:     General: No focal deficit present.     Mental Status: He is alert.  Psychiatric:        Behavior: Behavior normal. Behavior is cooperative.     Assessment/Plan:  Principal Problem:   Community acquired pneumonia Active Problems:   HLD (hyperlipidemia)   Chronic systolic CHF (congestive heart failure) (HCC)   Biventricular automatic implantable cardioverter defibrillator in situ   Warfarin-induced coagulopathy (HCC)   CAP vs Aspiration Pneumonia  Pt presents with flu-like syx for 1 week. Pt's daugher endorse confusion and poor oral intake associated which raised suspicion for aspiration pneumonia. CXR consistent with pneumonia, RUL consolidation which is typical location for aspiration. Pt denies dysphagia or choking. No O2 requirement since admission. Saturating well on RA >95%.  Wheezing noted on lung auscultation. Pt endorse improvement in symptoms since admission, stating his breathing has improved and right sided chest discomfort has improved. Since admission, patient has received 1 dose of IV ceftriaxone and 1 dose of azithromycin. Clinical hx doesn't strongly suggest aspiration over other causes of CAP, will not add coverage for anaerobes.  --ambulatory pulse ox to evaluate breathing status on exertion --PT/OT eval --Continue 1g IV ceftriaxone and IV azithromycin 500mg   --At discharge, switch to oral Augmentin  NSVT s/p ACID AICD Alarm Atrial fibrillation LV mural thrombus on Warfarin Has history of ICD lead malfunction requiring extraction insertion of a new system.  ICD has intermittently alerted since January, followed closely by cardiology (EP: Dr. Lovena Le) who have been adjusting alarm settings and monitoring. EKG shows A sensed and V paced sinus rhythm. ICD alarm while in the ED during admission last night. Pt denies chest pain or palpitations. Pt presented with prolonged INR 3.8, stating he did not take his warfarin yesterday. Last INR check three weeks ago, INR -1.8. Recent studies show DOAC vs Warafin is non-inferior therapy for rx of LV mural thrombus. Consider switching to apixaban for ease of monitoring.  --Cardiology following; recc appreciated --Continue warfarin; consider transitioning to apixaban per cardio recc --Monitor PT/INR, repeat check  CAD HFrEF, EF 20% Last echo July 2022 with EF less  than 20% with severely decreased LV function and global hypokinesis of LV with severely dilated LV and grade 1 diastolic dysfunction. Home regimen includes: Lasix 40 mg daily, spironolactone 25 mg daily, losartan 12.5 mg daily, Coreg 25 mg twice daily. Pt was hypotensive on admission and home medications were held. Vitals stable though pressures are still soft SBP 90-110. No tachycardia noted. Pt appears euvolemic on exam. NO lower extremity edema  noted. --Continue to hold home medications stated above --Continue to monitor BP  Acute Kidney Injury Initial labs obtained in the ED significant for elevated Cr level at 1.45. Baseline ~1.1. Pt endorsed poor oral intake for the past week. Dehydration likely etiology of AKI. Pt is able to tolerate food/liquids. Due to hx of HFrEF with poor EF <20%, will avoid fluid hydration. No electrolyte imbalance noted.  --Encourage fluid intake --Daily BMP  Hypothyroidism 2/2 amiodarone TSH levels assessed on admission, 11 improved from 33 four weeks ago. Usually TSH are assessed at least 6 weeks after stopping offending agent. However, TSH levels have not worsened, which is reassuring. NO change in regimen indicated at this time. --Continue Synthroid 43mcg daily   Prior to Admission Living Arrangement: Anticipated Discharge Location: Barriers to Discharge: Dispo: Anticipated discharge in approximately 1-2 day(s).   Timothy Lasso, MD 06/28/2021, 8:55 AM Pager: [phone] After 5pm on weekdays and 1pm on weekends: On Call pager [phone]

## 2021-06-28 NOTE — Evaluation (Signed)
Physical Therapy Evaluation Patient Details Name: Joseph Carson MRN: [id number] DOB: May 29, 1955 Today's Date: 06/28/2021  History of Present Illness  Pt is a 66 y/o male presents 2/8 with 1 week hx of malaise, chills, SOB and productive cough. Found with community acquired PNA, AKI.  Per electrophysiology, ICD malfunction with plan for lifevest at dc. PMH includes: VT, ICD, afib, CAD s/p CABG 2014, CHF, HTN, LV mural thrombus, L BBB, hypothyroidism.  Clinical Impression  Pt was seen for mobility on hallway and for balance testing with high level skills.  Had good tolerance to move, with no drop in O2 sats, and HR was briefly up to 121 standing but immediately recovered.  Had a few PVC's with post gait ck, and no symptoms of any light headed feeling or other feeling of being off other than SOB at times with gait.  Pt is demonstrating a good tolerance to move, and awaiting his vest fitting to be able to go home.  PT will sign off for now as there are no mobility issues to address.         Recommendations for follow up therapy are one component of a multi-disciplinary discharge planning process, led by the attending physician.  Recommendations may be updated based on patient status, additional functional criteria and insurance authorization.  Follow Up Recommendations No PT follow up    Assistance Recommended at Discharge PRN  Patient can return home with the following  Assistance with cooking/housework    Equipment Recommendations None recommended by PT  Recommendations for Other Services       Functional Status Assessment Patient has not had a recent decline in their functional status     Precautions / Restrictions Precautions Precautions: Fall Restrictions Weight Bearing Restrictions: No      Mobility  Bed Mobility Overal bed mobility: Modified Independent                  Transfers Overall transfer level: Modified independent Equipment used: None                General transfer comment: using no UE support to stand    Ambulation/Gait Ambulation/Gait assistance: Supervision, Min guard Gait Distance (Feet): 120 Feet Assistive device: 1 person hand held assist Gait Pattern/deviations: Step-through pattern, Decreased stride length, Wide base of support Gait velocity: reduced' Gait velocity interpretation: <1.31 ft/sec, indicative of household ambulator Pre-gait activities: monitor vitals and balance ck General Gait Details: pt is walking with no AD but is mildly unsteady  Science writer    Modified Rankin (Stroke Patients Only)       Balance Overall balance assessment: Modified Independent                                           Pertinent Vitals/Pain Pain Assessment Pain Assessment: No/denies pain    Home Living Family/patient expects to be discharged to:: Private residence Living Arrangements: Children;Other (Comment) (grandson) Available Help at Discharge: Family;Available PRN/intermittently Type of Home: House Home Access: Level entry     Alternate Level Stairs-Number of Steps: 6 then 6` Home Layout: Two level Home Equipment: None      Prior Function Prior Level of Function : Independent/Modified Independent;Driving                     Hand  Dominance   Dominant Hand: Right    Extremity/Trunk Assessment   Upper Extremity Assessment Upper Extremity Assessment: Defer to OT evaluation    Lower Extremity Assessment Lower Extremity Assessment: Overall WFL for tasks assessed    Cervical / Trunk Assessment Cervical / Trunk Assessment: Normal  Communication   Communication: No difficulties  Cognition Arousal/Alertness: Awake/alert Behavior During Therapy: WFL for tasks assessed/performed Overall Cognitive Status: Within Functional Limits for tasks assessed                                          General Comments General comments  (skin integrity, edema, etc.): pt reviewed some high level balance skills with no outright LOB but did have a few sporadic PVC's after walking.  O2 sat 98% and HR was 80    Exercises     Assessment/Plan    PT Assessment Patient needs continued PT services  PT Problem List Cardiopulmonary status limiting activity;Decreased activity tolerance (PVC's after gait)       PT Treatment Interventions DME instruction;Gait training;Stair training;Functional mobility training;Therapeutic activities;Therapeutic exercise;Balance training;Neuromuscular re-education;Patient/family education    PT Goals (Current goals can be found in the Care Plan section)  Acute Rehab PT Goals Patient Stated Goal: to get home today PT Goal Formulation: All assessment and education complete, DC therapy    Frequency Min 3X/week     Co-evaluation               AM-PAC PT "6 Clicks" Mobility  Outcome Measure Help needed turning from your back to your side while in a flat bed without using bedrails?: None Help needed moving from lying on your back to sitting on the side of a flat bed without using bedrails?: None Help needed moving to and from a bed to a chair (including a wheelchair)?: None Help needed standing up from a chair using your arms (e.g., wheelchair or bedside chair)?: None Help needed to walk in hospital room?: None Help needed climbing 3-5 steps with a railing? : A Little 6 Click Score: 23    End of Session Equipment Utilized During Treatment: Gait belt Activity Tolerance: Patient tolerated treatment well Patient left: in bed;with call bell/phone within reach Nurse Communication: Mobility status PT Visit Diagnosis: Difficulty in walking, not elsewhere classified (R26.2)    Time: 1130-1150 PT Time Calculation (min) (ACUTE ONLY): 20 min   Charges:   PT Evaluation $PT Eval Moderate Complexity: 1 Mod         Ramond Dial 06/28/2021, 3:46 PM  Mee Hives, PT PhD Acute Rehab Dept.  Number: [place] and [place]

## 2021-06-28 NOTE — Evaluation (Signed)
Occupational Therapy Evaluation Patient Details Name: Joseph Carson MRN: [id number] DOB: [birth year] Today's Date: 06/28/2021   History of Present Illness Pt is a 66 y/o male presents with 1 week hx of malaise, chills, SOB and productive cough. Found with community acquired PNA, AKI.  Per electrophysiology, ICD malfunction with plan for lifevest at dc. PMH includes: VT, ICD, afib, CAD s/p CABG 2014, CHF, HTN.   Clinical Impression   PTA patient reports independent and driving.  Admitted for above and presenting near baseline at modified independent level for ADLs, mobility in room.  Pt educated on energy conservation techniques due to decreased activity tolerance for engagement in ADLs, IADLs and mobility.  Based on performance today, no further OT needs identified and OT will sign off.       Recommendations for follow up therapy are one component of a multi-disciplinary discharge planning process, led by the attending physician.  Recommendations may be updated based on patient status, additional functional criteria and insurance authorization.   Follow Up Recommendations  No OT follow up    Assistance Recommended at Discharge PRN  Patient can return home with the following      Functional Status Assessment     Equipment Recommendations  Tub/shower seat    Recommendations for Other Services       Precautions / Restrictions Precautions Precautions: Fall Restrictions Weight Bearing Restrictions: No      Mobility Bed Mobility Overal bed mobility: Modified Independent                  Transfers                          Balance Overall balance assessment: No apparent balance deficits (not formally assessed)                                         ADL either performed or assessed with clinical judgement   ADL Overall ADL's : Modified independent                                       General ADL Comments:  demonstrates ability to complete ADLS and moblity in room with modified indepednence     Vision Baseline Vision/History: 1 Wears glasses Vision Assessment?: No apparent visual deficits     Perception     Praxis      Pertinent Vitals/Pain Pain Assessment Pain Assessment: No/denies pain     Hand Dominance Right   Extremity/Trunk Assessment Upper Extremity Assessment Upper Extremity Assessment: Overall WFL for tasks assessed   Lower Extremity Assessment Lower Extremity Assessment: Defer to PT evaluation       Communication Communication Communication: No difficulties   Cognition Arousal/Alertness: Awake/alert Behavior During Therapy: WFL for tasks assessed/performed Overall Cognitive Status: Within Functional Limits for tasks assessed                                       General Comments  educated on energy conservation techniques and recommendations    Exercises     Shoulder Instructions      Home Living Family/patient expects to be discharged to:: Private residence Living Arrangements: Children (grandson) Available Help at  Discharge: Family;Available PRN/intermittently Type of Home: House Home Access: Level entry     Home Layout: Two level Alternate Level Stairs-Number of Steps: 6 then 6` Alternate Level Stairs-Rails:  (+ rail) Bathroom Shower/Tub: Chief Strategy Officer: Standard     Home Equipment: None          Prior Functioning/Environment Prior Level of Function : Independent/Modified Independent;Driving                        OT Problem List: Decreased activity tolerance;Decreased knowledge of use of DME or AE;Decreased knowledge of precautions;Cardiopulmonary status limiting activity      OT Treatment/Interventions:      OT Goals(Current goals can be found in the care plan section) Acute Rehab OT Goals Patient Stated Goal: to feel better OT Goal Formulation: With patient  OT Frequency:       Co-evaluation              AM-PAC OT "6 Clicks" Daily Activity     Outcome Measure Help from another person eating meals?: None Help from another person taking care of personal grooming?: None Help from another person toileting, which includes using toliet, bedpan, or urinal?: None Help from another person bathing (including washing, rinsing, drying)?: None Help from another person to put on and taking off regular upper body clothing?: None Help from another person to put on and taking off regular lower body clothing?: None 6 Click Score: 24   End of Session Nurse Communication: Mobility status  Activity Tolerance: Patient tolerated treatment well Patient left: in bed;with call bell/phone within reach  OT Visit Diagnosis: Other abnormalities of gait and mobility (R26.89)                Time: 8768-1157 OT Time Calculation (min): 15 min Charges:  OT General Charges $OT Visit: 1 Visit OT Evaluation $OT Eval Low Complexity: 1 Low  Barry Brunner, OT Acute Rehabilitation Services Pager [phone] Office [phone]   Chancy Milroy 06/28/2021, 12:50 PM

## 2021-06-29 LAB — PROTIME-INR
INR: 3.3 — ABNORMAL HIGH (ref 0.8–1.2)
Prothrombin Time: 33.7 seconds — ABNORMAL HIGH (ref 11.4–15.2)

## 2021-06-29 LAB — T3, FREE: T3, Free: 2.6 pg/mL (ref 2.0–4.4)

## 2021-06-29 NOTE — Discharge Instructions (Addendum)
If you begin to develop fever, chills, increased coughing please  do not hesitate to schedule app with PCP. Please take Augmentin for 2 days to complete antibiotic regimen.  Administer Warfarin 5 mg (whole tablet) on Tuesdays and Thursdays. Administer Warfarin 2.5 mg (half tablet) all other days of the week. Hold dose Friday, June 29, 2021 and begin administering doses per new schedule on Saturday, June 30, 2021.

## 2021-06-29 NOTE — Progress Notes (Signed)
° °  Subjective: I seen and evaluated [name] at bedside.  He states he is feeling fine.  Denies any complaints at this time.  Objective:  Vital signs in last 24 hours: Vitals:   06/28/21 2252 06/28/21 2326 06/29/21 0743 06/29/21 1133  BP: (!) 145/66 112/62 121/71 111/65  Pulse: 68 61 60 60  Resp: (!) 23 (!) 22 16 20   Temp: 98.2 F (36.8 C) 98.2 F (36.8 C) 97.6 F (36.4 C) 98.2 F (36.8 C)  TempSrc: Oral Oral Oral Oral  SpO2: 95% 94% 95% 95%  Weight:      Height:       Physical Exam Constitutional:      General: He is awake. He is not in acute distress. Cardiovascular:     Rate and Rhythm: Normal rate and regular rhythm.     Heart sounds: Normal heart sounds.  Pulmonary:     Effort: Pulmonary effort is normal.     Breath sounds: Normal breath sounds.  Skin:    General: Skin is warm and dry.  Neurological:     General: No focal deficit present.     Mental Status: He is alert.  Psychiatric:        Behavior: Behavior normal. Behavior is cooperative.     Assessment/Plan:  Principal Problem:   Community acquired pneumonia Active Problems:   HLD (hyperlipidemia)   Chronic systolic CHF (congestive heart failure) (HCC)   Biventricular automatic implantable cardioverter defibrillator in situ   Warfarin-induced coagulopathy ([place])  CAP Patient stable; continues to improve clinically.  Has not required oxygen since admission.  Overall patient feels well.  Lungs clear on exam auscultation.  PT/OT recommended outpatient follow-up. -- Continue 1 g IV ceftriaxone; day 3 of 5 and azithromycin 500 mg; day 3 of 5 --At discharge, switch to oral Augmentin  NSVT s/p ACID AICD Alarm Atrial fibrillation LV mural thrombus on Warfarin Cardiology recommends Life vest.  Patient will remain on warfarin.  INR continues to improve.  At time of discharge, warfarin dosing adjustment placed in AVS. --Continue warfarin --Monitor PTT/INR, repeat check  Hypothyroidism 2/2  amiodarone --Continue Synthroid 50 mcg daily  AKI, resolved   Prior to Admission Living Arrangement: Anticipated Discharge Location: Barriers to Discharge: Dispo: Anticipated discharge in approximately 1-2 day(s).   Timothy Lasso, MD 06/29/2021, 2:23 PM Pager: [phone] After 5pm on weekdays and 1pm on weekends: On Call pager [phone]

## 2021-06-29 NOTE — Progress Notes (Addendum)
I have touched base with Life Vest rep to ensure process in unerway for Life vest application. Dr. Ladona Ridgel follow up in the office in plac Life vest must be fitted/in place prior to discharge Patient is aware  Francis Dowse, PA-C  Spoke with Life vest rep, aware of planned discharge today, they are following up on order status and will get him fitted as soon as able Francis Dowse, PA-C

## 2021-06-29 NOTE — Progress Notes (Signed)
°  Transition of Care [facility]) Screening Note   Patient Details  Name: Joseph Carson Date of Birth: 01-Mar-1956   Transition of Care [facility]) CM/SW Contact:    Mearl Latin, LCSW Phone Number: 06/29/2021, 8:32 AM    Transition of Care Department [facility]) has reviewed patient and no TOC needs have been identified at this time. We will continue to monitor patient advancement through interdisciplinary progression rounds. If new patient transition needs arise, please place a TOC consult.

## 2021-06-29 NOTE — Plan of Care (Signed)
Joseph Carson is a 66 y.o. male patient. 1. Generalized weakness   2. Community acquired pneumonia, unspecified laterality   3. Elevated troponin    Past Medical History:  Diagnosis Date   AICD (automatic cardioverter/defibrillator) present    Atrial fibrillation, chronic (HCC)    CAD (coronary artery disease)    a. s/p CABG x 3 03/2013 (L-LAD, S-RI, S-PDA) in [place]   CAP (community acquired pneumonia) 08/22/2015   CHF (congestive heart failure) (HCC)    Chronic combined systolic and diastolic heart failure (HCC) November 2014   Coronary artery disease    Full dentures    HLD (hyperlipidemia)     Hyperbilirubinemia    Ischemic cardiomyopathy    a. echo (10/15):  EF 20%, diff HK, apical AK, mobile density inf wall near papillary muscle (?ruptured) with mod MR, mod reduced RVSF, mild PI, PASP 37 mmHg b. s/p CRTD    LBBB (left bundle branch block)     LV (left ventricular) mural thrombus     a. TEE (10/15):  EF 15-20%, septum and ant walls AK, lat wall HK, no papillary muscle rupture, probable small thrombus on ant/ant-septal wall, mild MR, mod decreased RVSF, neg bubble study   Mitral regurgitation     mild by TEE 02/2014   NSVT (nonsustained ventricular tachycardia)         Obesity    Pulmonary hypertension (HCC)    Umbilical hernia    "have had it since age [age]"   Umbilical hernia    Wears glasses    Current Facility-Administered Medications  Medication Dose Route Frequency Provider Last Rate Last Admin   acetaminophen (TYLENOL) tablet 650 mg  650 mg Oral Q6H PRN Evlyn Kanner, MD       Or   acetaminophen (TYLENOL) suppository 650 mg  650 mg Rectal Q6H PRN Evlyn Kanner, MD       azithromycin (ZITHROMAX) 500 mg in sodium chloride 0.9 % 250 mL IVPB  500 mg Intravenous Q24H Doran Stabler, DO   Stopped at 06/28/21 1605   cefTRIAXone (ROCEPHIN) 1 g in sodium chloride 0.9 % 100 mL IVPB  1 g Intravenous Q24H Doran Stabler, DO   Stopped at 06/28/21 1503    ipratropium-albuterol (DUONEB) 0.5-2.5 (3) MG/3ML nebulizer solution 3 mL  3 mL Nebulization Q4H PRN Evlyn Kanner, MD       levothyroxine (SYNTHROID) tablet 50 mcg  50 mcg Oral Q0600 Evlyn Kanner, MD   50 mcg at 06/28/21 0600   nicotine (NICODERM CQ - dosed in mg/24 hr) patch 7 mg  7 mg Transdermal Daily Evlyn Kanner, MD       Warfarin - Pharmacist Dosing Inpatient   Does not apply [id number] Mercie Eon, MD       No Known Allergies Principal Problem:   Community acquired pneumonia Active Problems:   HLD (hyperlipidemia)   Chronic systolic CHF (congestive heart failure) (HCC)   Biventricular automatic implantable cardioverter defibrillator in situ   Warfarin-induced coagulopathy (HCC)  Blood pressure 112/62, pulse 61, temperature 98.2 F (36.8 C), temperature source Oral, resp. rate (!) 22, height 5\' 10"  (1.778 m), weight 102.1 kg, SpO2 94 %.  Subjective Objective Assessment & Plan  06/29/2021

## 2021-06-29 NOTE — TOC Progression Note (Signed)
Transition of Care [facility]) - Progression Note    Patient Details  Name: Joseph Carson MRN: [id number] Date of Birth: 04-20-1956  Transition of Care [facility]) CM/SW Contact  Lockie Pares, RN Phone Number: 06/29/2021, 11:21 AM  Clinical Narrative:      Order for lifevest noted. Faxed all information H&P, order, last echo report, facesheet  to [facility] [phone]. Called in to patient room with no answer to discuss DC planning,       Expected Discharge Plan and Services                                                 Social Determinants of Health (SDOH) Interventions    Readmission Risk Interventions No flowsheet data found.

## 2021-06-29 NOTE — Progress Notes (Signed)
ANTICOAGULATION CONSULT NOTE - Follow Up Consult  Pharmacy Consult for Warfarin Indication:  LV thrombus  No Known Allergies  Patient Measurements: Height: 5\' 10"  (177.8 cm) Weight: 102.1 kg (225 lb) IBW/kg (Calculated) : 73  Vital Signs: Temp: 98.2 F (36.8 C) (02/10 1133) Temp Source: Oral (02/10 1133) BP: 111/65 (02/10 1133) Pulse Rate: 60 (02/10 1133)  Labs: Recent Labs    06/27/21 0819 06/27/21 1321 06/27/21 1830 06/27/21 2328 06/28/21 0609 06/28/21 1414 06/29/21 0146  HGB  --  14.8  --   --  13.0  --   --   HCT  --  44.7  --   --  39.5  --   --   PLT  --  202  --   --  189  --   --   LABPROT  --   --   --  37.6*  --  36.2* 33.7*  INR  --   --   --  3.8*  --  3.6* 3.3*  CREATININE 1.45*  --   --   --  1.06  --   --   TROPONINIHS 89* 20* 23*  --   --   --   --      Estimated Creatinine Clearance: 83.1 mL/min (by C-G formula based on SCr of 1.06 mg/dL).    Assessment: 66 y.o. male admitted with AMS/weakness, h/o LV thrombus, to continue Coumadin.  INR remains supratherapeutic at 3.3. Plan is to discharge patient today. INR has been gradually down-trending during this admission and has missed 3 doses during this admission. Per ambulatory care anticoagulation flowsheet, the patient has been following at a clinic for INR checks every 2 weeks, with most recent visit 06/19/21. Prior to that visit, his INRs had been subtherapeutic at ~1.8 and only minor dose adjustments were made during those visits. Per documentation from these visits, the patient was previously on amiodarone which was discontinued around his 05/22/21 visit. INR elevations from this visit are likely reflective of amiodarone washout. Plan is to decrease total weekly dose of warfarin by 5 mg with outpatient follow-up 07/10/21. Plan has been discussed with patient and he has been advised to hold his dose for today, 2/10, and take his next dose of warfarin tomorrow. INR will likely continue to downtrend and reflect  held doses from earlier in the week. Patient further advised to stay consistent with leafy green consumption and monitor for signs of clotting/bleeding events. Patient voiced understanding to all information discussed with him and is amenable to plan going forward. Please reference table below to see changes in warfarin dosing schedule.    Day  PTA Schedule   Actions Taken  New schedule   Monday  5 mg  PTA  2.5 mg   Tuesday  2.5 mg  PTA  5 mg   Wednesday  5 mg  INR 3.8- dose held 2/8 2.5 mg   Thursday  2.5 mg  INR 3.6- dose held 2/9 5 mg   Friday  5 mg  INR 3.3- hold dose 2/10 2.5 mg   Saturday  2.5 mg  Administer new dose 2/11 2.5 mg   Sunday  5 mg  Administer new dose 2/12  2.5 mg   Total Weekly Dose  27.5 mg  Administer new dose 2/13  22.5 mg      Goal of Therapy:  INR 2-3 Monitor platelets by anticoagulation protocol: Yes  Plan:  Administer Warfarin 5 mg (whole tablet) on Tuesdays and Thursdays. Administer Warfarin  2.5 mg (half tablet) all other days of the week. Hold dose Friday, June 29, 2021 and begin administering doses per new schedule on Saturday, June 30, 2021.    Adria Dill, PharmD PGY-1 Acute Care Resident  06/29/2021 12:22 PM

## 2021-06-29 NOTE — Progress Notes (Signed)
Physical Therapy Treatment Patient Details Name: Joseph Carson MRN: [id number] DOB: 17-Nov-1955 Today's Date: 06/29/2021   History of Present Illness Pt is a 66 y/o male presents 2/8 with 1 week hx of malaise, chills, SOB and productive cough. Found with community acquired PNA, AKI.  Per electrophysiology, ICD malfunction with plan for lifevest at dc. PMH includes: VT, ICD, afib, CAD s/p CABG 2014, CHF, HTN, LV mural thrombus, L BBB, hypothyroidism.    PT Comments    Pt demonstrates mod I bed mobility and transfers. Supervision ambulation 200' without AD and min guard assist ascend/descend a flight of stairs with R rail. Mobilized on RA with desat to 90%. SpO2 96% at rest on RA. 1/4 DOE during mobility. Required 2 standing rest breaks during amb/stairs due to fatigue and increased WOB. Pt returned to EOB at end of session.    Recommendations for follow up therapy are one component of a multi-disciplinary discharge planning process, led by the attending physician.  Recommendations may be updated based on patient status, additional functional criteria and insurance authorization.  Follow Up Recommendations  No PT follow up     Assistance Recommended at Discharge PRN  Patient can return home with the following Assistance with cooking/housework   Equipment Recommendations  None recommended by PT    Recommendations for Other Services       Precautions / Restrictions Precautions Precautions: Fall;Other (comment) Precaution Comments: watch sats     Mobility  Bed Mobility Overal bed mobility: Modified Independent                  Transfers Overall transfer level: Modified independent Equipment used: None                    Ambulation/Gait Ambulation/Gait assistance: Supervision Gait Distance (Feet): 200 Feet Assistive device: None Gait Pattern/deviations: Step-through pattern, Decreased stride length Gait velocity: decreased Gait velocity interpretation:  <1.31 ft/sec, indicative of household ambulator   General Gait Details: Mobilized on RA with SpO2 90%. 1/4 DOE. Standing rest break x 2   Stairs Stairs: Yes Stairs assistance: Min guard Stair Management: One rail Right, Forwards, Alternating pattern Number of Stairs: 12     Wheelchair Mobility    Modified Rankin (Stroke Patients Only)       Balance Overall balance assessment: Mild deficits observed, not formally tested                                          Cognition Arousal/Alertness: Awake/alert Behavior During Therapy: WFL for tasks assessed/performed Overall Cognitive Status: Within Functional Limits for tasks assessed                                          Exercises      General Comments        Pertinent Vitals/Pain Pain Assessment Pain Assessment: No/denies pain    Home Living                          Prior Function            PT Goals (current goals can now be found in the care plan section) Acute Rehab PT Goals Patient Stated Goal: home Progress towards PT goals: Progressing toward goals  Frequency    Min 3X/week      PT Plan Current plan remains appropriate    Co-evaluation              AM-PAC PT "6 Clicks" Mobility   Outcome Measure  Help needed turning from your back to your side while in a flat bed without using bedrails?: None Help needed moving from lying on your back to sitting on the side of a flat bed without using bedrails?: None Help needed moving to and from a bed to a chair (including a wheelchair)?: None Help needed standing up from a chair using your arms (e.g., wheelchair or bedside chair)?: None Help needed to walk in hospital room?: A Little Help needed climbing 3-5 steps with a railing? : A Little 6 Click Score: 22    End of Session Equipment Utilized During Treatment: Gait belt Activity Tolerance: Patient tolerated treatment well Patient left: in  bed;with call bell/phone within reach Nurse Communication: Mobility status PT Visit Diagnosis: Difficulty in walking, not elsewhere classified (R26.2)     Time: [id number] PT Time Calculation (min) (ACUTE ONLY): 16 min  Charges:  $Gait Training: 8-22 mins                     Lorrin Goodell, PT  Office # [phone] Pager [phone]    Joseph Carson 06/29/2021, 9:12 AM

## 2021-06-29 NOTE — Plan of Care (Signed)

## 2021-06-29 NOTE — Plan of Care (Signed)
°  Problem: Education: Goal: Knowledge of General Education information will improve Description: Including pain rating scale, medication(s)/side effects and non-pharmacologic comfort measures 06/29/2021 1442 by Fatima Blank D, RN Outcome: Progressing 06/29/2021 1132 by Fatima Blank D, RN Outcome: Progressing   Problem: Health Behavior/Discharge Planning: Goal: Ability to manage health-related needs will improve 06/29/2021 1442 by Fatima Blank D, RN Outcome: Progressing 06/29/2021 1132 by Fatima Blank D, RN Outcome: Progressing   Problem: Clinical Measurements: Goal: Ability to maintain clinical measurements within normal limits will improve 06/29/2021 1442 by Fatima Blank D, RN Outcome: Progressing 06/29/2021 1132 by Fatima Blank D, RN Outcome: Progressing Goal: Will remain free from infection 06/29/2021 1442 by Fatima Blank D, RN Outcome: Progressing 06/29/2021 1132 by Fatima Blank D, RN Outcome: Progressing Goal: Diagnostic test results will improve 06/29/2021 1442 by Fatima Blank D, RN Outcome: Progressing 06/29/2021 1132 by Fatima Blank D, RN Outcome: Progressing Goal: Respiratory complications will improve 06/29/2021 1442 by Fatima Blank D, RN Outcome: Progressing 06/29/2021 1132 by Fatima Blank D, RN Outcome: Progressing Goal: Cardiovascular complication will be avoided 06/29/2021 1442 by Fatima Blank D, RN Outcome: Progressing 06/29/2021 1132 by Fatima Blank D, RN Outcome: Progressing   Problem: Activity: Goal: Risk for activity intolerance will decrease 06/29/2021 1442 by Fatima Blank D, RN Outcome: Progressing 06/29/2021 1132 by Fatima Blank D, RN Outcome: Progressing   Problem: Nutrition: Goal: Adequate nutrition will be maintained 06/29/2021 1442 by Fatima Blank D, RN Outcome: Progressing 06/29/2021 1132 by Fatima Blank D, RN Outcome:  Progressing   Problem: Coping: Goal: Level of anxiety will decrease 06/29/2021 1442 by Fatima Blank D, RN Outcome: Progressing 06/29/2021 1132 by Fatima Blank D, RN Outcome: Progressing   Problem: Elimination: Goal: Will not experience complications related to bowel motility 06/29/2021 1442 by Fatima Blank D, RN Outcome: Progressing 06/29/2021 1132 by Fatima Blank D, RN Outcome: Progressing Goal: Will not experience complications related to urinary retention 06/29/2021 1442 by Fatima Blank D, RN Outcome: Progressing 06/29/2021 1132 by Fatima Blank D, RN Outcome: Progressing   Problem: Pain Managment: Goal: General experience of comfort will improve 06/29/2021 1442 by Fatima Blank D, RN Outcome: Progressing 06/29/2021 1132 by Fatima Blank D, RN Outcome: Progressing   Problem: Safety: Goal: Ability to remain free from injury will improve 06/29/2021 1442 by Fatima Blank D, RN Outcome: Progressing 06/29/2021 1132 by Fatima Blank D, RN Outcome: Progressing   Problem: Skin Integrity: Goal: Risk for impaired skin integrity will decrease 06/29/2021 1442 by Fatima Blank D, RN Outcome: Progressing 06/29/2021 1132 by Fatima Blank D, RN Outcome: Progressing   Problem: Activity: Goal: Ability to tolerate increased activity will improve 06/29/2021 1442 by Fatima Blank D, RN Outcome: Progressing 06/29/2021 1132 by Fatima Blank D, RN Outcome: Progressing   Problem: Clinical Measurements: Goal: Ability to maintain a body temperature in the normal range will improve 06/29/2021 1442 by Fatima Blank D, RN Outcome: Progressing 06/29/2021 1132 by Fatima Blank D, RN Outcome: Progressing   Problem: Respiratory: Goal: Ability to maintain adequate ventilation will improve 06/29/2021 1442 by Fatima Blank D, RN Outcome: Progressing 06/29/2021 1132 by Fatima Blank D, RN Outcome:  Progressing Goal: Ability to maintain a clear airway will improve 06/29/2021 1442 by Fatima Blank D, RN Outcome: Progressing 06/29/2021 1132 by Costella Hatcher, RN Outcome: Progressing

## 2021-06-29 NOTE — Care Management (Signed)
Joseph Carson, Joseph Carson [id number] ([name]) (66 y.o. M) (Adm: 06/27/21) [id number] PCP  MASTERS, KATIE Demographics Comment     Address  [address] [place] [address]   Home Phone  [phone]   Work Phone     Mobile Phone  [phone]         Social Security Number  [id number]   Insurance Information  [employer] MEDICARE   Marital Status  Divorced   Religion  None          Basic Information  Date Of Birth  [birth year] Gender Identity  Male Race  White or Caucasian Ethnic Group  Not Hispanic or Latino Preferred Language  English   Documents Filed to Patient  Power of Attorney Living Will Clinical Unknown Study Attachment Consent Form ABN Waiver After Visit Summary Lab Result Scan Code Status MyChart Status Advance Care Planning   Not on File  Not on File  Not on File  Not on File  Filed  Not on [place] [Updated on 06/27/21 2031]  Active (w/ proxy users) Jump to the Activity    Admission Information  Current Information  Attending Provider Admitting Provider Admission Type Admission Status  Lottie Mussel, MD Lottie Mussel, MD Emergency Confirmed Admission        Admission Date/Time Discharge Date Hospital Service Auth/Cert Status  99991111  08:01 AM  Acute Care Incomplete        Hospital Area Unit Room/Bed   [place] [facility] [facility] Account  Name Acct ID Class Status Primary Coverage  Fardeen, Treasure [id number] Inpatient Agency [place]        Guarantor Account (for Hospital Account [id number])  Name Relation to Pt Service Area Active? Acct Type  Charyl Bigger Self CHSA Yes Personal/Family  Address Phone    [address]  [place], [place] [address] [phone])          Coverage Information (for Hospital Account [id number])  F/O Payor/Plan Precert #  [facility] [place] #  Kempton, Tramell [id number]  [place]  [address] [address] [phone]         Care Everywhere ID:  [id number]

## 2021-06-30 ENCOUNTER — Other Ambulatory Visit: Payer: Self-pay | Admitting: Student

## 2021-06-30 LAB — PROTIME-INR
INR: 2.4 — ABNORMAL HIGH (ref 0.8–1.2)
Prothrombin Time: 26.3 seconds — ABNORMAL HIGH (ref 11.4–15.2)

## 2021-06-30 MED ORDER — AMOXICILLIN-POT CLAVULANATE 875-125 MG PO TABS: 1.0000 | ORAL_TABLET | Freq: Two times a day (BID) | ORAL | 0 refills | Status: AC

## 2021-06-30 MED ORDER — AMOXICILLIN-POT CLAVULANATE 875-125 MG PO TABS
1.0000 | ORAL_TABLET | Freq: Two times a day (BID) | ORAL | 0 refills | Status: DC
Start: 2021-06-30 — End: 2021-06-30

## 2021-06-30 NOTE — Plan of Care (Signed)

## 2021-06-30 NOTE — Progress Notes (Signed)
Rn at bedside. Patient appears calm and free of pain. Patient observed being fit for life vest by third party company. Patient tolerated well. IV monitoring removed; telemonitoring connections removed per policy. AVS handed to patient; all questions by patient answered by RN. Patient transported to discharge lobby.

## 2021-07-10 ENCOUNTER — Ambulatory Visit (INDEPENDENT_AMBULATORY_CARE_PROVIDER_SITE_OTHER): Payer: Medicare Other

## 2021-07-10 ENCOUNTER — Ambulatory Visit (INDEPENDENT_AMBULATORY_CARE_PROVIDER_SITE_OTHER): Payer: Medicare Other | Admitting: Internal Medicine

## 2021-07-10 ENCOUNTER — Encounter: Payer: Self-pay | Admitting: Internal Medicine

## 2021-07-10 ENCOUNTER — Other Ambulatory Visit: Payer: Self-pay

## 2021-07-10 ENCOUNTER — Encounter: Payer: Medicare Other | Admitting: Internal Medicine

## 2021-07-10 VITALS — BP 102/60 | HR 60 | Ht 70.0 in | Wt 216.6 lb

## 2021-07-10 DIAGNOSIS — I4819 Other persistent atrial fibrillation: Secondary | ICD-10-CM | POA: Diagnosis not present

## 2021-07-10 DIAGNOSIS — I5022 Chronic systolic (congestive) heart failure: Secondary | ICD-10-CM | POA: Diagnosis not present

## 2021-07-10 DIAGNOSIS — I2510 Atherosclerotic heart disease of native coronary artery without angina pectoris: Secondary | ICD-10-CM | POA: Diagnosis not present

## 2021-07-10 DIAGNOSIS — Z95810 Presence of automatic (implantable) cardiac defibrillator: Secondary | ICD-10-CM | POA: Diagnosis not present

## 2021-07-10 DIAGNOSIS — T82198A Other mechanical complication of other cardiac electronic device, initial encounter: Secondary | ICD-10-CM

## 2021-07-10 DIAGNOSIS — Z5181 Encounter for therapeutic drug level monitoring: Secondary | ICD-10-CM | POA: Diagnosis not present

## 2021-07-10 LAB — POCT INR: INR: 2.1 (ref 2.0–3.0)

## 2021-07-10 NOTE — Patient Instructions (Signed)
Description   START taking  0.5 tablet daily except 1 tablet on Sunday, Monday, Wednesday, and Fridays.   Recheck INR in 3 weeks. Call [facility] if placed on any new medications or scheduled for any procedures - [phone];   Amio discontinued.

## 2021-07-10 NOTE — Progress Notes (Signed)
HPI Joseph Carson returns today for ongoing evaluation. He is a pleasant 66 yo man with a h/o VT/VF, chronic systolic heart failure and sinus node dysfunction. He underwent ICD lead extraction over 2 years ago and unfortunately developed lead failure with recurrent episodes of HV impedence elevations. He has not had chest pain. He has class 2 CHF symptoms. He is wearing a life vest and today we turned off his tachy therapies.  No Known Allergies   Current Outpatient Medications  Medication Sig Dispense Refill   acetaminophen (TYLENOL) 325 MG tablet Take 1-2 tablets (325-650 mg total) by mouth every 4 (four) hours as needed for mild pain.     carvedilol (COREG) 25 MG tablet Take 1 tablet (25 mg total) by mouth 2 (two) times daily with a meal. 180 tablet 1   furosemide (LASIX) 40 MG tablet TAKE 1 TABLET BY MOUTH DAILY (Patient taking differently: Take 40 mg by mouth daily.) 90 tablet 3   levothyroxine (SYNTHROID) 50 MCG tablet Take 1 tablet (50 mcg total) by mouth daily before breakfast. 90 tablet 1   losartan (COZAAR) 25 MG tablet Take 0.5 tablets (12.5 mg total) by mouth daily. 45 tablet 1   Multiple Vitamins-Minerals (HAIR/SKIN/NAILS PO) Take 1 tablet by mouth daily.     spironolactone (ALDACTONE) 25 MG tablet TAKE 1 TABLET BY MOUTH DAILY. (Patient taking differently: Take 25 mg by mouth daily.) 90 tablet 3   warfarin (COUMADIN) 5 MG tablet TAKE 1/2 TO 1 TABLET BY MOUTH DAILY AS DIRECTED BY COUMADIN CLINIC (Patient taking differently: Take 2.5-5 mg by mouth daily at 4 PM. Take one 5 mg  tablet by mouth on Tuesday, Wednesday, Thursday and Saturdays per patient. Then take 2.5 mg rest of days) 90 tablet 1   No current facility-administered medications for this visit.     Past Medical History:  Diagnosis Date   AICD (automatic cardioverter/defibrillator) present    Atrial fibrillation, chronic (HCC)    CAD (coronary artery disease)    a. s/p CABG x 3 03/2013 (L-LAD, S-RI, S-PDA) in [place]    CAP (community acquired pneumonia) 08/22/2015   CHF (congestive heart failure) (HCC)    Chronic combined systolic and diastolic heart failure ([place]) November 2014   Coronary artery disease    Full dentures    HLD (hyperlipidemia)     Hyperbilirubinemia    Ischemic cardiomyopathy    a. echo (10/15):  EF 20%, diff HK, apical AK, mobile density inf wall near papillary muscle (?ruptured) with mod MR, mod reduced RVSF, mild PI, PASP 37 mmHg b. s/p CRTD    LBBB (left bundle branch block)     LV (left ventricular) mural thrombus     a. TEE (10/15):  EF 15-20%, septum and ant walls AK, lat wall HK, no papillary muscle rupture, probable small thrombus on ant/ant-septal wall, mild MR, mod decreased RVSF, neg bubble study   Mitral regurgitation     mild by TEE 02/2014   NSVT (nonsustained ventricular tachycardia)         Obesity    Pulmonary hypertension ([place])    Umbilical hernia    "have had it since age [id number]"   Umbilical hernia    Wears glasses     ROS:   All systems reviewed and negative except as noted in the HPI.   Past Surgical History:  Procedure Laterality Date   BI-VENTRICULAR IMPLANTABLE CARDIOVERTER DEFIBRILLATOR N/A 09/01/2014   MDT CRTD implanted by Dr Lovena Le  CARDIAC CATHETERIZATION  03/2013   CARDIOVERSION N/A 12/04/2017   Procedure: CARDIOVERSION;  Surgeon: Evans Lance, MD;  Location: [place] [facility];  Service: Cardiovascular;  Laterality: N/A;   CORONARY ARTERY BYPASS GRAFT  03/2013   LIMA to the LAD, SVG to the ramus, SVG to the PDA   CORONARY ARTERY BYPASS GRAFT  2014   CABG "X3"   FRACTURE SURGERY     ICD LEAD REMOVAL N/A 08/14/2018   Procedure: ICD LEAD REMOVAL;  Surgeon: Evans Lance, MD;  Location: [place];  Service: Cardiovascular;  Laterality: N/A;   INSERT / REPLACE / REMOVE PACEMAKER  2016   LEAD INSERTION N/A 08/14/2018   Procedure: LEAD INSERTION;  Surgeon: Evans Lance, MD;  Location: [place];  Service: Cardiovascular;  Laterality: N/A;   LEAD  REVISION/REPAIR N/A 12/04/2017   Procedure: LEAD REVISION/REPAIR;  Surgeon: Evans Lance, MD;  Location: [place] [facility];  Service: Cardiovascular;  Laterality: N/A;   MULTIPLE TOOTH EXTRACTIONS     PATELLA FRACTURE SURGERY Left 1972   TEE WITHOUT CARDIOVERSION N/A 03/04/2014   Procedure: TRANSESOPHAGEAL ECHOCARDIOGRAM (TEE);  Surgeon: Larey Dresser, MD;  Location: [place];  Service: Cardiovascular;  Laterality: N/A;   TEE WITHOUT CARDIOVERSION N/A 12/02/2017   Procedure: TRANSESOPHAGEAL ECHOCARDIOGRAM (TEE);  Surgeon: Buford Dresser, MD;  Location: [facility];  Service: Cardiovascular;  Laterality: N/A;     Family History  Problem Relation Age of Onset   Diabetes Mother        Deceased   Thyroid disease Mother      Social History   Socioeconomic History   Marital status: Divorced    Spouse name: Not on file   Number of children: Not on file   Years of education: Not on file   Highest education level: Not on file  Occupational History   Not on file  Tobacco Use   Smoking status: Every Day    Packs/day: 0.25    Years: 47.00    Pack years: 11.75    Types: Cigarettes   Smokeless tobacco: Never   Tobacco comments:    3-4 per day   Vaping Use   Vaping Use: Never used  Substance and Sexual Activity   Alcohol use: Not Currently    Alcohol/week: 0.0 standard drinks    Comment: "quit drinking when I was 23"   Drug use: Yes    Types: Marijuana    Comment:  "weekly" last use week of 08/07/2018   Sexual activity: Not Currently  Other Topics Concern   Not on file  Social History Narrative   ** Merged History Encounter **       Social Determinants of Health   Financial Resource Strain: Not on file  Food Insecurity: Not on file  Transportation Needs: Not on file  Physical Activity: Not on file  Stress: Not on file  Social Connections: Not on file  Intimate Partner Violence: Not on file     BP 102/60    Pulse 60    Ht 5\' 10"  (1.778 m)    Wt 216 lb  9.6 oz (98.2 kg)    SpO2 92%    BMI 31.08 kg/m   Physical Exam:  Well appearing NAD HEENT: Unremarkable Neck:  No JVD, no thyromegally Lymphatics:  No adenopathy Back:  No CVA tenderness Lungs:  Clear with no wheezes HEART:  Regular rate rhythm, no murmurs, no rubs, no clicks Abd:  soft, positive bowel sounds, no organomegally, no rebound,  no guarding Ext:  2 plus pulses, no edema, no cyanosis, no clubbing Skin:  No rashes no nodules Neuro:  CN II through XII intact, motor grossly intact  DEVICE  Normal device function.  See PaceArt for details. Elevated HV impedence  Assess/Plan:  ICD lead failure - he has broken 2 Medtronic ICD leads. We discussed the treatment options. I have recommended we try and place a new Non-medtronic lead and this will be scheduled in the coming weeks.  Chronic systolic heart failure - his symptoms are class 2.  VT - he has his tachy therapies turned off. I have encouraged him to wear his life vest. 4. PAF - he will continue his current meds. He will hold warfarin for 2 days prior to extraction.   Carleene Overlie Alfreida Steffenhagen,MD

## 2021-07-10 NOTE — Patient Instructions (Addendum)
Medication Instructions:  Your physician recommends that you continue on your current medications as directed. Please refer to the Current Medication list given to you today.  Labwork: None ordered.  Testing/Procedures: None ordered.  Follow-Up:  SEE INSTRUCTION LETTER  Remote monitoring is used to monitor your ICD from home. This monitoring reduces the number of office visits required to check your device to one time per year. It allows [place] to keep an eye on the functioning of your device to ensure it is working properly. You are scheduled for a device check from home on 08/30/2021. You may send your transmission at any time that day. If you have a wireless device, the transmission will be sent automatically. After your physician reviews your transmission, you will receive a postcard with your next transmission date.  Any Other Special Instructions Will Be Listed Below (If Applicable).  If you need a refill on your cardiac medications before your next appointment, please call your pharmacy.   Fractured Pacemaker Lead Replacement The most common cause of a pacemaker malfunction is a broken (fractured) lead. Leads, also called electrodes, are made of one or two wires that are separated and covered with insulation. These wires deliver energy to the heart. The wires or the insulation can break, the contact point in the heart can stop responding to the spark from the pacemaker, or an infection can occur. When this happens, the lead must be replaced. Tell a health care provider about: Any allergies you have. All medicines you are taking, including vitamins, herbs, eye drops, creams, and over-the-counter medicines. Any problems you or family members have had with anesthetic medicines. Any blood disorders you have. Any surgeries you have had. Any medical conditions you have. Whether you are pregnant or may be pregnant. What are the risks? Generally, this is a safe procedure. However, problems  may occur, including: Allergic reactions to medicines or dyes. Infection. Bleeding. Damage to nearby structures or organs. Piercing (puncturing) a lung. Another lead malfunction. Fluid buildup around the heart or lungs. Blood clots in the vein that is used for the procedure. What happens before the procedure? Staying hydrated Follow instructions from your health care provider about hydration, which may include: Up to 2 hours before the procedure - you may continue to drink clear liquids, such as water, clear fruit juice, black coffee, and plain tea.  Eating and drinking restrictions Follow instructions from your health care provider about eating and drinking, which may include: 8 hours before the procedure - stop eating heavy meals or foods, such as meat, fried foods, or fatty foods. 6 hours before the procedure - stop eating light meals or foods, such as toast or cereal. 6 hours before the procedure - stop drinking milk or drinks that contain milk. 2 hours before the procedure - stop drinking clear liquids. Medicines Ask your health care provider about: Changing or stopping your regular medicines. This is especially important if you take diabetes medicines or blood thinners. Taking medicines such as aspirin and ibuprofen. These medicines can thin your blood. Do not take these medicines unless your health care provider tells you to take them. Taking over-the-counter medicines, vitamins, herbs, and supplements. Tests You may have an exam or testing, such as blood tests or urine tests. You may have a chest X-ray. Your pacemaker will be evaluated. It may be checked in-person using a specific type of magnet, or it may be checked remotely with a computer. Surgery safety Ask your health care provider: How your surgery site  will be marked. What steps will be taken to help prevent infection. These may include: Removing hair at the surgery site. Washing skin with a germ-killing soap. Taking  antibiotic medicine. General instructions Do not use any products that contain nicotine or tobacco for at least 4 weeks before the procedure. These products include cigarettes, e-cigarettes, and chewing tobacco. If you need help quitting, ask your health care provider. You may be asked to shower with a germ-killing soap the night before or the morning of the procedure. Plan to have someone take you home from the hospital or clinic. It is common to have to spend the night in the hospital after this procedure. If you will be going home right after the procedure, plan to have someone with you for 24 hours. What happens during the procedure? An IV will be inserted into one of your veins. You will be given one or more of the following: A medicine to help you relax (sedative). A medicine to numb the area (local anesthetic). A medicine to make you fall asleep (general anesthetic). An incision will be made in your chest, typically at the scar from the pacemaker implantation surgery. The fractured lead will be unscrewed from the pacemaker. There is usually an effort to remove the broken lead. Sometimes this is too difficult or dangerous, so the fractured lead may be left in place. A new lead will be moved through a vein with the use of X-ray images. The lead tip will be positioned into the heart muscle. The other end of the lead will be attached to the generator. Your incision will be closed with stitches (sutures), skin glue, or germ-free (sterile) adhesive strips. A bandage (dressing) may be applied over the incision site. The procedure may vary among health care providers and hospitals. What happens after the procedure? Your blood pressure, heart rate, breathing rate, and blood oxygen level will be monitored until you leave the hospital or clinic. You may have chest X-rays to check the new leads and ensure that no complications have occurred. You will have your pacemaker evaluated to ensure it is  functioning properly. If you were given a sedative during the procedure, it can affect you for several hours. Do not drive or operate machinery until your health care provider says that it is safe. Do not raise the arm on the side of your procedure higher than your shoulder for 6 weeks, or as long as directed by your health care provider. Except for this restriction, continue to use your arm as normal to prevent problems. Summary The most common cause of a pacemaker malfunction is a broken (fractured) lead, which must be replaced. Before the procedure, ask your health care provider about changing or stopping your normal medicines, especially any blood thinning medicines. Do not raise the arm on the side of your procedure higher than your shoulder for 6 weeks, or as long as directed by your health care provider. Except for this restriction, continue to use your arm as normal to prevent problems. This information is not intended to replace advice given to you by your health care provider. Make sure you discuss any questions you have with your health care provider. Document Revised: 03/17/2019 Document Reviewed: 03/17/2019 Elsevier Patient Education  2022 [employer].

## 2021-07-10 NOTE — H&P (View-Only) (Signed)
HPI Mr. Modesto returns today for ongoing evaluation. He is a pleasant 66 yo man with a h/o VT/VF, chronic systolic heart failure and sinus node dysfunction. He underwent ICD lead extraction over 2 years ago and unfortunately developed lead failure with recurrent episodes of HV impedence elevations. He has not had chest pain. He has class 2 CHF symptoms. He is wearing a life vest and today we turned off his tachy therapies.  No Known Allergies   Current Outpatient Medications  Medication Sig Dispense Refill   acetaminophen (TYLENOL) 325 MG tablet Take 1-2 tablets (325-650 mg total) by mouth every 4 (four) hours as needed for mild pain.     carvedilol (COREG) 25 MG tablet Take 1 tablet (25 mg total) by mouth 2 (two) times daily with a meal. 180 tablet 1   furosemide (LASIX) 40 MG tablet TAKE 1 TABLET BY MOUTH DAILY (Patient taking differently: Take 40 mg by mouth daily.) 90 tablet 3   levothyroxine (SYNTHROID) 50 MCG tablet Take 1 tablet (50 mcg total) by mouth daily before breakfast. 90 tablet 1   losartan (COZAAR) 25 MG tablet Take 0.5 tablets (12.5 mg total) by mouth daily. 45 tablet 1   Multiple Vitamins-Minerals (HAIR/SKIN/NAILS PO) Take 1 tablet by mouth daily.     spironolactone (ALDACTONE) 25 MG tablet TAKE 1 TABLET BY MOUTH DAILY. (Patient taking differently: Take 25 mg by mouth daily.) 90 tablet 3   warfarin (COUMADIN) 5 MG tablet TAKE 1/2 TO 1 TABLET BY MOUTH DAILY AS DIRECTED BY COUMADIN CLINIC (Patient taking differently: Take 2.5-5 mg by mouth daily at 4 PM. Take one 5 mg  tablet by mouth on Tuesday, Wednesday, Thursday and Saturdays per patient. Then take 2.5 mg rest of days) 90 tablet 1   No current facility-administered medications for this visit.     Past Medical History:  Diagnosis Date   AICD (automatic cardioverter/defibrillator) present    Atrial fibrillation, chronic (HCC)    CAD (coronary artery disease)    a. s/p CABG x 3 03/2013 (L-LAD, S-RI, S-PDA) in [place]    CAP (community acquired pneumonia) 08/22/2015   CHF (congestive heart failure) (HCC)    Chronic combined systolic and diastolic heart failure ([place]) November 2014   Coronary artery disease    Full dentures    HLD (hyperlipidemia)     Hyperbilirubinemia    Ischemic cardiomyopathy    a. echo (10/15):  EF 20%, diff HK, apical AK, mobile density inf wall near papillary muscle (?ruptured) with mod MR, mod reduced RVSF, mild PI, PASP 37 mmHg b. s/p CRTD    LBBB (left bundle branch block)     LV (left ventricular) mural thrombus     a. TEE (10/15):  EF 15-20%, septum and ant walls AK, lat wall HK, no papillary muscle rupture, probable small thrombus on ant/ant-septal wall, mild MR, mod decreased RVSF, neg bubble study   Mitral regurgitation     mild by TEE 02/2014   NSVT (nonsustained ventricular tachycardia)         Obesity    Pulmonary hypertension ([place])    Umbilical hernia    "have had it since age [id number]"   Umbilical hernia    Wears glasses     ROS:   All systems reviewed and negative except as noted in the HPI.   Past Surgical History:  Procedure Laterality Date   BI-VENTRICULAR IMPLANTABLE CARDIOVERTER DEFIBRILLATOR N/A 09/01/2014   MDT CRTD implanted by Dr Lovena Le  CARDIAC CATHETERIZATION  03/2013   CARDIOVERSION N/A 12/04/2017   Procedure: CARDIOVERSION;  Surgeon: Evans Lance, MD;  Location: [place] [facility];  Service: Cardiovascular;  Laterality: N/A;   CORONARY ARTERY BYPASS GRAFT  03/2013   LIMA to the LAD, SVG to the ramus, SVG to the PDA   CORONARY ARTERY BYPASS GRAFT  2014   CABG "X3"   FRACTURE SURGERY     ICD LEAD REMOVAL N/A 08/14/2018   Procedure: ICD LEAD REMOVAL;  Surgeon: Evans Lance, MD;  Location: [place];  Service: Cardiovascular;  Laterality: N/A;   INSERT / REPLACE / REMOVE PACEMAKER  2016   LEAD INSERTION N/A 08/14/2018   Procedure: LEAD INSERTION;  Surgeon: Evans Lance, MD;  Location: [place];  Service: Cardiovascular;  Laterality: N/A;   LEAD  REVISION/REPAIR N/A 12/04/2017   Procedure: LEAD REVISION/REPAIR;  Surgeon: Evans Lance, MD;  Location: [place] [facility];  Service: Cardiovascular;  Laterality: N/A;   MULTIPLE TOOTH EXTRACTIONS     PATELLA FRACTURE SURGERY Left 1972   TEE WITHOUT CARDIOVERSION N/A 03/04/2014   Procedure: TRANSESOPHAGEAL ECHOCARDIOGRAM (TEE);  Surgeon: Larey Dresser, MD;  Location: [place];  Service: Cardiovascular;  Laterality: N/A;   TEE WITHOUT CARDIOVERSION N/A 12/02/2017   Procedure: TRANSESOPHAGEAL ECHOCARDIOGRAM (TEE);  Surgeon: Buford Dresser, MD;  Location: [facility] ENDOSCOPY;  Service: Cardiovascular;  Laterality: N/A;     Family History  Problem Relation Age of Onset   Diabetes Mother        Deceased   Thyroid disease Mother      Social History   Socioeconomic History   Marital status: Divorced    Spouse name: Not on file   Number of children: Not on file   Years of education: Not on file   Highest education level: Not on file  Occupational History   Not on file  Tobacco Use   Smoking status: Every Day    Packs/day: 0.25    Years: 47.00    Pack years: 11.75    Types: Cigarettes   Smokeless tobacco: Never   Tobacco comments:    3-4 per day   Vaping Use   Vaping Use: Never used  Substance and Sexual Activity   Alcohol use: Not Currently    Alcohol/week: 0.0 standard drinks    Comment: "quit drinking when I was 23"   Drug use: Yes    Types: Marijuana    Comment:  "weekly" last use week of 08/07/2018   Sexual activity: Not Currently  Other Topics Concern   Not on file  Social History Narrative   ** Merged History Encounter **       Social Determinants of Health   Financial Resource Strain: Not on file  Food Insecurity: Not on file  Transportation Needs: Not on file  Physical Activity: Not on file  Stress: Not on file  Social Connections: Not on file  Intimate Partner Violence: Not on file     BP 102/60    Pulse 60    Ht 5\' 10"  (1.778 m)    Wt 216 lb  9.6 oz (98.2 kg)    SpO2 92%    BMI 31.08 kg/m   Physical Exam:  Well appearing NAD HEENT: Unremarkable Neck:  No JVD, no thyromegally Lymphatics:  No adenopathy Back:  No CVA tenderness Lungs:  Clear with no wheezes HEART:  Regular rate rhythm, no murmurs, no rubs, no clicks Abd:  soft, positive bowel sounds, no organomegally, no rebound,  no guarding Ext:  2 plus pulses, no edema, no cyanosis, no clubbing Skin:  No rashes no nodules Neuro:  CN II through XII intact, motor grossly intact  DEVICE  Normal device function.  See PaceArt for details. Elevated HV impedence  Assess/Plan:  ICD lead failure - he has broken 2 Medtronic ICD leads. We discussed the treatment options. I have recommended we try and place a new Non-medtronic lead and this will be scheduled in the coming weeks.  Chronic systolic heart failure - his symptoms are class 2.  VT - he has his tachy therapies turned off. I have encouraged him to wear his life vest. 4. PAF - he will continue his current meds. He will hold warfarin for 2 days prior to extraction.   Carleene Overlie Khaleb Broz,MD

## 2021-07-16 NOTE — Progress Notes (Signed)
? ? ?Subjective:  ?CC: recheck TSH ? ?HPI: ? ?[name] is a 66 y.o. male with a past medical history stated below and presents today for hypothyroidism. Please see problem based assessment and plan for additional details. ? ?Past Medical History:  ?Diagnosis Date  ? AICD (automatic cardioverter/defibrillator) present   ? Atrial fibrillation, chronic ([place])   ? CAD (coronary artery disease)   ? a. s/p CABG x 3 03/2013 (L-LAD, S-RI, S-PDA) in [place]  ? CAP (community acquired pneumonia) 08/22/2015  ? CHF (congestive heart failure) ([place])   ? Chronic combined systolic and diastolic heart failure [facility]) November 2014  ? Coronary artery disease   ? Full dentures   ? HLD (hyperlipidemia)    ? Hyperbilirubinemia   ? Ischemic cardiomyopathy   ? a. echo (10/15):  EF 20%, diff HK, apical AK, mobile density inf wall near papillary muscle (?ruptured) with mod MR, mod reduced RVSF, mild PI, PASP 37 mmHg b. s/p CRTD   ? LBBB (left bundle branch block)    ? LV (left ventricular) mural thrombus    ? a. TEE (10/15):  EF 15-20%, septum and ant walls AK, lat wall HK, no papillary muscle rupture, probable small thrombus on ant/ant-septal wall, mild MR, mod decreased RVSF, neg bubble study  ? Mitral regurgitation    ? mild by TEE 02/2014  ? NSVT (nonsustained ventricular tachycardia)    ?    ? Obesity   ? Pulmonary hypertension ([place])   ? Umbilical hernia   ? "have had it since age 62/24"  ? Umbilical hernia   ? Wears glasses   ? ? ?Current Outpatient Medications on File Prior to Visit  ?Medication Sig Dispense Refill  ? acetaminophen (TYLENOL) 325 MG tablet Take 1-2 tablets (325-650 mg total) by mouth every 4 (four) hours as needed for mild pain.    ? carvedilol (COREG) 25 MG tablet Take 1 tablet (25 mg total) by mouth 2 (two) times daily with a meal. 180 tablet 1  ? furosemide (LASIX) 40 MG tablet TAKE 1 TABLET BY MOUTH DAILY (Patient taking differently: Take 40 mg by mouth daily.) 90 tablet 3  ? levothyroxine (SYNTHROID) 50  MCG tablet Take 1 tablet (50 mcg total) by mouth daily before breakfast. 90 tablet 1  ? losartan (COZAAR) 25 MG tablet Take 0.5 tablets (12.5 mg total) by mouth daily. 45 tablet 1  ? Multiple Vitamins-Minerals (HAIR/SKIN/NAILS PO) Take 1 tablet by mouth daily.    ? spironolactone (ALDACTONE) 25 MG tablet TAKE 1 TABLET BY MOUTH DAILY. (Patient taking differently: Take 25 mg by mouth daily.) 90 tablet 3  ? warfarin (COUMADIN) 5 MG tablet TAKE 1/2 TO 1 TABLET BY MOUTH DAILY AS DIRECTED BY COUMADIN CLINIC (Patient taking differently: Take 2.5-5 mg by mouth daily at 4 PM. Take one 5 mg  tablet by mouth on Tuesday, Wednesday, Thursday and Saturdays per patient. Then take 2.5 mg rest of days) 90 tablet 1  ? ?No current facility-administered medications on file prior to visit.  ? ? ?Family History  ?Problem Relation Age of Onset  ? Diabetes Mother   ?     Deceased  ? Thyroid disease Mother   ? ? ?Social History  ? ?Socioeconomic History  ? Marital status: Divorced  ?  Spouse name: Not on file  ? Number of children: Not on file  ? Years of education: Not on file  ? Highest education level: Not on file  ?Occupational History  ? Not  on file  ?Tobacco Use  ? Smoking status: Every Day  ?  Packs/day: 0.25  ?  Years: 47.00  ?  Pack years: 11.75  ?  Types: Cigarettes  ? Smokeless tobacco: Never  ? Tobacco comments:  ?  3-4 per day   ?Vaping Use  ? Vaping Use: Never used  ?Substance and Sexual Activity  ? Alcohol use: Not Currently  ?  Alcohol/week: 0.0 standard drinks  ?  Comment: "quit drinking when I was 23"  ? Drug use: Yes  ?  Types: Marijuana  ?  Comment:  "weekly" last use week of 08/07/2018  ? Sexual activity: Not Currently  ?Other Topics Concern  ? Not on file  ?Social History Narrative  ? ** Merged History Encounter **  ?    ? ?Social Determinants of Health  ? ?Financial Resource Strain: Not on file  ?Food Insecurity: Not on file  ?Transportation Needs: Not on file  ?Physical Activity: Not on file  ?Stress: Not on file   ?Social Connections: Not on file  ?Intimate Partner Violence: Not on file  ? ? ?Review of Systems: ?ROS negative except for what is noted on the assessment and plan. ? ?Objective:  ? ?Vitals:  ? 07/18/21 0927  ?BP: (!) 105/56  ?Pulse: (!) 59  ?Temp: 97.9 ?F (36.6 ?C)  ?TempSrc: Oral  ?SpO2: 97%  ?Weight: 214 lb 3.2 oz (97.2 kg)  ? ? ?Physical Exam: ?Gen: A&O x3 and in no apparent distress, well appearing and nourished. ?HEENT:  ?  Eye - visual acuity grossly intact, glasses in place, conjunctiva clear, sclera non-icteric, EOM intact.  ?  Mouth - No obvious caries or periodontal disease. ?Neck: no masses or nodules, AROM intact. ?CV: RRR, no murmurs, S1/S2 presents  ?Resp: Clear to ascultation bilaterally  ?Abd: BS (+) x4, soft, non-tender abdomen, without hepatosplenomegaly or masses ?MSK: Grossly normal AROM and strength x4 extremities. ?Skin: good skin turgor, no rashes, unusual bruising, or prominent lesions.  ?Neuro: No focal deficits, grossly normal sensation and coordination.  ?Psych: Oriented x3 and responding appropriately. Intact memory, normal mood, judgement, affect, and insight.  ? ? ?Assessment & Plan:  ?See Encounters Tab for problem based charting. ? ?Patient discussed with Dr. Evette Doffing ? ? ?[name], D.O. ?[facility]  PGY-1 ?Pager: [phone]  Phone: [phone] ?Date 07/19/2021  Time 1:22 PM  ?

## 2021-07-18 ENCOUNTER — Ambulatory Visit (INDEPENDENT_AMBULATORY_CARE_PROVIDER_SITE_OTHER): Payer: Medicare Other | Admitting: Internal Medicine

## 2021-07-18 ENCOUNTER — Telehealth: Payer: Self-pay

## 2021-07-18 VITALS — BP 105/56 | HR 59 | Temp 97.9°F | Wt 214.2 lb

## 2021-07-18 DIAGNOSIS — E039 Hypothyroidism, unspecified: Secondary | ICD-10-CM | POA: Diagnosis not present

## 2021-07-18 LAB — CUP PACEART INCLINIC DEVICE CHECK
Battery Remaining Longevity: 26 mo
Battery Voltage: 2.94 V
Brady Statistic AP VP Percent: 79.95 %
Brady Statistic AP VS Percent: 0.38 %
Brady Statistic AS VP Percent: 17.55 %
Brady Statistic AS VS Percent: 2.12 %
Brady Statistic RA Percent Paced: 78.72 %
Brady Statistic RV Percent Paced: 95.49 %
Date Time Interrogation Session: 20230221111000
HighPow Impedance: 238 Ohm
Implantable Lead Implant Date: 20160414
Implantable Lead Implant Date: 20160414
Implantable Lead Implant Date: 20200327
Implantable Lead Location: 753858
Implantable Lead Location: 753859
Implantable Lead Location: 753860
Implantable Lead Model: 4398
Implantable Lead Model: 5076
Implantable Lead Model: 6935
Implantable Pulse Generator Implant Date: 20200327
Lead Channel Impedance Value: 216.848
Lead Channel Impedance Value: 220.723
Lead Channel Impedance Value: 220.723
Lead Channel Impedance Value: 242.157
Lead Channel Impedance Value: 242.157
Lead Channel Impedance Value: 342 Ohm
Lead Channel Impedance Value: 399 Ohm
Lead Channel Impedance Value: 475 Ohm
Lead Channel Impedance Value: 475 Ohm
Lead Channel Impedance Value: 494 Ohm
Lead Channel Impedance Value: 494 Ohm
Lead Channel Impedance Value: 494 Ohm
Lead Channel Impedance Value: 513 Ohm
Lead Channel Impedance Value: 760 Ohm
Lead Channel Impedance Value: 760 Ohm
Lead Channel Impedance Value: 760 Ohm
Lead Channel Impedance Value: 836 Ohm
Lead Channel Impedance Value: 836 Ohm
Lead Channel Pacing Threshold Amplitude: 0.5 V
Lead Channel Pacing Threshold Amplitude: 0.75 V
Lead Channel Pacing Threshold Amplitude: 1.75 V
Lead Channel Pacing Threshold Pulse Width: 0.4 ms
Lead Channel Pacing Threshold Pulse Width: 0.4 ms
Lead Channel Pacing Threshold Pulse Width: 0.8 ms
Lead Channel Sensing Intrinsic Amplitude: 0.875 mV
Lead Channel Sensing Intrinsic Amplitude: 1 mV
Lead Channel Sensing Intrinsic Amplitude: 15 mV
Lead Channel Sensing Intrinsic Amplitude: 16.375 mV
Lead Channel Setting Pacing Amplitude: 1.5 V
Lead Channel Setting Pacing Amplitude: 2.5 V
Lead Channel Setting Pacing Amplitude: 3 V
Lead Channel Setting Pacing Pulse Width: 0.4 ms
Lead Channel Setting Pacing Pulse Width: 0.8 ms
Lead Channel Setting Sensing Sensitivity: 0.3 mV

## 2021-07-18 NOTE — Patient Instructions (Signed)
[name], it was a pleasure seeing you today! ? ?Today we discussed: ?Low thyroid function- ?Today we are checking your TSH to see if the dosage of thyroid medication is at a good level. I will call you back with those results in a couple days. If the level is at a good level then you can f/u in 6 months. If remains elevated we will adjust dose and have you return in 6-8 weeks. ? ?I have ordered the following labs today: ? ?Lab Orders  ?No laboratory test(s) ordered today  ?  ? ?I will call if any are abnormal. All of your labs can be accessed through "My Chart" ?  ?My Chart Access: ?[email or web address] ? ?Tests ordered today: ? ?TSH ? ?Referrals ordered today:  ? ?Referral Orders  ?No referral(s) requested today  ?  ? ?I have ordered the following medication/changed the following medications:  ? ?Stop the following medications: ?There are no discontinued medications.  ? ?Start the following medications: ?No orders of the defined types were placed in this encounter. ?  ? ?Follow-up: 2 months  ? ?Please make sure to arrive 15 minutes prior to your next appointment. If you arrive late, you may be asked to reschedule.  ? ?We look forward to seeing you next time. Please call our clinic at [phone] if you have any questions or concerns. The best time to call is Monday-Friday from 9am-4pm, but there is someone available 24/7. If after hours or the weekend, call the main hospital number and ask for the Internal Medicine Resident On-Call. If you need medication refills, please notify your pharmacy one week in advance and they will send [place] a request. ? ?Thank you for letting us take part in your care. Wishing you the best! ? ?Thank you, ?Dr. Sloan Leiter ?[facility]  ?

## 2021-07-18 NOTE — Telephone Encounter (Signed)
Case moved from cath lab to [facility] on July 24, 2021. ? ?Pt notified. ?

## 2021-07-19 ENCOUNTER — Telehealth: Payer: Self-pay | Admitting: Internal Medicine

## 2021-07-19 LAB — TSH: TSH: 9.82 u[IU]/mL — ABNORMAL HIGH (ref 0.450–4.500)

## 2021-07-19 NOTE — Assessment & Plan Note (Signed)
Patient presents for repeat TSH. Patient presented to ED 11/22 due to lower extremities weakness and TSH at 30 at that time, amiodarone discontinued with plans to recheck TSH in a few months. TSH remained elevated at 33 05/29/21. Patient started on levothyroxine 50 mg. ?P: ?TSH at 9.8, will continue on Levothyroxine 50 mg ?Called and discussed with patient to follow-up in one year or sooner if he develops increasing fatigue or weakness. ?-follow-up in one year ?

## 2021-07-20 NOTE — Telephone Encounter (Signed)
Reviewed TSH results with patient. Asked him to continue current dose of levothyroxine and to follow-up in one year unless he develops symptoms. ?

## 2021-07-23 ENCOUNTER — Other Ambulatory Visit: Payer: Self-pay

## 2021-07-23 ENCOUNTER — Encounter (HOSPITAL_COMMUNITY): Payer: Self-pay | Admitting: Internal Medicine

## 2021-07-23 NOTE — Progress Notes (Addendum)
DUE TO COVID-19 ONLY ONE VISITOR IS ALLOWED TO COME WITH YOU AND STAY IN THE WAITING ROOM ONLY DURING PRE OP AND PROCEDURE DAY OF SURGERY.  ? ?Two VISITORS MAY VISIT WITH YOU AFTER SURGERY IN YOUR PRIVATE ROOM DURING VISITING HOURS ONLY! ? ?Internal Med - [name], DO ?Cardiologist - Tommye Standard, PA ?Electrophysiologist - Dr [name] ? ?Chest x-ray - 06/27/21 (1V) ?EKG - 06/27/21 ?Stress Test - n/a ?ECHO - 12/02/20 ?Cardiac Cath - n/a ? ?Medtronic ICD Pacemaker - ICD generator change, lead extraction and lead replacement. MD's office to coordinate with Med[employer] Rep.  Last remote check was on 07/10/21.  Perioperative Device Programming Orders initiated. ? ?Sleep Study -  n/a  ?CPAP - none ? ?Blood Thinner Instructions:  Per MD's instructions, stop Coumadin 2 days prior to procedure.  Last dose was on Saturday, 07/21/21. ? ?Anesthesia review: Yes ? ?STOP now taking any Aspirin (unless otherwise instructed by your surgeon), Aleve, Naproxen, Ibuprofen, Motrin, Advil, Goody's, BC's, all herbal medications, fish oil, and all vitamins.  ? ?Coronavirus Screening ?Covid test is scheduled on DOS ?Do you have any of the following symptoms:  ?Cough yes/no: No ?Fever (>100.41F)  yes/no: No ?Runny nose yes/no: No ?Sore throat yes/no: No ?Difficulty breathing/shortness of breath  yes/no: No ? ?Have you traveled in the last 14 days and where? yes/no: No ? ?Patient verbalized understanding of instructions that were given via phone. ?

## 2021-07-23 NOTE — Progress Notes (Signed)
Internal Medicine Clinic Attending  Case discussed with Dr. Masters  At the time of the visit.  We reviewed the resident's history and exam and pertinent patient test results.  I agree with the assessment, diagnosis, and plan of care documented in the resident's note.  

## 2021-07-24 ENCOUNTER — Inpatient Hospital Stay (HOSPITAL_COMMUNITY)
Admission: RE | Admit: 2021-07-24 | Discharge: 2021-07-25 | DRG: 227 | Disposition: A | Discharge status: Home / Self Care | Payer: Medicare Other | Attending: Internal Medicine | Admitting: Internal Medicine

## 2021-07-24 ENCOUNTER — Inpatient Hospital Stay (HOSPITAL_COMMUNITY): Payer: Medicare Other

## 2021-07-24 ENCOUNTER — Other Ambulatory Visit: Payer: Self-pay

## 2021-07-24 ENCOUNTER — Ambulatory Visit (HOSPITAL_COMMUNITY): Admission: RE | Admit: 2021-07-24 | Payer: Medicare Other | Source: Home / Self Care | Admitting: Internal Medicine

## 2021-07-24 ENCOUNTER — Encounter (HOSPITAL_COMMUNITY): Payer: Self-pay

## 2021-07-24 ENCOUNTER — Inpatient Hospital Stay (HOSPITAL_COMMUNITY): Admit: 2021-07-24 | Payer: Medicare Other | Admitting: Internal Medicine

## 2021-07-24 ENCOUNTER — Inpatient Hospital Stay (HOSPITAL_COMMUNITY): Payer: Medicare Other | Admitting: Physician Assistant

## 2021-07-24 ENCOUNTER — Encounter (HOSPITAL_COMMUNITY): Admission: RE | Payer: Self-pay | Source: Home / Self Care

## 2021-07-24 ENCOUNTER — Encounter (HOSPITAL_COMMUNITY): Payer: Self-pay | Admitting: Internal Medicine

## 2021-07-24 ENCOUNTER — Encounter (HOSPITAL_COMMUNITY)
Admission: RE | Disposition: A | Discharge status: Home / Self Care | Payer: Medicare Other | Source: Home / Self Care | Attending: Internal Medicine

## 2021-07-24 DIAGNOSIS — I5042 Chronic combined systolic (congestive) and diastolic (congestive) heart failure: Secondary | ICD-10-CM | POA: Diagnosis present

## 2021-07-24 DIAGNOSIS — E785 Hyperlipidemia, unspecified: Secondary | ICD-10-CM | POA: Diagnosis present

## 2021-07-24 DIAGNOSIS — I482 Chronic atrial fibrillation, unspecified: Secondary | ICD-10-CM | POA: Diagnosis present

## 2021-07-24 DIAGNOSIS — E039 Hypothyroidism, unspecified: Secondary | ICD-10-CM | POA: Diagnosis not present

## 2021-07-24 DIAGNOSIS — Z79899 Other long term (current) drug therapy: Secondary | ICD-10-CM | POA: Diagnosis not present

## 2021-07-24 DIAGNOSIS — I472 Ventricular tachycardia, unspecified: Secondary | ICD-10-CM | POA: Diagnosis present

## 2021-07-24 DIAGNOSIS — Z7901 Long term (current) use of anticoagulants: Secondary | ICD-10-CM | POA: Diagnosis not present

## 2021-07-24 DIAGNOSIS — Z8349 Family history of other endocrine, nutritional and metabolic diseases: Secondary | ICD-10-CM

## 2021-07-24 DIAGNOSIS — I2510 Atherosclerotic heart disease of native coronary artery without angina pectoris: Secondary | ICD-10-CM

## 2021-07-24 DIAGNOSIS — Z4889 Encounter for other specified surgical aftercare: Secondary | ICD-10-CM

## 2021-07-24 DIAGNOSIS — Z79890 Hormone replacement therapy (postmenopausal): Secondary | ICD-10-CM

## 2021-07-24 DIAGNOSIS — I272 Pulmonary hypertension, unspecified: Secondary | ICD-10-CM | POA: Diagnosis present

## 2021-07-24 DIAGNOSIS — Y712 Prosthetic and other implants, materials and accessory cardiovascular devices associated with adverse incidents: Secondary | ICD-10-CM | POA: Diagnosis present

## 2021-07-24 DIAGNOSIS — T82190A Other mechanical complication of cardiac electrode, initial encounter: Principal | ICD-10-CM | POA: Diagnosis present

## 2021-07-24 DIAGNOSIS — F17210 Nicotine dependence, cigarettes, uncomplicated: Secondary | ICD-10-CM | POA: Diagnosis present

## 2021-07-24 DIAGNOSIS — Z951 Presence of aortocoronary bypass graft: Secondary | ICD-10-CM | POA: Diagnosis not present

## 2021-07-24 DIAGNOSIS — Z20822 Contact with and (suspected) exposure to covid-19: Secondary | ICD-10-CM | POA: Diagnosis present

## 2021-07-24 DIAGNOSIS — I509 Heart failure, unspecified: Secondary | ICD-10-CM

## 2021-07-24 DIAGNOSIS — T82110A Breakdown (mechanical) of cardiac electrode, initial encounter: Secondary | ICD-10-CM | POA: Diagnosis not present

## 2021-07-24 DIAGNOSIS — Z833 Family history of diabetes mellitus: Secondary | ICD-10-CM

## 2021-07-24 DIAGNOSIS — I255 Ischemic cardiomyopathy: Secondary | ICD-10-CM | POA: Diagnosis present

## 2021-07-24 HISTORY — PX: ICD LEAD REMOVAL: SHX5855

## 2021-07-24 HISTORY — DX: Hypothyroidism, unspecified: E03.9

## 2021-07-24 HISTORY — PX: TEE WITHOUT CARDIOVERSION: SHX5443

## 2021-07-24 HISTORY — PX: IMPLANTABLE CARDIOVERTER DEFIBRILLATOR (ICD) GENERATOR CHANGE: SHX5469

## 2021-07-24 LAB — ECHO INTRAOPERATIVE TEE
Height: 70 in
Weight: 3424 oz

## 2021-07-24 LAB — SARS CORONAVIRUS 2 BY RT PCR (HOSPITAL ORDER, PERFORMED IN ~~LOC~~ HOSPITAL LAB): SARS Coronavirus 2: NEGATIVE

## 2021-07-24 LAB — PREPARE RBC (CROSSMATCH)

## 2021-07-24 LAB — SURGICAL PCR SCREEN
MRSA, PCR: NEGATIVE
Staphylococcus aureus: NEGATIVE

## 2021-07-24 LAB — PROTIME-INR
INR: 1.1 (ref 0.8–1.2)
Prothrombin Time: 14.2 seconds (ref 11.4–15.2)

## 2021-07-24 SURGERY — LEAD REVISION/REPAIR
Anesthesia: LOCAL

## 2021-07-24 SURGERY — ICD GENERATOR CHANGE
Anesthesia: General | Site: Esophagus

## 2021-07-24 SURGERY — ICD GENERATOR CHANGE
Anesthesia: General | Site: Chest

## 2021-07-24 MED ORDER — POVIDONE-IODINE 10 % EX SWAB
2.0000 "application " | Freq: Once | CUTANEOUS | Status: AC
  Administered 2021-07-24: 2 via TOPICAL

## 2021-07-24 MED ORDER — ACETAMINOPHEN 325 MG PO TABS: 325.0000 mg | ORAL_TABLET | ORAL | Status: DC | PRN

## 2021-07-24 MED ORDER — FENTANYL CITRATE (PF) 100 MCG/2ML IJ SOLN: 25.0000 ug | INTRAMUSCULAR | Status: DC | PRN

## 2021-07-24 MED ORDER — PHENTOLAMINE MESYLATE 5 MG IJ SOLR
5.0000 mg | Freq: Once | INTRAMUSCULAR | Status: DC
  Filled 2021-07-24: qty 5

## 2021-07-24 MED ORDER — PHENYLEPHRINE 40 MCG/ML (10ML) SYRINGE FOR IV PUSH (FOR BLOOD PRESSURE SUPPORT)
PREFILLED_SYRINGE | INTRAVENOUS | Status: AC
  Filled 2021-07-24: qty 10

## 2021-07-24 MED ORDER — STERILE WATER FOR INJECTION IJ SOLN
INTRAMUSCULAR | Status: AC
Start: 2021-07-24 — End: 2021-07-25
  Filled 2021-07-24: qty 10

## 2021-07-24 MED ORDER — CHLORHEXIDINE GLUCONATE 0.12 % MT SOLN: 15.0000 mL | Freq: Once | OROMUCOSAL | Status: AC

## 2021-07-24 MED ORDER — SODIUM CHLORIDE 0.9 % IV SOLN
INTRAVENOUS | Status: DC | PRN
  Administered 2021-07-24 (×2): 80 mg

## 2021-07-24 MED ORDER — CARVEDILOL 25 MG PO TABS
25.0000 mg | ORAL_TABLET | Freq: Two times a day (BID) | ORAL | Status: DC
  Administered 2021-07-25: 25 mg via ORAL
  Filled 2021-07-24: qty 1

## 2021-07-24 MED ORDER — FUROSEMIDE 40 MG PO TABS
40.0000 mg | ORAL_TABLET | Freq: Every day | ORAL | Status: DC
  Administered 2021-07-24 – 2021-07-25 (×2): 40 mg via ORAL
  Filled 2021-07-24 (×2): qty 1

## 2021-07-24 MED ORDER — LIDOCAINE HCL (CARDIAC) PF 100 MG/5ML IV SOSY
PREFILLED_SYRINGE | INTRAVENOUS | Status: DC | PRN
  Administered 2021-07-24: 60 mg via INTRAVENOUS

## 2021-07-24 MED ORDER — CHLORHEXIDINE GLUCONATE 4 % EX LIQD: 4.0000 "application " | Freq: Once | CUTANEOUS | Status: DC

## 2021-07-24 MED ORDER — CHLORHEXIDINE GLUCONATE 0.12 % MT SOLN
OROMUCOSAL | Status: AC
  Administered 2021-07-24: 15 mL via OROMUCOSAL
  Filled 2021-07-24: qty 15

## 2021-07-24 MED ORDER — LIDOCAINE 2% (20 MG/ML) 5 ML SYRINGE
INTRAMUSCULAR | Status: AC
  Filled 2021-07-24: qty 15

## 2021-07-24 MED ORDER — CEFAZOLIN SODIUM-DEXTROSE 1-4 GM/50ML-% IV SOLN
1.0000 g | Freq: Four times a day (QID) | INTRAVENOUS | Status: AC
  Administered 2021-07-24 – 2021-07-25 (×3): 1 g via INTRAVENOUS
  Filled 2021-07-24 (×5): qty 50

## 2021-07-24 MED ORDER — NOREPINEPHRINE 4 MG/250ML-% IV SOLN
INTRAVENOUS | Status: DC | PRN
  Administered 2021-07-24: 2 ug/min via INTRAVENOUS

## 2021-07-24 MED ORDER — PHENTOLAMINE MESYLATE 5 MG IJ SOLR
5.0000 mg | Freq: Once | INTRAMUSCULAR | Status: AC
  Administered 2021-07-24: 5 mg via SUBCUTANEOUS
  Filled 2021-07-24: qty 5

## 2021-07-24 MED ORDER — PHENYLEPHRINE HCL-NACL 20-0.9 MG/250ML-% IV SOLN
INTRAVENOUS | Status: DC | PRN
  Administered 2021-07-24: 20 ug/min via INTRAVENOUS

## 2021-07-24 MED ORDER — SODIUM CHLORIDE 0.9 % IV SOLN
INTRAVENOUS | Status: AC
  Filled 2021-07-24 (×2): qty 2

## 2021-07-24 MED ORDER — FENTANYL CITRATE (PF) 250 MCG/5ML IJ SOLN
INTRAMUSCULAR | Status: AC
  Filled 2021-07-24: qty 5

## 2021-07-24 MED ORDER — DEXAMETHASONE SODIUM PHOSPHATE 4 MG/ML IJ SOLN
INTRAMUSCULAR | Status: DC | PRN
  Administered 2021-07-24: 4 mg via INTRAVENOUS

## 2021-07-24 MED ORDER — CEFAZOLIN SODIUM-DEXTROSE 2-4 GM/100ML-% IV SOLN
2.0000 g | INTRAVENOUS | Status: AC
  Administered 2021-07-24: 2 g via INTRAVENOUS
  Filled 2021-07-24: qty 100

## 2021-07-24 MED ORDER — SUGAMMADEX SODIUM 200 MG/2ML IV SOLN
INTRAVENOUS | Status: DC | PRN
  Administered 2021-07-24: 200 mg via INTRAVENOUS

## 2021-07-24 MED ORDER — SODIUM CHLORIDE 0.9 % IV SOLN: INTRAVENOUS | Status: DC

## 2021-07-24 MED ORDER — LOSARTAN POTASSIUM 25 MG PO TABS
12.5000 mg | ORAL_TABLET | Freq: Every day | ORAL | Status: DC
  Administered 2021-07-24 – 2021-07-25 (×2): 12.5 mg via ORAL
  Filled 2021-07-24 (×2): qty 1

## 2021-07-24 MED ORDER — EPHEDRINE SULFATE (PRESSORS) 50 MG/ML IJ SOLN
INTRAMUSCULAR | Status: DC | PRN
  Administered 2021-07-24: 10 mg via INTRAVENOUS
  Administered 2021-07-24: 15 mg via INTRAVENOUS

## 2021-07-24 MED ORDER — ETOMIDATE 2 MG/ML IV SOLN
INTRAVENOUS | Status: DC | PRN
  Administered 2021-07-24: 20 mg via INTRAVENOUS

## 2021-07-24 MED ORDER — ROCURONIUM BROMIDE 10 MG/ML (PF) SYRINGE
PREFILLED_SYRINGE | INTRAVENOUS | Status: AC
  Filled 2021-07-24: qty 10

## 2021-07-24 MED ORDER — EPHEDRINE 5 MG/ML INJ
INTRAVENOUS | Status: AC
  Filled 2021-07-24: qty 5

## 2021-07-24 MED ORDER — PHENTOLAMINE MESYLATE 5 MG IJ SOLR: 5.0000 mg | Freq: Once | INTRAMUSCULAR | Status: DC

## 2021-07-24 MED ORDER — SODIUM CHLORIDE 0.9 % IV SOLN: 80.0000 mg | INTRAVENOUS | Status: DC

## 2021-07-24 MED ORDER — LACTATED RINGERS IV SOLN: INTRAVENOUS | Status: DC | PRN

## 2021-07-24 MED ORDER — LACTATED RINGERS IV SOLN: INTRAVENOUS | Status: DC

## 2021-07-24 MED ORDER — OXYCODONE HCL 5 MG/5ML PO SOLN: 5.0000 mg | Freq: Once | ORAL | Status: DC | PRN

## 2021-07-24 MED ORDER — SPIRONOLACTONE 25 MG PO TABS
25.0000 mg | ORAL_TABLET | Freq: Every day | ORAL | Status: DC
  Administered 2021-07-24 – 2021-07-25 (×2): 25 mg via ORAL
  Filled 2021-07-24 (×2): qty 1

## 2021-07-24 MED ORDER — ONDANSETRON HCL 4 MG/2ML IJ SOLN: 4.0000 mg | Freq: Four times a day (QID) | INTRAMUSCULAR | Status: DC | PRN

## 2021-07-24 MED ORDER — OXYCODONE HCL 5 MG PO TABS: 5.0000 mg | ORAL_TABLET | Freq: Once | ORAL | Status: DC | PRN

## 2021-07-24 MED ORDER — HEPARIN 6000 UNIT IRRIGATION SOLUTION
Status: DC | PRN
  Administered 2021-07-24: 1

## 2021-07-24 MED ORDER — SODIUM CHLORIDE 0.9% IV SOLUTION: Freq: Once | INTRAVENOUS | Status: AC

## 2021-07-24 MED ORDER — ORAL CARE MOUTH RINSE: 15.0000 mL | Freq: Once | OROMUCOSAL | Status: AC

## 2021-07-24 MED ORDER — ONDANSETRON HCL 4 MG/2ML IJ SOLN
INTRAMUSCULAR | Status: DC | PRN
  Administered 2021-07-24: 4 mg via INTRAVENOUS

## 2021-07-24 MED ORDER — MIDAZOLAM HCL 2 MG/2ML IJ SOLN
INTRAMUSCULAR | Status: AC
  Filled 2021-07-24: qty 2

## 2021-07-24 MED ORDER — MIDAZOLAM HCL 5 MG/5ML IJ SOLN
INTRAMUSCULAR | Status: DC | PRN
Start: 2021-07-24 — End: 2021-07-24
  Administered 2021-07-24: 2 mg via INTRAVENOUS

## 2021-07-24 MED ORDER — ROCURONIUM BROMIDE 100 MG/10ML IV SOLN
INTRAVENOUS | Status: DC | PRN
Start: 2021-07-24 — End: 2021-07-24
  Administered 2021-07-24: 60 mg via INTRAVENOUS

## 2021-07-24 MED ORDER — NITROGLYCERIN 2 % TD OINT
1.0000 [in_us] | TOPICAL_OINTMENT | Freq: Three times a day (TID) | TRANSDERMAL | Status: DC
  Administered 2021-07-24 – 2021-07-25 (×3): 1 [in_us] via TOPICAL
  Filled 2021-07-24: qty 30

## 2021-07-24 MED ORDER — LEVOTHYROXINE SODIUM 50 MCG PO TABS
50.0000 ug | ORAL_TABLET | Freq: Every day | ORAL | Status: DC
  Administered 2021-07-25: 50 ug via ORAL
  Filled 2021-07-24: qty 1

## 2021-07-24 MED ORDER — FENTANYL CITRATE (PF) 100 MCG/2ML IJ SOLN
INTRAMUSCULAR | Status: DC | PRN
  Administered 2021-07-24: 100 ug via INTRAVENOUS

## 2021-07-24 SURGICAL SUPPLY — 57 items
BAG BANDED W/RUBBER/TAPE 36X54 (MISCELLANEOUS) IMPLANT
BAG COUNTER SPONGE SURGICOUNT (BAG) ×9 IMPLANT
BAG DECANTER FOR FLEXI CONT (MISCELLANEOUS) IMPLANT
BENZOIN TINCTURE PRP APPL 2/3 (GAUZE/BANDAGES/DRESSINGS) ×1 IMPLANT
BLADE CLIPPER SURG (BLADE) IMPLANT
BLADE OSCILLATING /SAGITTAL (BLADE) IMPLANT
BLADE STERNUM SYSTEM 6 (BLADE) IMPLANT
BNDG COHESIVE 4X5 WHT NS (GAUZE/BANDAGES/DRESSINGS) ×3 IMPLANT
CANISTER SUCT 3000ML PPV (MISCELLANEOUS) ×3 IMPLANT
CATH FOLEY 16FR TEMP PROBE (CATHETERS) ×3 IMPLANT
CLSR STERI-STRIP ANTIMIC 1/2X4 (GAUZE/BANDAGES/DRESSINGS) ×1 IMPLANT
COIL ONE TIE COMPRESSION (VASCULAR PRODUCTS) ×1 IMPLANT
COVER BACK TABLE 60X90IN (DRAPES) ×3 IMPLANT
DRAPE C-ARM 42X72 X-RAY (DRAPES) IMPLANT
DRAPE CARDIOVASCULAR INCISE (DRAPES) ×1
DRAPE HALF SHEET 40X57 (DRAPES) ×6 IMPLANT
DRAPE INCISE IOBAN 66X45 STRL (DRAPES) IMPLANT
DRAPE SRG 135X102X78XABS (DRAPES) ×2 IMPLANT
DRSG TEGADERM 4X4.75 (GAUZE/BANDAGES/DRESSINGS) ×1 IMPLANT
ELECT REM PT RETURN 9FT ADLT (ELECTROSURGICAL) ×6
ELECTRODE REM PT RTRN 9FT ADLT (ELECTROSURGICAL) ×4 IMPLANT
FELT TEFLON 1X6 (MISCELLANEOUS) IMPLANT
GAUZE 4X4 16PLY ~~LOC~~+RFID DBL (SPONGE) ×2 IMPLANT
GAUZE SPONGE 4X4 12PLY STRL (GAUZE/BANDAGES/DRESSINGS) ×3 IMPLANT
GAUZE SPONGE 4X4 12PLY STRL LF (GAUZE/BANDAGES/DRESSINGS) ×1 IMPLANT
GLOVE SURG LTX SZ8 (GLOVE) ×3 IMPLANT
GLOVE SURG UNDER POLY LF SZ7.5 (GLOVE) ×3 IMPLANT
GOWN STRL REUS W/ TWL LRG LVL3 (GOWN DISPOSABLE) ×2 IMPLANT
GOWN STRL REUS W/ TWL XL LVL3 (GOWN DISPOSABLE) ×2 IMPLANT
GOWN STRL REUS W/TWL LRG LVL3 (GOWN DISPOSABLE) ×1
GOWN STRL REUS W/TWL XL LVL3 (GOWN DISPOSABLE) ×1
ICD CLARIA MRI DTMA1Q1 (ICD Generator) ×1 IMPLANT
KIT LEAD ACCESSORY 6056M PINCH (KITS) ×1 IMPLANT
KIT TURNOVER KIT B (KITS) ×3 IMPLANT
KIT WRENCH (KITS) ×1 IMPLANT
LEAD RELIANCE 0138-64 (Lead) ×1 IMPLANT
NS IRRIG 1000ML POUR BTL (IV SOLUTION) IMPLANT
PACEMAKER STYLET 65CML PURPLE (MISCELLANEOUS) ×1 IMPLANT
PAD ARMBOARD 7.5X6 YLW CONV (MISCELLANEOUS) ×6 IMPLANT
PAD ELECT DEFIB RADIOL ZOLL (MISCELLANEOUS) ×3 IMPLANT
POUCH AIGIS-R ANTIBACT ICD (Mesh General) ×3 IMPLANT
POUCH AIGIS-R ANTIBACT ICD LRG (Mesh General) IMPLANT
SHEATH 9.5FR PRELUDE SNAP 13 (SHEATH) ×1 IMPLANT
SHEATH EVOLUTION RL 11F (SHEATH) ×1 IMPLANT
SHEATH EVOLUTION SHORTE RL 11F (SHEATH) ×1 IMPLANT
SLING ARM FOAM STRAP LRG (SOFTGOODS) ×1 IMPLANT
STYLET LIBERATOR LOCKING (MISCELLANEOUS) ×1 IMPLANT
SUT PROLENE 2 0 CT2 30 (SUTURE) IMPLANT
SUT PROLENE 2 0 SH DA (SUTURE) IMPLANT
SUT VIC AB 2-0 CT2 18 VCP726D (SUTURE) IMPLANT
SUT VIC AB 3-0 X1 27 (SUTURE) IMPLANT
TAPE CLOTH SURG 4X10 WHT LF (GAUZE/BANDAGES/DRESSINGS) ×1 IMPLANT
TOWEL GREEN STERILE (TOWEL DISPOSABLE) ×6 IMPLANT
TOWEL GREEN STERILE FF (TOWEL DISPOSABLE) ×6 IMPLANT
TRAY FOLEY MTR SLVR 16FR STAT (SET/KITS/TRAYS/PACK) ×3 IMPLANT
TUBE CONNECTING 12X1/4 (SUCTIONS) IMPLANT
YANKAUER SUCT BULB TIP NO VENT (SUCTIONS) IMPLANT

## 2021-07-24 NOTE — Anesthesia Procedure Notes (Signed)
Procedure Name: Intubation ?Date/Time: 07/24/2021 3:38 PM ?Performed by: Oletta Lamas, CRNA ?Pre-anesthesia Checklist: Patient identified, Emergency Drugs available, Suction available and Patient being monitored ?Patient Re-evaluated:Patient Re-evaluated prior to induction ?Oxygen Delivery Method: Circle System Utilized ?Preoxygenation: Pre-oxygenation with 100% oxygen ?Induction Type: IV induction ?Ventilation: Mask ventilation without difficulty ?Laryngoscope Size: Mac and 4 ?Grade View: Grade I ?Tube type: Oral ?Number of attempts: 1 ?Airway Equipment and Method: Stylet and Oral airway ?Placement Confirmation: ETT inserted through vocal cords under direct vision, positive ETCO2 and breath sounds checked- equal and bilateral ?Secured at: 23 cm ?Tube secured with: Tape ?Dental Injury: Teeth and Oropharynx as per pre-operative assessment  ? ? ? ? ?

## 2021-07-24 NOTE — Transfer of Care (Signed)
Immediate Anesthesia Transfer of Care Note ? ?Patient: Joseph Carson ? ?Procedure(s) Performed: ICD GENERATOR CHANGE (Chest) ?ICD LEAD EXTRACTION AND REPLACEMENT (Chest) ?TRANSESOPHAGEAL ECHOCARDIOGRAM (TEE) (Esophagus) ? ?Patient Location: PACU ? ?Anesthesia Type:General ? ?Level of Consciousness: awake, alert , oriented and patient cooperative ? ?Airway & Oxygen Therapy: Patient Spontanous Breathing and Patient connected to face mask oxygen ? ?Post-op Assessment: Report given to RN and Post -op Vital signs reviewed and stable ? ?Post vital signs: Reviewed and stable ? ?Last Vitals:  ?Vitals Value Taken Time  ?BP    ?Temp    ?Pulse 60 07/24/21 1744  ?Resp 18 07/24/21 1744  ?SpO2 95 % 07/24/21 1744  ?Vitals shown include unvalidated device data. ? ?Last Pain:  ?Vitals:  ? 07/24/21 1305  ?TempSrc:   ?PainSc: 0-No pain  ?   ? ?  ? ?Complications: No notable events documented.  ? ?IV infiltration to right antecubital reviewed with nurse. Approximate size 5cmx5cm. Site remains blanched and warm to touch with <1 sec cap refills to right hand. Instructed to place cold compress per MDA Smith Robert).   ?

## 2021-07-24 NOTE — Anesthesia Preprocedure Evaluation (Signed)
Anesthesia Evaluation  ?Patient identified by MRN, date of birth, ID band ?Patient awake ? ? ? ?Reviewed: ?Allergy & Precautions, H&P , NPO status , Patient's Chart, lab work & pertinent test results ? ?Airway ?Mallampati: II ? ? ?Neck ROM: full ? ? ? Dental ?  ?Pulmonary ?Current Smoker,  ?  ?breath sounds clear to auscultation ? ? ? ? ? ? Cardiovascular ?+ CAD and +CHF  ?+ dysrhythmias + Cardiac Defibrillator ? ?Rhythm:regular Rate:Normal ? ?EF 15-20% ?  ?Neuro/Psych ?  ? GI/Hepatic ?  ?Endo/Other  ?Hypothyroidism  ? Renal/GU ?  ? ?  ?Musculoskeletal ? ? Abdominal ?  ?Peds ? Hematology ?  ?Anesthesia Other Findings ? ? Reproductive/Obstetrics ? ?  ? ? ? ? ? ? ? ? ? ? ? ? ? ?  ?  ? ? ? ? ? ? ? ? ?Anesthesia Physical ?Anesthesia Plan ? ?ASA: 4 ? ?Anesthesia Plan: General  ? ?Post-op Pain Management:   ? ?Induction: Intravenous ? ?PONV Risk Score and Plan: 1 and Ondansetron, Dexamethasone, Midazolam and Treatment may vary due to age or medical condition ? ?Airway Management Planned: Oral ETT ? ?Additional Equipment: Arterial line and TEE ? ?Intra-op Plan:  ? ?Post-operative Plan: Extubation in OR ? ?Informed Consent: I have reviewed the patients History and Physical, chart, labs and discussed the procedure including the risks, benefits and alternatives for the proposed anesthesia with the patient or authorized representative who has indicated his/her understanding and acceptance.  ? ? ? ?Dental advisory given ? ?Plan Discussed with: Anesthesiologist, CRNA and Surgeon ? ?Anesthesia Plan Comments:   ? ? ? ? ? ? ?Anesthesia Quick Evaluation ? ?

## 2021-07-24 NOTE — CV Procedure (Signed)
EP Procedure Note ? ?Preoperative diagnosis: ICD lead failure ? ?Postoperative diagnosis: same as preoperative diagnosis ? ?Procedure Performed: ICD lead extraction, ICD pocket relocation to a subpectoral location, removal of a biv ICD and insertion of a new Biv ICD ? ?Description of the procedure: After informed consent was obtained, the patient was prepped and draped in a sterile fashion. The anesthesia service was utilized to provide general anesthesia, invasive arterial monitoring and intraoperative TEE. After the appropriate time out, the right femoral vein was punctured and a 6 Fr sheath inserted for central venous access.  ? ?Attention was then turned to the ICD pocket. A 6 cm ellipse incision was made and electrocautery used to dissect down to the generator. The leads were free up from there dense scar tissue with electrocautery. The sewing sleeve was removed. The silk suture was removed. A stylet was inserted into the body of the fractured ICD lead and the helix was retracted. Traction was placed on the lead but it was fixed in the heart. The stylet was removed and a Cook liberator locking stylet was inserted and locked in place. The [address] one tie was used to secure the proximal portion of the lead to the locking stylet. The Cook 11 Fr short RL was then advanced over the ICD lead down to the junction of the inniminate vein. The short RL was removed and the [facility] 11 Fr long RL sheath was advanced over the lead. A combination of traction, counter traction, pressure and counter pressue was applied to the lead with the extraction sheath and the lead was removed in total. There was no hemodynamic sequelae. Access was maintained. The glide wire was inserted into the RA and a 9 Fr long peel away sheeth was advanced over the guide wire. The [facility] active fixation lead, # [id number] was acvanced into the RV. The R waves were small throughout. At the final site the lead was actively fixed and the R waves measured 5.5  mV, the impedence was 380 and the threshold was 1.25 at 0.6 V. The lead was secured to the fascia with silk suture and the sewing sleeve secured with silk suture. The pocket was irrigated.  ? ?At this point attention was turned to a sub pectoral pocket. A combination of electrocautery and blunt dissection was used to create a subpectoral pocket. A Medtronic anti-biotic pouch was used to envelope the leads and can and placed back in the pocket which was then irrigated with anti-biotic irrigation. The pectoralis major was reapproximated with 2-0 vicryl suture and the deep tissue also closed with 2-0 vicryl and the skin closed with 3-0 vicryl . The patient's femoral venous sheath was removed and hemostasis assured. Benzoin and steri strips were painted on the skin and the patient returned to his room in stable condition.  ? ?Complications: none immediately. ? ?Conclusion: successful ICD lead extraction, removal of a Biv ICD which had less than 2 years of battery longevity and insertion of  anew BIv ICD generator with relocation of the ICD generator to a subpectoral location.  ? ?Joseph Overlie Tabytha Gradillas,MD ?

## 2021-07-24 NOTE — Plan of Care (Signed)

## 2021-07-24 NOTE — Progress Notes (Signed)
?  Echocardiogram ?2D Echocardiogram has been performed. ? ?Joseph Carson ?07/24/2021, 3:39 PM ?

## 2021-07-24 NOTE — Interval H&P Note (Signed)
History and Physical Interval Note: ? ?07/24/2021 ?12:45 PM ? ?Joseph Carson  has presented today for surgery, with the diagnosis of LEAD FAILURE.  The various methods of treatment have been discussed with the patient and family. After consideration of risks, benefits and other options for treatment, the patient has consented to  Procedure(s): ?ICD GENERATOR CHANGE (N/A) ?ICD LEAD EXTRACTION AND REPLACEMENT (N/A) as a surgical intervention.  The patient's history has been reviewed, patient examined, no change in status, stable for surgery.  I have reviewed the patient's chart and labs.  Questions were answered to the patient's satisfaction.   ? ? ?[name] ? ? ?

## 2021-07-25 MED ORDER — VASOPRESSIN 20 UNIT/ML IV SOLN
INTRAVENOUS | Status: AC
  Filled 2021-07-25: qty 1

## 2021-07-25 NOTE — Progress Notes (Signed)
New IV started. Last dose of antibiotic given. ?

## 2021-07-25 NOTE — Progress Notes (Signed)
IV Team consulted for PIV placement due to difficult stick.  Upon arrival, IV was already placed; confirmed with primary RN. ?

## 2021-07-25 NOTE — Progress Notes (Signed)
? ?  Paged to be informed pt last dose of ABX not given and IV had already been discontinued.   ? ?Recommend he complete the ordered course as he is s/p extraction and revision and at a higher than normal risk of infection.  ? ?Kyzer Blowe, PA-C  ?07/25/2021 10:57 AM  ?

## 2021-07-25 NOTE — Progress Notes (Signed)
Pt has orders to be discharged. Discharge instructions given and pt has no additional questions at this time. Medication regimen reviewed and pt educated. Pt verbalized understanding and has no additional questions. Telemetry box removed. IV removed and site in good condition. Pt stable and waiting for transportation. 

## 2021-07-25 NOTE — TOC Transition Note (Addendum)
Transition of Care (TOC) - CM/SW Discharge Note ? ? ?Patient Details  ?Name: Joseph Carson ?MRN: [id number] ?Date of Birth: Oct 03, 1955 ? ?Transition of Care (TOC) CM/SW Contact:  ?Leone Haven, RN ?Phone Number: ?07/25/2021, 9:54 AM ? ? ?Clinical Narrative:    ?Patient  is for dc today , has no needs. He states his daughter will transport him home. ? ? ?  ?  ? ? ?Patient Goals and CMS Choice ?  ?  ?  ? ?Discharge Placement ?  ?           ?  ?  ?  ?  ? ?Discharge Plan and Services ?  ?  ?           ?  ?  ?  ?  ?  ?  ?  ?  ?  ?  ? ?Social Determinants of Health (SDOH) Interventions ?  ? ? ?Readmission Risk Interventions ?No flowsheet data found. ? ? ? ? ?

## 2021-07-25 NOTE — Discharge Summary (Addendum)
? ? ? ?ELECTROPHYSIOLOGY PROCEDURE DISCHARGE SUMMARY  ? ? ?Patient ID: Joseph Carson,  ?MRN: [id number], DOB/AGE: 66-Apr-1957 66 y.o. ? ?Admit date: 07/24/2021 ?Discharge date: 07/25/2021 ? ?Primary Care Physician: [name], DO  ?Primary Cardiologist: None  ?Electrophysiologist: [name], MD  ? ?Primary Diagnosis:  ?ICD lead malfuction ? ?Secondary Diagnosis: ?Chronic systolic CHF ?VT ?PAF ? ?No Known Allergies ? ? ?Procedures This Admission:  ?1.  Extraction of a fractured MDT RV lead  ?2. ICD pocket relocation to a subpectoral location ?3. Implantation of a [employer] active fixation lead ?4. BiV generator change ?2.  CXR on 07/25/21 demonstrated no pneumothorax status post device implantation. ? ?Brief HPI: ?Joseph Carson is a 66 y.o. male was  followed by electrophysiology chronically.    Noted to have recurrent episodes of HV impedence elevations. With history of HV therapies, he was felt to warrant lead extraction and revision. Past medical history includes above.  Risks, benefits, and alternatives to extraction and revision were reviewed with the patient who wished to proceed.  ? ?Hospital Course:  ?The patient was admitted and underwent extraction of a fractured MDT lead with BSx lead implanted, gen change, and relocation of generator pocket to subpectoral location with details as outlined above. See CV procedure note for more details. They were monitored on telemetry overnight which demonstrated appropriate pacing.  Left chest was without hematoma or ecchymosis.  The device was interrogated and found to be functioning normally.  CXR was obtained and demonstrated no pneumothorax status post device implantation..  Wound care, arm mobility, and restrictions were reviewed with the patient.  The patient was examined and considered stable for discharge to home.  ? ?The patient's discharge medications include an ACE-I/ARB/ARNI (losartan) and beta blocker (coreg). Regarding blood thinner  therapy, they were instructed to resume their coumadin (medication name) on 07/25/2021 (date/time).  ? ?Physical Exam: ?Vitals:  ? 07/24/21 2351 07/24/21 2353 07/25/21 0500 07/25/21 0738  ?BP:  108/72 131/73 137/63  ?Pulse: 62  64 65  ?Resp: 19  16 15   ?Temp: 98.1 ?F (36.7 ?C)  98.5 ?F (36.9 ?C) 97.8 ?F (36.6 ?C)  ?TempSrc: Tympanic  Oral Oral  ?SpO2: 91%  97% 94%  ?Weight:   96.2 kg   ?Height:      ? ? ?GEN- The patient is well appearing, alert and oriented x 3 today.   ?HEENT: normocephalic, atraumatic; sclera clear, conjunctiva pink; hearing intact; oropharynx clear; neck supple, no JVP ?Lymph- no cervical lymphadenopathy ?Lungs- Clear to ausculation bilaterally, normal work of breathing.  No wheezes, rales, rhonchi ?Heart- Regular rate and rhythm, no murmurs, rubs or gallops, PMI not laterally displaced ?GI- soft, non-tender, non-distended, bowel sounds present, no hepatosplenomegaly ?Extremities- no clubbing, cyanosis, or edema; DP/PT/radial pulses 2+ bilaterally ?MS- no significant deformity or atrophy ?Skin- warm and dry, no rash or lesion. ICD site stable. ?Psych- euthymic mood, full affect ?Neuro- strength and sensation are intact ? ? ?Labs: ?  ?Lab Results  ?Component Value Date  ? WBC 10.5 06/28/2021  ? HGB 13.0 06/28/2021  ? HCT 39.5 06/28/2021  ? MCV 91.2 06/28/2021  ? PLT 189 06/28/2021  ? No results for input(s): NA, K, CL, CO2, BUN, CREATININE, CALCIUM, PROT, BILITOT, ALKPHOS, ALT, AST, GLUCOSE in the last 168 hours. ? ?Invalid input(s): LABALBU ? ?Discharge Medications:  ?Allergies as of 07/25/2021   ?No Known Allergies ?  ? ?  ?Medication List  ?  ? ?TAKE these medications   ? ?acetaminophen 325 MG  tablet ?Commonly known as: TYLENOL ?Take 1-2 tablets (325-650 mg total) by mouth every 4 (four) hours as needed for mild pain. ?  ?carvedilol 25 MG tablet ?Commonly known as: COREG ?Take 1 tablet (25 mg total) by mouth 2 (two) times daily with a meal. ?  ?furosemide 40 MG tablet ?Commonly known as:  LASIX ?TAKE 1 TABLET BY MOUTH DAILY ?  ?HAIR/SKIN/NAILS PO ?Take 1 tablet by mouth daily. ?  ?levothyroxine 50 MCG tablet ?Commonly known as: Synthroid ?Take 1 tablet (50 mcg total) by mouth daily before breakfast. ?  ?losartan 25 MG tablet ?Commonly known as: COZAAR ?Take 0.5 tablets (12.5 mg total) by mouth daily. ?  ?spironolactone 25 MG tablet ?Commonly known as: ALDACTONE ?TAKE 1 TABLET BY MOUTH DAILY. ?  ?warfarin 2.5 MG tablet ?Commonly known as: COUMADIN ?Take as directed. If you are unsure how to take this medication, talk to your nurse or doctor. ?Original instructions: Take 1.25-2.5 mg by mouth See admin instructions. 2.5 mg Sun, Mon, Wed and Fri, 1.25 mg all other days (Tues, Thurs and Sat) ?  ?warfarin 5 MG tablet ?Commonly known as: COUMADIN ?Take as directed. If you are unsure how to take this medication, talk to your nurse or doctor. ?Original instructions: TAKE 1/2 TO 1 TABLET BY MOUTH DAILY AS DIRECTED BY COUMADIN CLINIC ?  ? ?  ? ? ?Disposition:  ? ? Follow-up Information   ? ? Baldwin Jamaica, PA-C Follow up.   ?Specialty: Cardiology ?Why: on 3/21 at 245 for post extraction check ?Contact information: ?[address] ?[address] ?[place] [address] ?[phone] ? ? ?  ?  ? ?  ?  ? ?  ? ? ?Duration of Discharge Encounter: Greater than 30 minutes including physician time. ? ?Signed, ?Shirley Friar, PA-C  ?07/25/2021 ?9:27 AM ? ?EP attending ? ?Patient seen and examined.  Agree with the findings as noted above.  The patient is doing well after undergoing extraction of a broken ICD lead and insertion of a new ICD lead.  No obvious bleeding.  He will be discharged home with usual follow-up. ? ?[name], MD ? ? ? ?

## 2021-07-25 NOTE — Anesthesia Postprocedure Evaluation (Signed)
Anesthesia Post Note ? ?Patient: Joseph Carson ? ?Procedure(s) Performed: ICD GENERATOR CHANGE (Chest) ?ICD LEAD EXTRACTION AND REPLACEMENT (Chest) ?TRANSESOPHAGEAL ECHOCARDIOGRAM (TEE) (Esophagus) ? ?  ? ?Patient location during evaluation: PACU ?Anesthesia Type: General ?Level of consciousness: awake and alert ?Pain management: pain level controlled ?Vital Signs Assessment: post-procedure vital signs reviewed and stable ?Respiratory status: spontaneous breathing, nonlabored ventilation, respiratory function stable and patient connected to nasal cannula oxygen ?Cardiovascular status: blood pressure returned to baseline and stable ?Postop Assessment: no apparent nausea or vomiting ?Anesthetic complications: no ?Comments: IV infiltrated, continue to monitor site.  ? ? ?No notable events documented. ?  ?  ?  ?  ?  ? ?Effie Berkshire ? ? ? ? ?

## 2021-07-25 NOTE — Discharge Instructions (Signed)
After Your ICD ?(Implantable Cardiac Defibrillator) ? ? ?You have a Medtronic ICD ? ?ACTIVITY ?Do not lift your arm above shoulder height for 1 week after your procedure. After 7 days, you may progress as below.  ?You should remove your sling 24 hours after your procedure, unless otherwise instructed by your provider.  ? ? ? Wednesday August 01, 2021  Thursday August 02, 2021 Friday August 03, 2021 Saturday August 04, 2021  ? ?Do not lift, push, pull, or carry anything over 10 pounds with the affected arm until 6 weeks (Wednesday September 05, 2021 ) after your procedure.  ? ?You may drive AFTER your wound check, unless you have been told otherwise by your provider.  ? ?Ask your healthcare provider when you can go back to work ? ? ?INCISION/Dressing ?If you are on a blood thinner such as Coumadin, Xarelto, Eliquis, Plavix, or Pradaxa please confirm with your provider when this should be resumed.  ? ?If large square, outer bandage is left in place, this can be removed after 24 hours from your procedure. Do not remove steri-strips or glue as below.  ? ?Monitor your defibrillator site for redness, swelling, and drainage. Call the device clinic at [phone] if you experience these symptoms or fever/chills. ? ?If your incision is sealed with Steri-strips or staples, you may shower 10 days after your procedure or when told by your provider. Do not remove the steri-strips or let the shower hit directly on your site. You may wash around your site with soap and water.   ? ?If you were discharged in a sling, please do not wear this during the day more than 48 hours after your surgery unless otherwise instructed. This may increase the risk of stiffness and soreness in your shoulder.  ? ?Avoid lotions, ointments, or perfumes over your incision until it is well-healed. ? ?You may use a hot tub or a pool AFTER your wound check appointment if the incision is completely closed. ? ?Your ICD is designed to protect you from life  threatening heart rhythms. Because of this, you may receive a shock.  ? ?1 shock with no symptoms:  Call the office during business hours. ?1 shock with symptoms (chest pain, chest pressure, dizziness, lightheadedness, shortness of breath, overall feeling unwell):  Call 911. ?If you experience 2 or more shocks in 24 hours:  Call 911. ?If you receive a shock, you should not drive for 6 months per the [place] DMV IF you receive appropriate therapy from your ICD.  ? ?ICD Alerts:  Some alerts are vibratory and others beep. These are NOT emergencies. Please call our office to let us know. If this occurs at night or on weekends, it can wait until the next business day. Send a remote transmission. ? ?If your device is capable of reading fluid status (for heart failure), you will be offered monthly monitoring to review this with you.  ? ?DEVICE MANAGEMENT ?Remote monitoring is used to monitor your ICD from home. This monitoring is scheduled every 91 days by our office. It allows [place] to keep an eye on the functioning of your device to ensure it is working properly. You will routinely see your Electrophysiologist annually (more often if necessary).  ? ?You should receive your ID card for your new device in 4-8 weeks. Keep this card with you at all times once received. Consider wearing a medical alert bracelet or necklace. ? ?Your ICD  may be MRI compatible. This will be discussed at your next  office visit/wound check.  You should avoid contact with strong electric or magnetic fields.  ? ?Do not use amateur (ham) radio equipment or electric (arc) welding torches. MP3 player headphones with magnets should not be used. Some devices are safe to use if held at least 12 inches (30 cm) from your defibrillator. These include power tools, lawn mowers, and speakers. If you are unsure if something is safe to use, ask your health care provider. ? ?When using your cell phone, hold it to the ear that is on the opposite side from the  defibrillator. Do not leave your cell phone in a pocket over the defibrillator. ? ?You may safely use electric blankets, heating pads, computers, and microwave ovens. ? ?Call the office right away if: ?You have chest pain. ?You feel more than one shock. ?You feel more short of breath than you have felt before. ?You feel more light-headed than you have felt before. ?Your incision starts to open up. ? ?This information is not intended to replace advice given to you by your health care provider. Make sure you discuss any questions you have with your health care provider. ? ? ? ? ?

## 2021-07-26 LAB — BPAM RBC
Blood Product Expiration Date: 202304062359
Blood Product Expiration Date: 202304062359
Blood Product Expiration Date: 202304102359
Blood Product Expiration Date: 202304112359
ISSUE DATE / TIME: 202303071604
ISSUE DATE / TIME: 202303071604
Unit Type and Rh: 1700
Unit Type and Rh: 1700
Unit Type and Rh: 1700
Unit Type and Rh: 1700

## 2021-07-26 LAB — TYPE AND SCREEN
ABO/RH(D): B NEG
Antibody Screen: NEGATIVE
Unit division: 0
Unit division: 0
Unit division: 0
Unit division: 0

## 2021-07-27 ENCOUNTER — Encounter (HOSPITAL_COMMUNITY): Payer: Self-pay | Admitting: Internal Medicine

## 2021-07-31 ENCOUNTER — Other Ambulatory Visit: Payer: Self-pay

## 2021-07-31 ENCOUNTER — Ambulatory Visit (INDEPENDENT_AMBULATORY_CARE_PROVIDER_SITE_OTHER): Payer: Medicare Other

## 2021-07-31 ENCOUNTER — Telehealth: Payer: Self-pay | Admitting: Internal Medicine

## 2021-07-31 DIAGNOSIS — Z95810 Presence of automatic (implantable) cardiac defibrillator: Secondary | ICD-10-CM

## 2021-07-31 DIAGNOSIS — Z5181 Encounter for therapeutic drug level monitoring: Secondary | ICD-10-CM

## 2021-07-31 LAB — POCT INR: INR: 1.1 — AB (ref 2.0–3.0)

## 2021-07-31 NOTE — Telephone Encounter (Signed)
Patient called and mentioned that he wanted to talk with Dr. Ladona Ridgel or nurse in regards to his pacemaker. Please call back ?

## 2021-07-31 NOTE — Telephone Encounter (Signed)
Patient called and states he has pulsating under his left breast area that he describes as a heartbeat. He states it is there when he sits, and that it is "heavier" when he stands. He denies any pain. ? ?He recently had the lead replaced on his pacemaker on 07/24/21. ? ?Will forward to device clinic to review. ?

## 2021-07-31 NOTE — Patient Instructions (Signed)
Description   ?Take 1 tablet today and 1.5 tablets tomorrow and then continue taking  0.5 tablet daily except 1 tablet on Sunday, Monday, Wednesday, and Fridays.   ?Recheck INR in 1 week. Call Coumadin Clinic if placed on any new medications or scheduled for any procedures - [phone];  ? ?Amio discontinued.  ?  ?   ?

## 2021-07-31 NOTE — Telephone Encounter (Signed)
Successful telephone encounter to patient to follow up on his complaint of "pulsating under his left breast area when he sits or stands". Post gen change and RV lead extraction/reimplant 07/24/21. Patient states he feels his heartbeat in his left side and can see his side pulsate when he sits or stands up. Symptoms diminish with laying. Suggested patient may need to come into clinic to check for stim. Patient states he has appointment in clinic with R. Charlcie Cradle 3/21 and prefers to wait if possible. Denies pain and described it a more "annoyance". Requested manual transmission. Impedance, and sensing WNL. Noted RV threshold is 2.5 and device is programmed at 5.0. This is a change from remote post gen change when RV threshold was 1.125@0 .4 and device programmed at 3.5V. Discussed with Dr. Lovena Le who agrees with CXR to assess for lead placement. Order placed and patient made aware. Will follow up once CXR has resulted.  ? ? ? ? ?

## 2021-08-01 ENCOUNTER — Ambulatory Visit
Admission: RE | Admit: 2021-08-01 | Discharge: 2021-08-01 | Disposition: A | Discharge status: Home / Self Care | Payer: Medicare Other | Source: Ambulatory Visit | Attending: Internal Medicine | Admitting: Internal Medicine

## 2021-08-01 DIAGNOSIS — Z95810 Presence of automatic (implantable) cardiac defibrillator: Secondary | ICD-10-CM

## 2021-08-05 NOTE — Progress Notes (Addendum)
? ?Cardiology Office Note ?Date:  08/05/2021  ?Patient ID:  Joseph Carson, Joseph Carson 1956-02-06, MRN [id number] ?PCP:  [name], DO  ?Electrophysiologist: Dr. Lovena Le ? ? ?  ?Chief Complaint:  wound check/diaphragmatic stim ? ?History of Present Illness: ?Joseph Carson is a 66 y.o. male with history of VT, ICD, AFib, CAD (CABG 2014), chronic CHF (combined), HTN, HLD, LBBB, LV thrombus (2015). ? ?He had a hospitalization 11/30/20 with multiple ICD shocks appropriate 2/2 VT/VF, recent/ongoing symptoms of cough/chills, feeling unwell. ?Admitted with VT/VF, COVID +, not felt to be volume OL ?Seen by EP, Dr. Quentin Ore, suspect COVID as one of the main drivers for recent functional decline and lead into his VT/VF, shocks. ?[name] was stopped and started amiodarone. ?Discharged 12/03/20 ? ?He had f/u with Dr. Lovena Le , mentioned noise on the A lead with plans to monitor, discussed appropriate therapies for VT, reminded of no driving, no changes were made ? ?Device clinic note 03/15/21 for an alert 2/2 RV defib impedance,  >160 on 03/13/21.  lead impedence testing today reflected RV defib impedence of 96 ohms consistent with trends, no changes were made. ? ?He had an ER visit 03/26/21 with progressive LE weakness, falls, neuroapthy sounding symptoms. ?In d/w myself recommended continue amioadrone, pending his neurology evaluation ?CK 166, B12 wnl ?Neuro saw him ?Discussed likely multifactorial with sedentary lifestyle and Recommended stopping lipitor and amiodarone ?If not improved further neuro eval recommended ? ?After d/w Dr. Lovena Le, cleared to stop amio and planned for EP follow up ?TSH quite high 30.704 ? ?03/30/21, T3 nml,T4 slightly low, LFTs were OK ? ?I saw him Nov 2022 ?He comes today accompanied by his daughter ?He feels like he is in fact feeling better, less SOB and legs starting to feel stronger as well. ?He report that shortly after starting amiodarone he started feeling a bit breathless, this has  been about the same , his legs though fairly steadily in the last month or so have felt weak.  A couple weeks ago as he was getting out of bed noticed his legs felt strange/weak, but able to get his strength under him/ ?The day of his ER visit he had gone to the bathroom, his legs very weak and then fell ?He did not faint, was not lightheaded, his legs gave out ?They were so weak he was unable to get back up and drug himself back to his bedroom, finally able to get back in bed until the AM when his daught was awake she was able to help him and they went tot he ER. ?Otherwise, he has felt well ?No CP, palpitations or cardiac awareness ?No SOB ?No bleeding or signs of bleeding ?Off amio and statin, was staring to feel better ?Planned to see dr. Lovena Le in a couple months to revisit AAD choices/need. ? ?He saw Dr. Lovena Le 05/29/21, He had a device alert (beeping note by thept) for elevated HV impedence, and known A lead noise. ?No changes were made, planned to monitor device function closely. ? ?ER visit  06/03/21 for device beeping again, again had an elevated HV impedence, parameters adjusted from > 160 to >200 Ohms. ?No other acute medical issues noted and discharged from the ER, BP looked like it may tolerate low dose ARB at that time and losartan added. ? ?I saw him 06/15/21 ?He is doing very well ?No CP, palpitations ?No rest SOB, minimal DOE with increased exertion, like going up/down the stairs a few times. ?No near syncope or syncope. ?He  saw his PMD yesterday with plans to repeat TSH/thyroid testing in a few weeks ?He has not heard his device alert since the ER ?Device check ok with no alerts, stable measurements ? ?Continued to have high impedance issues on his RV lead , bridged with a life vest, underwent extraction of RV lead extraction, new lead implant and gen change > sub-pectorial 07/24/21 ? ?07/31/21: pt called with what sounded like diaphragmatic stim, transmission noted RV threshold UP from implant and auto  threshold increased outputs to  5.0V, in d/w Dr. Lovena Le, planned for CXR ? ?CXR by radiologist and Dr. Lovena Le with stable lead position ? ?TODAY ?He comes in with ongoing diaphragmatic stim. ?Otherwise he reports doing quite well. ?NO CP, SOB ?No near syncope or syncope ?No bleeding ? ? ? ? ?Device information ?MDT CRT-D implanted 10/14/2018 ?RA and CS leads are from  09/01/2014 ?Original RV lead extracted 2/2 noise, 10/14/2018 ?RV lead extracted 07/24/21 w/gen change ?RV lead is now a BSCi lead ? ?AAD Hx ?Tikosyn stopped >>  ?Amiodarone started  >> stopped Nov 2022 2/2 myalgias ? ? ?Past Medical History:  ?Diagnosis Date  ? AICD (automatic cardioverter/defibrillator) present   ? Medtronic  ? Atrial fibrillation, chronic ([place])   ? CAD (coronary artery disease)   ? a. s/p CABG x 3 03/2013 (L-LAD, S-RI, S-PDA) in [place]  ? CAP (community acquired pneumonia) 08/22/2015  ? CHF (congestive heart failure) ([place])   ? Chronic combined systolic and diastolic heart failure ([place]) 03/2013  ? Full dentures   ? HLD (hyperlipidemia)    ? Hyperbilirubinemia   ? Hypothyroidism   ? Ischemic cardiomyopathy   ? a. echo (10/15):  EF 20%, diff HK, apical AK, mobile density inf wall near papillary muscle (?ruptured) with mod MR, mod reduced RVSF, mild PI, PASP 37 mmHg b. s/p CRTD   ? LBBB (left bundle branch block)    ? LV (left ventricular) mural thrombus    ? a. TEE (10/15):  EF 15-20%, septum and ant walls AK, lat wall HK, no papillary muscle rupture, probable small thrombus on ant/ant-septal wall, mild MR, mod decreased RVSF, neg bubble study  ? Mitral regurgitation    ? mild by TEE 02/2014  ? NSVT (nonsustained ventricular tachycardia)    ?    ? Obesity   ? Pulmonary hypertension ([place])   ? Umbilical hernia   ? "have had it since age [age]"  ? Wears glasses   ? ? ?Past Surgical History:  ?Procedure Laterality Date  ? BI-VENTRICULAR IMPLANTABLE CARDIOVERTER DEFIBRILLATOR N/A 09/01/2014  ? MDT CRTD implanted by Dr Lovena Le  ? CARDIAC  CATHETERIZATION  03/2013  ? CARDIOVERSION N/A 12/04/2017  ? Procedure: CARDIOVERSION;  Surgeon: Evans Lance, MD;  Location: [facility];  Service: Cardiovascular;  Laterality: N/A;  ? CORONARY ARTERY BYPASS GRAFT  03/2013  ? LIMA to the LAD, SVG to the ramus, SVG to the PDA  ? CORONARY ARTERY BYPASS GRAFT  2014  ? CABG "X3"  ? FRACTURE SURGERY    ? ICD LEAD REMOVAL N/A 08/14/2018  ? Procedure: ICD LEAD REMOVAL;  Surgeon: Evans Lance, MD;  Location: [facility];  Service: Cardiovascular;  Laterality: N/A;  ? ICD LEAD REMOVAL N/A 07/24/2021  ? Procedure: ICD LEAD EXTRACTION AND REPLACEMENT;  Surgeon: Evans Lance, MD;  Location: [facility];  Service: Cardiovascular;  Laterality: N/A;  ? IMPLANTABLE CARDIOVERTER DEFIBRILLATOR (ICD) GENERATOR CHANGE N/A 07/24/2021  ? Procedure: ICD GENERATOR CHANGE;  Surgeon: Lovena Le,  Champ Mungo, MD;  Location: [facility];  Service: Cardiovascular;  Laterality: N/A;  ? INSERT / REPLACE / REMOVE PACEMAKER  2016  ? LEAD INSERTION N/A 08/14/2018  ? Procedure: LEAD INSERTION;  Surgeon: Evans Lance, MD;  [facility];  Service: Cardiovascular;  Laterality: N/A;  ? LEAD REVISION/REPAIR N/A 12/04/2017  ? Procedure: LEAD REVISION/REPAIR;  Surgeon: Evans Lance, MD;  Location: [facility];  Service: Cardiovascular;  Laterality: N/A;  ? MULTIPLE TOOTH EXTRACTIONS    ? [place]  ? TEE WITHOUT CARDIOVERSION N/A 03/04/2014  ? Procedure: TRANSESOPHAGEAL ECHOCARDIOGRAM (TEE);  Surgeon: Larey Dresser, MD;  Location: [facility];  Service: Cardiovascular;  Laterality: N/A;  ? TEE WITHOUT CARDIOVERSION N/A 12/02/2017  ? Procedure: TRANSESOPHAGEAL ECHOCARDIOGRAM (TEE);  Surgeon: Buford Dresser, MD;  Location: [facility];  Service: Cardiovascular;  Laterality: N/A;  ? TEE WITHOUT CARDIOVERSION N/A 07/24/2021  ? Procedure: TRANSESOPHAGEAL ECHOCARDIOGRAM (TEE);  Surgeon: Evans Lance, MD;  Location: [facility];  Service: Cardiovascular;  Laterality: N/A;  ? ? ?Current  Outpatient Medications  ?Medication Sig Dispense Refill  ? acetaminophen (TYLENOL) 325 MG tablet Take 1-2 tablets (325-650 mg total) by mouth every 4 (four) hours as needed for mild pain.    ? carvedilol (COREG) 2

## 2021-08-07 ENCOUNTER — Other Ambulatory Visit: Payer: Self-pay

## 2021-08-07 ENCOUNTER — Encounter: Payer: Self-pay | Admitting: Physician Assistant

## 2021-08-07 ENCOUNTER — Ambulatory Visit (INDEPENDENT_AMBULATORY_CARE_PROVIDER_SITE_OTHER): Payer: Medicare Other | Admitting: Physician Assistant

## 2021-08-07 ENCOUNTER — Ambulatory Visit (INDEPENDENT_AMBULATORY_CARE_PROVIDER_SITE_OTHER): Payer: Medicare Other

## 2021-08-07 VITALS — BP 100/72 | HR 65 | Ht 70.0 in | Wt 214.0 lb

## 2021-08-07 DIAGNOSIS — Z5181 Encounter for therapeutic drug level monitoring: Secondary | ICD-10-CM

## 2021-08-07 DIAGNOSIS — I480 Paroxysmal atrial fibrillation: Secondary | ICD-10-CM | POA: Diagnosis not present

## 2021-08-07 DIAGNOSIS — I5022 Chronic systolic (congestive) heart failure: Secondary | ICD-10-CM | POA: Diagnosis not present

## 2021-08-07 DIAGNOSIS — I255 Ischemic cardiomyopathy: Secondary | ICD-10-CM

## 2021-08-07 DIAGNOSIS — Z95810 Presence of automatic (implantable) cardiac defibrillator: Secondary | ICD-10-CM

## 2021-08-07 DIAGNOSIS — I2510 Atherosclerotic heart disease of native coronary artery without angina pectoris: Secondary | ICD-10-CM

## 2021-08-07 DIAGNOSIS — I472 Ventricular tachycardia, unspecified: Secondary | ICD-10-CM | POA: Diagnosis not present

## 2021-08-07 LAB — CUP PACEART INCLINIC DEVICE CHECK
Battery Remaining Longevity: 94 mo
Battery Voltage: 3.04 V
Brady Statistic AP VP Percent: 26.69 %
Brady Statistic AP VS Percent: 0.07 %
Brady Statistic AS VP Percent: 72.94 %
Brady Statistic AS VS Percent: 0.3 %
Brady Statistic RA Percent Paced: 26.29 %
Brady Statistic RV Percent Paced: 96.82 %
Date Time Interrogation Session: 20230321172511
HighPow Impedance: 60 Ohm
Implantable Lead Implant Date: 20160414
Implantable Lead Implant Date: 20160414
Implantable Lead Implant Date: 20230307
Implantable Lead Location: 753858
Implantable Lead Location: 753859
Implantable Lead Location: 753860
Implantable Lead Model: 138
Implantable Lead Model: 4398
Implantable Lead Model: 5076
Implantable Lead Serial Number: 302991
Implantable Pulse Generator Implant Date: 20230307
Lead Channel Impedance Value: 207.273
Lead Channel Impedance Value: 214.783
Lead Channel Impedance Value: 218.298
Lead Channel Impedance Value: 237.12 Ohm
Lead Channel Impedance Value: 241.412
Lead Channel Impedance Value: 342 Ohm
Lead Channel Impedance Value: 380 Ohm
Lead Channel Impedance Value: 380 Ohm
Lead Channel Impedance Value: 456 Ohm
Lead Channel Impedance Value: 456 Ohm
Lead Channel Impedance Value: 494 Ohm
Lead Channel Impedance Value: 513 Ohm
Lead Channel Impedance Value: 513 Ohm
Lead Channel Impedance Value: 722 Ohm
Lead Channel Impedance Value: 760 Ohm
Lead Channel Impedance Value: 760 Ohm
Lead Channel Impedance Value: 817 Ohm
Lead Channel Impedance Value: 817 Ohm
Lead Channel Pacing Threshold Amplitude: 0.625 V
Lead Channel Pacing Threshold Amplitude: 0.875 V
Lead Channel Pacing Threshold Amplitude: 1.5 V
Lead Channel Pacing Threshold Pulse Width: 0.4 ms
Lead Channel Pacing Threshold Pulse Width: 0.4 ms
Lead Channel Pacing Threshold Pulse Width: 0.4 ms
Lead Channel Sensing Intrinsic Amplitude: 0.75 mV
Lead Channel Sensing Intrinsic Amplitude: 0.875 mV
Lead Channel Sensing Intrinsic Amplitude: 13.375 mV
Lead Channel Sensing Intrinsic Amplitude: 15.5 mV
Lead Channel Setting Pacing Amplitude: 1.5 V
Lead Channel Setting Pacing Amplitude: 1.75 V
Lead Channel Setting Pacing Amplitude: 2.5 V
Lead Channel Setting Pacing Pulse Width: 0.4 ms
Lead Channel Setting Pacing Pulse Width: 0.6 ms
Lead Channel Setting Sensing Sensitivity: 0.3 mV

## 2021-08-07 LAB — POCT INR: INR: 1.2 — AB (ref 2.0–3.0)

## 2021-08-07 NOTE — Patient Instructions (Signed)
Description   ?Take 1 tablet today and 1.5 tablets tomorrow and then START taking 1 tablet daily EXCEPT 0.5 on Tuesdays and Saturdays.  ?Recheck INR in 1 week.  ?Call Coumadin Clinic if placed on any new medications or scheduled for any procedures - [phone];  ? ?Amio discontinued.  ?  ?   ?

## 2021-08-07 NOTE — Patient Instructions (Signed)
Medication Instructions:  ° °Your physician recommends that you continue on your current medications as directed. Please refer to the Current Medication list given to you today. ° ° °*If you need a refill on your cardiac medications before your next appointment, please call your pharmacy* ° ° °Lab Work: NONE ORDERED  TODAY ° ° ° °If you have labs (blood work) drawn today and your tests are completely normal, you will receive your results only by: °MyChart Message (if you have MyChart) OR °A paper copy in the mail °If you have any lab test that is abnormal or we need to change your treatment, we will call you to review the results. ° ° °Testing/Procedures: NONE ORDERED  TODAY ° ° ° °Follow-Up: °At [facility], you and your health needs are our priority.  As part of our continuing mission to provide you with exceptional heart care, we have created designated Provider Care Teams.  These Care Teams include your primary Cardiologist (physician) and Advanced Practice Providers (APPs -  Physician Assistants and Nurse Practitioners) who all work together to provide you with the care you need, when you need it. ° °We recommend signing up for the patient portal called "MyChart".  Sign up information is provided on this After Visit Summary.  MyChart is used to connect with patients for Virtual Visits (Telemedicine).  Patients are able to view lab/test results, encounter notes, upcoming appointments, etc.  Non-urgent messages can be sent to your provider as well.   °To learn more about what you can do with MyChart, go to [email or web address].   ° °Your next appointment:   °2 week(s) ° °The format for your next appointment:   °In Person ° °Provider:   °Renee Ursuy, PA-C  ° °Other Instructions ° °

## 2021-08-14 ENCOUNTER — Ambulatory Visit (INDEPENDENT_AMBULATORY_CARE_PROVIDER_SITE_OTHER): Payer: Medicare Other

## 2021-08-14 ENCOUNTER — Other Ambulatory Visit: Payer: Self-pay

## 2021-08-14 DIAGNOSIS — Z5181 Encounter for therapeutic drug level monitoring: Secondary | ICD-10-CM | POA: Diagnosis not present

## 2021-08-14 DIAGNOSIS — I513 Intracardiac thrombosis, not elsewhere classified: Secondary | ICD-10-CM | POA: Diagnosis not present

## 2021-08-14 LAB — POCT INR: INR: 1.2 — AB (ref 2.0–3.0)

## 2021-08-14 NOTE — Patient Instructions (Addendum)
Description   ?Take 2 tablets today and then START taking 1 tablet (5mg ) daily EXCEPT 0.5 tablet (2.5mg ) on Monday, Wednesdays, and Fridays.  ?Recheck INR in 1 week.  ?Call Coumadin Clinic if placed on any new medications or scheduled for any procedures - [phone];  ? ?Amio discontinued.  ?  ?   ?

## 2021-08-16 NOTE — Op Note (Signed)
TCTS ? ?I provided surgical backup for this ICD lead extraction for 83 minutes. ? ?Gaye Pollack, MD ?

## 2021-08-20 NOTE — Progress Notes (Signed)
? ?Cardiology Office Note ?Date:  08/21/2021  ?Patient ID:  Joseph Carson, Joseph Carson Oct 06, 1955, MRN [id number] ?PCP:  [name], DO  ?Electrophysiologist: Dr. Lovena Le ? ? ?  ?Chief Complaint:  f/u on diaphragmatic stim ? ?History of Present Illness: ?Joseph Carson is a 66 y.o. male with history of VT, ICD, AFib, CAD (CABG 2014), chronic CHF (combined), HTN, HLD, LBBB, LV thrombus (2015). ? ?He had a hospitalization 11/30/20 with multiple ICD shocks appropriate 2/2 VT/VF, recent/ongoing symptoms of cough/chills, feeling unwell. ?Admitted with VT/VF, COVID +, not felt to be volume OL ?Seen by EP, Dr. Quentin Ore, suspect COVID as one of the main drivers for recent functional decline and lead into his VT/VF, shocks. ?[name] was stopped and started amiodarone. ?Discharged 12/03/20 ? ?He had f/u with Dr. Lovena Le , mentioned noise on the A lead with plans to monitor, discussed appropriate therapies for VT, reminded of no driving, no changes were made ? ?Device clinic note 03/15/21 for an alert 2/2 RV defib impedance,  >160 on 03/13/21.  lead impedence testing today reflected RV defib impedence of 96 ohms consistent with trends, no changes were made. ? ?He had an ER visit 03/26/21 with progressive LE weakness, falls, neuroapthy sounding symptoms. ?In d/w myself recommended continue amioadrone, pending his neurology evaluation ?CK 166, B12 wnl ?Neuro saw him ?Discussed likely multifactorial with sedentary lifestyle and Recommended stopping lipitor and amiodarone ?If not improved further neuro eval recommended ? ?After d/w Dr. Lovena Le, cleared to stop amio and planned for EP follow up ?TSH quite high 30.704 ? ?03/30/21, T3 nml,T4 slightly low, LFTs were OK ? ?I saw him Nov 2022 ?He comes today accompanied by his daughter ?He feels like he is in fact feeling better, less SOB and legs starting to feel stronger as well. ?He report that shortly after starting amiodarone he started feeling a bit breathless, this has been  about the same , his legs though fairly steadily in the last month or so have felt weak.  A couple weeks ago as he was getting out of bed noticed his legs felt strange/weak, but able to get his strength under him/ ?The day of his ER visit he had gone to the bathroom, his legs very weak and then fell ?He did not faint, was not lightheaded, his legs gave out ?They were so weak he was unable to get back up and drug himself back to his bedroom, finally able to get back in bed until the AM when his daught was awake she was able to help him and they went tot he ER. ?Otherwise, he has felt well ?No CP, palpitations or cardiac awareness ?No SOB ?No bleeding or signs of bleeding ?Off amio and statin, was staring to feel better ?Planned to see dr. Lovena Le in a couple months to revisit AAD choices/need. ? ?He saw Dr. Lovena Le 05/29/21, He had a device alert (beeping note by thept) for elevated HV impedence, and known A lead noise. ?No changes were made, planned to monitor device function closely. ? ?ER visit  06/03/21 for device beeping again, again had an elevated HV impedence, parameters adjusted from > 160 to >200 Ohms. ?No other acute medical issues noted and discharged from the ER, BP looked like it may tolerate low dose ARB at that time and losartan added. ? ?I saw him 06/15/21 ?He is doing very well ?No CP, palpitations ?No rest SOB, minimal DOE with increased exertion, like going up/down the stairs a few times. ?No near syncope or syncope. ?  He saw his PMD yesterday with plans to repeat TSH/thyroid testing in a few weeks ?He has not heard his device alert since the ER ?Device check ok with no alerts, stable measurements ? ?Continued to have high impedance issues on his RV lead , bridged with a life vest, underwent extraction of RV lead extraction, new lead implant and gen change > sub-pectorial 07/24/21 ? ?07/31/21: pt called with what sounded like diaphragmatic stim, transmission noted RV threshold UP from implant and auto  threshold increased outputs to  5.0V, in d/w Dr. Lovena Le, planned for CXR ? ?CXR by radiologist and Dr. Lovena Le with stable lead position ? ?I saw him 08/07/21 ?He comes in with ongoing diaphragmatic stim. ?Otherwise he reports doing quite well. ?NO CP, SOB ?No near syncope or syncope ?No bleeding ?RV thresholds interestingly are unchanged despite PW ?1.25/0.4 ?1.25/0.6 ?1.25/0.8 ?1.25/1.0 ?He has diaphragmatic stim at voltage higher then 1.75 and all PW ?Auto threshold is turned off ?In d/w Dr. Lovena Le, given chronic LV lead and not dependent, will leave the RV lead with 1/2 V margin, programmed at 1.75/0.6 ?With industry help, RV lead is not a true bipolar lead, programmed tip-coil (this made no impact on pacing thresholds) ? ?TODAY ?He continues to have some intermittent stim, some minimal, others more intense. ?Otherwise doing well, no changes ?INRs are back in track, seems pharmacy gave him the wrong strength tablets ? ? ?Device information ?MDT CRT-D implanted 10/14/2018 ?RA and CS leads are from  09/01/2014 ?Original RV lead extracted 2/2 noise, 10/14/2018 ?RV lead extracted 07/24/21 w/gen change ?RV lead is now a BSCi lead ? ?AAD Hx ?Tikosyn stopped >>  ?Amiodarone started  >> stopped Nov 2022 2/2 myalgias ? ? ?Past Medical History:  ?Diagnosis Date  ? AICD (automatic cardioverter/defibrillator) present   ? [employer]  ? Atrial fibrillation, chronic ([place])   ? CAD (coronary artery disease)   ? a. s/p CABG x 3 03/2013 (L-LAD, S-RI, S-PDA) in [place]  ? CAP (community acquired pneumonia) 08/22/2015  ? CHF (congestive heart failure) ([place])   ? Chronic combined systolic and diastolic heart failure ([place]) 03/2013  ? Full dentures   ? HLD (hyperlipidemia)    ? Hyperbilirubinemia   ? Hypothyroidism   ? Ischemic cardiomyopathy   ? a. echo (10/15):  EF 20%, diff HK, apical AK, mobile density inf wall near papillary muscle (?ruptured) with mod MR, mod reduced RVSF, mild PI, PASP 37 mmHg b. s/p CRTD   ? LBBB (left bundle branch block)     ? LV (left ventricular) mural thrombus    ? a. TEE (10/15):  EF 15-20%, septum and ant walls AK, lat wall HK, no papillary muscle rupture, probable small thrombus on ant/ant-septal wall, mild MR, mod decreased RVSF, neg bubble study  ? Mitral regurgitation    ? mild by TEE 02/2014  ? NSVT (nonsustained ventricular tachycardia) ([place])    ?    ? Obesity   ? Pulmonary hypertension ([place])   ? Umbilical hernia   ? "have had it since age 59/24"  ? Wears glasses   ? ? ?Past Surgical History:  ?Procedure Laterality Date  ? BI-VENTRICULAR IMPLANTABLE CARDIOVERTER DEFIBRILLATOR N/A 09/01/2014  ? MDT CRTD implanted by Dr Lovena Le  ? CARDIAC CATHETERIZATION  03/2013  ? CARDIOVERSION N/A 12/04/2017  ? Procedure: CARDIOVERSION;  Surgeon: Evans Lance, MD;  Location: [place] [facility];  Service: Cardiovascular;  Laterality: N/A;  ? CORONARY ARTERY BYPASS GRAFT  03/2013  ? LIMA to the  LAD, SVG to the ramus, SVG to the PDA  ? CORONARY ARTERY BYPASS GRAFT  2014  ? CABG "X3"  ? FRACTURE SURGERY    ? ICD LEAD REMOVAL N/A 08/14/2018  ? Procedure: ICD LEAD REMOVAL;  Surgeon: Evans Lance, MD;  Location: [place];  Service: Cardiovascular;  Laterality: N/A;  ? ICD LEAD REMOVAL N/A 07/24/2021  ? Procedure: ICD LEAD EXTRACTION AND REPLACEMENT;  Surgeon: Evans Lance, MD;  Location: [place];  Service: Cardiovascular;  Laterality: N/A;  ? IMPLANTABLE CARDIOVERTER DEFIBRILLATOR (ICD) GENERATOR CHANGE N/A 07/24/2021  ? Procedure: ICD GENERATOR CHANGE;  Surgeon: Evans Lance, MD;  Location: [place];  Service: Cardiovascular;  Laterality: N/A;  ? INSERT / REPLACE / REMOVE PACEMAKER  2016  ? LEAD INSERTION N/A 08/14/2018  ? Procedure: LEAD INSERTION;  Surgeon: Evans Lance, MD;  Location: [facility];  Service: Cardiovascular;  Laterality: N/A;  ? LEAD REVISION/REPAIR N/A 12/04/2017  ? Procedure: LEAD REVISION/REPAIR;  Surgeon: Evans Lance, MD;  Location: [place] [facility];  Service: Cardiovascular;  Laterality: N/A;  ? MULTIPLE TOOTH EXTRACTIONS     ? [place]  ? TEE WITHOUT CARDIOVERSION N/A 03/04/2014  ? Procedure: TRANSESOPHAGEAL ECHOCARDIOGRAM (TEE);  Surgeon: Larey Dresser, MD;  Location: [place];  Service: Cardio

## 2021-08-21 ENCOUNTER — Ambulatory Visit (INDEPENDENT_AMBULATORY_CARE_PROVIDER_SITE_OTHER): Payer: Medicare Other | Admitting: Physician Assistant

## 2021-08-21 ENCOUNTER — Encounter: Payer: Self-pay | Admitting: Physician Assistant

## 2021-08-21 ENCOUNTER — Ambulatory Visit (INDEPENDENT_AMBULATORY_CARE_PROVIDER_SITE_OTHER): Payer: Medicare Other

## 2021-08-21 VITALS — BP 100/70 | HR 60 | Ht 70.0 in | Wt 216.0 lb

## 2021-08-21 DIAGNOSIS — Z95810 Presence of automatic (implantable) cardiac defibrillator: Secondary | ICD-10-CM | POA: Diagnosis not present

## 2021-08-21 DIAGNOSIS — I513 Intracardiac thrombosis, not elsewhere classified: Secondary | ICD-10-CM

## 2021-08-21 DIAGNOSIS — Z5181 Encounter for therapeutic drug level monitoring: Secondary | ICD-10-CM | POA: Diagnosis not present

## 2021-08-21 DIAGNOSIS — I2510 Atherosclerotic heart disease of native coronary artery without angina pectoris: Secondary | ICD-10-CM

## 2021-08-21 DIAGNOSIS — I472 Ventricular tachycardia, unspecified: Secondary | ICD-10-CM | POA: Diagnosis not present

## 2021-08-21 DIAGNOSIS — I480 Paroxysmal atrial fibrillation: Secondary | ICD-10-CM | POA: Diagnosis not present

## 2021-08-21 DIAGNOSIS — I5022 Chronic systolic (congestive) heart failure: Secondary | ICD-10-CM | POA: Diagnosis not present

## 2021-08-21 DIAGNOSIS — I255 Ischemic cardiomyopathy: Secondary | ICD-10-CM

## 2021-08-21 LAB — CUP PACEART INCLINIC DEVICE CHECK
Battery Remaining Longevity: 96 mo
Battery Voltage: 3.07 V
Brady Statistic AP VP Percent: 35.31 %
Brady Statistic AP VS Percent: 0.06 %
Brady Statistic AS VP Percent: 64.43 %
Brady Statistic AS VS Percent: 0.2 %
Brady Statistic RA Percent Paced: 34.52 %
Brady Statistic RV Percent Paced: 96.18 %
Date Time Interrogation Session: 20230404173608
HighPow Impedance: 69 Ohm
Implantable Lead Implant Date: 20160414
Implantable Lead Implant Date: 20160414
Implantable Lead Implant Date: 20230307
Implantable Lead Location: 753858
Implantable Lead Location: 753859
Implantable Lead Location: 753860
Implantable Lead Model: 138
Implantable Lead Model: 4398
Implantable Lead Model: 5076
Implantable Lead Serial Number: 302991
Implantable Pulse Generator Implant Date: 20230307
Lead Channel Impedance Value: 203.256
Lead Channel Impedance Value: 207.273
Lead Channel Impedance Value: 207.273
Lead Channel Impedance Value: 223.149
Lead Channel Impedance Value: 223.149
Lead Channel Impedance Value: 380 Ohm
Lead Channel Impedance Value: 399 Ohm
Lead Channel Impedance Value: 399 Ohm
Lead Channel Impedance Value: 437 Ohm
Lead Channel Impedance Value: 456 Ohm
Lead Channel Impedance Value: 456 Ohm
Lead Channel Impedance Value: 456 Ohm
Lead Channel Impedance Value: 456 Ohm
Lead Channel Impedance Value: 722 Ohm
Lead Channel Impedance Value: 722 Ohm
Lead Channel Impedance Value: 722 Ohm
Lead Channel Impedance Value: 760 Ohm
Lead Channel Impedance Value: 779 Ohm
Lead Channel Pacing Threshold Amplitude: 0.75 V
Lead Channel Pacing Threshold Amplitude: 0.875 V
Lead Channel Pacing Threshold Amplitude: 1.375 V
Lead Channel Pacing Threshold Pulse Width: 0.4 ms
Lead Channel Pacing Threshold Pulse Width: 0.4 ms
Lead Channel Pacing Threshold Pulse Width: 0.4 ms
Lead Channel Sensing Intrinsic Amplitude: 0.75 mV
Lead Channel Sensing Intrinsic Amplitude: 1 mV
Lead Channel Sensing Intrinsic Amplitude: 13.375 mV
Lead Channel Sensing Intrinsic Amplitude: 17.625 mV
Lead Channel Setting Pacing Amplitude: 1.5 V
Lead Channel Setting Pacing Amplitude: 1.5 V
Lead Channel Setting Pacing Amplitude: 2.5 V
Lead Channel Setting Pacing Pulse Width: 0.4 ms
Lead Channel Setting Pacing Pulse Width: 0.6 ms
Lead Channel Setting Sensing Sensitivity: 0.3 mV

## 2021-08-21 LAB — POCT INR: INR: 2.3 (ref 2.0–3.0)

## 2021-08-21 NOTE — Patient Instructions (Addendum)
Medication Instructions:   Your physician recommends that you continue on your current medications as directed. Please refer to the Current Medication list given to you today.  *If you need a refill on your cardiac medications before your next appointment, please call your pharmacy*   Lab Work: NONE ORDERED  TODAY   If you have labs (blood work) drawn today and your tests are completely normal, you will receive your results only by: . MyChart Message (if you have MyChart) OR . A paper copy in the mail If you have any lab test that is abnormal or we need to change your treatment, we will call you to review the results.   Testing/Procedures: NONE ORDERED  TODAY    Follow-Up: At [facility], you and your health needs are our priority.  As part of our continuing mission to provide you with exceptional heart care, we have created designated Provider Care Teams.  These Care Teams include your primary Cardiologist (physician) and Advanced Practice Providers (APPs -  Physician Assistants and Nurse Practitioners) who all work together to provide you with the care you need, when you need it.  We recommend signing up for the patient portal called "MyChart".  Sign up information is provided on this After Visit Summary.  MyChart is used to connect with patients for Virtual Visits (Telemedicine).  Patients are able to view lab/test results, encounter notes, upcoming appointments, etc.  Non-urgent messages can be sent to your provider as well.   To learn more about what you can do with MyChart, go to [email or web address].    Your next appointment:  AS SCHEDULED   Other Instructions   

## 2021-08-21 NOTE — Patient Instructions (Signed)
Description   ?Continue taking 1 tablet (5mg ) daily EXCEPT 0.5 tablet (2.5mg ) on Monday, Wednesdays, and Fridays.  ?Recheck INR in 3 weeks.  ?Call [facility] if placed on any new medications or scheduled for any procedures - [phone];  ? ?Amio discontinued.  ?  ?   ?

## 2021-09-11 ENCOUNTER — Ambulatory Visit (INDEPENDENT_AMBULATORY_CARE_PROVIDER_SITE_OTHER): Payer: Medicare Other

## 2021-09-11 DIAGNOSIS — Z5181 Encounter for therapeutic drug level monitoring: Secondary | ICD-10-CM | POA: Diagnosis not present

## 2021-09-11 DIAGNOSIS — I513 Intracardiac thrombosis, not elsewhere classified: Secondary | ICD-10-CM | POA: Diagnosis not present

## 2021-09-11 LAB — POCT INR: INR: 2.4 (ref 2.0–3.0)

## 2021-09-11 NOTE — Patient Instructions (Signed)
Description   ?Continue taking 1 tablet (5mg ) daily EXCEPT 0.5 tablet (2.5mg ) on Monday, Wednesdays, and Fridays.  ?Recheck INR in 5 weeks.  ?Call [facility] Clinic if placed on any new medications or scheduled for any procedures - [phone];  ? ?Amio discontinued.  ?  ?   ?

## 2021-10-06 ENCOUNTER — Other Ambulatory Visit: Payer: Self-pay | Admitting: Internal Medicine

## 2021-10-15 NOTE — Progress Notes (Unsigned)
Cardiology Office Note Date:  10/16/2021  Patient ID:  Joseph Carson, Joseph Carson [birth year], MRN [id number] PCP:  [name], DO  Electrophysiologist: Dr. Lovena Le     Chief Complaint:   planned f/u  History of Present Illness: Joseph Carson is a 66 y.o. male with history of VT, ICD, AFib, CAD (CABG 2014), chronic CHF (combined), HTN, HLD, LBBB, LV thrombus (2015).  He had a hospitalization 11/30/20 with multiple ICD shocks appropriate 2/2 VT/VF, recent/ongoing symptoms of cough/chills, feeling unwell. Admitted with VT/VF, COVID +, not felt to be volume OL Seen by EP, Dr. Quentin Ore, suspect COVID as one of the main drivers for recent functional decline and lead into his VT/VF, shocks. Tiksoyn was stopped and started amiodarone. Discharged 12/03/20  He had f/u with Dr. Lovena Le , mentioned noise on the A lead with plans to monitor, discussed appropriate therapies for VT, reminded of no driving, no changes were made  Device clinic note 03/15/21 for an alert 2/2 RV defib impedance,  >160 on 03/13/21.  lead impedence testing today reflected RV defib impedence of 96 ohms consistent with trends, no changes were made.  He had an ER visit 03/26/21 with progressive LE weakness, falls, neuroapthy sounding symptoms. In d/w myself recommended continue amioadrone, pending his neurology evaluation CK 166, B12 wnl Neuro saw him Discussed likely multifactorial with sedentary lifestyle and Recommended stopping lipitor and amiodarone If not improved further neuro eval recommended  After d/w Dr. Lovena Le, cleared to stop amio and planned for EP follow up TSH quite high 30.704  03/30/21, T3 nml,T4 slightly low, LFTs were OK  I saw him Nov 2022 He comes today accompanied by his daughter He feels like he is in fact feeling better, less SOB and legs starting to feel stronger as well. He report that shortly after starting amiodarone he started feeling a bit breathless, this has been about the same  , his legs though fairly steadily in the last month or so have felt weak.  A couple weeks ago as he was getting out of bed noticed his legs felt strange/weak, but able to get his strength under him/ The day of his ER visit he had gone to the bathroom, his legs very weak and then fell He did not faint, was not lightheaded, his legs gave out They were so weak he was unable to get back up and drug himself back to his bedroom, finally able to get back in bed until the AM when his daught was awake she was able to help him and they went tot he ER. Otherwise, he has felt well No CP, palpitations or cardiac awareness No SOB No bleeding or signs of bleeding Off amio and statin, was staring to feel better Planned to see dr. Lovena Le in a couple months to revisit AAD choices/need.  He saw Dr. Lovena Le 05/29/21, He had a device alert (beeping note by thept) for elevated HV impedence, and known A lead noise. No changes were made, planned to monitor device function closely.  ER visit  06/03/21 for device beeping again, again had an elevated HV impedence, parameters adjusted from > 160 to >200 Ohms. No other acute medical issues noted and discharged from the ER, BP looked like it may tolerate low dose ARB at that time and losartan added.  I saw him 06/15/21 He is doing very well No CP, palpitations No rest SOB, minimal DOE with increased exertion, like going up/down the stairs a few times. No near syncope or syncope. He  saw his PMD yesterday with plans to repeat TSH/thyroid testing in a few weeks He has not heard his device alert since the ER Device check ok with no alerts, stable measurements  Continued to have high impedance issues on his RV lead , bridged with a life vest, underwent extraction of RV lead extraction, new lead implant and gen change > sub-pectorial 07/24/21  07/31/21: pt called with what sounded like diaphragmatic stim, transmission noted RV threshold UP from implant and auto threshold increased  outputs to  5.0V, in d/w Dr. Lovena Le, planned for CXR  CXR by radiologist and Dr. Lovena Le with stable lead position  I saw him 08/07/21 He comes in with ongoing diaphragmatic stim. Otherwise he reports doing quite well. NO CP, SOB No near syncope or syncope No bleeding RV thresholds interestingly are unchanged despite PW 1.25/0.4 1.25/0.6 1.25/0.8 1.25/1.0 He has diaphragmatic stim at voltage higher then 1.75 and all PW Auto threshold is turned off In d/w Dr. Lovena Le, given chronic LV lead and not dependent, will leave the RV lead with 1/2 V margin, programmed at 1.75/0.6 With industry help, RV lead is not a true bipolar lead, programmed tip-coil (this made no impact on pacing thresholds)  I saw him 08/21/21 He continues to have some intermittent stim, some minimal, others more intense. Otherwise doing well, no changes INRs are back in track, seems pharmacy gave him the wrong strength tablets Threshold was slightly improved and outputs decreased some.  TODAY All in all, doing well No CP, palpitations or SOB Feels a little drained and wonders about his TSH, has not seen his PMD or had it checked in a couple months, wonders if we can for him. No near syncope or syncope. No bleeding or signs of bleeding He has some infrequent diaphragmatic stim, tends to happen when sitting on a hard seat/certain position, very tolerable   His LE weakness/discomfort is completely resolved, he thinks was the amiodarone, having done very well on atorvastatin for years  He wanted to keep today's appt, thinks his ICD may have moved down some.  (Site looks good, he has noted the leads, likely more prominent as swelling/healing is completed, looks OK)    Device information MDT CRT-D implanted 10/14/2018 RA and CS leads are from  09/01/2014 Original RV lead extracted 2/2 noise, 10/14/2018 RV lead extracted 07/24/21 w/gen change RV lead is now a BSCi lead  AAD Hx Tikosyn stopped >> July 2022 2/2 PMVT/VF  (during COVID illness) Amiodarone started  July 2022 >> stopped Nov 2022 2/2 severe myalgias   Past Medical History:  Diagnosis Date   AICD (automatic cardioverter/defibrillator) present    [employer]   Atrial fibrillation, chronic (HCC)    CAD (coronary artery disease)    a. s/p CABG x 3 03/2013 (L-LAD, S-RI, S-PDA) in [place]   CAP (community acquired pneumonia) 08/22/2015   CHF (congestive heart failure) (HCC)    Chronic combined systolic and diastolic heart failure ([place]) 03/2013   Full dentures    HLD (hyperlipidemia)     Hyperbilirubinemia    Hypothyroidism    Ischemic cardiomyopathy    a. echo (10/15):  EF 20%, diff HK, apical AK, mobile density inf wall near papillary muscle (?ruptured) with mod MR, mod reduced RVSF, mild PI, PASP 37 mmHg b. s/p CRTD    LBBB (left bundle branch block)     LV (left ventricular) mural thrombus     a. TEE (10/15):  EF 15-20%, septum and ant walls AK, lat wall HK,  no papillary muscle rupture, probable small thrombus on ant/ant-septal wall, mild MR, mod decreased RVSF, neg bubble study   Mitral regurgitation     mild by TEE 02/2014   NSVT (nonsustained ventricular tachycardia) (HCC)         Obesity    Pulmonary hypertension ([place])    Umbilical hernia    "have had it since age 79/24"   Wears glasses     Past Surgical History:  Procedure Laterality Date   BI-VENTRICULAR IMPLANTABLE CARDIOVERTER DEFIBRILLATOR N/A 09/01/2014   MDT CRTD implanted by Dr Lovena Le   CARDIAC CATHETERIZATION  03/2013   CARDIOVERSION N/A 12/04/2017   Procedure: CARDIOVERSION;  Surgeon: Evans Lance, MD;  Location: [facility];  Service: Cardiovascular;  Laterality: N/A;   CORONARY ARTERY BYPASS GRAFT  03/2013   LIMA to the LAD, SVG to the ramus, SVG to the PDA   CORONARY ARTERY BYPASS GRAFT  2014   CABG "X3"   FRACTURE SURGERY     ICD LEAD REMOVAL N/A 08/14/2018   Procedure: ICD LEAD REMOVAL;  Surgeon: Evans Lance, MD;  Location: [facility];  Service:  Cardiovascular;  Laterality: N/A;   ICD LEAD REMOVAL N/A 07/24/2021   Procedure: ICD LEAD EXTRACTION AND REPLACEMENT;  Surgeon: Evans Lance, MD;  Location: [facility];  Service: Cardiovascular;  Laterality: N/A;   IMPLANTABLE CARDIOVERTER DEFIBRILLATOR (ICD) GENERATOR CHANGE N/A 07/24/2021   Procedure: ICD GENERATOR CHANGE;  Surgeon: Evans Lance, MD;  Location: [facility];  Service: Cardiovascular;  Laterality: N/A;   INSERT / REPLACE / REMOVE PACEMAKER  2016   LEAD INSERTION N/A 08/14/2018   Procedure: LEAD INSERTION;  Surgeon: Evans Lance, MD;  Location: [place];  Service: Cardiovascular;  Laterality: N/A;   LEAD REVISION/REPAIR N/A 12/04/2017   Procedure: LEAD REVISION/REPAIR;  Surgeon: Evans Lance, MD;  Location: [facility];  Service: Cardiovascular;  Laterality: N/A;   MULTIPLE TOOTH EXTRACTIONS     PATELLA FRACTURE SURGERY Left 1972   TEE WITHOUT CARDIOVERSION N/A 03/04/2014   Procedure: TRANSESOPHAGEAL ECHOCARDIOGRAM (TEE);  Surgeon: Larey Dresser, MD;  Location: [facility];  Service: Cardiovascular;  Laterality: N/A;   TEE WITHOUT CARDIOVERSION N/A 12/02/2017   Procedure: TRANSESOPHAGEAL ECHOCARDIOGRAM (TEE);  Surgeon: Buford Dresser, MD;  Location: [facility];  Service: Cardiovascular;  Laterality: N/A;   TEE WITHOUT CARDIOVERSION N/A 07/24/2021   Procedure: TRANSESOPHAGEAL ECHOCARDIOGRAM (TEE);  Surgeon: Evans Lance, MD;  Location: [facility];  Service: Cardiovascular;  Laterality: N/A;    Current Outpatient Medications  Medication Sig Dispense Refill   acetaminophen (TYLENOL) 325 MG tablet Take 1-2 tablets (325-650 mg total) by mouth every 4 (four) hours as needed for mild pain.     carvedilol (COREG) 25 MG tablet Take 1 tablet (25 mg total) by mouth 2 (two) times daily with a meal. 180 tablet 1   furosemide (LASIX) 40 MG tablet TAKE 1 TABLET BY MOUTH DAILY 90 tablet 3   levothyroxine (SYNTHROID) 50 MCG tablet Take 1 tablet (50 mcg total) by mouth daily before  breakfast. 90 tablet 1   losartan (COZAAR) 25 MG tablet Take 0.5 tablets (12.5 mg total) by mouth daily. 45 tablet 1   Multiple Vitamins-Minerals (HAIR/SKIN/NAILS PO) Take 1 tablet by mouth daily.     spironolactone (ALDACTONE) 25 MG tablet TAKE 1 TABLET BY MOUTH DAILY. 90 tablet 3   warfarin (COUMADIN) 5 MG tablet TAKE 1/2 TO 1 TABLET BY MOUTH DAILY AS DIRECTED BY COUMADIN CLINIC 90 tablet  1   No current facility-administered medications for this visit.    Allergies:   Patient has no known allergies.   Social History:  The patient  reports that he has been smoking cigarettes. He has a 11.75 pack-year smoking history. He has never used smokeless tobacco. He reports that he does not currently use alcohol. He reports current drug use. Drug: Marijuana.   Family History:  The patient's family history includes Diabetes in his mother; Thyroid disease in his mother.  ROS:  Please see the history of present illness.    All other systems are reviewed and otherwise negative.   PHYSICAL EXAM:  VS:  BP 132/82   Pulse 63   Ht 5\' 10"  (1.778 m)   Wt 213 lb 9.6 oz (96.9 kg)   SpO2 96%   BMI 30.65 kg/m  BMI: Body mass index is 30.65 kg/m. Well nourished, well developed, in no acute distress HEENT: normocephalic, atraumatic Neck: no JVD, carotid bruits or masses Cardiac:  RRR; extrasystoles no significant murmurs, no rubs, or gallops Lungs:  CTA b/l, no wheezing, rhonchi or rales Abd: soft, nontender MS: no deformity or atrophy Ext: no edema Skin: warm and dry, no rash Neuro:  No gross deficits appreciated Psych: euthymic mood, full affect  ICD site: well healed, no tethering, edema, skin changes  EKG:  done today and reviewed by myself SR, VP, PACs, an AV paced beat  Device interrogation done today and reviewed by myself:  Battery and lead measurements are good 2 very brief Afib episodes No VT BP 96.1%, effective 96%   12/02/2020: TTE IMPRESSIONS   1. Left ventricular ejection  fraction, by estimation, is <20%. The left  ventricle has severely decreased function. The left ventricle demonstrates  global hypokinesis. The left ventricular internal cavity size was severely  dilated. Left ventricular  diastolic parameters are consistent with Grade I diastolic dysfunction  (impaired relaxation).   2. Right ventricular systolic function is normal. The right ventricular  size is normal. There is normal pulmonary artery systolic pressure. The  estimated right ventricular systolic pressure is [id number] mmHg.   3. Left atrial size was moderately dilated.   4. Right atrial size was mildly dilated.   5. The mitral valve is normal in structure. Mild mitral valve  regurgitation. No evidence of mitral stenosis.   6. The aortic valve is normal in structure. Aortic valve regurgitation is  not visualized. No aortic stenosis is present.   7. The inferior vena cava is normal in size with greater than 50%  respiratory variability, suggesting right atrial pressure of 3 mmHg.    Recent Labs: 03/26/2021: Magnesium 1.9 06/27/2021: ALT 13 06/28/2021: BUN 18; Creatinine, Ser 1.06; Hemoglobin 13.0; Platelets 189; Potassium 3.5; Sodium 136 07/18/2021: TSH 9.820  06/28/2021: Cholesterol 139; HDL 19; LDL Cholesterol 94; Total CHOL/HDL Ratio 7.3; Triglycerides 128; VLDL 26   CrCl cannot be calculated (Patient's most recent lab result is older than the maximum 21 days allowed.).   Wt Readings from Last 3 Encounters:  10/16/21 213 lb 9.6 oz (96.9 kg)  08/21/21 216 lb (98 kg)  08/07/21 214 lb (97.1 kg)     Other studies reviewed: Additional studies/records reviewed today include: summarized above  ASSESSMENT AND PLAN:  ICD Intact function   VT Admitted July 2022 with multiple appropriate shocks, started on amiodarone Amio stopped Nov 2022 2/2 myalgias (lipitor also stopped) PMD managing TSH/synthroid No VT  CAD No anginal symptoms No ASA w/warfarin Off statin w/severe myalgias (also was  on amio)  Discussed this today He did well on atorvastatin for years, suspect the amio or the combination of it/statin is what caused the muscle weakness/myalgias)  Will try back low dose atorvastatin and check LFTs/lipids in a couple months  Chronic CHF (systolic) ICM No symptoms or exam findings of volume OL OptiVol looks OK EKG today with variable paced morphology, I think is 2/2 ectopy His effective BP is still good Discussed increasing his coreg to 37.5mg  BID, he is a little reluctant, would like [place] to check his TSH 1st He sees Dr. Lovena Le in a couple weeks  Paroxysmal Afib CHA2DS2Vasc is 6, on warfarin <0.1 % burden Off tiksoyn July 2022 in setting of VT/VF Off amiodarone Nov 2022 2/2 myalgias No AAD currently   HTN Looks ok     Disposition: as above  Current medicines are reviewed at length with the patient today.  The patient did not have any concerns regarding medicines.  Venetia Night, PA-C 10/16/2021 12:37 PM     [address] [place] [id number] [phone] (office)  [phone] (fax)

## 2021-10-16 ENCOUNTER — Ambulatory Visit (INDEPENDENT_AMBULATORY_CARE_PROVIDER_SITE_OTHER): Payer: Medicare Other

## 2021-10-16 ENCOUNTER — Ambulatory Visit (INDEPENDENT_AMBULATORY_CARE_PROVIDER_SITE_OTHER): Payer: Medicare Other | Admitting: Physician Assistant

## 2021-10-16 ENCOUNTER — Encounter: Payer: Self-pay | Admitting: Physician Assistant

## 2021-10-16 VITALS — BP 132/82 | HR 63 | Ht 70.0 in | Wt 213.6 lb

## 2021-10-16 DIAGNOSIS — I480 Paroxysmal atrial fibrillation: Secondary | ICD-10-CM | POA: Diagnosis not present

## 2021-10-16 DIAGNOSIS — Z79899 Other long term (current) drug therapy: Secondary | ICD-10-CM | POA: Diagnosis not present

## 2021-10-16 DIAGNOSIS — I472 Ventricular tachycardia, unspecified: Secondary | ICD-10-CM

## 2021-10-16 DIAGNOSIS — E032 Hypothyroidism due to medicaments and other exogenous substances: Secondary | ICD-10-CM | POA: Diagnosis not present

## 2021-10-16 DIAGNOSIS — I10 Essential (primary) hypertension: Secondary | ICD-10-CM

## 2021-10-16 DIAGNOSIS — I5022 Chronic systolic (congestive) heart failure: Secondary | ICD-10-CM

## 2021-10-16 DIAGNOSIS — I2510 Atherosclerotic heart disease of native coronary artery without angina pectoris: Secondary | ICD-10-CM

## 2021-10-16 DIAGNOSIS — I513 Intracardiac thrombosis, not elsewhere classified: Secondary | ICD-10-CM | POA: Diagnosis not present

## 2021-10-16 DIAGNOSIS — Z5181 Encounter for therapeutic drug level monitoring: Secondary | ICD-10-CM

## 2021-10-16 DIAGNOSIS — Z95810 Presence of automatic (implantable) cardiac defibrillator: Secondary | ICD-10-CM

## 2021-10-16 LAB — CUP PACEART INCLINIC DEVICE CHECK
Battery Remaining Longevity: 97 mo
Battery Voltage: 3.05 V
Brady Statistic AP VP Percent: 31.01 %
Brady Statistic AP VS Percent: 0.07 %
Brady Statistic AS VP Percent: 68.64 %
Brady Statistic AS VS Percent: 0.28 %
Brady Statistic RA Percent Paced: 30.45 %
Brady Statistic RV Percent Paced: 96.09 %
Date Time Interrogation Session: 20230530132851
HighPow Impedance: 79 Ohm
Implantable Lead Implant Date: 20160414
Implantable Lead Implant Date: 20160414
Implantable Lead Implant Date: 20230307
Implantable Lead Location: 753858
Implantable Lead Location: 753859
Implantable Lead Location: 753860
Implantable Lead Model: 138
Implantable Lead Model: 4398
Implantable Lead Model: 5076
Implantable Lead Serial Number: 302991
Implantable Pulse Generator Implant Date: 20230307
Lead Channel Impedance Value: 212.8 Ohm
Lead Channel Impedance Value: 220.723
Lead Channel Impedance Value: 224.438
Lead Channel Impedance Value: 237.12 Ohm
Lead Channel Impedance Value: 241.412
Lead Channel Impedance Value: 399 Ohm
Lead Channel Impedance Value: 456 Ohm
Lead Channel Impedance Value: 494 Ohm
Lead Channel Impedance Value: 494 Ohm
Lead Channel Impedance Value: 494 Ohm
Lead Channel Impedance Value: 494 Ohm
Lead Channel Impedance Value: 513 Ohm
Lead Channel Impedance Value: 513 Ohm
Lead Channel Impedance Value: 722 Ohm
Lead Channel Impedance Value: 760 Ohm
Lead Channel Impedance Value: 760 Ohm
Lead Channel Impedance Value: 817 Ohm
Lead Channel Impedance Value: 817 Ohm
Lead Channel Pacing Threshold Amplitude: 0.625 V
Lead Channel Pacing Threshold Amplitude: 0.875 V
Lead Channel Pacing Threshold Amplitude: 1.125 V
Lead Channel Pacing Threshold Pulse Width: 0.4 ms
Lead Channel Pacing Threshold Pulse Width: 0.4 ms
Lead Channel Pacing Threshold Pulse Width: 0.4 ms
Lead Channel Sensing Intrinsic Amplitude: 0.75 mV
Lead Channel Sensing Intrinsic Amplitude: 1.625 mV
Lead Channel Sensing Intrinsic Amplitude: 16.75 mV
Lead Channel Sensing Intrinsic Amplitude: 20.625 mV
Lead Channel Setting Pacing Amplitude: 1.5 V
Lead Channel Setting Pacing Amplitude: 1.5 V
Lead Channel Setting Pacing Amplitude: 2.5 V
Lead Channel Setting Pacing Pulse Width: 0.4 ms
Lead Channel Setting Pacing Pulse Width: 0.6 ms
Lead Channel Setting Sensing Sensitivity: 0.3 mV

## 2021-10-16 LAB — POCT INR: INR: 3.2 — AB (ref 2.0–3.0)

## 2021-10-16 MED ORDER — ATORVASTATIN CALCIUM 20 MG PO TABS: 20.0000 mg | ORAL_TABLET | Freq: Every day | ORAL | 2 refills | Status: AC

## 2021-10-16 NOTE — Patient Instructions (Addendum)
Medication Instructions:    ATORVASTATIN 20 MG ONCE A DAY ATA BEDTIME   *If you need a refill on your cardiac medications before your next appointment, please call your pharmacy*   Lab Work:  TSH TODAY   RETURN : IN 2 MONTHS FOR LABS LFT AND LIPIDS ( FASTING )   If you have labs (blood work) drawn today and your tests are completely normal, you will receive your results only by: MyChart Message (if you have MyChart) OR A paper copy in the mail If you have any lab test that is abnormal or we need to change your treatment, we will call you to review the results.   Testing/Procedures: NONE ORDERED  TODAY   Follow-Up: At [facility], you and your health needs are our priority.  As part of our continuing mission to provide you with exceptional heart care, we have created designated Provider Care Teams.  These Care Teams include your primary Cardiologist (physician) and Advanced Practice Providers (APPs -  Physician Assistants and Nurse Practitioners) who all work together to provide you with the care you need, when you need it.  We recommend signing up for the patient portal called "MyChart".  Sign up information is provided on this After Visit Summary.  MyChart is used to connect with patients for Virtual Visits (Telemedicine).  Patients are able to view lab/test results, encounter notes, upcoming appointments, etc.  Non-urgent messages can be sent to your provider as well.   To learn more about what you can do with MyChart, go to [email or web address].    Your next appointment:  AS SCHEDULED    The format for your next appointment:   In Person  Provider:   Lewayne Bunting, MD    Other Instructions   Important Information About Sugar

## 2021-10-16 NOTE — Patient Instructions (Signed)
Description   Only take 0.5 tablet today and then continue taking 1 tablet (5mg ) daily EXCEPT 0.5 tablet (2.5mg ) on Monday, Wednesdays, and Fridays.  Recheck INR in 4 weeks.  Call Coumadin Clinic if placed on any new medications or scheduled for any procedures - [phone];   Amio discontinued.

## 2021-10-17 ENCOUNTER — Other Ambulatory Visit: Payer: Self-pay | Admitting: Internal Medicine

## 2021-10-17 ENCOUNTER — Telehealth: Payer: Self-pay | Admitting: *Deleted

## 2021-10-17 ENCOUNTER — Other Ambulatory Visit: Payer: Self-pay | Admitting: *Deleted

## 2021-10-17 DIAGNOSIS — I513 Intracardiac thrombosis, not elsewhere classified: Secondary | ICD-10-CM

## 2021-10-17 LAB — TSH: TSH: 4.76 u[IU]/mL — ABNORMAL HIGH (ref 0.450–4.500)

## 2021-10-17 NOTE — Telephone Encounter (Signed)
Prescription refill request received for warfarin Lov: 10/16/21 Charlcie Cradle)  Next INR check: 11/13/21 Warfarin tablet strength: 5mg   Appropriate dose and refill sent to requested pharmacy.

## 2021-10-17 NOTE — Telephone Encounter (Signed)
-----   Message from [facility] [place], [place] sent at 10/17/2021  8:25 AM EDT ----- TSH is near normal.  Please let him know and send to his PMD

## 2021-10-17 NOTE — Telephone Encounter (Signed)
Spoke with patient and aware of results and verbalized understanding  °

## 2021-10-25 ENCOUNTER — Ambulatory Visit (INDEPENDENT_AMBULATORY_CARE_PROVIDER_SITE_OTHER): Payer: Medicare Other

## 2021-10-25 DIAGNOSIS — I447 Left bundle-branch block, unspecified: Secondary | ICD-10-CM | POA: Diagnosis not present

## 2021-10-25 LAB — CUP PACEART REMOTE DEVICE CHECK
Battery Remaining Longevity: 94 mo
Battery Voltage: 3.05 V
Brady Statistic AP VP Percent: 35.94 %
Brady Statistic AP VS Percent: 0.07 %
Brady Statistic AS VP Percent: 63.65 %
Brady Statistic AS VS Percent: 0.34 %
Brady Statistic RA Percent Paced: 35.46 %
Brady Statistic RV Percent Paced: 95.9 %
Date Time Interrogation Session: 20230608022604
HighPow Impedance: 71 Ohm
Implantable Lead Implant Date: 20160414
Implantable Lead Implant Date: 20160414
Implantable Lead Implant Date: 20230307
Implantable Lead Location: 753858
Implantable Lead Location: 753859
Implantable Lead Location: 753860
Implantable Lead Model: 138
Implantable Lead Model: 4398
Implantable Lead Model: 5076
Implantable Lead Serial Number: 302991
Implantable Pulse Generator Implant Date: 20230307
Lead Channel Impedance Value: 199.5 Ohm
Lead Channel Impedance Value: 208.568
Lead Channel Impedance Value: 208.568
Lead Channel Impedance Value: 212.8 Ohm
Lead Channel Impedance Value: 212.8 Ohm
Lead Channel Impedance Value: 399 Ohm
Lead Channel Impedance Value: 399 Ohm
Lead Channel Impedance Value: 437 Ohm
Lead Channel Impedance Value: 437 Ohm
Lead Channel Impedance Value: 456 Ohm
Lead Channel Impedance Value: 456 Ohm
Lead Channel Impedance Value: 456 Ohm
Lead Channel Impedance Value: 494 Ohm
Lead Channel Impedance Value: 703 Ohm
Lead Channel Impedance Value: 703 Ohm
Lead Channel Impedance Value: 703 Ohm
Lead Channel Impedance Value: 703 Ohm
Lead Channel Impedance Value: 703 Ohm
Lead Channel Pacing Threshold Amplitude: 0.625 V
Lead Channel Pacing Threshold Amplitude: 0.875 V
Lead Channel Pacing Threshold Amplitude: 1.125 V
Lead Channel Pacing Threshold Pulse Width: 0.4 ms
Lead Channel Pacing Threshold Pulse Width: 0.4 ms
Lead Channel Pacing Threshold Pulse Width: 0.4 ms
Lead Channel Sensing Intrinsic Amplitude: 0.875 mV
Lead Channel Sensing Intrinsic Amplitude: 0.875 mV
Lead Channel Sensing Intrinsic Amplitude: 18.5 mV
Lead Channel Sensing Intrinsic Amplitude: 18.5 mV
Lead Channel Setting Pacing Amplitude: 1.5 V
Lead Channel Setting Pacing Amplitude: 1.5 V
Lead Channel Setting Pacing Amplitude: 2.5 V
Lead Channel Setting Pacing Pulse Width: 0.4 ms
Lead Channel Setting Pacing Pulse Width: 0.6 ms
Lead Channel Setting Sensing Sensitivity: 0.3 mV

## 2021-10-29 ENCOUNTER — Encounter: Payer: Self-pay | Admitting: Internal Medicine

## 2021-10-29 ENCOUNTER — Ambulatory Visit (INDEPENDENT_AMBULATORY_CARE_PROVIDER_SITE_OTHER): Payer: Medicare Other | Admitting: Internal Medicine

## 2021-10-29 VITALS — BP 106/72 | HR 64 | Ht 70.0 in | Wt 213.2 lb

## 2021-10-29 DIAGNOSIS — Z95810 Presence of automatic (implantable) cardiac defibrillator: Secondary | ICD-10-CM | POA: Diagnosis not present

## 2021-10-29 DIAGNOSIS — I5022 Chronic systolic (congestive) heart failure: Secondary | ICD-10-CM

## 2021-10-29 DIAGNOSIS — I2510 Atherosclerotic heart disease of native coronary artery without angina pectoris: Secondary | ICD-10-CM | POA: Diagnosis not present

## 2021-10-29 DIAGNOSIS — I4819 Other persistent atrial fibrillation: Secondary | ICD-10-CM

## 2021-10-29 NOTE — Progress Notes (Signed)
HPI [name] returns today for followup. He is a pleasant 66 yo man with a h/o VT/VF, chronic systolic heart failure s/p ICD insertion. The patient has had trouble with lead problems and required recent ICD lead revision. He had an elevated HV lead impedence. He has done well since his lead revision and denies chest pain, sob, or ICD therapies. Overall he feels well and did not have any problems with healing.  No Known Allergies   Current Outpatient Medications  Medication Sig Dispense Refill   acetaminophen (TYLENOL) 325 MG tablet Take 1-2 tablets (325-650 mg total) by mouth every 4 (four) hours as needed for mild pain.     atorvastatin (LIPITOR) 20 MG tablet Take 1 tablet (20 mg total) by mouth at bedtime. 90 tablet 2   carvedilol (COREG) 25 MG tablet Take 1 tablet (25 mg total) by mouth 2 (two) times daily with a meal. 180 tablet 1   furosemide (LASIX) 40 MG tablet TAKE 1 TABLET BY MOUTH DAILY 90 tablet 3   levothyroxine (SYNTHROID) 50 MCG tablet Take 1 tablet (50 mcg total) by mouth daily before breakfast. 90 tablet 1   losartan (COZAAR) 25 MG tablet Take 0.5 tablets (12.5 mg total) by mouth daily. 45 tablet 1   Multiple Vitamins-Minerals (HAIR/SKIN/NAILS PO) Take 1 tablet by mouth daily.     spironolactone (ALDACTONE) 25 MG tablet TAKE 1 TABLET BY MOUTH DAILY. 90 tablet 3   warfarin (COUMADIN) 5 MG tablet TAKE 1/2 TO 1 TABLET BY MOUTH DAILY AS DIRECTED BY COUMADIN CLINIC 90 tablet 0   No current facility-administered medications for this visit.     Past Medical History:  Diagnosis Date   AICD (automatic cardioverter/defibrillator) present    Medtronic   Atrial fibrillation, chronic (HCC)    CAD (coronary artery disease)    a. s/p CABG x 3 03/2013 (L-LAD, S-RI, S-PDA) in [place]   CAP (community acquired pneumonia) 08/22/2015   CHF (congestive heart failure) (HCC)    Chronic combined systolic and diastolic heart failure ([place]) 03/2013   Full dentures    HLD  (hyperlipidemia)     Hyperbilirubinemia    Hypothyroidism    Ischemic cardiomyopathy    a. echo (10/15):  EF 20%, diff HK, apical AK, mobile density inf wall near papillary muscle (?ruptured) with mod MR, mod reduced RVSF, mild PI, PASP 37 mmHg b. s/p CRTD    LBBB (left bundle branch block)     LV (left ventricular) mural thrombus     a. TEE (10/15):  EF 15-20%, septum and ant walls AK, lat wall HK, no papillary muscle rupture, probable small thrombus on ant/ant-septal wall, mild MR, mod decreased RVSF, neg bubble study   Mitral regurgitation     mild by TEE 02/2014   NSVT (nonsustained ventricular tachycardia) (HCC)         Obesity    Pulmonary hypertension ([place])    Umbilical hernia    "have had it since age [age]"   Wears glasses     ROS:   All systems reviewed and negative except as noted in the HPI.   Past Surgical History:  Procedure Laterality Date   BI-VENTRICULAR IMPLANTABLE CARDIOVERTER DEFIBRILLATOR N/A 09/01/2014   MDT CRTD implanted by Dr Lovena Le   CARDIAC CATHETERIZATION  03/2013   CARDIOVERSION N/A 12/04/2017   Procedure: CARDIOVERSION;  Surgeon: Evans Lance, MD;  Location: [place] [facility];  Service: Cardiovascular;  Laterality: N/A;   CORONARY ARTERY BYPASS GRAFT  03/2013   LIMA to the LAD, SVG to the ramus, SVG to the PDA   CORONARY ARTERY BYPASS GRAFT  2014   CABG "X3"   FRACTURE SURGERY     ICD LEAD REMOVAL N/A 08/14/2018   Procedure: ICD LEAD REMOVAL;  Surgeon: Evans Lance, MD;  Location: [facility];  Service: Cardiovascular;  Laterality: N/A;   ICD LEAD REMOVAL N/A 07/24/2021   Procedure: ICD LEAD EXTRACTION AND REPLACEMENT;  Surgeon: Evans Lance, MD;  Location: [place];  Service: Cardiovascular;  Laterality: N/A;   IMPLANTABLE CARDIOVERTER DEFIBRILLATOR (ICD) GENERATOR CHANGE N/A 07/24/2021   Procedure: ICD GENERATOR CHANGE;  Surgeon: Evans Lance, MD;  Location: [place];  Service: Cardiovascular;  Laterality: N/A;   INSERT / REPLACE / REMOVE PACEMAKER   2016   LEAD INSERTION N/A 08/14/2018   Procedure: LEAD INSERTION;  Surgeon: Evans Lance, MD;  Location: [place];  Service: Cardiovascular;  Laterality: N/A;   LEAD REVISION/REPAIR N/A 12/04/2017   Procedure: LEAD REVISION/REPAIR;  Surgeon: Evans Lance, MD;  Location: [place] [facility];  Service: Cardiovascular;  Laterality: N/A;   MULTIPLE TOOTH EXTRACTIONS     PATELLA FRACTURE SURGERY Left 1972   TEE WITHOUT CARDIOVERSION N/A 03/04/2014   Procedure: TRANSESOPHAGEAL ECHOCARDIOGRAM (TEE);  Surgeon: Larey Dresser, MD;  Location: [place];  Service: Cardiovascular;  Laterality: N/A;   TEE WITHOUT CARDIOVERSION N/A 12/02/2017   Procedure: TRANSESOPHAGEAL ECHOCARDIOGRAM (TEE);  Surgeon: Buford Dresser, MD;  Location: [facility];  Service: Cardiovascular;  Laterality: N/A;   TEE WITHOUT CARDIOVERSION N/A 07/24/2021   Procedure: TRANSESOPHAGEAL ECHOCARDIOGRAM (TEE);  Surgeon: Evans Lance, MD;  [facility];  Service: Cardiovascular;  Laterality: N/A;     Family History  Problem Relation Age of Onset   Diabetes Mother        Deceased   Thyroid disease Mother      Social History   Socioeconomic History   Marital status: Divorced    Spouse name: Not on file   Number of children: Not on file   Years of education: Not on file   Highest education level: Not on file  Occupational History   Not on file  Tobacco Use   Smoking status: Every Day    Packs/day: 0.25    Years: 47.00    Total pack years: 11.75    Types: Cigarettes   Smokeless tobacco: Never  Vaping Use   Vaping Use: Never used  Substance and Sexual Activity   Alcohol use: Not Currently    Alcohol/week: 0.0 standard drinks of alcohol    Comment: "quit drinking when I was 23"   Drug use: Yes    Types: Marijuana    Comment: Last use was on 07/22/21.   Sexual activity: Not Currently  Other Topics Concern   Not on file  Social History Narrative   ** Merged History Encounter **       Social  Determinants of Health   Financial Resource Strain: Not on file  Food Insecurity: Not on file  Transportation Needs: Not on file  Physical Activity: Not on file  Stress: Not on file  Social Connections: Not on file  Intimate Partner Violence: Not on file     BP 106/72   Pulse 64   Ht 5\' 10"  (1.778 m)   Wt 213 lb 3.2 oz (96.7 kg)   SpO2 95%   BMI 30.59 kg/m   Physical Exam:  Well appearing NAD HEENT: Unremarkable Neck:  No  JVD, no thyromegally Lymphatics:  No adenopathy Back:  No CVA tenderness Lungs:  Clear with no wheezes.  HEART:  Regular rate rhythm, no murmurs, no rubs, no clicks Abd:  soft, positive bowel sounds, no organomegally, no rebound, no guarding Ext:  2 plus pulses, no edema, no cyanosis, no clubbing Skin:  No rashes no nodules Neuro:  CN II through XII intact, motor grossly intact  DEVICE  Normal device function.  See PaceArt for details.   Assess/Plan:  ICD lead failure - he is s/p revision and his device is working normally.  Chronic systolic heart failure - his symptoms are class 2A. He will continue his current GDMT. VT - he has not had more VT since his lead revision. He will continue his current meds.  PAF - he has not had more atrial fib. Continue current meds. Carleene Overlie Kalyse Meharg,MD

## 2021-10-29 NOTE — Patient Instructions (Signed)
Medication Instructions:  Your physician recommends that you continue on your current medications as directed. Please refer to the Current Medication list given to you today.  Labwork: None ordered.  Testing/Procedures: None ordered.  Follow-Up: Your physician wants you to follow-up in: one year with Lewayne Bunting, MD or one of the following Advanced Practice Providers on your designated Care Team:   Francis Dowse, [place] Sacha "Mardelle Matte" Lanna Poche, [place]  Remote monitoring is used to monitor your ICD from home. This monitoring reduces the number of office visits required to check your device to one time per year. It allows [place] to keep an eye on the functioning of your device to ensure it is working properly. You are scheduled for a device check from home on 01/24/2022. You may send your transmission at any time that day. If you have a wireless device, the transmission will be sent automatically. After your physician reviews your transmission, you will receive a postcard with your next transmission date.  Any Other Special Instructions Will Be Listed Below (If Applicable).  If you need a refill on your cardiac medications before your next appointment, please call your pharmacy.   Important Information About Sugar

## 2021-11-02 NOTE — Progress Notes (Signed)
Remote ICD transmission.   

## 2021-11-13 ENCOUNTER — Ambulatory Visit (INDEPENDENT_AMBULATORY_CARE_PROVIDER_SITE_OTHER): Payer: Medicare Other

## 2021-11-13 DIAGNOSIS — Z5181 Encounter for therapeutic drug level monitoring: Secondary | ICD-10-CM | POA: Diagnosis not present

## 2021-11-13 DIAGNOSIS — I513 Intracardiac thrombosis, not elsewhere classified: Secondary | ICD-10-CM

## 2021-11-13 LAB — POCT INR: INR: 2.8 (ref 2.0–3.0)

## 2021-11-29 ENCOUNTER — Other Ambulatory Visit: Payer: Self-pay | Admitting: Internal Medicine

## 2021-11-29 NOTE — Telephone Encounter (Signed)
Pt pharmacy is requesting a refill on levothyroxine 50 mcg tablets. Would Dr. Ladona Ridgel like to refill this medication? Please address

## 2021-11-30 ENCOUNTER — Ambulatory Visit (INDEPENDENT_AMBULATORY_CARE_PROVIDER_SITE_OTHER): Payer: Medicare Other

## 2021-11-30 DIAGNOSIS — Z0000 Encounter for general adult medical examination without abnormal findings: Secondary | ICD-10-CM | POA: Diagnosis not present

## 2021-11-30 NOTE — Progress Notes (Signed)
Subjective:   Randie Tallarico is a 66 y.o. male who presents for an Initial Medicare Annual Wellness Visit. I connected with  Stann Ore on 11/30/21 by a audio enabled telemedicine application and verified that I am speaking with the correct person using two identifiers.  Patient Location: Home  Provider Location: Office/Clinic  I discussed the limitations of evaluation and management by telemedicine. The patient expressed understanding and agreed to proceed.   Review of Systems     DEFERRED TO PCP        Objective:    There were no vitals filed for this visit. There is no height or weight on file to calculate BMI.     07/24/2021   10:26 PM 07/18/2021    9:24 AM 06/27/2021    6:37 PM 06/15/2021    9:21 AM 03/26/2021   10:51 AM 12/14/2020    3:07 PM 11/30/2020   10:55 PM  Advanced Directives  Does Patient Have a Medical Advance Directive? No No No No No No No  Would patient like information on creating a medical advance directive? No - Patient declined No - Patient declined No - Patient declined No - Patient declined No - Patient declined No - Patient declined No - Patient declined    Current Medications (verified) Outpatient Encounter Medications as of 11/30/2021  Medication Sig   acetaminophen (TYLENOL) 325 MG tablet Take 1-2 tablets (325-650 mg total) by mouth every 4 (four) hours as needed for mild pain.   atorvastatin (LIPITOR) 20 MG tablet Take 1 tablet (20 mg total) by mouth at bedtime.   carvedilol (COREG) 25 MG tablet Take 1 tablet (25 mg total) by mouth 2 (two) times daily with a meal.   furosemide (LASIX) 40 MG tablet TAKE 1 TABLET BY MOUTH DAILY   levothyroxine (SYNTHROID) 50 MCG tablet TAKE 1 TABLET BY MOUTH DAILY BEFORE BREAKFAST.   losartan (COZAAR) 25 MG tablet Take 0.5 tablets (12.5 mg total) by mouth daily.   Multiple Vitamins-Minerals (HAIR/SKIN/NAILS PO) Take 1 tablet by mouth daily.   spironolactone (ALDACTONE) 25 MG tablet TAKE 1 TABLET BY  MOUTH DAILY.   warfarin (COUMADIN) 5 MG tablet TAKE 1/2 TO 1 TABLET BY MOUTH DAILY AS DIRECTED BY COUMADIN CLINIC   No facility-administered encounter medications on file as of 11/30/2021.    Allergies (verified) Patient has no known allergies.   History: Past Medical History:  Diagnosis Date   AICD (automatic cardioverter/defibrillator) present    Medtronic   Atrial fibrillation, chronic (HCC)    CAD (coronary artery disease)    a. s/p CABG x 3 03/2013 (L-LAD, S-RI, S-PDA) in [place]   CAP (community acquired pneumonia) 08/22/2015   CHF (congestive heart failure) (HCC)    Chronic combined systolic and diastolic heart failure (HCC) 03/2013   Full dentures    HLD (hyperlipidemia)     Hyperbilirubinemia    Hypothyroidism    Ischemic cardiomyopathy    a. echo (10/15):  EF 20%, diff HK, apical AK, mobile density inf wall near papillary muscle (?ruptured) with mod MR, mod reduced RVSF, mild PI, PASP 37 mmHg b. s/p CRTD    LBBB (left bundle branch block)     LV (left ventricular) mural thrombus     a. TEE (10/15):  EF 15-20%, septum and ant walls AK, lat wall HK, no papillary muscle rupture, probable small thrombus on ant/ant-septal wall, mild MR, mod decreased RVSF, neg bubble study   Mitral regurgitation     mild by  TEE 02/2014   NSVT (nonsustained ventricular tachycardia) (HCC)         Obesity    Pulmonary hypertension (HCC)    Umbilical hernia    "have had it since age [age]"   Wears glasses    Past Surgical History:  Procedure Laterality Date   BI-VENTRICULAR IMPLANTABLE CARDIOVERTER DEFIBRILLATOR N/A 09/01/2014   MDT CRTD implanted by Dr Ladona Ridgelaylor   CARDIAC CATHETERIZATION  03/2013   CARDIOVERSION N/A 12/04/2017   Procedure: CARDIOVERSION;  Surgeon: Marinus Mawaylor, Gregg W, MD;  [facility];  Service: Cardiovascular;  Laterality: N/A;   CORONARY ARTERY BYPASS GRAFT  03/2013   LIMA to the LAD, SVG to the ramus, SVG to the PDA   CORONARY ARTERY BYPASS GRAFT  2014   CABG  "X3"   FRACTURE SURGERY     ICD LEAD REMOVAL N/A 08/14/2018   Procedure: ICD LEAD REMOVAL;  Surgeon: Marinus Mawaylor, Gregg W, MD;  [facility];  Service: Cardiovascular;  Laterality: N/A;   ICD LEAD REMOVAL N/A 07/24/2021   Procedure: ICD LEAD EXTRACTION AND REPLACEMENT;  Surgeon: Marinus Mawaylor, Gregg W, MD;  [facility];  Service: Cardiovascular;  Laterality: N/A;   IMPLANTABLE CARDIOVERTER DEFIBRILLATOR (ICD) GENERATOR CHANGE N/A 07/24/2021   Procedure: ICD GENERATOR CHANGE;  Surgeon: Marinus Mawaylor, Gregg W, MD;  [facility];  Service: Cardiovascular;  Laterality: N/A;   INSERT / REPLACE / REMOVE PACEMAKER  2016   LEAD INSERTION N/A 08/14/2018   Procedure: LEAD INSERTION;  Surgeon: Marinus Mawaylor, Gregg W, MD;  [facility];  Service: Cardiovascular;  Laterality: N/A;   LEAD REVISION/REPAIR N/A 12/04/2017   Procedure: LEAD REVISION/REPAIR;  Surgeon: Marinus Mawaylor, Gregg W, MD;  [facility];  Service: Cardiovascular;  Laterality: N/A;   MULTIPLE TOOTH EXTRACTIONS     PATELLA FRACTURE SURGERY Left 1972   TEE WITHOUT CARDIOVERSION N/A 03/04/2014   Procedure: TRANSESOPHAGEAL ECHOCARDIOGRAM (TEE);  Surgeon: Laurey Moralealton S McLean, MD;  [facility];  Service: Cardiovascular;  Laterality: N/A;   TEE WITHOUT CARDIOVERSION N/A 12/02/2017   Procedure: TRANSESOPHAGEAL ECHOCARDIOGRAM (TEE);  Surgeon: Jodelle Redhristopher, Bridgette, MD;  [facility];  Service: Cardiovascular;  Laterality: N/A;   TEE WITHOUT CARDIOVERSION N/A 07/24/2021   Procedure: TRANSESOPHAGEAL ECHOCARDIOGRAM (TEE);  Surgeon: Marinus Mawaylor, Gregg W, MD;  [facility];  Service: Cardiovascular;  Laterality: N/A;   Family History  Problem Relation Age of Onset   Diabetes Mother        Deceased   Thyroid disease Mother    Social History   Socioeconomic History   Marital status: Divorced    Spouse name: Not on file   Number of children: Not on file   Years of education: Not on file   Highest education level: Not on file  Occupational  History   Not on file  Tobacco Use   Smoking status: Every Day    Packs/day: 0.25    Years: 47.00    Total pack years: 11.75    Types: Cigarettes   Smokeless tobacco: Never  Vaping Use   Vaping Use: Never used  Substance and Sexual Activity   Alcohol use: Not Currently    Alcohol/week: 0.0 standard drinks of alcohol    Comment: "quit drinking when I was 23"   Drug use: Yes    Types: Marijuana    Comment: Last use was on 07/22/21.   Sexual activity: Not Currently  Other Topics Concern   Not on file  Social History Narrative   ** Merged History  Encounter **       Social Determinants of Health   Financial Resource Strain: Not on file  Food Insecurity: Not on file  Transportation Needs: Not on file  Physical Activity: Not on file  Stress: Not on file  Social Connections: Not on file    Tobacco Counseling Ready to quit: Not Answered Counseling given: Not Answered   Clinical Intake:                 Diabetic?  No          Activities of Daily Living    07/24/2021   10:28 PM 07/24/2021   10:27 PM  In your present state of health, do you have any difficulty performing the following activities:  Hearing?  0  Vision?  0  Difficulty concentrating or making decisions?  0  Walking or climbing stairs?  0  Dressing or bathing?  0  Doing errands, shopping? 0     Patient Care Team: Rudene Christians, DO as PCP - General (Internal Medicine) Marinus Maw, MD as PCP - Electrophysiology (Cardiology) Laurey Morale, MD as PCP - Advanced Heart Failure (Cardiology) Doris Cheadle, MD (Internal Medicine)  Indicate any recent Medical Services you may have received from other than [facility] providers in the past year (date may be approximate).     Assessment:   This is a routine wellness examination for [place].  Hearing/Vision screen No results found.  Dietary issues and exercise activities discussed:     Goals Addressed   None   Depression Screen     07/18/2021   10:33 AM 06/15/2021   12:26 PM 12/14/2020    3:40 PM 10/04/2015    3:33 PM 08/28/2015    3:15 PM 03/09/2014   10:10 AM  PHQ 2/9 Scores  PHQ - 2 Score 0 0 0 0 0 0  PHQ- 9 Score 1  3       Fall Risk    07/18/2021    9:23 AM 06/15/2021    9:24 AM 12/14/2020    3:06 PM 10/04/2015    3:33 PM 08/28/2015    3:15 PM  Fall Risk   Falls in the past year? 1 0 0 No No  Number falls in past yr: 0 0     Injury with Fall? 0 0     Risk for fall due to : History of fall(s) No Fall Risks No Fall Risks Medication side effect   Follow up Falls evaluation completed Falls evaluation completed Falls prevention discussed      FALL RISK PREVENTION PERTAINING TO THE HOME:  Any stairs in or around the home? Yes  If so, are there any without handrails? No  Home free of loose throw rugs in walkways, pet beds, electrical cords, etc? No  Adequate lighting in your home to reduce risk of falls? Yes   ASSISTIVE DEVICES UTILIZED TO PREVENT FALLS:  Life alert? No  Use of a cane, walker or w/c? No  Grab bars in the bathroom? No  Shower chair or bench in shower? No  Elevated toilet seat or a handicapped toilet? No   TIMED UP AND GO:  Was the test performed? No .  Length of time to ambulate 10 feet:  N/A sec.     Cognitive Function:        Immunizations  There is no immunization history on file for this patient.  TDAP status: Due, Education has been provided regarding the importance of this vaccine. Advised  may receive this vaccine at local pharmacy or Health Dept. Aware to provide a copy of the vaccination record if obtained from local pharmacy or Health Dept. Verbalized acceptance and understanding.  Flu Vaccine status: Up to date  Pneumococcal vaccine status: Due, Education has been provided regarding the importance of this vaccine. Advised may receive this vaccine at local pharmacy or Health Dept. Aware to provide a copy of the vaccination record if obtained from local pharmacy or Health  Dept. Verbalized acceptance and understanding.  Covid-19 vaccine status: Declined, Education has been provided regarding the importance of this vaccine but patient still declined. Advised may receive this vaccine at local pharmacy or Health Dept.or vaccine clinic. Aware to provide a copy of the vaccination record if obtained from local pharmacy or Health Dept. Verbalized acceptance and understanding.  Qualifies for Shingles Vaccine? Yes   Zostavax completed No   Shingrix Completed?: No.    Education has been provided regarding the importance of this vaccine. Patient has been advised to call insurance company to determine out of pocket expense if they have not yet received this vaccine. Advised may also receive vaccine at local pharmacy or Health Dept. Verbalized acceptance and understanding.  Screening Tests Health Maintenance  Topic Date Due   COVID-19 Vaccine (1) Never done   Hepatitis C Screening  Never done   TETANUS/TDAP  Never done   Zoster Vaccines- Shingrix (1 of 2) Never done   COLONOSCOPY (Pts 45-67yrs Insurance coverage will need to be confirmed)  Never done   COLON CANCER SCREENING ANNUAL FOBT  10/17/2016   Pneumonia Vaccine 68+ Years old (1 - PCV) 06/15/2022 (Originally 07/29/1961)   INFLUENZA VACCINE  12/18/2021   HPV VACCINES  Aged Out    Health Maintenance  Health Maintenance Due  Topic Date Due   COVID-19 Vaccine (1) Never done   Hepatitis C Screening  Never done   TETANUS/TDAP  Never done   Zoster Vaccines- Shingrix (1 of 2) Never done   COLONOSCOPY (Pts 45-47yrs Insurance coverage will need to be confirmed)  Never done   COLON CANCER SCREENING ANNUAL FOBT  10/17/2016    COLON CANCER SCREENING : DEFERRED TO PCP   Lung Cancer Screening: (Low Dose CT Chest recommended if Age 39-80 years, 30 pack-year currently smoking OR have quit w/in 15years.) does not qualify.   Lung Cancer Screening Referral:  DEFERRED TO PCP  Additional Screening:  Hepatitis C Screening:  does qualify; Completed  DEFERRED TO PCP   Vision Screening: Recommended annual ophthalmology exams for early detection of glaucoma and other disorders of the eye. Is the patient up to date with their annual eye exam?  no  Who is the provider or what is the name of the office in which the patient attends annual eye exams?  Pt stated his eye dr was at the [facility] but he has retired  If pt is not established with a provider, would they like to be referred to a provider to establish care? No .   Dental Screening: Recommended annual dental exams for proper oral hygiene  Community Resource Referral / Chronic Care Management: CRR required this visit?  No   CCM required this visit?  No      Plan:     I have personally reviewed and noted the following in the patient's chart:   Medical and social history Use of alcohol, tobacco or illicit drugs  Current medications and supplements including opioid prescriptions. Patient is not currently taking opioid prescriptions. Functional ability  and status Nutritional status Physical activity Advanced directives List of other physicians Hospitalizations, surgeries, and ER visits in previous 12 months Vitals Screenings to include cognitive, depression, and falls Referrals and appointments  In addition, I have reviewed and discussed with patient certain preventive protocols, quality metrics, and best practice recommendations. A written personalized care plan for preventive services as well as general preventive health recommendations were provided to patient.     Derrell Lolling, CMA   11/30/2021   Nurse Notes:  NON FACE  TO FACE     Mr. Wahlert , Thank you for taking time to come for your Medicare Wellness Visit. I appreciate your ongoing commitment to your health goals. Please review the following plan we discussed and let me know if I can assist you in the future.   These are the goals we discussed:  Goals   None     This is a list of  the screening recommended for you and due dates:  Health Maintenance  Topic Date Due   COVID-19 Vaccine (1) Never done   Hepatitis C Screening: USPSTF Recommendation to screen - Ages 36-79 yo.  Never done   Tetanus Vaccine  Never done   Zoster (Shingles) Vaccine (1 of 2) Never done   Colon Cancer Screening  Never done   Stool Blood Test  10/17/2016   Pneumonia Vaccine (1 - PCV) 06/15/2022*   Flu Shot  12/18/2021   HPV Vaccine  Aged Out  *Topic was postponed. The date shown is not the original due date.

## 2021-11-30 NOTE — Patient Instructions (Signed)
Health Maintenance, Male Adopting a healthy lifestyle and getting preventive care are important in promoting health and wellness. Ask your health care provider about: The right schedule for you to have regular tests and exams. Things you can do on your own to prevent diseases and keep yourself healthy. What should I know about diet, weight, and exercise? Eat a healthy diet  Eat a diet that includes plenty of vegetables, fruits, low-fat dairy products, and lean protein. Do not eat a lot of foods that are high in solid fats, added sugars, or sodium. Maintain a healthy weight Body mass index (BMI) is a measurement that can be used to identify possible weight problems. It estimates body fat based on height and weight. Your health care provider can help determine your BMI and help you achieve or maintain a healthy weight. Get regular exercise Get regular exercise. This is one of the most important things you can do for your health. Most adults should: Exercise for at least 150 minutes each week. The exercise should increase your heart rate and make you sweat (moderate-intensity exercise). Do strengthening exercises at least twice a week. This is in addition to the moderate-intensity exercise. Spend less time sitting. Even light physical activity can be beneficial. Watch cholesterol and blood lipids Have your blood tested for lipids and cholesterol at 66 years of age, then have this test every 5 years. You may need to have your cholesterol levels checked more often if: Your lipid or cholesterol levels are high. You are older than 66 years of age. You are at high risk for heart disease. What should I know about cancer screening? Many types of cancers can be detected early and may often be prevented. Depending on your health history and family history, you may need to have cancer screening at various ages. This may include screening for: Colorectal cancer. Prostate cancer. Skin cancer. Lung  cancer. What should I know about heart disease, diabetes, and high blood pressure? Blood pressure and heart disease High blood pressure causes heart disease and increases the risk of stroke. This is more likely to develop in people who have high blood pressure readings or are overweight. Talk with your health care provider about your target blood pressure readings. Have your blood pressure checked: Every 3-5 years if you are 18-39 years of age. Every year if you are 40 years old or older. If you are between the ages of 65 and 75 and are a current or former smoker, ask your health care provider if you should have a one-time screening for abdominal aortic aneurysm (AAA). Diabetes Have regular diabetes screenings. This checks your fasting blood sugar level. Have the screening done: Once every three years after age 45 if you are at a normal weight and have a low risk for diabetes. More often and at a younger age if you are overweight or have a high risk for diabetes. What should I know about preventing infection? Hepatitis B If you have a higher risk for hepatitis B, you should be screened for this virus. Talk with your health care provider to find out if you are at risk for hepatitis B infection. Hepatitis C Blood testing is recommended for: Everyone born from 1945 through 1965. Anyone with known risk factors for hepatitis C. Sexually transmitted infections (STIs) You should be screened each year for STIs, including gonorrhea and chlamydia, if: You are sexually active and are younger than 66 years of age. You are older than 66 years of age and your   health care provider tells you that you are at risk for this type of infection. Your sexual activity has changed since you were last screened, and you are at increased risk for chlamydia or gonorrhea. Ask your health care provider if you are at risk. Ask your health care provider about whether you are at high risk for HIV. Your health care provider  may recommend a prescription medicine to help prevent HIV infection. If you choose to take medicine to prevent HIV, you should first get tested for HIV. You should then be tested every 3 months for as long as you are taking the medicine. Follow these instructions at home: Alcohol use Do not drink alcohol if your health care provider tells you not to drink. If you drink alcohol: Limit how much you have to 0-2 drinks a day. Know how much alcohol is in your drink. In the [place] one drink equals one 12 oz bottle of beer (355 mL), one 5 oz glass of wine (148 mL), or one 1 oz glass of hard liquor (44 mL). Lifestyle Do not use any products that contain nicotine or tobacco. These products include cigarettes, chewing tobacco, and vaping devices, such as e-cigarettes. If you need help quitting, ask your health care provider. Do not use street drugs. Do not share needles. Ask your health care provider for help if you need support or information about quitting drugs. General instructions Schedule regular health, dental, and eye exams. Stay current with your vaccines. Tell your health care provider if: You often feel depressed. You have ever been abused or do not feel safe at home. Summary Adopting a healthy lifestyle and getting preventive care are important in promoting health and wellness. Follow your health care provider's instructions about healthy diet, exercising, and getting tested or screened for diseases. Follow your health care provider's instructions on monitoring your cholesterol and blood pressure. This information is not intended to replace advice given to you by your health care provider. Make sure you discuss any questions you have with your health care provider. Document Revised: 09/25/2020 Document Reviewed: 09/25/2020 Elsevier Patient Education  2023 [employer]  

## 2021-12-06 ENCOUNTER — Telehealth: Payer: Self-pay | Admitting: Physician Assistant

## 2021-12-06 NOTE — Telephone Encounter (Signed)
"  Things have become pretty volatile" where he lives, with his dtr.  He said it is not physical but she is making life miserable. Pt wanted to personally thank Dr. Ladona Ridgel and Francis Dowse for everything they have done for him.  He really appreciates them. Pt is going to be moving back to [place] with his other children. Wished pt the best. Informed pt that he can send a mychart message to both providers to express his gratitude, but aware I am sending this message to them both. Pt thanks me for the return call.

## 2021-12-06 NOTE — Telephone Encounter (Signed)
  Pt requesting to speak with Joseph Carson. I asked for the reason of the call, pt said, he "I just need to speak with her, it's not an emergency, so whenever she's available".

## 2021-12-07 ENCOUNTER — Other Ambulatory Visit: Payer: Self-pay | Admitting: Internal Medicine

## 2021-12-07 ENCOUNTER — Other Ambulatory Visit: Payer: Self-pay | Admitting: Cardiology

## 2021-12-07 DIAGNOSIS — I513 Intracardiac thrombosis, not elsewhere classified: Secondary | ICD-10-CM

## 2021-12-07 MED ORDER — CARVEDILOL 25 MG PO TABS: 25.0000 mg | ORAL_TABLET | Freq: Two times a day (BID) | ORAL | 3 refills | Status: AC

## 2021-12-07 MED ORDER — LOSARTAN POTASSIUM 25 MG PO TABS: 12.5000 mg | ORAL_TABLET | Freq: Every day | ORAL | 3 refills | Status: AC

## 2021-12-11 ENCOUNTER — Ambulatory Visit (INDEPENDENT_AMBULATORY_CARE_PROVIDER_SITE_OTHER): Payer: Medicare Other

## 2021-12-11 DIAGNOSIS — Z5181 Encounter for therapeutic drug level monitoring: Secondary | ICD-10-CM

## 2021-12-11 LAB — POCT INR: INR: 3.8 — AB (ref 2.0–3.0)

## 2021-12-11 NOTE — Patient Instructions (Signed)
Description   Hold today's dose and then continue taking 1 tablet (5mg ) daily EXCEPT 0.5 tablet (2.5mg ) on Monday, Wednesdays, and Fridays.  Recheck INR in 3 weeks.  Call [facility] if placed on any new medications or scheduled for any procedures - [phone];   Amio discontinued.

## 2021-12-17 ENCOUNTER — Other Ambulatory Visit: Payer: Medicare Other

## 2021-12-28 NOTE — Progress Notes (Signed)
[facility] Clinic Attending  Case and documentation reviewed.  I reviewed the AWV findings.  I agree with the assessment, diagnosis, and plan of care documented in the AWV note.     

## 2022-01-08 ENCOUNTER — Telehealth: Payer: Self-pay

## 2022-01-08 NOTE — Telephone Encounter (Signed)
Pt overdue to have INR checked. Called pt, no answer. Called and spoke with pt's daughter, Misty Stanley, who stated pt recently moved to [place] and provided me with her sister's phone number who would know if pt has been established with new cardiologist. Jeanene Erb, no answer, left message to call back as soon as possible.

## 2022-01-09 NOTE — Telephone Encounter (Signed)
Pt returning your call

## 2022-01-09 NOTE — Telephone Encounter (Signed)
Called and spoke with pt. He has not found a cardiologist yet in [place]; however pt stated he just got his insurance set up and will follow up with new PCP to have INR checked.  Joseph Carson was very appreciative for all the care he received from [facility].

## 2022-01-18 ENCOUNTER — Other Ambulatory Visit: Payer: Medicare Other

## 2022-01-24 ENCOUNTER — Ambulatory Visit (INDEPENDENT_AMBULATORY_CARE_PROVIDER_SITE_OTHER): Payer: Medicare Other

## 2022-01-24 DIAGNOSIS — I447 Left bundle-branch block, unspecified: Secondary | ICD-10-CM

## 2022-01-24 LAB — CUP PACEART REMOTE DEVICE CHECK
Battery Remaining Longevity: 92 mo
Battery Voltage: 3.03 V
Brady Statistic AP VP Percent: 29.5 %
Brady Statistic AP VS Percent: 0.07 %
Brady Statistic AS VP Percent: 69.88 %
Brady Statistic AS VS Percent: 0.56 %
Brady Statistic RA Percent Paced: 28.74 %
Brady Statistic RV Percent Paced: 94.69 %
Date Time Interrogation Session: 20230907001805
HighPow Impedance: 63 Ohm
Implantable Lead Implant Date: 20160414
Implantable Lead Implant Date: 20160414
Implantable Lead Implant Date: 20230307
Implantable Lead Location: 753858
Implantable Lead Location: 753859
Implantable Lead Location: 753860
Implantable Lead Model: 138
Implantable Lead Model: 4398
Implantable Lead Model: 5076
Implantable Lead Serial Number: 302991
Implantable Pulse Generator Implant Date: 20230307
Lead Channel Impedance Value: 184.154
Lead Channel Impedance Value: 184.154
Lead Channel Impedance Value: 191.854
Lead Channel Impedance Value: 199.5 Ohm
Lead Channel Impedance Value: 208.568
Lead Channel Impedance Value: 342 Ohm
Lead Channel Impedance Value: 342 Ohm
Lead Channel Impedance Value: 399 Ohm
Lead Channel Impedance Value: 399 Ohm
Lead Channel Impedance Value: 399 Ohm
Lead Channel Impedance Value: 437 Ohm
Lead Channel Impedance Value: 456 Ohm
Lead Channel Impedance Value: 456 Ohm
Lead Channel Impedance Value: 665 Ohm
Lead Channel Impedance Value: 703 Ohm
Lead Channel Impedance Value: 703 Ohm
Lead Channel Impedance Value: 703 Ohm
Lead Channel Impedance Value: 703 Ohm
Lead Channel Pacing Threshold Amplitude: 0.625 V
Lead Channel Pacing Threshold Amplitude: 0.75 V
Lead Channel Pacing Threshold Amplitude: 1.25 V
Lead Channel Pacing Threshold Pulse Width: 0.4 ms
Lead Channel Pacing Threshold Pulse Width: 0.4 ms
Lead Channel Pacing Threshold Pulse Width: 0.4 ms
Lead Channel Sensing Intrinsic Amplitude: 0.875 mV
Lead Channel Sensing Intrinsic Amplitude: 0.875 mV
Lead Channel Sensing Intrinsic Amplitude: 16.25 mV
Lead Channel Sensing Intrinsic Amplitude: 16.25 mV
Lead Channel Setting Pacing Amplitude: 1.5 V
Lead Channel Setting Pacing Amplitude: 1.5 V
Lead Channel Setting Pacing Amplitude: 2 V
Lead Channel Setting Pacing Pulse Width: 0.4 ms
Lead Channel Setting Pacing Pulse Width: 0.6 ms
Lead Channel Setting Sensing Sensitivity: 0.3 mV

## 2022-02-09 NOTE — Progress Notes (Signed)
Remote ICD transmission.   

## 2022-04-25 ENCOUNTER — Ambulatory Visit (INDEPENDENT_AMBULATORY_CARE_PROVIDER_SITE_OTHER): Payer: Medicare Other

## 2022-04-25 DIAGNOSIS — I447 Left bundle-branch block, unspecified: Secondary | ICD-10-CM | POA: Diagnosis not present

## 2022-04-25 LAB — CUP PACEART REMOTE DEVICE CHECK
Battery Remaining Longevity: 94 mo
Battery Voltage: 3.02 V
Brady Statistic AP VP Percent: 36.19 %
Brady Statistic AP VS Percent: 0.06 %
Brady Statistic AS VP Percent: 63.37 %
Brady Statistic AS VS Percent: 0.38 %
Brady Statistic RA Percent Paced: 34.64 %
Brady Statistic RV Percent Paced: 92.99 %
Date Time Interrogation Session: 20231207001803
HighPow Impedance: 74 Ohm
Implantable Lead Connection Status: 753985
Implantable Lead Connection Status: 753985
Implantable Lead Connection Status: 753985
Implantable Lead Implant Date: 20160414
Implantable Lead Implant Date: 20160414
Implantable Lead Implant Date: 20230307
Implantable Lead Location: 753858
Implantable Lead Location: 753859
Implantable Lead Location: 753860
Implantable Lead Model: 138
Implantable Lead Model: 4398
Implantable Lead Model: 5076
Implantable Lead Serial Number: 302991
Implantable Pulse Generator Implant Date: 20230307
Lead Channel Impedance Value: 212.8 Ohm
Lead Channel Impedance Value: 212.8 Ohm
Lead Channel Impedance Value: 212.8 Ohm
Lead Channel Impedance Value: 228 Ohm
Lead Channel Impedance Value: 228 Ohm
Lead Channel Impedance Value: 399 Ohm
Lead Channel Impedance Value: 437 Ohm
Lead Channel Impedance Value: 437 Ohm
Lead Channel Impedance Value: 456 Ohm
Lead Channel Impedance Value: 456 Ohm
Lead Channel Impedance Value: 456 Ohm
Lead Channel Impedance Value: 456 Ohm
Lead Channel Impedance Value: 494 Ohm
Lead Channel Impedance Value: 722 Ohm
Lead Channel Impedance Value: 722 Ohm
Lead Channel Impedance Value: 760 Ohm
Lead Channel Impedance Value: 760 Ohm
Lead Channel Impedance Value: 779 Ohm
Lead Channel Pacing Threshold Amplitude: 0.625 V
Lead Channel Pacing Threshold Amplitude: 0.875 V
Lead Channel Pacing Threshold Amplitude: 1.25 V
Lead Channel Pacing Threshold Pulse Width: 0.4 ms
Lead Channel Pacing Threshold Pulse Width: 0.4 ms
Lead Channel Pacing Threshold Pulse Width: 0.4 ms
Lead Channel Sensing Intrinsic Amplitude: 1 mV
Lead Channel Sensing Intrinsic Amplitude: 1 mV
Lead Channel Sensing Intrinsic Amplitude: 16.375 mV
Lead Channel Sensing Intrinsic Amplitude: 16.375 mV
Lead Channel Setting Pacing Amplitude: 1.5 V
Lead Channel Setting Pacing Amplitude: 1.5 V
Lead Channel Setting Pacing Amplitude: 1.75 V
Lead Channel Setting Pacing Pulse Width: 0.4 ms
Lead Channel Setting Pacing Pulse Width: 0.6 ms
Lead Channel Setting Sensing Sensitivity: 0.3 mV
Zone Setting Status: 755011

## 2022-05-17 NOTE — Progress Notes (Signed)
Remote ICD transmission.   

## 2022-06-19 ENCOUNTER — Telehealth: Payer: Self-pay

## 2022-06-19 NOTE — Telephone Encounter (Signed)
Pt transferred to [facility].

## 2022-08-20 NOTE — Telephone Encounter (Signed)
Encounter opened in error

## 2023-09-25 LAB — COLOGUARD: COLOGUARD: NEGATIVE
